# Patient Record
Sex: Female | Born: 1960 | Race: White | Hispanic: No | Marital: Single | Smoking: Current every day smoker
Health system: Southern US, Community
[De-identification: ages and names within clinical notes are randomized; demographics above are authoritative.]

## PROBLEM LIST (undated history)

## (undated) DIAGNOSIS — F1911 Other psychoactive substance abuse, in remission: Secondary | ICD-10-CM

## (undated) DIAGNOSIS — C801 Malignant (primary) neoplasm, unspecified: Secondary | ICD-10-CM

## (undated) DIAGNOSIS — F419 Anxiety disorder, unspecified: Secondary | ICD-10-CM

## (undated) DIAGNOSIS — K219 Gastro-esophageal reflux disease without esophagitis: Secondary | ICD-10-CM

## (undated) DIAGNOSIS — K5730 Diverticulosis of large intestine without perforation or abscess without bleeding: Secondary | ICD-10-CM

## (undated) DIAGNOSIS — Z86010 Personal history of colonic polyps: Secondary | ICD-10-CM

## (undated) DIAGNOSIS — F319 Bipolar disorder, unspecified: Secondary | ICD-10-CM

## (undated) DIAGNOSIS — Z8619 Personal history of other infectious and parasitic diseases: Secondary | ICD-10-CM

## (undated) DIAGNOSIS — J42 Unspecified chronic bronchitis: Secondary | ICD-10-CM

## (undated) DIAGNOSIS — F259 Schizoaffective disorder, unspecified: Secondary | ICD-10-CM

## (undated) DIAGNOSIS — F99 Mental disorder, not otherwise specified: Secondary | ICD-10-CM

## (undated) DIAGNOSIS — I639 Cerebral infarction, unspecified: Secondary | ICD-10-CM

## (undated) DIAGNOSIS — I69398 Other sequelae of cerebral infarction: Secondary | ICD-10-CM

## (undated) DIAGNOSIS — Z860101 Personal history of adenomatous and serrated colon polyps: Secondary | ICD-10-CM

## (undated) DIAGNOSIS — G894 Chronic pain syndrome: Secondary | ICD-10-CM

## (undated) DIAGNOSIS — Z8709 Personal history of other diseases of the respiratory system: Secondary | ICD-10-CM

## (undated) DIAGNOSIS — F411 Generalized anxiety disorder: Secondary | ICD-10-CM

## (undated) DIAGNOSIS — B182 Chronic viral hepatitis C: Secondary | ICD-10-CM

## (undated) DIAGNOSIS — R0600 Dyspnea, unspecified: Secondary | ICD-10-CM

## (undated) DIAGNOSIS — I69354 Hemiplegia and hemiparesis following cerebral infarction affecting left non-dominant side: Secondary | ICD-10-CM

## (undated) DIAGNOSIS — B20 Human immunodeficiency virus [HIV] disease: Secondary | ICD-10-CM

## (undated) DIAGNOSIS — N3946 Mixed incontinence: Secondary | ICD-10-CM

## (undated) DIAGNOSIS — E782 Mixed hyperlipidemia: Secondary | ICD-10-CM

## (undated) HISTORY — DX: Chronic viral hepatitis C: B18.2

## (undated) HISTORY — PX: SP ARTHRO WRIST*R*: HXRAD206

## (undated) HISTORY — DX: Unspecified chronic bronchitis: J42

## (undated) HISTORY — DX: Schizoaffective disorder, unspecified: F25.9

## (undated) HISTORY — PX: WRIST SURGERY: SHX841

## (undated) HISTORY — DX: Bipolar disorder, unspecified: F31.9

## (undated) HISTORY — DX: Malignant (primary) neoplasm, unspecified: C80.1

## (undated) HISTORY — DX: Anxiety disorder, unspecified: F41.9

## (undated) MED FILL — Alprazolam Tab 1 MG: ORAL | Fill #0 | Status: CN

## (undated) NOTE — *Deleted (*Deleted)
   CC: Hot flashes  HPI:  [name] is a 77 y.o. female with history as below significant for schizoaffective disorder, HIV, hepatitis C presenting as a new patient with complaint of hot flashes. Please refer to problem based charting for further details of assessment and plan of current problem and chronic medical conditions.  History: Surgical history: Family history: Social history: Medications:  Hot flashes Not a candidate for immunotherapy due to active hepatitis C infection.  Is already on some medications that are used for nonhormonal treatment including gabapentin and citalopram. No history of breast cancer, uterine cancer, normal uterine bleeding, coronary artery disease, VTE, stroke, liver disease  -Can I use cooling methods such as fans, air conditioning, light clothing -Avoid triggers such as spicy food, stressful situations -Vitamin E? 100 units daily  Preventive health -COVID vaccine: -Flu vaccine: -Tdap: -Pap smear: -Colonoscopy:  Past Medical History:  Diagnosis Date  . Anxiety   . Bipolar 1 disorder (HCC)   . Chronic bronchitis (HCC)   . Hepatitis C carrier (HCC)   . HIV (human immunodeficiency virus infection) (HCC) 2015  . Schizoaffective disorder (HCC)    Review of Systems:   ROS ***  Physical Exam: There were no vitals filed for this visit. Constitutional: no acute distress Head: atraumatic ENT: external ears normal Cardiovascular: regular rate and rhythm, normal heart sounds Pulmonary: effort normal, normal breath sounds bilaterally Abdominal: flat, nontender, no rebound tenderness, bowel sounds normal Musculoskeletal: *** Skin: warm and dry Neurological: alert, no focal deficit Psychiatric: normal mood and affect  Assessment & Plan:   See Encounters Tab for problem based charting.  Patient {GC/GE:[id number]::"discussed with","seen with"} Dr. {NAMES:[id number]::"Butcher","Guilloud","Hoffman","Mullen","Narendra","Raines","Vincent"}

---

## 1985-01-28 HISTORY — PX: BREAST EXCISIONAL BIOPSY: SUR124

## 1997-08-31 ENCOUNTER — Emergency Department (HOSPITAL_COMMUNITY): Admission: EM | Admit: 1997-08-31 | Discharge: 1997-08-31 | Payer: Self-pay | Admitting: Emergency Medicine

## 1997-11-08 ENCOUNTER — Encounter: Payer: Self-pay | Admitting: Emergency Medicine

## 1997-11-08 ENCOUNTER — Emergency Department (HOSPITAL_COMMUNITY): Admission: EM | Admit: 1997-11-08 | Discharge: 1997-11-08 | Payer: Self-pay | Admitting: Emergency Medicine

## 2004-12-01 ENCOUNTER — Emergency Department (HOSPITAL_COMMUNITY): Admission: EM | Admit: 2004-12-01 | Discharge: 2004-12-01 | Payer: Self-pay | Admitting: Emergency Medicine

## 2013-01-28 DIAGNOSIS — B20 Human immunodeficiency virus [HIV] disease: Secondary | ICD-10-CM

## 2013-01-28 DIAGNOSIS — K219 Gastro-esophageal reflux disease without esophagitis: Secondary | ICD-10-CM | POA: Diagnosis present

## 2013-01-28 DIAGNOSIS — K208 Other esophagitis without bleeding: Secondary | ICD-10-CM | POA: Diagnosis present

## 2013-01-28 DIAGNOSIS — R4781 Slurred speech: Secondary | ICD-10-CM | POA: Diagnosis present

## 2013-01-28 HISTORY — DX: Human immunodeficiency virus (HIV) disease: B20

## 2014-01-28 DIAGNOSIS — B20 Human immunodeficiency virus [HIV] disease: Secondary | ICD-10-CM

## 2014-01-28 HISTORY — DX: Human immunodeficiency virus (HIV) disease: B20

## 2014-10-11 ENCOUNTER — Telehealth: Payer: Self-pay

## 2014-10-11 NOTE — Telephone Encounter (Signed)
Eliquis approved through 10/10/2015. [id number]. Pharmacy notified.

## 2019-04-20 ENCOUNTER — Telehealth: Payer: Self-pay | Admitting: Pharmacy Technician

## 2019-04-20 ENCOUNTER — Encounter: Payer: Self-pay | Admitting: Infectious Diseases

## 2019-04-20 ENCOUNTER — Ambulatory Visit (INDEPENDENT_AMBULATORY_CARE_PROVIDER_SITE_OTHER): Payer: Self-pay | Admitting: Infectious Diseases

## 2019-04-20 ENCOUNTER — Other Ambulatory Visit: Payer: Self-pay

## 2019-04-20 VITALS — BP 131/85 | HR 55 | Temp 98.5°F

## 2019-04-20 DIAGNOSIS — B20 Human immunodeficiency virus [HIV] disease: Secondary | ICD-10-CM | POA: Diagnosis present

## 2019-04-20 DIAGNOSIS — Z720 Tobacco use: Secondary | ICD-10-CM | POA: Insufficient documentation

## 2019-04-20 DIAGNOSIS — B182 Chronic viral hepatitis C: Secondary | ICD-10-CM

## 2019-04-20 DIAGNOSIS — F250 Schizoaffective disorder, bipolar type: Secondary | ICD-10-CM | POA: Insufficient documentation

## 2019-04-20 DIAGNOSIS — Z789 Other specified health status: Secondary | ICD-10-CM

## 2019-04-20 DIAGNOSIS — Z113 Encounter for screening for infections with a predominantly sexual mode of transmission: Secondary | ICD-10-CM

## 2019-04-20 DIAGNOSIS — Z8619 Personal history of other infectious and parasitic diseases: Secondary | ICD-10-CM | POA: Insufficient documentation

## 2019-04-20 DIAGNOSIS — Z21 Asymptomatic human immunodeficiency virus [HIV] infection status: Secondary | ICD-10-CM | POA: Diagnosis present

## 2019-04-20 DIAGNOSIS — Z79899 Other long term (current) drug therapy: Secondary | ICD-10-CM

## 2019-04-20 DIAGNOSIS — F259 Schizoaffective disorder, unspecified: Secondary | ICD-10-CM | POA: Insufficient documentation

## 2019-04-20 HISTORY — DX: Other specified health status: Z78.9

## 2019-04-20 NOTE — Assessment & Plan Note (Signed)
Encouraged to quit Will continue to encourage to quit Will be difficult with her underlying mental health issues.

## 2019-04-20 NOTE — Progress Notes (Signed)
   Subjective:    Patient ID: Mackenzie Gonzalez, female    DOB: 11/30/1960, 59 y.o.   MRN: [id number]  HPI 59 yo F with hx of HIV+ for 6 years. Was previously cared for in [place]. Had random screen, asx. No hospitalizations.   Hep C+ for 20+ years.  She is currently genvoya.  She was ordered mavyret by but has not received it.  Also off her psychotropics. (Xanax, seroquel, trileptic, neurontin, celexa). Hx of bipolar schizoaffective d/o, anxiety.   She is here from [employer], currently in [place]. Arrested March 10.   The past medical history, family history and social history were reviewed/updated in EPIC   Review of Systems  Constitutional: Positive for chills. Negative for appetite change, fever and unexpected weight change.  HENT: Negative for mouth sores.   Respiratory: Negative for cough and shortness of breath.   Gastrointestinal: Positive for diarrhea. Negative for constipation.  Genitourinary: Negative for difficulty urinating.  Neurological: Positive for headaches.  Psychiatric/Behavioral: Positive for behavioral problems and sleep disturbance. The patient is nervous/anxious.        Objective:   Physical Exam Vitals reviewed.  Constitutional:      General: She is not in acute distress.    Appearance: She is not toxic-appearing.  HENT:     Mouth/Throat:     Mouth: Mucous membranes are moist.     Pharynx: No oropharyngeal exudate.  Eyes:     Extraocular Movements: Extraocular movements intact.     Pupils: Pupils are equal, round, and reactive to light.  Cardiovascular:     Rate and Rhythm: Normal rate and regular rhythm.  Pulmonary:     Effort: Pulmonary effort is normal.     Breath sounds: Normal breath sounds.  Abdominal:     General: Bowel sounds are normal. There is no distension.     Palpations: Abdomen is soft.     Tenderness: There is no abdominal tenderness.  Musculoskeletal:     Cervical back: Normal range of motion and neck supple.     Right  lower leg: No edema.     Left lower leg: No edema.  Lymphadenopathy:     Cervical: No cervical adenopathy.  Neurological:     General: No focal deficit present.     Mental Status: She is alert.  Psychiatric:        Attention and Perception: Attention and perception normal.        Mood and Affect: Mood is anxious. Affect is not blunt or angry.           Assessment & Plan:

## 2019-04-20 NOTE — Assessment & Plan Note (Signed)
She was treated with mavyret in 2019.  Will recheck her RNA.  Check [place]

## 2019-04-20 NOTE — Assessment & Plan Note (Signed)
Will refer to psychiatry

## 2019-04-20 NOTE — Telephone Encounter (Signed)
[facility] Patient Advocate Encounter ° °Insurance verification completed.   ° °The patient is uninsured and will need patient assistance for medication. ° °We can complete the application and will need to meet with the patient for signatures and income documentation. ° °Shailyn Weyandt E. Issai Werling, CPhT °Specialty Pharmacy Patient Advocate °[facility] °Phone: [phone] °Fax:  [phone] ° ° °

## 2019-04-20 NOTE — Assessment & Plan Note (Signed)
>>  ASSESSMENT AND PLAN FOR HIV DISEASE WRITTEN ON 04/20/2019  2:48 PM BY HATCHER, JEFFREY C, MD  She's on genvoya Her CD4 was 758 (08-2018) and her HIV RNA was 210 (2021). She had naive genotype at that time.  Will repeat her labs.  Defer condoms She states she has had her vax Encouraged her to get [place].  Will continue genvoya.  Will see her back in 1 month Needs pap-mammo

## 2019-04-20 NOTE — Assessment & Plan Note (Signed)
She's on genvoya Her CD4 was 758 (08-2018) and her HIV RNA was 210 (2021). She had naive genotype at that time.  Will repeat her labs.  Defer condoms She states she has had her vax Encouraged her to get [place].  Will continue genvoya.  Will see her back in 1 month Needs pap-mammo

## 2019-04-21 LAB — URINE CYTOLOGY ANCILLARY ONLY
Chlamydia: NEGATIVE
Comment: NEGATIVE
Comment: NORMAL
Neisseria Gonorrhea: NEGATIVE

## 2019-04-21 LAB — T-HELPER CELL (CD4) - (RCID CLINIC ONLY)
CD4 % Helper T Cell: 40 % (ref 33–65)
CD4 T Cell Abs: 715 /uL (ref 400–1790)

## 2019-04-30 LAB — QUANTIFERON-TB GOLD PLUS
Mitogen-NIL: >10 IU/mL
NIL: 0.04 IU/mL
QuantiFERON-TB Gold Plus: NEGATIVE
TB1-NIL: 0.05 IU/mL
TB2-NIL: 0.03 IU/mL

## 2019-04-30 LAB — COMPREHENSIVE METABOLIC PANEL
AG Ratio: 1.6 (calc) (ref 1.0–2.5)
ALT: 72 U/L — ABNORMAL HIGH (ref 6–29)
AST: 53 U/L — ABNORMAL HIGH (ref 10–35)
Albumin: 4.2 g/dL (ref 3.6–5.1)
Alkaline phosphatase (APISO): 70 U/L (ref 37–153)
BUN: 11 mg/dL (ref 7–25)
CO2: 28 mmol/L (ref 20–32)
Calcium: 10 mg/dL (ref 8.6–10.4)
Chloride: 106 mmol/L (ref 98–110)
Creat: 0.69 mg/dL (ref 0.50–1.05)
Globulin: 2.7 g/dL (calc) (ref 1.9–3.7)
Glucose, Bld: 88 mg/dL (ref 65–99)
Potassium: 4.3 mmol/L (ref 3.5–5.3)
Sodium: 141 mmol/L (ref 135–146)
Total Bilirubin: 0.8 mg/dL (ref 0.2–1.2)
Total Protein: 6.9 g/dL (ref 6.1–8.1)

## 2019-04-30 LAB — CBC
HCT: 40 % (ref 35.0–45.0)
Hemoglobin: 13.2 g/dL (ref 11.7–15.5)
MCH: 32.7 pg (ref 27.0–33.0)
MCHC: 33 g/dL (ref 32.0–36.0)
MCV: 99 fL (ref 80.0–100.0)
MPV: 12 fL (ref 7.5–12.5)
Platelets: 183 10*3/uL (ref 140–400)
RBC: 4.04 10*6/uL (ref 3.80–5.10)
RDW: 12.2 % (ref 11.0–15.0)
WBC: 5 10*3/uL (ref 3.8–10.8)

## 2019-04-30 LAB — LIPID PANEL
Cholesterol: 158 mg/dL (ref <200)
HDL: 40 mg/dL — ABNORMAL LOW (ref >=50)
LDL Cholesterol (Calc): 100 mg/dL (calc) — ABNORMAL HIGH
Non-HDL Cholesterol (Calc): 118 mg/dL (calc) (ref <130)
Total CHOL/HDL Ratio: 4 (calc) (ref <5.0)
Triglycerides: 89 mg/dL (ref <150)

## 2019-04-30 LAB — HEPATITIS C RNA QUANTITATIVE
HCV Quantitative Log: 5.95 Log IU/mL — ABNORMAL HIGH
HCV RNA, PCR, QN: 890000 IU/mL — ABNORMAL HIGH

## 2019-04-30 LAB — HIV-1 RNA ULTRAQUANT REFLEX TO GENTYP+
HIV 1 RNA Quant: 4030 copies/mL — ABNORMAL HIGH
HIV-1 RNA Quant, Log: 3.61 Log copies/mL — ABNORMAL HIGH

## 2019-04-30 LAB — HEPATITIS C GENOTYPE

## 2019-04-30 LAB — HIV-1 INTEGRASE GENOTYPE

## 2019-04-30 LAB — HIV-1 GENOTYPE: HIV-1 Genotype: DETECTED — AB

## 2019-04-30 LAB — HLA B*5701: HLA-B*5701 w/rflx HLA-B High: NEGATIVE

## 2019-04-30 LAB — RPR: RPR Ser Ql: NONREACTIVE

## 2019-05-03 ENCOUNTER — Encounter: Payer: Self-pay | Admitting: Infectious Diseases

## 2019-05-04 ENCOUNTER — Telehealth: Payer: Self-pay

## 2019-05-04 NOTE — Telephone Encounter (Signed)
Attempted to call patient to provide lab results and set up follow up appointment, however number listed in her chart is not correct. Patient's MyChart is not activated. Provider notified.   Shervon Kerwin Lorita Officer, RN

## 2019-05-18 ENCOUNTER — Emergency Department (HOSPITAL_COMMUNITY)
Admission: EM | Admit: 2019-05-18 | Discharge: 2019-05-18 | Discharge status: Against Medical Advice | Payer: Medicaid Other | Attending: Emergency Medicine | Admitting: Emergency Medicine

## 2019-05-18 ENCOUNTER — Telehealth: Payer: Self-pay | Admitting: Surgery

## 2019-05-18 ENCOUNTER — Encounter (HOSPITAL_COMMUNITY): Payer: Self-pay

## 2019-05-18 ENCOUNTER — Emergency Department (HOSPITAL_COMMUNITY): Payer: Medicaid Other

## 2019-05-18 ENCOUNTER — Other Ambulatory Visit: Payer: Self-pay

## 2019-05-18 DIAGNOSIS — Z79899 Other long term (current) drug therapy: Secondary | ICD-10-CM | POA: Diagnosis not present

## 2019-05-18 DIAGNOSIS — M79645 Pain in left finger(s): Secondary | ICD-10-CM | POA: Insufficient documentation

## 2019-05-18 DIAGNOSIS — B20 Human immunodeficiency virus [HIV] disease: Secondary | ICD-10-CM | POA: Diagnosis not present

## 2019-05-18 DIAGNOSIS — F17210 Nicotine dependence, cigarettes, uncomplicated: Secondary | ICD-10-CM | POA: Insufficient documentation

## 2019-05-18 DIAGNOSIS — R6889 Other general symptoms and signs: Secondary | ICD-10-CM

## 2019-05-18 DIAGNOSIS — R1084 Generalized abdominal pain: Secondary | ICD-10-CM | POA: Diagnosis not present

## 2019-05-18 DIAGNOSIS — K625 Hemorrhage of anus and rectum: Secondary | ICD-10-CM | POA: Diagnosis not present

## 2019-05-18 LAB — COMPREHENSIVE METABOLIC PANEL
ALT: 84 U/L — ABNORMAL HIGH (ref 0–44)
AST: 67 U/L — ABNORMAL HIGH (ref 15–41)
Albumin: 3.8 g/dL (ref 3.5–5.0)
Alkaline Phosphatase: 83 U/L (ref 38–126)
Anion gap: 9 (ref 5–15)
BUN: 14 mg/dL (ref 6–20)
CO2: 23 mmol/L (ref 22–32)
Calcium: 9.3 mg/dL (ref 8.9–10.3)
Chloride: 108 mmol/L (ref 98–111)
Creatinine, Ser: 0.89 mg/dL (ref 0.44–1.00)
GFR calc Af Amer: >60 mL/min (ref >60)
GFR calc non Af Amer: >60 mL/min (ref >60)
Glucose, Bld: 98 mg/dL (ref 70–99)
Potassium: 4.2 mmol/L (ref 3.5–5.1)
Sodium: 140 mmol/L (ref 135–145)
Total Bilirubin: 0.7 mg/dL (ref 0.3–1.2)
Total Protein: 7 g/dL (ref 6.5–8.1)

## 2019-05-18 LAB — ETHANOL: Alcohol, Ethyl (B): <10 mg/dL (ref <10)

## 2019-05-18 LAB — RAPID URINE DRUG SCREEN, HOSP PERFORMED
Amphetamines: NOT DETECTED
Barbiturates: NOT DETECTED
Benzodiazepines: POSITIVE — AB
Cocaine: NOT DETECTED
Opiates: NOT DETECTED
Tetrahydrocannabinol: POSITIVE — AB

## 2019-05-18 LAB — CBC
HCT: 41.5 % (ref 36.0–46.0)
Hemoglobin: 13.3 g/dL (ref 12.0–15.0)
MCH: 32.7 pg (ref 26.0–34.0)
MCHC: 32 g/dL (ref 30.0–36.0)
MCV: 102 fL — ABNORMAL HIGH (ref 80.0–100.0)
Platelets: 188 10*3/uL (ref 150–400)
RBC: 4.07 MIL/uL (ref 3.87–5.11)
RDW: 13.5 % (ref 11.5–15.5)
WBC: 5.5 10*3/uL (ref 4.0–10.5)
nRBC: 0 % (ref 0.0–0.2)

## 2019-05-18 LAB — ACETAMINOPHEN LEVEL: Acetaminophen (Tylenol), Serum: <10 ug/mL — ABNORMAL LOW (ref 10–30)

## 2019-05-18 LAB — POC OCCULT BLOOD, ED: Fecal Occult Bld: NEGATIVE

## 2019-05-18 LAB — SALICYLATE LEVEL: Salicylate Lvl: <7 mg/dL — ABNORMAL LOW (ref 7.0–30.0)

## 2019-05-18 MED ORDER — NICOTINE 21 MG/24HR TD PT24
21.0000 mg | MEDICATED_PATCH | Freq: Once | TRANSDERMAL | Status: DC
  Administered 2019-05-18: 17:00:00 21 mg via TRANSDERMAL
  Filled 2019-05-18: qty 1
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
## 2019-05-18 NOTE — ED Notes (Signed)
Pt walked out AMA.

## 2019-05-18 NOTE — Progress Notes (Signed)
CSW provided resources for [facility], and [employer]. CSW also sent email to NP Edwards at [facility] providing update.

## 2019-05-18 NOTE — ED Triage Notes (Signed)
Pt reports generalized body pain for a while now, HIV positive. Pt also reports bright red rectal bleeding that started this morning, states she does have a hemorrhoid. Pt also requesting an xray of her left hand and face, no deformities noted
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
## 2019-05-18 NOTE — ED Notes (Signed)
Pt left AMA. Several attempts made to keep patient until pt was able to be discharged. Writer spoke w/ [place] who states that pt does not meet inpatient criteria. Social work in room and pt provided w/ resources for psych and PCP. Pt states she is leaving. Pt is AOx4, no IVC order in place.
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
## 2019-05-18 NOTE — Telephone Encounter (Signed)
ED CM called [facility] to assist with scheduling an OP BH F/U. CM attempted to contact patient concerning setting up a follow up appointment no answer and no VM set up.  Will make second attempt tomorrow

## 2019-05-18 NOTE — Social Work (Signed)
CSW was able to make appointment for Pt at [facility] for Wednesday 12 at 2:30 pm.

## 2019-05-18 NOTE — ED Notes (Signed)
Pt returned to room. This tech, RN, and CSW spoke with the pt about discharging her soon. Pt was informed that provider will see them soon.

## 2019-05-20 NOTE — Telephone Encounter (Signed)
Patient called office back regarding missed call from RN. Will send message to MD to confirm lab results and message for patient.  [place]

## 2019-05-20 NOTE — Telephone Encounter (Signed)
Per Md patient will need to schedule one month follow up. [place]

## 2019-05-25 ENCOUNTER — Ambulatory Visit (INDEPENDENT_AMBULATORY_CARE_PROVIDER_SITE_OTHER): Payer: Self-pay | Admitting: Infectious Diseases

## 2019-05-25 ENCOUNTER — Other Ambulatory Visit: Payer: Self-pay

## 2019-05-25 ENCOUNTER — Encounter: Payer: Self-pay | Admitting: Infectious Diseases

## 2019-05-25 DIAGNOSIS — Z720 Tobacco use: Secondary | ICD-10-CM

## 2019-05-25 DIAGNOSIS — F250 Schizoaffective disorder, bipolar type: Secondary | ICD-10-CM

## 2019-05-25 DIAGNOSIS — G8929 Other chronic pain: Secondary | ICD-10-CM | POA: Insufficient documentation

## 2019-05-25 DIAGNOSIS — G894 Chronic pain syndrome: Secondary | ICD-10-CM

## 2019-05-25 DIAGNOSIS — B20 Human immunodeficiency virus [HIV] disease: Secondary | ICD-10-CM

## 2019-05-25 DIAGNOSIS — B182 Chronic viral hepatitis C: Secondary | ICD-10-CM

## 2019-05-25 MED ORDER — ELVITEG-COBIC-EMTRICIT-TENOFAF 150-150-200-10 MG PO TABS: 1.0000 | ORAL_TABLET | Freq: Every day | ORAL | 3 refills | Status: DC

## 2019-05-25 NOTE — Assessment & Plan Note (Signed)
Encouraged to quit. 

## 2019-05-25 NOTE — Assessment & Plan Note (Signed)
Will await her getting her ADAP Restart her on genvoya when ADAP comes through.  Will get her connected to [facility]- for HOPWA.  Offered/refused condoms.  rtc in 1 month.

## 2019-05-25 NOTE — Progress Notes (Signed)
   Subjective:    Patient ID: Mackenzie Gonzalez, female    DOB: 1960/11/03, 59 y.o.   MRN: [id number]  HPI 59 yo F with hx of HIV+ for 6 years. Was previously cared for in [place]. Had random screen, asx. No hospitalizations.   Hep C+ for 20+ years.  She is currently genvoya.  She was ordered mavyret by but has not received it.  Also off her psychotropics. (Xanax, seroquel, trileptic, neurontin, celexa). Hx of bipolar schizoaffective d/o, anxiety.   She is now out of jail, living with cousin (making a "bedroom out of a shack").   Is out of all her meds for last 3 months. Has met with Juliann Pulse to get RW started.   HIV 1 RNA Quant (copies/mL)  Date Value  04/20/2019 4,030 (H)   CD4 T Cell Abs (/uL)  Date Value  04/20/2019 715    Review of Systems  Constitutional: Negative for appetite change and unexpected weight change.  Gastrointestinal: Positive for diarrhea, nausea and vomiting. Negative for constipation.  Musculoskeletal: Positive for arthralgias, back pain and neck pain.  Neurological: Positive for headaches.  Psychiatric/Behavioral: Positive for dysphoric mood and sleep disturbance.  Please see HPI. All other systems reviewed and negative.      Objective:   Physical Exam Constitutional:      Appearance: Normal appearance.  HENT:     Mouth/Throat:     Mouth: Mucous membranes are moist.     Pharynx: No oropharyngeal exudate.  Eyes:     Extraocular Movements: Extraocular movements intact.     Pupils: Pupils are equal, round, and reactive to light.  Cardiovascular:     Rate and Rhythm: Normal rate and regular rhythm.  Pulmonary:     Effort: Pulmonary effort is normal.     Breath sounds: Normal breath sounds.  Abdominal:     General: Bowel sounds are normal. There is no distension.     Palpations: Abdomen is soft.     Tenderness: There is no abdominal tenderness.  Musculoskeletal:     Cervical back: Normal range of motion and neck supple.  Neurological:     General: No  focal deficit present.     Mental Status: She is alert.  Psychiatric:        Speech: Speech is rapid and pressured.           Assessment & Plan:

## 2019-05-25 NOTE — Assessment & Plan Note (Signed)
Will get her connected to pharmacy for rx [facility]?)

## 2019-05-25 NOTE — Assessment & Plan Note (Signed)
Let her know that we don't write pain rx.  Will get her into pain clinic at f/u.

## 2019-05-25 NOTE — Assessment & Plan Note (Signed)
>>  ASSESSMENT AND PLAN FOR HIV DISEASE WRITTEN ON 05/25/2019  4:51 PM BY HATCHER, JEFFREY C, MD  Will await her getting her ADAP Restart her on genvoya when ADAP comes through.  Will get her connected to [facility]- for HOPWA.  Offered/refused condoms.  rtc in 1 month.

## 2019-05-25 NOTE — Assessment & Plan Note (Signed)
Will get her in with psychiatry

## 2019-05-28 ENCOUNTER — Other Ambulatory Visit: Payer: Self-pay | Admitting: Family

## 2019-05-28 ENCOUNTER — Telehealth: Payer: Self-pay | Admitting: Pharmacy Technician

## 2019-05-28 DIAGNOSIS — B20 Human immunodeficiency virus [HIV] disease: Secondary | ICD-10-CM

## 2019-05-28 MED ORDER — ELVITEG-COBIC-EMTRICIT-TENOFAF 150-150-200-10 MG PO TABS: 1.0000 | ORAL_TABLET | Freq: Every day | ORAL | 3 refills | Status: DC

## 2019-05-28 NOTE — Telephone Encounter (Addendum)
[facility] Patient Advocate Encounter   Patient has been approved for [employer] Advancing Access Patient Assistance Program for [facility] for up to 80-months coverage beginning 05/28/2019. This assistance will make the patient's copay $0.  The billing information is as follows and has been shared with [facility] for a one time fill pending adap. She previously had [place] Medicaid and applying for [place] Medicaid.    Patient knows to call the office with questions or concerns.  Bartholomew Crews, CPhT Specialty Pharmacy Patient [facility] for Infectious Disease Phone: [phone] Fax: [phone] 05/28/2019 10:22 AM

## 2019-06-07 ENCOUNTER — Other Ambulatory Visit: Payer: Self-pay | Admitting: Infectious Diseases

## 2019-06-07 DIAGNOSIS — Z1231 Encounter for screening mammogram for malignant neoplasm of breast: Secondary | ICD-10-CM

## 2019-06-09 ENCOUNTER — Other Ambulatory Visit: Payer: Self-pay

## 2019-06-09 ENCOUNTER — Telehealth (INDEPENDENT_AMBULATORY_CARE_PROVIDER_SITE_OTHER): Payer: Medicaid - Out of State | Admitting: Primary Care

## 2019-06-09 DIAGNOSIS — B20 Human immunodeficiency virus [HIV] disease: Secondary | ICD-10-CM

## 2019-06-09 DIAGNOSIS — F250 Schizoaffective disorder, bipolar type: Secondary | ICD-10-CM

## 2019-06-09 DIAGNOSIS — Z7689 Persons encountering health services in other specified circumstances: Secondary | ICD-10-CM

## 2019-06-09 DIAGNOSIS — G894 Chronic pain syndrome: Secondary | ICD-10-CM | POA: Diagnosis not present

## 2019-06-09 DIAGNOSIS — Z720 Tobacco use: Secondary | ICD-10-CM | POA: Diagnosis not present
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
## 2019-06-15 ENCOUNTER — Ambulatory Visit: Payer: Medicaid - Out of State

## 2019-06-16 ENCOUNTER — Telehealth (INDEPENDENT_AMBULATORY_CARE_PROVIDER_SITE_OTHER): Payer: Self-pay

## 2019-06-16 ENCOUNTER — Telehealth: Payer: Self-pay | Admitting: Pharmacist

## 2019-06-16 DIAGNOSIS — B182 Chronic viral hepatitis C: Secondary | ICD-10-CM

## 2019-06-16 MED ORDER — MAVYRET 100-40 MG PO TABS: 3.0000 | ORAL_TABLET | Freq: Every day | ORAL | 1 refills | Status: DC

## 2019-06-16 NOTE — Telephone Encounter (Signed)
Patient called to request a referral to a kidney specialty. Patient states she was seen in [place] for Renal insufficiency. Patient states she was seen by a kindey dr before to moved to [facility] but her blood pressure was high and had to reschedule her appointment. Patient stated she will try to find her records to bring to her PCP. Patient was advice if she could not find her records she would need to come in to the clinic to sign a medical release of information form. Please send referral is appropriate.   Please advice [phone]

## 2019-06-16 NOTE — Telephone Encounter (Signed)
Called patient to counsel on new Hepatitis C medication, Mavyret. This was sent in to [facility] in [place]. It is on the [place], so she should be able to get it through them. Patient did not answer and no VM set up. She has a follow up appointment scheduled with Dr. Johnnye Sima on Tuesday 5/25.

## 2019-06-16 NOTE — Telephone Encounter (Signed)
Sent to PCP ?

## 2019-06-17 NOTE — Telephone Encounter (Signed)
Thanks

## 2019-06-18 ENCOUNTER — Inpatient Hospital Stay: Admission: RE | Admit: 2019-06-18 | Payer: Medicaid - Out of State | Source: Ambulatory Visit

## 2019-06-22 ENCOUNTER — Ambulatory Visit: Payer: Medicaid - Out of State | Admitting: Infectious Diseases

## 2019-06-22 NOTE — Telephone Encounter (Signed)
Seen in emergency room 04/2019 kidney function normal . No referral needed

## 2019-06-23 ENCOUNTER — Telehealth (INDEPENDENT_AMBULATORY_CARE_PROVIDER_SITE_OTHER): Payer: Self-pay

## 2019-06-23 ENCOUNTER — Telehealth: Payer: Self-pay | Admitting: Pharmacy Technician

## 2019-06-23 NOTE — Telephone Encounter (Signed)
Left message asking patient to return call to [facility] at [phone].  

## 2019-06-23 NOTE — Telephone Encounter (Addendum)
[facility] Patient Advocate Encounter   Anda Kraft with [place] [phone]) called to let us know Ms. Korman now has [place] Medicaid coverage and needs a prior authorization approval for [place].  [place] Medicaid requires two things that we do not currently have for Ms. [place], a completed Readiness to Treat form and labs for a fibrosis score.  Once we have those, we can proceed to file for approval.  I left a HIPPA compliant voicemail asking her to call 06/23/2019 and tried again 06/29/2019.    [facility] Clinic will continue to follow.   Venida Jarvis. Nadara Mustard [place] Patient [facility] for Infectious Disease Phone: [phone] Fax:  [phone]

## 2019-06-23 NOTE — Telephone Encounter (Signed)
Sent to PCP ?

## 2019-06-23 NOTE — Telephone Encounter (Signed)
Patient is requesting to have bone density exam states she feels like her bone are deterring. States she does not know if this has to do with any of her medication.   Please advice [phone]

## 2019-06-24 NOTE — Telephone Encounter (Signed)
Patient is aware that referral to nephrologist no needed as kidney function normal.

## 2019-06-25 ENCOUNTER — Encounter (INDEPENDENT_AMBULATORY_CARE_PROVIDER_SITE_OTHER): Payer: Self-pay | Admitting: Primary Care

## 2019-06-25 ENCOUNTER — Other Ambulatory Visit (INDEPENDENT_AMBULATORY_CARE_PROVIDER_SITE_OTHER): Payer: Self-pay | Admitting: Primary Care

## 2019-06-25 DIAGNOSIS — Z780 Asymptomatic menopausal state: Secondary | ICD-10-CM

## 2019-06-29 ENCOUNTER — Ambulatory Visit: Payer: Medicaid Other

## 2019-07-08 ENCOUNTER — Ambulatory Visit (HOSPITAL_COMMUNITY): Payer: Medicaid Other | Admitting: Licensed Clinical Social Worker

## 2019-07-08 ENCOUNTER — Other Ambulatory Visit: Payer: Self-pay

## 2019-07-11 NOTE — Telephone Encounter (Signed)
Anda Kraft from [place] called back, I informed her that we have not heard back from the patient yet and still waiting to get the readiness form completed. She was under the impression that the new nctracks just does an attestation that the patient is ready in the question under what type of lab and that the form did not need to be completed.

## 2019-07-12 ENCOUNTER — Encounter (HOSPITAL_COMMUNITY): Payer: Self-pay | Admitting: Psychiatry

## 2019-07-12 ENCOUNTER — Other Ambulatory Visit: Payer: Self-pay

## 2019-07-12 ENCOUNTER — Telehealth (INDEPENDENT_AMBULATORY_CARE_PROVIDER_SITE_OTHER): Payer: Medicaid Other | Admitting: Psychiatry

## 2019-07-12 ENCOUNTER — Ambulatory Visit (HOSPITAL_COMMUNITY): Payer: Medicaid Other | Admitting: Psychiatry

## 2019-07-12 DIAGNOSIS — F250 Schizoaffective disorder, bipolar type: Secondary | ICD-10-CM

## 2019-07-12 DIAGNOSIS — F419 Anxiety disorder, unspecified: Secondary | ICD-10-CM

## 2019-07-12 DIAGNOSIS — F4310 Post-traumatic stress disorder, unspecified: Secondary | ICD-10-CM | POA: Insufficient documentation

## 2019-07-12 MED ORDER — ALPRAZOLAM 1 MG PO TABS: 1.0000 mg | ORAL_TABLET | Freq: Two times a day (BID) | ORAL | 1 refills | Status: DC

## 2019-07-12 MED ORDER — QUETIAPINE FUMARATE 300 MG PO TABS: 300.0000 mg | ORAL_TABLET | Freq: Every day | ORAL | 1 refills | Status: DC

## 2019-07-12 MED ORDER — CITALOPRAM HYDROBROMIDE 20 MG PO TABS: 20.0000 mg | ORAL_TABLET | Freq: Every day | ORAL | 1 refills | Status: DC

## 2019-07-12 MED ORDER — GABAPENTIN 400 MG PO CAPS: 400.0000 mg | ORAL_CAPSULE | Freq: Three times a day (TID) | ORAL | 1 refills | Status: DC
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
## 2019-07-21 ENCOUNTER — Telehealth: Payer: Self-pay | Admitting: Pharmacy Technician

## 2019-07-21 NOTE — Telephone Encounter (Signed)
[facility] Patient Advocate Encounter   Tried to reach the patient 06/23/2019, 6/1/2021and 07/21/2019.  No answer, no voicemail.  We must have her come in to sign a readiness to treat form and for lab work to get a fibrosis score for M[place] medication approval to treat Hepatitis C  Jurgen Groeneveld E. Nadara Mustard [facility] Phone: [phone] Fax:  [phone]

## 2019-07-26 ENCOUNTER — Ambulatory Visit (HOSPITAL_COMMUNITY)
Admission: EM | Admit: 2019-07-26 | Discharge: 2019-07-27 | Disposition: A | Discharge status: Home / Self Care | Payer: Medicaid Other | Attending: Nurse Practitioner | Admitting: Nurse Practitioner

## 2019-07-26 ENCOUNTER — Other Ambulatory Visit: Payer: Self-pay

## 2019-07-26 DIAGNOSIS — Z811 Family history of alcohol abuse and dependence: Secondary | ICD-10-CM | POA: Insufficient documentation

## 2019-07-26 DIAGNOSIS — Z79899 Other long term (current) drug therapy: Secondary | ICD-10-CM | POA: Insufficient documentation

## 2019-07-26 DIAGNOSIS — F419 Anxiety disorder, unspecified: Secondary | ICD-10-CM | POA: Diagnosis not present

## 2019-07-26 DIAGNOSIS — F259 Schizoaffective disorder, unspecified: Secondary | ICD-10-CM | POA: Diagnosis not present

## 2019-07-26 DIAGNOSIS — Z21 Asymptomatic human immunodeficiency virus [HIV] infection status: Secondary | ICD-10-CM | POA: Insufficient documentation

## 2019-07-26 DIAGNOSIS — F17210 Nicotine dependence, cigarettes, uncomplicated: Secondary | ICD-10-CM | POA: Insufficient documentation

## 2019-07-26 DIAGNOSIS — Z818 Family history of other mental and behavioral disorders: Secondary | ICD-10-CM | POA: Insufficient documentation

## 2019-07-26 DIAGNOSIS — Z20822 Contact with and (suspected) exposure to covid-19: Secondary | ICD-10-CM | POA: Insufficient documentation

## 2019-07-26 LAB — CBC WITH DIFFERENTIAL/PLATELET
Abs Immature Granulocytes: 0.01 10*3/uL (ref 0.00–0.07)
Basophils Absolute: 0 10*3/uL (ref 0.0–0.1)
Basophils Relative: 1 %
Eosinophils Absolute: 0.2 10*3/uL (ref 0.0–0.5)
Eosinophils Relative: 4 %
HCT: 39.3 % (ref 36.0–46.0)
Hemoglobin: 13.2 g/dL (ref 12.0–15.0)
Immature Granulocytes: 0 %
Lymphocytes Relative: 31 %
Lymphs Abs: 1.5 10*3/uL (ref 0.7–4.0)
MCH: 32.7 pg (ref 26.0–34.0)
MCHC: 33.6 g/dL (ref 30.0–36.0)
MCV: 97.3 fL (ref 80.0–100.0)
Monocytes Absolute: 0.4 10*3/uL (ref 0.1–1.0)
Monocytes Relative: 9 %
Neutro Abs: 2.7 10*3/uL (ref 1.7–7.7)
Neutrophils Relative %: 55 %
Platelets: 174 10*3/uL (ref 150–400)
RBC: 4.04 MIL/uL (ref 3.87–5.11)
RDW: 14.2 % (ref 11.5–15.5)
WBC: 5 10*3/uL (ref 4.0–10.5)
nRBC: 0 % (ref 0.0–0.2)

## 2019-07-26 LAB — COMPREHENSIVE METABOLIC PANEL
ALT: 69 U/L — ABNORMAL HIGH (ref 0–44)
AST: 43 U/L — ABNORMAL HIGH (ref 15–41)
Albumin: 3.8 g/dL (ref 3.5–5.0)
Alkaline Phosphatase: 72 U/L (ref 38–126)
Anion gap: 10 (ref 5–15)
BUN: 12 mg/dL (ref 6–20)
CO2: 22 mmol/L (ref 22–32)
Calcium: 9.4 mg/dL (ref 8.9–10.3)
Chloride: 108 mmol/L (ref 98–111)
Creatinine, Ser: 0.65 mg/dL (ref 0.44–1.00)
GFR calc Af Amer: >60 mL/min (ref >60)
GFR calc non Af Amer: >60 mL/min (ref >60)
Glucose, Bld: 90 mg/dL (ref 70–99)
Potassium: 4.1 mmol/L (ref 3.5–5.1)
Sodium: 140 mmol/L (ref 135–145)
Total Bilirubin: 0.8 mg/dL (ref 0.3–1.2)
Total Protein: 6.9 g/dL (ref 6.5–8.1)

## 2019-07-26 LAB — HEMOGLOBIN A1C
Hgb A1c MFr Bld: 5.3 % (ref 4.8–5.6)
Mean Plasma Glucose: 105.41 mg/dL

## 2019-07-26 LAB — LIPID PANEL
Cholesterol: 193 mg/dL (ref 0–200)
HDL: 55 mg/dL (ref >40)
LDL Cholesterol: 121 mg/dL — ABNORMAL HIGH (ref 0–99)
Total CHOL/HDL Ratio: 3.5 RATIO
Triglycerides: 87 mg/dL (ref <150)
VLDL: 17 mg/dL (ref 0–40)

## 2019-07-26 LAB — POCT URINE DRUG SCREEN - MANUAL ENTRY (I-SCREEN)
POC Amphetamine UR: NOT DETECTED
POC Buprenorphine (BUP): NOT DETECTED
POC Cocaine UR: NOT DETECTED
POC Marijuana UR: POSITIVE — AB
POC Methadone UR: NOT DETECTED
POC Methamphetamine UR: NOT DETECTED
POC Morphine: NOT DETECTED
POC Oxazepam (BZO): NOT DETECTED
POC Oxycodone UR: NOT DETECTED
POC Secobarbital (BAR): NOT DETECTED

## 2019-07-26 LAB — POC SARS CORONAVIRUS 2 AG: SARS Coronavirus 2 Ag: NEGATIVE

## 2019-07-26 LAB — POCT PREGNANCY, URINE: Preg Test, Ur: NEGATIVE

## 2019-07-26 LAB — TSH: TSH: 1.726 u[IU]/mL (ref 0.350–4.500)

## 2019-07-26 MED ORDER — QUETIAPINE FUMARATE 200 MG PO TABS
200.0000 mg | ORAL_TABLET | Freq: Every day | ORAL | Status: DC
  Administered 2019-07-26: 200 mg via ORAL
  Filled 2019-07-26: qty 1

## 2019-07-26 MED ORDER — ALUM & MAG HYDROXIDE-SIMETH 200-200-20 MG/5ML PO SUSP: 30.0000 mL | ORAL | Status: DC | PRN

## 2019-07-26 MED ORDER — CITALOPRAM HYDROBROMIDE 10 MG PO TABS
10.0000 mg | ORAL_TABLET | Freq: Every day | ORAL | Status: DC
  Administered 2019-07-26 – 2019-07-27 (×2): 10 mg via ORAL
  Filled 2019-07-26 (×2): qty 1

## 2019-07-26 MED ORDER — ACETAMINOPHEN 325 MG PO TABS: 650.0000 mg | ORAL_TABLET | Freq: Four times a day (QID) | ORAL | Status: DC | PRN

## 2019-07-26 MED ORDER — TRAZODONE HCL 50 MG PO TABS: 50.0000 mg | ORAL_TABLET | Freq: Every evening | ORAL | Status: DC | PRN

## 2019-07-26 MED ORDER — OXCARBAZEPINE 150 MG PO TABS
150.0000 mg | ORAL_TABLET | Freq: Two times a day (BID) | ORAL | Status: DC
  Administered 2019-07-26 – 2019-07-27 (×3): 150 mg via ORAL
  Filled 2019-07-26 (×3): qty 1

## 2019-07-26 MED ORDER — ALPRAZOLAM 0.5 MG PO TABS
1.0000 mg | ORAL_TABLET | Freq: Two times a day (BID) | ORAL | Status: DC | PRN
  Administered 2019-07-26: 1 mg via ORAL
  Filled 2019-07-26: qty 2

## 2019-07-26 MED ORDER — MAGNESIUM HYDROXIDE 400 MG/5ML PO SUSP: 30.0000 mL | Freq: Every day | ORAL | Status: DC | PRN

## 2019-07-26 NOTE — ED Notes (Addendum)
Patient arrived to FLEX 139[facility] (bed 2)

## 2019-07-26 NOTE — ED Notes (Signed)
Received Mackenzie Gonzalez and placed her in Flex 2. She received a snack of her choice and was oriented to her new environment. She eventually drifted off to sleep.

## 2019-07-26 NOTE — ED Provider Notes (Signed)
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 medications, see orders Labs  ordered Admit to observation unit for overnight observation    Recommendations  Based on my evaluation the patient does not appear to have an emergency medical condition.  Lewis Shock, FNP 07/26/19  12:05 PM

## 2019-07-26 NOTE — ED Provider Notes (Signed)
Behavioral Health Medical Screening Exam  Mackenzie Gonzalez is a 59 y.o. female.  Total Time spent with patient: 30 minutes  Psychiatric Specialty Exam  Presentation  General Appearance:Casual  Eye Contact:Good  Speech:Clear and Coherent;Normal Rate  Speech Volume:Normal  Handedness:No data recorded  Mood and Affect  Mood:No data recorded Affect:No data recorded  Thought Process  Thought Processes:No data recorded Descriptions of Associations:No data recorded Orientation:No data recorded Thought Content:No data recorded Hallucinations:No data recorded Ideas of Reference:No data recorded Suicidal Thoughts:No data recorded Homicidal Thoughts:No data recorded  Sensorium  Memory:No data recorded Judgment:No data recorded Insight:No data recorded  Executive Functions  Concentration:No data recorded Attention Span:No data recorded Recall:No data recorded Fund of Knowledge:No data recorded Language:No data recorded  Psychomotor Activity  Psychomotor Activity:No data recorded  Assets  Assets:No data recorded  Sleep  Sleep:No data recorded Number of hours: No data recorded  Physical Exam: Physical Exam Vitals and nursing note reviewed.  Constitutional:      Appearance: She is well-developed.  Cardiovascular:     Rate and Rhythm: Normal rate.  Pulmonary:     Effort: Pulmonary effort is normal.  Musculoskeletal:        General: Normal range of motion.  Skin:    General: Skin is warm.  Neurological:     Mental Status: She is alert and oriented to person, place, and time.  Psychiatric:        Mood and Affect: Mood is anxious.        Speech: Speech is tangential.    Review of Systems  Constitutional: Negative.   HENT: Negative.   Eyes: Negative.   Respiratory: Negative.   Cardiovascular: Negative.   Gastrointestinal: Negative.   Genitourinary: Negative.   Musculoskeletal: Negative.   Skin: Negative.   Neurological: Negative.   Endo/Heme/Allergies:  Negative.   Psychiatric/Behavioral: Positive for depression, hallucinations and suicidal ideas. The patient is nervous/anxious.    Blood pressure 135/84, pulse 83, temperature 97.7 F (36.5 C), temperature source Temporal, resp. rate 18, height 5\' 5"  (1.651 m), weight 123 lb (55.8 kg), SpO2 98 %. Body mass index is 20.47 kg/m.  Musculoskeletal: Strength & Muscle Tone: within normal limits Gait & Station: normal Patient leans: N/A   Recommendations:  Based on my evaluation the patient does not appear to have an emergency medical condition.  [place], FNP 07/26/2019, 11:30 AM
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
## 2019-07-26 NOTE — ED Notes (Signed)
Pt resting on pull out, denies SI & AH, requesting additional blanket which is provided. Continuous monitoring in place.

## 2019-07-27 ENCOUNTER — Encounter: Payer: Self-pay | Admitting: Infectious Diseases

## 2019-07-27 ENCOUNTER — Ambulatory Visit (INDEPENDENT_AMBULATORY_CARE_PROVIDER_SITE_OTHER): Payer: Medicaid Other | Admitting: Infectious Diseases

## 2019-07-27 VITALS — Wt 181.4 lb

## 2019-07-27 DIAGNOSIS — B182 Chronic viral hepatitis C: Secondary | ICD-10-CM | POA: Diagnosis not present

## 2019-07-27 DIAGNOSIS — F250 Schizoaffective disorder, bipolar type: Secondary | ICD-10-CM

## 2019-07-27 DIAGNOSIS — B20 Human immunodeficiency virus [HIV] disease: Secondary | ICD-10-CM

## 2019-07-27 NOTE — Assessment & Plan Note (Signed)
Will get her fibrosure so we can hopefully get her Mavyret.

## 2019-07-27 NOTE — ED Notes (Signed)
Patient belongings in locker #15

## 2019-07-27 NOTE — ED Notes (Signed)
Received Mackenzie Gonzalez this AM asleep in her chair bed, she was awaken for VS, the NP and medications.

## 2019-07-27 NOTE — Progress Notes (Signed)
[name] received her discharge order, the AVS was reviewed and her questions answered. She was given requested resources and a bus token. Her personal belongings were returned and she was escorted to the front door without incident.

## 2019-07-27 NOTE — BH Assessment (Signed)
This counselor provided patient with a bus pass and directions to [facility] via [place].

## 2019-07-27 NOTE — Progress Notes (Signed)
   Subjective:    Patient ID: Mackenzie Gonzalez, female    DOB: December 26, 1960, 59 y.o.   MRN: [id number]  HPI 59 yo F with hx of HIV+ for 6 years. Was previously cared for in [place]. Had random screen, asx. No hospitalizations.   Hep C+ for 20+ years.  She is currently genvoya.  She was ordered mavyret by but has not received it.  Also off her psychotropics. (Xanax, seroquel, trileptic, neurontin, celexa). Hx of bipolar schizoaffective d/o, anxiety.   She is here from [facility], currently in [place]. Arrested March 10. She had first visit to [facility] 04-20-2019. Released April 13. She was adm to mental health center (6-28 to 07-27-19) She would like to get in to day treatment center.  She is unlcear of her psych f/u. States she only has 1 refill.  I updated her cell phone so that she can be started on her [place].   Marland Kitchen HIV 1 RNA Quant (copies/mL)  Date Value  04/20/2019 4,030 (H)   CD4 T Cell Abs (/uL)  Date Value  04/20/2019 715     Review of Systems  Constitutional: Negative for appetite change and unexpected weight change.  Gastrointestinal: Positive for constipation. Negative for diarrhea.  Genitourinary: Negative for difficulty urinating.  Psychiatric/Behavioral: Positive for behavioral problems and dysphoric mood.       Objective:   Physical Exam Constitutional:      Appearance: Normal appearance. She is obese.  HENT:     Mouth/Throat:     Mouth: Mucous membranes are moist.     Pharynx: No oropharyngeal exudate.  Eyes:     Extraocular Movements: Extraocular movements intact.     Pupils: Pupils are equal, round, and reactive to light.  Cardiovascular:     Rate and Rhythm: Normal rate and regular rhythm.  Pulmonary:     Effort: Pulmonary effort is normal.     Breath sounds: Normal breath sounds.  Abdominal:     General: Bowel sounds are normal. There is no distension.     Palpations: Abdomen is soft.     Tenderness: There is no abdominal tenderness.    Musculoskeletal:        General: Normal range of motion.     Cervical back: Normal range of motion and neck supple.  Neurological:     General: No focal deficit present.     Mental Status: She is alert.  Psychiatric:        Mood and Affect: Affect is labile.           Assessment & Plan:

## 2019-07-27 NOTE — Assessment & Plan Note (Signed)
>>  ASSESSMENT AND PLAN FOR HIV DISEASE WRITTEN ON 07/27/2019  5:28 PM BY HATCHER, JEFFREY C, MD  Will see her back in 1 month.  Will f/u her labs at that time.  She will f/u with [facility]. She meets with Lanelle Bal today. She is given food.  Will refer her to dental.  Will have Mitch f/u with her.

## 2019-07-27 NOTE — ED Provider Notes (Addendum)
  The patient was seen face-to-face by this provider; chart reviewed and consulted with Dr. Dwyane Dee on 07/27/2019  due to the patient's care. It was discussed with the Dwyane Dee the patient  The patient is alert and oriented x3, calm, challenging to arouse but cooperative, and mood-congruent, affecting evaluation.  The patient does not appear to be responding to internal stimuli, external stimuli, nor does she appear to have delusional thinking.  The patient denies auditory hallucinations, visual hallucinations, and does not appear to be preoccupied.  The patient denies suicidal ideations, homicidal ideations, and or self-harm ideations.  The patient is presenting with psychotic behaviors, but she is not exhibiting any paranoid behaviors. During an encounter with the patient, she was able to answer questions appropriately.  Will psychiatrically cleared patient for discharge at this time.  Patient will benefit from resources for shelters and [facility] for services in the community to help with stabilization and housing.

## 2019-07-27 NOTE — Assessment & Plan Note (Signed)
Will see her back in 1 month.  Will f/u her labs at that time.  She will f/u with [facility]. She meets with Lanelle Bal today. She is given food.  Will refer her to dental.  Will have Mitch f/u with her.
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
## 2019-07-27 NOTE — Discharge Instructions (Signed)
Follow up with [facility] [address]  [place], [place] [address]

## 2019-07-28 LAB — T-HELPER CELL (CD4) - (RCID CLINIC ONLY)
CD4 % Helper T Cell: 42 % (ref 33–65)
CD4 T Cell Abs: 564 /uL (ref 400–1790)

## 2019-07-30 ENCOUNTER — Emergency Department (HOSPITAL_COMMUNITY): Payer: Medicaid Other

## 2019-07-30 ENCOUNTER — Encounter (HOSPITAL_COMMUNITY): Payer: Self-pay | Admitting: Emergency Medicine

## 2019-07-30 ENCOUNTER — Other Ambulatory Visit: Payer: Self-pay

## 2019-07-30 ENCOUNTER — Emergency Department (HOSPITAL_COMMUNITY)
Admission: EM | Admit: 2019-07-30 | Discharge: 2019-07-31 | Disposition: A | Discharge status: Home / Self Care | Payer: Medicaid Other | Attending: Emergency Medicine | Admitting: Emergency Medicine

## 2019-07-30 DIAGNOSIS — F17210 Nicotine dependence, cigarettes, uncomplicated: Secondary | ICD-10-CM | POA: Diagnosis not present

## 2019-07-30 DIAGNOSIS — Z21 Asymptomatic human immunodeficiency virus [HIV] infection status: Secondary | ICD-10-CM | POA: Diagnosis not present

## 2019-07-30 DIAGNOSIS — F250 Schizoaffective disorder, bipolar type: Secondary | ICD-10-CM | POA: Insufficient documentation

## 2019-07-30 DIAGNOSIS — Z79899 Other long term (current) drug therapy: Secondary | ICD-10-CM | POA: Diagnosis not present

## 2019-07-30 DIAGNOSIS — R462 Strange and inexplicable behavior: Secondary | ICD-10-CM | POA: Diagnosis present

## 2019-07-30 DIAGNOSIS — F29 Unspecified psychosis not due to a substance or known physiological condition: Secondary | ICD-10-CM

## 2019-07-30 DIAGNOSIS — Z20822 Contact with and (suspected) exposure to covid-19: Secondary | ICD-10-CM | POA: Insufficient documentation

## 2019-07-30 LAB — COMPREHENSIVE METABOLIC PANEL
ALT: 81 U/L — ABNORMAL HIGH (ref 0–44)
AST: 62 U/L — ABNORMAL HIGH (ref 15–41)
Albumin: 3.4 g/dL — ABNORMAL LOW (ref 3.5–5.0)
Alkaline Phosphatase: 65 U/L (ref 38–126)
Anion gap: 7 (ref 5–15)
BUN: 14 mg/dL (ref 6–20)
CO2: 25 mmol/L (ref 22–32)
Calcium: 8.7 mg/dL — ABNORMAL LOW (ref 8.9–10.3)
Chloride: 108 mmol/L (ref 98–111)
Creatinine, Ser: 0.85 mg/dL (ref 0.44–1.00)
GFR calc Af Amer: >60 mL/min (ref >60)
GFR calc non Af Amer: >60 mL/min (ref >60)
Glucose, Bld: 108 mg/dL — ABNORMAL HIGH (ref 70–99)
Potassium: 4.1 mmol/L (ref 3.5–5.1)
Sodium: 140 mmol/L (ref 135–145)
Total Bilirubin: 0.8 mg/dL (ref 0.3–1.2)
Total Protein: 6.1 g/dL — ABNORMAL LOW (ref 6.5–8.1)

## 2019-07-30 LAB — URINALYSIS, ROUTINE W REFLEX MICROSCOPIC
Bilirubin Urine: NEGATIVE
Glucose, UA: NEGATIVE mg/dL
Hgb urine dipstick: NEGATIVE
Ketones, ur: NEGATIVE mg/dL
Leukocytes,Ua: NEGATIVE
Nitrite: NEGATIVE
Protein, ur: NEGATIVE mg/dL
Specific Gravity, Urine: 1.016 (ref 1.005–1.030)
pH: 7 (ref 5.0–8.0)

## 2019-07-30 LAB — CBC WITH DIFFERENTIAL/PLATELET
Abs Immature Granulocytes: 0.01 10*3/uL (ref 0.00–0.07)
Basophils Absolute: 0 10*3/uL (ref 0.0–0.1)
Basophils Relative: 1 %
Eosinophils Absolute: 0.2 10*3/uL (ref 0.0–0.5)
Eosinophils Relative: 5 %
HCT: 39 % (ref 36.0–46.0)
Hemoglobin: 12.6 g/dL (ref 12.0–15.0)
Immature Granulocytes: 0 %
Lymphocytes Relative: 38 %
Lymphs Abs: 1.7 10*3/uL (ref 0.7–4.0)
MCH: 32.9 pg (ref 26.0–34.0)
MCHC: 32.3 g/dL (ref 30.0–36.0)
MCV: 101.8 fL — ABNORMAL HIGH (ref 80.0–100.0)
Monocytes Absolute: 0.5 10*3/uL (ref 0.1–1.0)
Monocytes Relative: 12 %
Neutro Abs: 1.9 10*3/uL (ref 1.7–7.7)
Neutrophils Relative %: 44 %
Platelets: 154 10*3/uL (ref 150–400)
RBC: 3.83 MIL/uL — ABNORMAL LOW (ref 3.87–5.11)
RDW: 14.4 % (ref 11.5–15.5)
WBC: 4.4 10*3/uL (ref 4.0–10.5)
nRBC: 0 % (ref 0.0–0.2)

## 2019-07-30 LAB — RAPID URINE DRUG SCREEN, HOSP PERFORMED
Amphetamines: NOT DETECTED
Barbiturates: NOT DETECTED
Benzodiazepines: POSITIVE — AB
Cocaine: NOT DETECTED
Opiates: NOT DETECTED
Tetrahydrocannabinol: POSITIVE — AB

## 2019-07-30 LAB — I-STAT BETA HCG BLOOD, ED (MC, WL, AP ONLY): I-stat hCG, quantitative: <5 m[IU]/mL (ref <5)

## 2019-07-30 LAB — SARS CORONAVIRUS 2 BY RT PCR (HOSPITAL ORDER, PERFORMED IN ~~LOC~~ HOSPITAL LAB): SARS Coronavirus 2: NEGATIVE

## 2019-07-30 LAB — ETHANOL: Alcohol, Ethyl (B): <10 mg/dL (ref <10)

## 2019-07-30 MED ORDER — GABAPENTIN 400 MG PO CAPS
400.0000 mg | ORAL_CAPSULE | Freq: Three times a day (TID) | ORAL | Status: DC
  Administered 2019-07-30 – 2019-07-31 (×2): 400 mg via ORAL
  Filled 2019-07-30 (×2): qty 1

## 2019-07-30 MED ORDER — CITALOPRAM HYDROBROMIDE 10 MG PO TABS
20.0000 mg | ORAL_TABLET | Freq: Every day | ORAL | Status: DC
  Administered 2019-07-30 – 2019-07-31 (×2): 20 mg via ORAL
  Filled 2019-07-30 (×2): qty 2

## 2019-07-30 MED ORDER — QUETIAPINE FUMARATE 50 MG PO TABS: 300.0000 mg | ORAL_TABLET | Freq: Every day | ORAL | Status: DC

## 2019-07-30 MED ORDER — STERILE WATER FOR INJECTION IJ SOLN
INTRAMUSCULAR | Status: AC
  Filled 2019-07-30: qty 10

## 2019-07-30 MED ORDER — GLECAPREVIR-PIBRENTASVIR 100-40 MG PO TABS: 3.0000 | ORAL_TABLET | Freq: Every day | ORAL | Status: DC

## 2019-07-30 MED ORDER — LORAZEPAM 1 MG PO TABS
1.0000 mg | ORAL_TABLET | Freq: Once | ORAL | Status: AC
  Administered 2019-07-30: 1 mg via ORAL
  Filled 2019-07-30: qty 1

## 2019-07-30 MED ORDER — ELVITEG-COBIC-EMTRICIT-TENOFAF 150-150-200-10 MG PO TABS
1.0000 | ORAL_TABLET | Freq: Every day | ORAL | Status: DC
  Filled 2019-07-30 (×2): qty 1

## 2019-07-30 MED ORDER — ALPRAZOLAM 0.5 MG PO TABS
1.0000 mg | ORAL_TABLET | Freq: Two times a day (BID) | ORAL | Status: DC
  Administered 2019-07-31: 1 mg via ORAL
  Filled 2019-07-30: qty 2

## 2019-07-30 MED ORDER — NICOTINE 21 MG/24HR TD PT24
21.0000 mg | MEDICATED_PATCH | Freq: Once | TRANSDERMAL | Status: DC
  Administered 2019-07-30: 21 mg via TRANSDERMAL
  Filled 2019-07-30: qty 1

## 2019-07-30 MED ORDER — ZIPRASIDONE MESYLATE 20 MG IM SOLR
10.0000 mg | Freq: Once | INTRAMUSCULAR | Status: AC
  Administered 2019-07-30: 10 mg via INTRAMUSCULAR
  Filled 2019-07-30: qty 20

## 2019-07-30 NOTE — ED Notes (Signed)
Patient denies pain and is resting comfortably.  

## 2019-07-30 NOTE — ED Notes (Addendum)
Pt called out and requested a sprite

## 2019-07-30 NOTE — ED Notes (Signed)
Informed pt the I have tried to reach out to [facility] to see when they may TTS her. Offered pt celexa and gabapentin but she refused.

## 2019-07-30 NOTE — ED Notes (Signed)
Dinner ordered 
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
## 2019-07-30 NOTE — ED Notes (Signed)
Pt dressed in purple scrubs. Pt belongings placed in bags and jewelry placed in cup. Rings were unable to come off the patients fingers at this time. Security has been called to wand the patient.
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
## 2019-07-30 NOTE — BH Assessment (Signed)
Spoke to [employer], RN who said Pt has received medication and is currently too somnolent to participate in assessment.   Evelena Peat, [facility], [facility] Triage Specialist [phone]
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
## 2019-07-30 NOTE — ED Notes (Signed)
EDP at bedside  

## 2019-07-30 NOTE — ED Notes (Signed)
Ford called from [facility] to do pts TTS. Pt to sedated to complete TTS. Will call [facility] when pt is able to be assessed.

## 2019-07-31 ENCOUNTER — Other Ambulatory Visit: Payer: Self-pay

## 2019-07-31 DIAGNOSIS — F250 Schizoaffective disorder, bipolar type: Secondary | ICD-10-CM

## 2019-07-31 NOTE — ED Notes (Signed)
Pt aware her son, Evette Doffing, advised he will come pick her up.

## 2019-07-31 NOTE — ED Notes (Addendum)
ALL belongings - 1 labeled belongings bag - returned to pt - Pt signed verifying all items present except missing black shoe - Advised pt will call if locate shoe - Son has arrived to transport pt from ED. Pt voiced understanding to take meds as directed - States "I just picked them up yesterday and I will take them".

## 2019-07-31 NOTE — ED Notes (Signed)
Pt ate breakfast - TTS performed - Ambulatory to nurses' desk asking when she will be able to leave d/t was advised by TTS plan is for her to be d/c'd. Advised pt of process - Voiced understanding then returned to room.

## 2019-07-31 NOTE — ED Provider Notes (Signed)
Emergency Medicine Observation Re-evaluation Note  ELIANA LUETH is a 59 y.o. female, seen on rounds today.  Pt initially presented to the ED for complaints of Psychiatric Evaluation Currently, the patient is resting comfortably, no distress.   Physical Exam  BP 124/85 (BP Location: Left Arm)   Pulse 71   Temp 98 F (36.7 C) (Oral)   Resp 18   Ht 5\' 5"  (1.651 m)   Wt 82.1 kg   SpO2 99%   BMI 30.12 kg/m  Physical Exam Awake, alert and appropriate.  No distresss ED Course / MDM  EKG:EKG Interpretation  Date/Time:  Friday July 30 2019 15:02:47 EDT Ventricular Rate:  67 PR Interval:    QRS Duration: 76 QT Interval:  439 QTC Calculation: 464 R Axis:   52 Text Interpretation: Sinus rhythm Low voltage, precordial leads Confirmed by Fredia Sorrow [phone]) on 07/30/2019 4:20:18 PM    I have reviewed the labs performed to date as well as medications administered while in observation.  Recent changes in the last 24 hours include re-eval by TTS. Patient has been back on her medications for the last two days and is currently feeling well. No SI/HI, hallucinations. Cleared for discharge by Psych.  Plan  Current plan is for Discharge. Patient is not under full IVC at this time.   Truddie Hidden, MD 07/31/19 [phone]

## 2019-07-31 NOTE — ED Notes (Signed)
BREAKFAST ORDERED--Mackenzie Gonzalez  
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
## 2019-08-03 LAB — CBC WITH DIFFERENTIAL/PLATELET
Absolute Monocytes: 346 cells/uL (ref 200–950)
Basophils Absolute: 19 cells/uL (ref 0–200)
Basophils Relative: 0.4 %
Eosinophils Absolute: 178 cells/uL (ref 15–500)
Eosinophils Relative: 3.7 %
HCT: 42.8 % (ref 35.0–45.0)
Hemoglobin: 14.2 g/dL (ref 11.7–15.5)
Lymphs Abs: 1190 cells/uL (ref 850–3900)
MCH: 31.9 pg (ref 27.0–33.0)
MCHC: 33.2 g/dL (ref 32.0–36.0)
MCV: 96.2 fL (ref 80.0–100.0)
MPV: 12 fL (ref 7.5–12.5)
Monocytes Relative: 7.2 %
Neutro Abs: 3067 cells/uL (ref 1500–7800)
Neutrophils Relative %: 63.9 %
Platelets: 181 10*3/uL (ref 140–400)
RBC: 4.45 10*6/uL (ref 3.80–5.10)
RDW: 13.8 % (ref 11.0–15.0)
Total Lymphocyte: 24.8 %
WBC: 4.8 10*3/uL (ref 3.8–10.8)

## 2019-08-03 LAB — COMPREHENSIVE METABOLIC PANEL
AG Ratio: 1.5 (calc) (ref 1.0–2.5)
ALT: 67 U/L — ABNORMAL HIGH (ref 6–29)
AST: 45 U/L — ABNORMAL HIGH (ref 10–35)
Albumin: 4.3 g/dL (ref 3.6–5.1)
Alkaline phosphatase (APISO): 75 U/L (ref 37–153)
BUN: 22 mg/dL (ref 7–25)
CO2: 30 mmol/L (ref 20–32)
Calcium: 9.8 mg/dL (ref 8.6–10.4)
Chloride: 103 mmol/L (ref 98–110)
Creat: 0.97 mg/dL (ref 0.50–1.05)
Globulin: 2.9 g/dL (calc) (ref 1.9–3.7)
Glucose, Bld: 116 mg/dL — ABNORMAL HIGH (ref 65–99)
Potassium: 4.6 mmol/L (ref 3.5–5.3)
Sodium: 141 mmol/L (ref 135–146)
Total Bilirubin: 0.8 mg/dL (ref 0.2–1.2)
Total Protein: 7.2 g/dL (ref 6.1–8.1)

## 2019-08-03 LAB — HIV-1 RNA QUANT-NO REFLEX-BLD
HIV 1 RNA Quant: 42 copies/mL — ABNORMAL HIGH
HIV-1 RNA Quant, Log: 1.62 Log copies/mL — ABNORMAL HIGH

## 2019-08-03 LAB — LIVER FIBROSIS, FIBROTEST-ACTITEST
ALT: 66 U/L — ABNORMAL HIGH (ref 6–29)
Alpha-2-Macroglobulin: 413 mg/dL — ABNORMAL HIGH (ref 106–279)
Apolipoprotein A1: 156 mg/dL (ref 101–198)
Bilirubin: 0.7 mg/dL (ref 0.2–1.2)
Fibrosis Score: 0.65
GGT: 39 U/L (ref 3–70)
Haptoglobin: 82 mg/dL (ref 43–212)
Necroinflammat ACT Score: 0.52
Reference ID: 3458901

## 2019-08-03 LAB — HEPATITIS C RNA QUANTITATIVE
HCV Quantitative Log: 6.5 Log IU/mL — ABNORMAL HIGH
HCV RNA, PCR, QN: 3130000 IU/mL — ABNORMAL HIGH

## 2019-08-11 ENCOUNTER — Encounter (HOSPITAL_COMMUNITY): Payer: Self-pay | Admitting: Psychiatry

## 2019-08-11 ENCOUNTER — Other Ambulatory Visit: Payer: Self-pay

## 2019-08-11 ENCOUNTER — Ambulatory Visit (INDEPENDENT_AMBULATORY_CARE_PROVIDER_SITE_OTHER): Payer: Medicaid Other | Admitting: Psychiatry

## 2019-08-11 DIAGNOSIS — F419 Anxiety disorder, unspecified: Secondary | ICD-10-CM

## 2019-08-11 DIAGNOSIS — F250 Schizoaffective disorder, bipolar type: Secondary | ICD-10-CM | POA: Diagnosis not present

## 2019-08-11 MED ORDER — GABAPENTIN 400 MG PO CAPS: 400.0000 mg | ORAL_CAPSULE | Freq: Three times a day (TID) | ORAL | 1 refills | Status: DC

## 2019-08-11 MED ORDER — QUETIAPINE FUMARATE 300 MG PO TABS: 300.0000 mg | ORAL_TABLET | Freq: Every day | ORAL | 1 refills | Status: DC

## 2019-08-11 MED ORDER — CITALOPRAM HYDROBROMIDE 20 MG PO TABS: 20.0000 mg | ORAL_TABLET | Freq: Every day | ORAL | 1 refills | Status: DC

## 2019-08-11 MED ORDER — ALPRAZOLAM 1 MG PO TABS: 1.0000 mg | ORAL_TABLET | Freq: Three times a day (TID) | ORAL | 1 refills | Status: DC | PRN
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
## 2019-08-31 ENCOUNTER — Ambulatory Visit: Payer: Medicaid Other | Admitting: Infectious Diseases

## 2019-08-31 ENCOUNTER — Other Ambulatory Visit (HOSPITAL_COMMUNITY): Payer: Self-pay | Admitting: *Deleted

## 2019-09-02 ENCOUNTER — Ambulatory Visit (INDEPENDENT_AMBULATORY_CARE_PROVIDER_SITE_OTHER): Payer: Medicaid Other | Admitting: Infectious Diseases

## 2019-09-02 ENCOUNTER — Other Ambulatory Visit: Payer: Self-pay | Admitting: Infectious Diseases

## 2019-09-02 ENCOUNTER — Other Ambulatory Visit: Payer: Self-pay

## 2019-09-02 ENCOUNTER — Encounter: Payer: Self-pay | Admitting: Infectious Diseases

## 2019-09-02 DIAGNOSIS — B182 Chronic viral hepatitis C: Secondary | ICD-10-CM | POA: Diagnosis present

## 2019-09-02 DIAGNOSIS — B20 Human immunodeficiency virus [HIV] disease: Secondary | ICD-10-CM | POA: Diagnosis not present

## 2019-09-02 DIAGNOSIS — J410 Simple chronic bronchitis: Secondary | ICD-10-CM | POA: Insufficient documentation

## 2019-09-02 DIAGNOSIS — F250 Schizoaffective disorder, bipolar type: Secondary | ICD-10-CM | POA: Diagnosis not present

## 2019-09-02 MED ORDER — MAVYRET 100-40 MG PO TABS: 3.0000 | ORAL_TABLET | Freq: Every day | ORAL | 1 refills | Status: DC

## 2019-09-02 MED ORDER — ALBUTEROL SULFATE HFA 108 (90 BASE) MCG/ACT IN AERS: 2.0000 | INHALATION_SPRAY | Freq: Four times a day (QID) | RESPIRATORY_TRACT | 3 refills | Status: DC | PRN

## 2019-09-02 MED FILL — PROAIR HFA 90 MCG INHALER: 108 (90 BAS | 17 days supply | Qty: 9 | Fill #0

## 2019-09-02 NOTE — Assessment & Plan Note (Signed)
She appears to be doing well.  Last HIV RNA was < 50.  States adherence Has condoms encouraged her to get COVID vax rtc in 3-4

## 2019-09-02 NOTE — Progress Notes (Signed)
° °  Subjective:    Patient ID: Mackenzie Gonzalez, female    DOB: 11-Oct-1960, 59 y.o.   MRN: [id number]  HPI 59 yo F with hx of HIV+ for 6 years. Was previously cared for in [place]. Had random screen, asx. No hospitalizations.  Hep C+ for 20+ years.  She is currently genvoya.  She was ordered [facility] but has still not received.  Hx of bipolar schizoaffective d/o, anxiety. Still off seroquel.   Initially here from [place], Arrested March 10. She had first visit to [facility] 04-20-2019. Released April 13. She was adm to mental health center (6-28 to 07-27-19)  In the clinic today she is "huffing".  Saw dental yesterday, has assitance from [place]. She is now qualified for Section 8 voucher. Shelter+ Today c/o being lonely, "mean people".   Has appt for dexa and mammo 09-10-19 4pm   HIV 1 RNA Quant (copies/mL)  Date Value  07/27/2019 42 (H)  04/20/2019 4,030 (H)   CD4 T Cell Abs (/uL)  Date Value  07/27/2019 564  04/20/2019 715    Review of Systems  Constitutional: Negative for appetite change, chills, fever and unexpected weight change.  Respiratory: Positive for cough and shortness of breath (i get bronchitis alot. out of inhaler- albuterol).   Gastrointestinal: Positive for constipation. Negative for diarrhea.  Genitourinary: Negative for difficulty urinating.  Musculoskeletal: Positive for arthralgias (stiffness).  Please see HPI. All other systems reviewed and negative.     Objective:   Physical Exam Vitals reviewed.  Constitutional:      Appearance: She is normal weight.  HENT:     Mouth/Throat:     Mouth: Mucous membranes are moist.     Pharynx: No oropharyngeal exudate.  Eyes:     Extraocular Movements: Extraocular movements intact.     Pupils: Pupils are equal, round, and reactive to light.  Cardiovascular:     Rate and Rhythm: Normal rate and regular rhythm.  Pulmonary:     Effort: Pulmonary effort is normal.     Breath sounds: Normal breath sounds.    Abdominal:     General: Bowel sounds are normal. There is no distension.     Palpations: Abdomen is soft.     Tenderness: There is no abdominal tenderness.  Musculoskeletal:        General: Normal range of motion.     Cervical back: Normal range of motion and neck supple.     Right lower leg: No edema.     Left lower leg: No edema.  Skin:    Findings: Rash present.     Comments: Vitiligo diffusely.   Neurological:     General: No focal deficit present.     Mental Status: She is alert.           Assessment & Plan:

## 2019-09-02 NOTE — Assessment & Plan Note (Signed)
>>  ASSESSMENT AND PLAN FOR HIV DISEASE WRITTEN ON 09/02/2019  4:03 PM BY HATCHER, JEFFREY C, MD  She appears to be doing well.  Last HIV RNA was < 50.  States adherence Has condoms encouraged her to get COVID vax rtc in 3-4

## 2019-09-02 NOTE — Assessment & Plan Note (Signed)
Appreciate Mitch helping with housing.  She likes her counselor Awaiting seroquel refill.

## 2019-09-02 NOTE — Assessment & Plan Note (Signed)
Will refill her albuterol MDI.  She states she does not like the "powder" one. Steroid inh.  Possible pulmonary eval for PFTs

## 2019-09-02 NOTE — Assessment & Plan Note (Signed)
Will talk to pharm about getting this started.

## 2019-09-03 ENCOUNTER — Telehealth (HOSPITAL_COMMUNITY): Payer: Self-pay | Admitting: *Deleted

## 2019-09-03 NOTE — Telephone Encounter (Signed)
Prior authorization required for Seroquel  #[id number]. [facility] notified.

## 2019-09-09 ENCOUNTER — Telehealth: Payer: Self-pay

## 2019-09-09 NOTE — Telephone Encounter (Signed)
Received call from pharmacy due to them being unable to reach patient for medication delivery. Provided pharmacy with patient's home phone number. Mackenzie Gonzalez

## 2019-09-10 ENCOUNTER — Other Ambulatory Visit: Payer: Self-pay

## 2019-09-10 ENCOUNTER — Ambulatory Visit
Admission: RE | Admit: 2019-09-10 | Discharge: 2019-09-10 | Disposition: A | Discharge status: Home / Self Care | Payer: Medicaid Other | Source: Ambulatory Visit | Attending: Infectious Diseases | Admitting: Infectious Diseases

## 2019-09-10 ENCOUNTER — Ambulatory Visit
Admission: RE | Admit: 2019-09-10 | Discharge: 2019-09-10 | Disposition: A | Discharge status: Home / Self Care | Payer: Medicaid Other | Source: Ambulatory Visit | Attending: Primary Care | Admitting: Primary Care

## 2019-09-10 DIAGNOSIS — Z1231 Encounter for screening mammogram for malignant neoplasm of breast: Secondary | ICD-10-CM

## 2019-09-10 DIAGNOSIS — Z780 Asymptomatic menopausal state: Secondary | ICD-10-CM

## 2019-09-12 ENCOUNTER — Other Ambulatory Visit (INDEPENDENT_AMBULATORY_CARE_PROVIDER_SITE_OTHER): Payer: Self-pay | Admitting: Primary Care

## 2019-09-12 DIAGNOSIS — J410 Simple chronic bronchitis: Secondary | ICD-10-CM

## 2019-09-12 MED ORDER — CALCIUM CARBONATE-VITAMIN D 500-200 MG-UNIT PO TABS: 1.0000 | ORAL_TABLET | Freq: Every day | ORAL | 3 refills | Status: DC

## 2019-09-13 ENCOUNTER — Telehealth (INDEPENDENT_AMBULATORY_CARE_PROVIDER_SITE_OTHER): Payer: Self-pay

## 2019-09-13 MED FILL — PROAIR HFA 90 MCG INHALER: 108 (90 BAS | 25 days supply | Qty: 9 | Fill #0

## 2019-09-13 NOTE — Telephone Encounter (Signed)
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

## 2019-09-13 NOTE — Telephone Encounter (Signed)
Patient verified date of birth. She is aware of low bone density and Rx being sent to pharmacy. She verbalized understanding. Nat Christen, CMA

## 2019-10-01 ENCOUNTER — Other Ambulatory Visit: Payer: Self-pay | Admitting: Infectious Diseases

## 2019-10-01 DIAGNOSIS — R928 Other abnormal and inconclusive findings on diagnostic imaging of breast: Secondary | ICD-10-CM

## 2019-10-08 ENCOUNTER — Other Ambulatory Visit: Payer: Self-pay

## 2019-10-08 ENCOUNTER — Ambulatory Visit
Admission: EM | Admit: 2019-10-08 | Discharge: 2019-10-08 | Disposition: A | Discharge status: Home / Self Care | Payer: Medicaid Other | Attending: Emergency Medicine | Admitting: Emergency Medicine

## 2019-10-08 ENCOUNTER — Ambulatory Visit (INDEPENDENT_AMBULATORY_CARE_PROVIDER_SITE_OTHER): Payer: Medicaid Other

## 2019-10-08 DIAGNOSIS — M25571 Pain in right ankle and joints of right foot: Secondary | ICD-10-CM

## 2019-10-08 DIAGNOSIS — W010XXA Fall on same level from slipping, tripping and stumbling without subsequent striking against object, initial encounter: Secondary | ICD-10-CM

## 2019-10-08 DIAGNOSIS — S82891A Other fracture of right lower leg, initial encounter for closed fracture: Secondary | ICD-10-CM

## 2019-10-08 DIAGNOSIS — W19XXXA Unspecified fall, initial encounter: Secondary | ICD-10-CM

## 2019-10-08 MED ORDER — ACETAMINOPHEN 500 MG PO TABS: 1000.0000 mg | ORAL_TABLET | Freq: Three times a day (TID) | ORAL | 0 refills | Status: DC | PRN

## 2019-10-08 NOTE — ED Triage Notes (Signed)
Pt states tripped over a step yesterday and fell on the ground. C/o bilateral ankle, bilateral knee, and lower back pain.

## 2019-10-08 NOTE — ED Provider Notes (Signed)
[name]    CSN: [id number] Arrival date & time: 10/08/19  1012      History   Chief Complaint Chief Complaint  Patient presents with  . Fall    HPI Mackenzie Gonzalez is a 59 y.o. female  Presenting for right ankle pain and swelling. Patient states that she fell down a few steps the other day. Hydroma, LOC. Denies anticoagulant use. Took gabapentin for pain without relief. Patient does have bilateral ankle and knee pain, low back pain. No abdominal pain, neck pain, numbness or weakness. Is able to ambulate, though right ankle is hurting the most and is most swollen.  Past Medical History:  Diagnosis Date  . Anxiety   . Bipolar 1 disorder ([place])   . Chronic bronchitis ([place])   . Hepatitis C carrier ([place])   . HIV (human immunodeficiency virus infection) ([place]) 2015  . Schizoaffective disorder [facility])     Patient Active Problem List   Diagnosis Date Noted  . Simple chronic bronchitis ([place]) 09/02/2019  . Anxiety disorder 07/12/2019  . Chronic pain 05/25/2019  . Hepatitis B immune 04/20/2019  . Hepatitis A immune 04/20/2019  . Chronic viral hepatitis C ([place]) 04/20/2019  . HIV disease ([place]) 04/20/2019  . Tobacco abuse 04/20/2019  . Schizoaffective disorder, bipolar type ([place]) 04/20/2019    Past Surgical History:  Procedure Laterality Date  . SP ARTHRO WRIST*R*      OB History   No obstetric history on file.      Home Medications    Prior to Admission medications   Medication Sig Start Date End Date Taking? Authorizing Provider  acetaminophen (TYLENOL) 500 MG tablet Take 2 tablets (1,000 mg total) by mouth every 8 (eight) hours as needed. 10/08/19   Hall-Potvin, [place], PA-C  albuterol (VENTOLIN HFA) 108 (90 Base) MCG/ACT inhaler Inhale 2 puffs into the lungs every 6 (six) hours as needed for wheezing or shortness of breath. 09/02/19   Campbell Riches, MD  ALPRAZolam Duanne Moron) 1 MG tablet Take 1 tablet (1 mg total) by mouth 3 (three) times daily as needed  for anxiety. 08/11/19   [place] Crane, MD  calcium-vitamin D (OSCAL WITH D) 500-200 MG-UNIT tablet Take 1 tablet by mouth daily with breakfast. 09/12/19   Kerin Perna, NP  citalopram (CELEXA) 20 MG tablet Take 1 tablet (20 mg total) by mouth daily. 08/11/19   [place] Crane, MD  elvitegravir-cobicistat-emtricitabine-tenofovir (GENVOYA) 150-150-200-10 MG TABS tablet Take 1 tablet by mouth daily with breakfast. 05/28/19   [place], FNP  gabapentin (NEURONTIN) 400 MG capsule Take 1 capsule (400 mg total) by mouth 3 (three) times daily. 08/11/19   [place] Crane, MD  Glecaprevir-Pibrentasvir (MAVYRET) 100-40 MG TABS Take 3 tablets by mouth daily with breakfast. 09/02/19   Campbell Riches, MD  ibuprofen (ADVIL) 800 MG tablet Take 800 mg by mouth at bedtime. 06/23/19   [provider]  QUEtiapine (SEROQUEL) 300 MG tablet Take 1 tablet (300 mg total) by mouth at bedtime. 08/11/19   Nevada Crane, MD    Family History Family History  Problem Relation Age of Onset  . Psychiatric Illness Mother   . Hepatitis Father   . Alcohol abuse Father     Social History Social History   Tobacco Use  . Smoking status: Current Every Day Smoker    Packs/day: 1.00    Types: Cigarettes  . Smokeless tobacco: Never Used  Substance Use Topics  . Alcohol use: Yes    Alcohol/week: 1.0  standard drink    Types: 1 Cans of beer per week    Comment: occ  . Drug use: Not Currently    Types: "Crack" cocaine     Allergies   Haloperidol and Haloperidol lactate   Review of Systems As per HPI   Physical Exam Triage Vital Signs ED Triage Vitals [10/08/19 1044]  Enc Vitals Group     BP 134/87     Pulse Rate 100     Resp 18     Temp 98.4 F (36.9 C)     Temp Source Oral     SpO2 95 %     Weight      Height      Head Circumference      Peak Flow      Pain Score 0     Pain Loc      Pain Edu?      Excl. in [place]?    No data found.  Updated Vital Signs BP 134/87 (BP Location: Left  Arm)   Pulse 100   Temp 98.4 F (36.9 C) (Oral)   Resp 18   SpO2 95%   Visual Acuity Right Eye Distance:   Left Eye Distance:   Bilateral Distance:    Right Eye Near:   Left Eye Near:    Bilateral Near:     Physical Exam Constitutional:      General: She is not in acute distress. HENT:     Head: Normocephalic and atraumatic.  Eyes:     General: No scleral icterus.    Pupils: Pupils are equal, round, and reactive to light.  Cardiovascular:     Rate and Rhythm: Normal rate.  Pulmonary:     Effort: Pulmonary effort is normal.  Skin:    Coloration: Skin is not jaundiced or pale.  Neurological:     Mental Status: She is alert and oriented to person, place, and time.      UC Treatments / Results  Labs (all labs ordered are listed, but only abnormal results are displayed) Labs Reviewed - No data to display  EKG   Radiology DG Ankle Complete Right  Result Date: 10/08/2019 CLINICAL DATA:  Right ankle pain after tripping over step. Likely internal rotation injury. Pain on lateral malleolus. EXAM: RIGHT ANKLE - COMPLETE 3+ VIEW COMPARISON:  None. FINDINGS: Lucency through the distal aspect of the lateral malleolus with adjacent soft tissue swelling, compatible with acute avulsion fracture. Ankle mortise is congruent. Plantar calcaneal enthesophyte. IMPRESSION: 1. Acute avulsion fracture from the lateral malleolus with associated soft tissue swelling. 2. Ankle mortise is congruent. Electronically Signed   By: Margaretha Sheffield MD   On: 10/08/2019 12:00    Procedures Procedures (including critical care time)  Medications Ordered in UC Medications - No data to display  Initial Impression / Assessment and Plan / UC Course  I have reviewed the triage vital signs and the nursing notes.  Pertinent labs & imaging results that were available during my care of the patient were reviewed by me and considered in my medical decision making (see chart for details).     Right  ankle x-ray performed given swelling, lateral malleolus tenderness with fall - Acute avulsion fracture from lateral malleolus with associated soft tissue swelling noted.  Ankle mortise is congruent.  Patient placed in a boot, will follow up with ortho in 1 week.  return precautions discussed, pt verbalized understanding and is agreeable to plan. Final Clinical Impressions(s) / UC Diagnoses  Final diagnoses:  Closed fracture of right ankle, initial encounter  Fall, initial encounter     Discharge Instructions     RICE: rest, ice, compression, elevation as needed for pain.   Cold therapy (ice packs) can be used to help swelling both after injury and after prolonged use of areas of chronic pain/aches.  Pain medication:  Tylenol as directed.  Important to follow up with specialist(s) below for further evaluation/management if your symptoms persist or worsen.    ED Prescriptions    Medication Sig Dispense Auth. Provider   acetaminophen (TYLENOL) 500 MG tablet Take 2 tablets (1,000 mg total) by mouth every 8 (eight) hours as needed. 30 tablet Hall-Potvin, [place], PA-C     I have reviewed the PDMP during this encounter.   Hall-Potvin, [place], [place] 10/08/19 1253

## 2019-10-08 NOTE — Discharge Instructions (Addendum)
RICE: rest, ice, compression, elevation as needed for pain.   Cold therapy (ice packs) can be used to help swelling both after injury and after prolonged use of areas of chronic pain/aches.  Pain medication:  Tylenol as directed.  Important to follow up with specialist(s) below for further evaluation/management if your symptoms persist or worsen.

## 2019-10-11 ENCOUNTER — Ambulatory Visit
Admission: RE | Admit: 2019-10-11 | Discharge: 2019-10-11 | Disposition: A | Discharge status: Home / Self Care | Payer: Medicaid Other | Source: Ambulatory Visit | Attending: Infectious Diseases | Admitting: Infectious Diseases

## 2019-10-11 ENCOUNTER — Other Ambulatory Visit: Payer: Self-pay | Admitting: Infectious Diseases

## 2019-10-11 ENCOUNTER — Other Ambulatory Visit: Payer: Self-pay

## 2019-10-11 DIAGNOSIS — R928 Other abnormal and inconclusive findings on diagnostic imaging of breast: Secondary | ICD-10-CM

## 2019-10-12 ENCOUNTER — Ambulatory Visit (INDEPENDENT_AMBULATORY_CARE_PROVIDER_SITE_OTHER): Payer: Medicaid Other | Admitting: Psychiatry

## 2019-10-12 ENCOUNTER — Encounter (HOSPITAL_COMMUNITY): Payer: Self-pay | Admitting: Psychiatry

## 2019-10-12 DIAGNOSIS — F419 Anxiety disorder, unspecified: Secondary | ICD-10-CM

## 2019-10-12 DIAGNOSIS — F250 Schizoaffective disorder, bipolar type: Secondary | ICD-10-CM

## 2019-10-12 MED ORDER — ALPRAZOLAM 1 MG PO TABS: 1.0000 mg | ORAL_TABLET | Freq: Three times a day (TID) | ORAL | 1 refills | Status: DC | PRN

## 2019-10-12 MED ORDER — CITALOPRAM HYDROBROMIDE 20 MG PO TABS: 20.0000 mg | ORAL_TABLET | Freq: Every day | ORAL | 1 refills | Status: DC

## 2019-10-12 MED ORDER — QUETIAPINE FUMARATE 300 MG PO TABS: 300.0000 mg | ORAL_TABLET | Freq: Every day | ORAL | 1 refills | Status: DC

## 2019-10-12 MED ORDER — GABAPENTIN 400 MG PO CAPS: 400.0000 mg | ORAL_CAPSULE | Freq: Three times a day (TID) | ORAL | 1 refills | Status: DC

## 2019-10-12 NOTE — Progress Notes (Signed)
[place] MD/PA/NP OP Progress Note  10/12/2019 11:42 AM Mackenzie Gonzalez  MRN:  [id number]  Chief Complaint:  " I am doing okay."  HPI: Patient reported she has been doing well.  She stated that she has been taking her medications as prescribed regularly.  She informed that a cousin that she was living with passed away about 3 weeks ago.  She stated she is a little worried that her cousin son may kick her out and that is why she is keeping a low key.  She informed that Mr. Scarlette Calico is continuing to help her by providing her transportation.  She stated that he is also working on getting her suitable housing. She stated that she is trying to stay away from going back to using any illicit substances.  She tries to keep to herself and mind her own business. She rambled about some other things including her other family members.  Patient was informed about the [facility] starting in the clinic.  She stated that she was interested in joining those groups.  She was introduced to the clinician responsible for the groups.   Visit Diagnosis:    ICD-10-CM   1. Schizoaffective disorder, bipolar type ([place])  F25.0   2. Anxiety disorder, unspecified type  F41.9     Past Psychiatric History: Schizoaffective disorder, polysubstance abuse  Past Medical History:  Past Medical History:  Diagnosis Date  . Anxiety   . Bipolar 1 disorder ([place])   . Chronic bronchitis ([place])   . Hepatitis C carrier ([place])   . HIV (human immunodeficiency virus infection) ([place]) 2015  . Schizoaffective disorder [facility])     Past Surgical History:  Procedure Laterality Date  . BREAST EXCISIONAL BIOPSY Left 1987   benign  . SP ARTHRO WRIST*R*      Family Psychiatric History: Mother- bipolar d/o, father- alcohol abuse   Family History:  Family History  Problem Relation Age of Onset  . Psychiatric Illness Mother   . Hepatitis Father   . Alcohol abuse Father   . Breast cancer Paternal Aunt     Social History:  Social History    Socioeconomic History  . Marital status: Single    Spouse name: Not on file  . Number of children: Not on file  . Years of education: Not on file  . Highest education level: Not on file  Occupational History  . Not on file  Tobacco Use  . Smoking status: Current Every Day Smoker    Packs/day: 1.00    Types: Cigarettes  . Smokeless tobacco: Never Used  Substance and Sexual Activity  . Alcohol use: Yes    Alcohol/week: 1.0 standard drink    Types: 1 Cans of beer per week    Comment: occ  . Drug use: Not Currently    Types: "Crack" cocaine  . Sexual activity: Not Currently    Partners: Female, Female  Other Topics Concern  . Not on file  Social History Narrative  . Not on file   Social Determinants of Health   Financial Resource Strain:   . Difficulty of Paying Living Expenses: Not on file  Food Insecurity:   . Worried About Charity fundraiser in the Last Year: Not on file  . Ran Out of Food in the Last Year: Not on file  Transportation Needs:   . Lack of Transportation (Medical): Not on file  . Lack of Transportation (Non-Medical): Not on file  Physical Activity:   . Days of  Exercise per Week: Not on file  . Minutes of Exercise per Session: Not on file  Stress:   . Feeling of Stress : Not on file  Social Connections:   . Frequency of Communication with Friends and Family: Not on file  . Frequency of Social Gatherings with Friends and Family: Not on file  . Attends Religious Services: Not on file  . Active Member of Clubs or Organizations: Not on file  . Attends Archivist Meetings: Not on file  . Marital Status: Not on file    Allergies:  Allergies  Allergen Reactions  . Haloperidol   . Haloperidol Lactate     Metabolic Disorder Labs: Lab Results  Component Value Date   HGBA1C 5.3 07/26/2019   MPG 105.41 07/26/2019   No results found for: PROLACTIN Lab Results  Component Value Date   CHOL 193 07/26/2019   TRIG 87 07/26/2019   HDL 55  07/26/2019   CHOLHDL 3.5 07/26/2019   VLDL 17 07/26/2019   LDLCALC 121 (H) 07/26/2019   LDLCALC 100 (H) 04/20/2019   Lab Results  Component Value Date   TSH 1.726 07/26/2019    Therapeutic Level Labs: No results found for: LITHIUM No results found for: VALPROATE No components found for:  CBMZ  Current Medications: Current Outpatient Medications  Medication Sig Dispense Refill  . acetaminophen (TYLENOL) 500 MG tablet Take 2 tablets (1,000 mg total) by mouth every 8 (eight) hours as needed. 30 tablet 0  . albuterol (VENTOLIN HFA) 108 (90 Base) MCG/ACT inhaler Inhale 2 puffs into the lungs every 6 (six) hours as needed for wheezing or shortness of breath. 18 g 3  . ALPRAZolam (XANAX) 1 MG tablet Take 1 tablet (1 mg total) by mouth 3 (three) times daily as needed for anxiety. 90 tablet 1  . calcium-vitamin D (OSCAL WITH D) 500-200 MG-UNIT tablet Take 1 tablet by mouth daily with breakfast. 90 tablet 3  . citalopram (CELEXA) 20 MG tablet Take 1 tablet (20 mg total) by mouth daily. 30 tablet 1  . elvitegravir-cobicistat-emtricitabine-tenofovir (GENVOYA) 150-150-200-10 MG TABS tablet Take 1 tablet by mouth daily with breakfast. 90 tablet 3  . gabapentin (NEURONTIN) 400 MG capsule Take 1 capsule (400 mg total) by mouth 3 (three) times daily. 90 capsule 1  . Glecaprevir-Pibrentasvir (MAVYRET) 100-40 MG TABS Take 3 tablets by mouth daily with breakfast. 84 tablet 1  . ibuprofen (ADVIL) 800 MG tablet Take 800 mg by mouth at bedtime.    Marland Kitchen QUEtiapine (SEROQUEL) 300 MG tablet Take 1 tablet (300 mg total) by mouth at bedtime. 30 tablet 1   No current facility-administered medications for this visit.     Musculoskeletal: Strength & Muscle Tone: within normal limits Gait & Station: normal Patient leans: N/A  Psychiatric Specialty Exam: Review of Systems    There were no vitals taken for this visit.There is no height or weight on file to calculate BMI.  General Appearance: Fairly Groomed   Eye Contact:  Good  Speech:  Clear and Coherent and Normal Rate, rambling about past events  Volume:  Normal  Mood:  Euthymic  Affect:  Congruent and somewhat animated  Thought Process:  Goal Directed and Descriptions of Associations: Circumstantial  Orientation:  Full (Time, Place, and Person)  Thought Content: Paranoid Ideation and Rumination   Suicidal Thoughts:  No  Homicidal Thoughts:  No  Memory:  Immediate;   Good Recent;   Good  Judgement:  Fair  Insight:  Fair  Psychomotor Activity:  Restlessness  Concentration:  Concentration: Good and Attention Span: Good  Recall:  Good  Fund of Knowledge: Good  Language: Good  Akathisia:  Negative  Handed:  Right  AIMS (if indicated): not done  Assets:  Communication Skills Desire for Improvement Financial Resources/Insurance  ADL's:  Intact  Cognition: WNL  Sleep:  Fair   Screenings: PHQ2-9     ED from 07/30/2019 in [facility]  PHQ-2 Total Score 2  PHQ-9 Total Score 11       Assessment and Plan: Patient appears to be fairly stable for now.  She is interested in attending [facility] IOP, she was connected with the clinician running the groups.  1. Schizoaffective disorder, bipolar type ([place])  Continue - citalopram (CELEXA) 20 MG tablet; Take 1 tablet (20 mg total) by mouth daily.  Dispense: 30 tablet; Refill: 1 Continue - gabapentin (NEURONTIN) 400 MG capsule; Take 1 capsule (400 mg total) by mouth 3 (three) times daily.  Dispense: 90 capsule; Refill: 1 Continue - QUEtiapine (SEROQUEL) 300 MG tablet; Take 1 tablet (300 mg total) by mouth at bedtime.  Dispense: 30 tablet; Refill: 1  2. Anxiety disorder, unspecified type  Increase - ALPRAZolam (XANAX) 1 MG tablet; Take 1 tablet (1 mg total) by mouth 3 (three) times daily as needed for anxiety.  Dispense: 90 tablet; Refill: 1   Follow-up in 2 months. Referred to [facility] IOP.  [place] Crane, MD 10/12/2019, 11:42 AM

## 2019-10-18 ENCOUNTER — Ambulatory Visit (HOSPITAL_COMMUNITY): Payer: Medicaid Other | Admitting: Behavioral Health

## 2019-10-21 ENCOUNTER — Telehealth: Payer: Self-pay

## 2019-10-21 NOTE — Telephone Encounter (Signed)
[facility] Patient Advocate Encounter   Received notification from Medicaid that prior authorization for [place] is required.   PA submitted on 10/21/19    Status is pending    [facility] will continue to follow.   Ileene Patrick, [facility] Patient [facility] Phone: [phone] Fax:  [phone]

## 2019-10-27 ENCOUNTER — Other Ambulatory Visit: Payer: Self-pay

## 2019-10-27 ENCOUNTER — Other Ambulatory Visit: Payer: Self-pay | Admitting: Family

## 2019-10-27 DIAGNOSIS — J410 Simple chronic bronchitis: Secondary | ICD-10-CM

## 2019-10-27 DIAGNOSIS — B182 Chronic viral hepatitis C: Secondary | ICD-10-CM

## 2019-10-27 DIAGNOSIS — B20 Human immunodeficiency virus [HIV] disease: Secondary | ICD-10-CM

## 2019-10-27 MED ORDER — ELVITEG-COBIC-EMTRICIT-TENOFAF 150-150-200-10 MG PO TABS: 1.0000 | ORAL_TABLET | Freq: Every day | ORAL | 3 refills | Status: DC

## 2019-10-27 NOTE — Telephone Encounter (Signed)
[place] [facility] to [place] per patient request.   Beryle Flock, RN

## 2019-11-04 ENCOUNTER — Telehealth (HOSPITAL_COMMUNITY): Payer: Self-pay | Admitting: *Deleted

## 2019-11-04 NOTE — Telephone Encounter (Signed)
Followed up with prior auth request. After much time spent it was determined we have the wrong insurance plan information, she is no longer with [employer] but with [employer]. Called her to verify the correct info. She would like to change her Nov appt to a virtual as she is embroiled in finding section 8 housing and there are classes and a lot involved. Prior auth obtained for her Quetiapine #[id number] valid from today thru 10/29/2020. Pharmacy notified.

## 2019-11-19 MED FILL — MAVYRET 100-40 MG TABS: 100-40 | 28 days supply | Qty: 84 | Fill #1

## 2019-11-30 ENCOUNTER — Other Ambulatory Visit: Payer: Self-pay

## 2019-11-30 ENCOUNTER — Other Ambulatory Visit: Payer: Medicaid Other

## 2019-11-30 DIAGNOSIS — B182 Chronic viral hepatitis C: Secondary | ICD-10-CM

## 2019-11-30 DIAGNOSIS — B20 Human immunodeficiency virus [HIV] disease: Secondary | ICD-10-CM

## 2019-12-01 LAB — T-HELPER CELL (CD4) - (RCID CLINIC ONLY)
CD4 % Helper T Cell: 38 % (ref 33–65)
CD4 T Cell Abs: 733 /uL (ref 400–1790)

## 2019-12-03 LAB — HEPATITIS C RNA QUANTITATIVE
HCV RNA, PCR, QN (Log): <1.18 log IU/mL — ABNORMAL HIGH
HCV RNA, PCR, QN: <15 IU/mL — ABNORMAL HIGH

## 2019-12-03 LAB — HIV-1 RNA QUANT-NO REFLEX-BLD
HIV 1 RNA Quant: 28 Copies/mL — ABNORMAL HIGH
HIV-1 RNA Quant, Log: 1.44 Log cps/mL — ABNORMAL HIGH

## 2019-12-08 ENCOUNTER — Encounter: Payer: Medicaid Other | Admitting: Student

## 2019-12-08 NOTE — Progress Notes (Signed)
   CC: Hot flashes, polyarticular joint pain  HPI:  Mackenzie Gonzalez is a 58 y.o. female with history as below presenting to establish care with the above complaints. Please refer to problem based charting for further details of assessment and plan of current problem and chronic medical conditions.  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 Anxiety   . Bipolar 1 disorder ([place])   . Chronic bronchitis ([place])   . Hepatitis C carrier ([place])   . HIV (human immunodeficiency virus infection) ([place]) 2015  . Schizoaffective disorder ([place])    Review of Systems:   Review of Systems  Constitutional: Negative for chills, fever and weight loss.       Positive hot flashes  Respiratory: Positive for shortness of breath and wheezing. Negative for sputum production.   Cardiovascular: Negative for chest pain and palpitations.  Gastrointestinal: Negative for abdominal pain, nausea and vomiting.  Musculoskeletal: Positive for joint pain.  Skin: Negative for itching and rash.  All other systems reviewed and are negative.    Physical Exam: Vitals:   12/15/19 1340  BP: 105/61  Pulse: 82  Temp: 98.2 F (36.8 C)  TempSrc: Oral  SpO2: 97%  Weight: 191 lb (86.6 kg)   Constitutional: no acute distress Head: atraumatic ENT: external ears normal Cardiovascular: regular rate and rhythm, normal heart sounds Pulmonary: effort normal Abdominal: flat Skin: warm and dry, scattered patches of hypopigmented skin consistent with vitiligo Neurological: alert, no focal deficit Psychiatric: normal mood and  affect  Assessment & Plan:   See Encounters Tab for problem based charting.  Patient seen with Dr. Philipp Ovens

## 2019-12-13 ENCOUNTER — Other Ambulatory Visit (HOSPITAL_COMMUNITY): Payer: Self-pay | Admitting: Psychiatry

## 2019-12-13 DIAGNOSIS — F419 Anxiety disorder, unspecified: Secondary | ICD-10-CM

## 2019-12-14 ENCOUNTER — Ambulatory Visit (INDEPENDENT_AMBULATORY_CARE_PROVIDER_SITE_OTHER): Payer: Medicaid Other | Admitting: Psychiatry

## 2019-12-14 ENCOUNTER — Encounter (HOSPITAL_COMMUNITY): Payer: Self-pay | Admitting: Psychiatry

## 2019-12-14 ENCOUNTER — Other Ambulatory Visit: Payer: Self-pay

## 2019-12-14 VITALS — BP 122/88 | HR 86 | Ht 65.0 in | Wt 186.0 lb

## 2019-12-14 DIAGNOSIS — F419 Anxiety disorder, unspecified: Secondary | ICD-10-CM | POA: Diagnosis not present

## 2019-12-14 DIAGNOSIS — F250 Schizoaffective disorder, bipolar type: Secondary | ICD-10-CM

## 2019-12-14 MED ORDER — QUETIAPINE FUMARATE 300 MG PO TABS: 300.0000 mg | ORAL_TABLET | Freq: Every day | ORAL | 1 refills | Status: DC

## 2019-12-14 MED ORDER — GABAPENTIN 400 MG PO CAPS: 400.0000 mg | ORAL_CAPSULE | Freq: Three times a day (TID) | ORAL | 1 refills | Status: DC

## 2019-12-14 MED ORDER — CITALOPRAM HYDROBROMIDE 20 MG PO TABS: 20.0000 mg | ORAL_TABLET | Freq: Every day | ORAL | 1 refills | Status: DC

## 2019-12-14 MED ORDER — ALPRAZOLAM 1 MG PO TABS: 1.0000 mg | ORAL_TABLET | Freq: Three times a day (TID) | ORAL | 1 refills | Status: DC | PRN

## 2019-12-14 NOTE — Progress Notes (Signed)
[place] MD/PA/NP OP Progress Note  12/14/2019 11:36 AM Mackenzie Gonzalez  MRN:  [id number]  Chief Complaint: " I am doing well."  HPI: Though the patient initially stated that she is doing well, however, she endorses anxiety symptoms including feeling anxious, worrying about different things, and feeling restless.  She denies any recent panic attacks and reports that her Xanax medication is effective in reducing her anxiety.  However, she mentioned that she wishes that her Xanax dose could be increased.  Provider discussed in depth with the patient that an increase of her current Xanax dose is contraindicated.  Patient reports and verbalizes understanding.   Patient reports baseline paranoia and visual hallucinations.  She reports that sometimes she sees shadows at night time in the corner of the room and when she's at the grocery store she feels like people are watching her or she knows what they're thinking about her.  She reports that these are her baseline symptoms and that they are not worse or very bothersome to her.  Patient offered to have Seroquel increased at this time to alleviate these symptoms but patient has declined at this time.  Patient would like to maintain current dose of Seroquel at this time.  Patient reports that she is still grieving the loss of her cousin who passed about 2 or 3 months ago.  She reports that she misses him often and frequently reflects on her memories of him.  She reports that a few weeks ago she had a brief crying spell because she wanted to drink alcohol.  However, she reports that she abstained from using alcohol and has maintained her sobriety from alcohol use.  Patient denies any SI or HI.   Patient offered referral to therapy services to help process the recent death in her family and reported anxiety symptoms.  Patient declines therapy at this time but reports that she may be interested to further discuss this at her next appointment.   Patient denies any  additional concerns at this time.   Patient reports that her cousin's son has allowed her to continue to stay in the shed at his father's home.  She also reports that she offered to pay her cousin's son rent (about $200) to continue to stay there.  She reports that she feels safe living in the shed because she is close to her family in a safe neighborhood.  However, she reports that she plans to sell her moped soon so she can purchase a camper.     Visit Diagnosis:    ICD-10-CM   1. Schizoaffective disorder, bipolar type ([place])  F25.0 QUEtiapine (SEROQUEL) 300 MG tablet    citalopram (CELEXA) 20 MG tablet    gabapentin (NEURONTIN) 400 MG capsule  2. Anxiety disorder, unspecified type  F41.9 ALPRAZolam (XANAX) 1 MG tablet    Past Psychiatric History: Schizoaffective disorder, polysubstance abuse  Past Medical History:  Past Medical History:  Diagnosis Date  . Anxiety   . Bipolar 1 disorder ([place])   . Chronic bronchitis ([place])   . Hepatitis C carrier ([place])   . HIV (human immunodeficiency virus infection) ([place]) 2015  . Schizoaffective disorder [facility])     Past Surgical History:  Procedure Laterality Date  . BREAST EXCISIONAL BIOPSY Left 1987   benign  . SP ARTHRO WRIST*R*      Family Psychiatric History: Mother- bipolar d/o, father- alcohol abuse   Family History:  Family History  Problem Relation Age of Onset  . Psychiatric Illness Mother   .  Hepatitis Father   . Alcohol abuse Father   . Breast cancer Paternal Aunt     Social History:  Social History   Socioeconomic History  . Marital status: Single    Spouse name: Not on file  . Number of children: Not on file  . Years of education: Not on file  . Highest education level: Not on file  Occupational History  . Not on file  Tobacco Use  . Smoking status: Current Every Day Smoker    Packs/day: 1.00    Types: Cigarettes  . Smokeless tobacco: Never Used  Substance and Sexual Activity  . Alcohol use: Yes     Alcohol/week: 1.0 standard drink    Types: 1 Cans of beer per week    Comment: occ  . Drug use: Not Currently    Types: "Crack" cocaine  . Sexual activity: Not Currently    Partners: Female, Female  Other Topics Concern  . Not on file  Social History Narrative  . Not on file   Social Determinants of Health   Financial Resource Strain:   . Difficulty of Paying Living Expenses: Not on file  Food Insecurity:   . Worried About Charity fundraiser in the Last Year: Not on file  . Ran Out of Food in the Last Year: Not on file  Transportation Needs:   . Lack of Transportation (Medical): Not on file  . Lack of Transportation (Non-Medical): Not on file  Physical Activity:   . Days of Exercise per Week: Not on file  . Minutes of Exercise per Session: Not on file  Stress:   . Feeling of Stress : Not on file  Social Connections:   . Frequency of Communication with Friends and Family: Not on file  . Frequency of Social Gatherings with Friends and Family: Not on file  . Attends Religious Services: Not on file  . Active Member of Clubs or Organizations: Not on file  . Attends Archivist Meetings: Not on file  . Marital Status: Not on file    Allergies:  Allergies  Allergen Reactions  . Haloperidol   . Haloperidol Lactate     Metabolic Disorder Labs: Lab Results  Component Value Date   HGBA1C 5.3 07/26/2019   MPG 105.41 07/26/2019   No results found for: PROLACTIN Lab Results  Component Value Date   CHOL 193 07/26/2019   TRIG 87 07/26/2019   HDL 55 07/26/2019   CHOLHDL 3.5 07/26/2019   VLDL 17 07/26/2019   LDLCALC 121 (H) 07/26/2019   LDLCALC 100 (H) 04/20/2019   Lab Results  Component Value Date   TSH 1.726 07/26/2019    Therapeutic Level Labs: No results found for: LITHIUM No results found for: VALPROATE No components found for:  CBMZ  Current Medications: Current Outpatient Medications  Medication Sig Dispense Refill  . acetaminophen (TYLENOL) 500  MG tablet Take 2 tablets (1,000 mg total) by mouth every 8 (eight) hours as needed. 30 tablet 0  . albuterol (VENTOLIN HFA) 108 (90 Base) MCG/ACT inhaler Inhale 2 puffs into the lungs every 6 (six) hours as needed for wheezing or shortness of breath. 18 g 3  . ALPRAZolam (XANAX) 1 MG tablet Take 1 tablet (1 mg total) by mouth 3 (three) times daily as needed for anxiety. 90 tablet 1  . calcium-vitamin D (OSCAL WITH D) 500-200 MG-UNIT tablet Take 1 tablet by mouth daily with breakfast. 90 tablet 3  . citalopram (CELEXA) 20 MG tablet Take 1  tablet (20 mg total) by mouth daily. 30 tablet 1  . elvitegravir-cobicistat-emtricitabine-tenofovir (GENVOYA) 150-150-200-10 MG TABS tablet Take 1 tablet by mouth daily with breakfast. 90 tablet 3  . gabapentin (NEURONTIN) 400 MG capsule Take 1 capsule (400 mg total) by mouth 3 (three) times daily. 90 capsule 1  . Glecaprevir-Pibrentasvir (MAVYRET) 100-40 MG TABS Take 3 tablets by mouth daily with breakfast. 84 tablet 1  . ibuprofen (ADVIL) 800 MG tablet Take 800 mg by mouth at bedtime.    Marland Kitchen QUEtiapine (SEROQUEL) 300 MG tablet Take 1 tablet (300 mg total) by mouth at bedtime. 30 tablet 1   No current facility-administered medications for this visit.     Musculoskeletal: Strength & Muscle Tone: within normal limits Gait & Station: normal Patient leans: N/A  Psychiatric Specialty Exam: Review of Systems    Blood pressure 122/88, pulse 86, height 5\' 5"  (1.651 m), weight 186 lb (84.4 kg), SpO2 100 %.Body mass index is 30.95 kg/m.  General Appearance: Fairly Groomed  Eye Contact:  Good  Speech:  Clear and Coherent and Normal Rate, rambling about past events  Volume:  Normal  Mood:  Euthymic  Affect:  Congruent and somewhat animated  Thought Process:  Goal Directed and Descriptions of Associations: Circumstantial  Orientation:  Full (Time, Place, and Person)  Thought Content: Paranoid Ideation and Rumination   Suicidal Thoughts:  No  Homicidal Thoughts:   No  Memory:  Immediate;   Good Recent;   Good  Judgement:  Fair  Insight:  Fair  Psychomotor Activity:  Restlessness  Concentration:  Concentration: Good and Attention Span: Good  Recall:  Good  Fund of Knowledge: Good  Language: Good  Akathisia:  Negative  Handed:  Right  AIMS (if indicated): not done  Assets:  Communication Skills Desire for Improvement Financial Resources/Insurance  ADL's:  Intact  Cognition: WNL  Sleep:  Fair   Screenings: PHQ2-9     ED from 07/30/2019 in [place]  PHQ-2 Total Score 2  PHQ-9 Total Score 11       Assessment and Plan: Patient appears to be at her baseline. She is still living in her deceased cousin's shed and is working on finding a stable place to live.  1. Schizoaffective disorder, bipolar type ([place])  Continue - citalopram (CELEXA) 20 MG tablet; Take 1 tablet (20 mg total) by mouth daily.  Dispense: 30 tablet; Refill: 1 Continue - gabapentin (NEURONTIN) 400 MG capsule; Take 1 capsule (400 mg total) by mouth 3 (three) times daily.  Dispense: 90 capsule; Refill: 1 Continue - QUEtiapine (SEROQUEL) 300 MG tablet; Take 1 tablet (300 mg total) by mouth at bedtime.  Dispense: 30 tablet; Refill: 1  2. Anxiety disorder, unspecified type   ALPRAZolam (XANAX) 1 MG tablet; Take 1 tablet (1 mg total) by mouth 3 (three) times daily as needed for anxiety.  Dispense: 90 tablet; Refill: 1  Continue same regimen. Follow-up in 2 months. Pt is working with coordinator from [employer] regarding housing.   [place] Crane, MD 12/14/2019, 11:36 AM

## 2019-12-15 ENCOUNTER — Ambulatory Visit: Payer: Medicaid Other | Admitting: Student

## 2019-12-15 ENCOUNTER — Encounter: Payer: Self-pay | Admitting: Student

## 2019-12-15 VITALS — BP 105/61 | HR 82 | Temp 98.2°F | Wt 191.0 lb

## 2019-12-15 DIAGNOSIS — J410 Simple chronic bronchitis: Secondary | ICD-10-CM

## 2019-12-15 DIAGNOSIS — M2550 Pain in unspecified joint: Secondary | ICD-10-CM | POA: Diagnosis not present

## 2019-12-15 DIAGNOSIS — N951 Menopausal and female climacteric states: Secondary | ICD-10-CM

## 2019-12-15 DIAGNOSIS — Z720 Tobacco use: Secondary | ICD-10-CM | POA: Diagnosis not present

## 2019-12-15 DIAGNOSIS — R29898 Other symptoms and signs involving the musculoskeletal system: Secondary | ICD-10-CM

## 2019-12-15 DIAGNOSIS — Z23 Encounter for immunization: Secondary | ICD-10-CM

## 2019-12-15 DIAGNOSIS — Z0000 Encounter for general adult medical examination without abnormal findings: Secondary | ICD-10-CM

## 2019-12-15 HISTORY — DX: Menopausal and female climacteric states: N95.1

## 2019-12-15 MED ORDER — ALBUTEROL SULFATE HFA 108 (90 BASE) MCG/ACT IN AERS: 2.0000 | INHALATION_SPRAY | Freq: Four times a day (QID) | RESPIRATORY_TRACT | 6 refills | Status: DC | PRN

## 2019-12-15 MED ORDER — VITAMIN E 45 MG (100 UNIT) PO CAPS: 100.0000 [IU] | ORAL_CAPSULE | Freq: Every day | ORAL | 5 refills | Status: DC

## 2019-12-15 NOTE — Assessment & Plan Note (Signed)
Went to menopause in her late 16s and since then has had hot flashes every day which has improved mildly over time. Gets hot flashes every day on and off throughout the day, and also has significant nighttime symptoms. Also notes irritability associated with it. Not a candidate for immunotherapy due to active hepatitis C infection.  Is already on some medications that are used for nonhormonal treatment including gabapentin and citalopram. No history of breast cancer, uterine cancer, normal uterine bleeding, coronary artery disease, VTE, stroke. 4.0% 10-year ASCVD risk.  -Advised to try gabapentin at nighttime -Continue citalopram for his affective bipolar disorder -Start vitamin D 100 units daily -Counseled on smoking cessation

## 2019-12-15 NOTE — Patient Instructions (Signed)
Thank you for allowing [place] to be a part of your care today, it was a pleasure seeing you. We discussed your joint pain, bronchitis, hot flashes  I am checking these labs: ANA, ESR, CRP, CCP, rheumatoid factor, fecal occult blood  I have made these changes to your medications: Start vitamin D 1 mg daily Refilled albuterol  For your hot flashes, try taking the gabapentin at nighttime before bed. We'll start you on vitamin D which may help with symptoms. We cannot start hormonal therapy due to your hepatitis infection, we may start it later if this resolves.  For your joint pain, try using ibuprofen as needed. Do not exceed the recommended amounts on the bottle. I'm checking several labs, may refer you to rheumatology if they are abnormal.  For your bronchitis, continue using butyryl as needed. If this worsening, we can address this more formally.  Please follow up in 3-6 months   Thank you, and please call the Internal Medicine Clinic at [phone] if you have any questions.  Best, Dr. Bridgett Larsson

## 2019-12-15 NOTE — Assessment & Plan Note (Addendum)
At times feels SOB with tightness and wheezing. Requires albuterol roughly 3-4 times a week. Has a 24-pack-year smoking history and has decreased recently. Offered more formal work-up, but she declined at this time. Feels that symptoms are adequately controlled with albuterol alone. Discussed that if things worsen, we can address more formally and there are other medications which can help.  -Refilled albuterol -Counseled smoking cessation

## 2019-12-15 NOTE — Assessment & Plan Note (Signed)
Patient complains of pain in multiple joints including bilateral fingers, wrist, shoulders, hips ongoing for many years. The pain is worse in the morning along with stiffness. Also has vitiligo for many years. No fevers, weight loss, other rashes. Symptoms most consistent with rheumatoid arthritis or other rheumatic disease.  -Check ESR, CRP, ANA, CCP, RF -Consider referral to rheumatology pending above results

## 2019-12-15 NOTE — Assessment & Plan Note (Signed)
She continues to smoke about half a pack per day. Endorsed a very long history going back to around age 59. Is not ready to quit at this time, but counseled on decreasing use.  -Counseled smoking cessation

## 2019-12-15 NOTE — Assessment & Plan Note (Signed)
-  Flu vaccine administered -Pap smear deferred, reports no prior abnormal smears -Colonoscopy - FIT test ordered

## 2019-12-16 ENCOUNTER — Encounter: Payer: Medicaid Other | Admitting: Infectious Diseases

## 2019-12-17 LAB — RHEUMATOID FACTOR: Rheumatoid fact SerPl-aCnc: <10 IU/mL (ref 0.0–13.9)

## 2019-12-17 LAB — SEDIMENTATION RATE: Sed Rate: 10 mm/hr (ref 0–40)

## 2019-12-17 LAB — C-REACTIVE PROTEIN: CRP: 2 mg/L (ref 0–10)

## 2019-12-17 LAB — ANTINUCLEAR ANTIBODIES, IFA: ANA Titer 1: NEGATIVE

## 2019-12-17 LAB — CYCLIC CITRUL PEPTIDE ANTIBODY, IGG/IGA: Cyclic Citrullin Peptide Ab: <1 units (ref 0–19)

## 2019-12-17 NOTE — Progress Notes (Signed)
Attempted call to patient x2 regarding these results. Unfortunately her voicemail box is not set up. Will try again later.

## 2019-12-17 NOTE — Progress Notes (Signed)
Negative testing for inflammation or rheumatic markers so far. Will order hand x-rays. If erosions or other suspicious finding will refer to rheum. Otherwise will likely treat as osteoarthritis.

## 2019-12-17 NOTE — Progress Notes (Signed)
Attempted to call x4 today but no answer, I will try again on monday

## 2019-12-17 NOTE — Progress Notes (Signed)
Internal Medicine Clinic Attending  I saw and evaluated the patient.  I personally confirmed the key portions of the history and exam documented by Dr. Chen and I reviewed pertinent patient test results.  The assessment, diagnosis, and plan were formulated together and I agree with the documentation in the resident's note.  

## 2019-12-28 ENCOUNTER — Encounter: Payer: Medicaid Other | Admitting: Infectious Diseases

## 2020-01-05 ENCOUNTER — Encounter (INDEPENDENT_AMBULATORY_CARE_PROVIDER_SITE_OTHER): Payer: Self-pay

## 2020-01-07 ENCOUNTER — Ambulatory Visit (INDEPENDENT_AMBULATORY_CARE_PROVIDER_SITE_OTHER): Payer: Medicaid Other | Admitting: Infectious Diseases

## 2020-01-07 ENCOUNTER — Encounter: Payer: Self-pay | Admitting: Infectious Diseases

## 2020-01-07 ENCOUNTER — Other Ambulatory Visit: Payer: Self-pay

## 2020-01-07 VITALS — BP 111/79 | HR 89 | Temp 98.2°F | Ht 65.0 in | Wt 188.0 lb

## 2020-01-07 DIAGNOSIS — B182 Chronic viral hepatitis C: Secondary | ICD-10-CM

## 2020-01-07 DIAGNOSIS — Z113 Encounter for screening for infections with a predominantly sexual mode of transmission: Secondary | ICD-10-CM | POA: Diagnosis present

## 2020-01-07 DIAGNOSIS — K5903 Drug induced constipation: Secondary | ICD-10-CM | POA: Diagnosis not present

## 2020-01-07 DIAGNOSIS — B20 Human immunodeficiency virus [HIV] disease: Secondary | ICD-10-CM | POA: Diagnosis not present

## 2020-01-07 DIAGNOSIS — K5900 Constipation, unspecified: Secondary | ICD-10-CM | POA: Insufficient documentation

## 2020-01-07 DIAGNOSIS — Z720 Tobacco use: Secondary | ICD-10-CM | POA: Diagnosis not present

## 2020-01-07 DIAGNOSIS — Z79899 Other long term (current) drug therapy: Secondary | ICD-10-CM | POA: Diagnosis not present

## 2020-01-07 DIAGNOSIS — F250 Schizoaffective disorder, bipolar type: Secondary | ICD-10-CM | POA: Diagnosis not present

## 2020-01-07 DIAGNOSIS — J410 Simple chronic bronchitis: Secondary | ICD-10-CM

## 2020-01-07 MED ORDER — DOCUSATE SODIUM 100 MG PO CAPS: 100.0000 mg | ORAL_CAPSULE | Freq: Two times a day (BID) | ORAL | 0 refills | Status: DC

## 2020-01-07 NOTE — Assessment & Plan Note (Signed)
She will need a f/u u/s at her next visit due to [facility].  Appears to be cured I have cautioned her against further drug use and potential for re-infection.

## 2020-01-07 NOTE — Assessment & Plan Note (Signed)
Encouraged to quit, she is aware.  Very difficult with underlying psych d/o.

## 2020-01-07 NOTE — Assessment & Plan Note (Signed)
>>  ASSESSMENT AND PLAN FOR HIV DISEASE WRITTEN ON 01/07/2020 11:35 AM BY HATCHER, JEFFREY C, MD  Unable to get food stamps due to "old drug charge" Will give her food from pantry.  She is doing well on genvoya.  Not sexually active.  Believes she has gotten COVID vax, flu vax.  rtc in 9 months with lab prior.

## 2020-01-07 NOTE — Assessment & Plan Note (Signed)
Unable to get food stamps due to "old drug charge" Will give her food from pantry.  She is doing well on genvoya.  Not sexually active.  Believes she has gotten COVID vax, flu vax.  rtc in 9 months with lab prior.

## 2020-01-07 NOTE — Patient Instructions (Signed)
Mobile Crisis: [phone] [place]: [place] Suicide Prevention: [phone]

## 2020-01-07 NOTE — Progress Notes (Signed)
   Subjective:    Patient ID: Mackenzie Gonzalez, female    DOB: [birth year], 59 y.o.   MRN: [id number]  HPI 59yo F with hx of HIV+ for 6 years. Was previously cared for in [place]. Had random screen, asx. No hospitalizations.  Hep C+ for 20+ years.  She is currently on genvoya.  She was ordered [facility] 09-02-19. She has completed this.  Her Hep C was und 11-2019.  Hx of bipolar schizoaffective d/o, anxiety. Still off seroquel.   Initially here from [place], Arrested March 10. She had first visit to [facility] 04-20-2019. Released April 13.  She was adm to mental health center (6-28 to 07-27-19). She describes some SI today and Karn Pickler has offered to take her to mental health center today. Last seen 12-14-19. She has f/u 02-15-20. She declines visit to mental health today. She is given mobile crisis/hotline numbers and they are explained to her. She has had multiple interactions with Karn Pickler this week, this is her first mention of this.  Continues to take xanax but decreased from what her rx states. She also misses her other psych meds as she attributes constipation to these.  She has assitance from [place]. She is now qualified for Section 8 voucher. Shelter+ Seen by PCP last month for OA.   Mammo 09-2019: Revised impression: Benign, stable breast masses consistent with fibroadenomas. No evidence of breast malignancy. Revised recommendation: Screening mammogram in one  HIV 1 RNA Quant  Date Value  11/30/2019 28 Copies/mL (H)  07/27/2019 42 copies/mL (H)  04/20/2019 4,030 copies/mL (H)   CD4 T Cell Abs (/uL)  Date Value  11/30/2019 733  07/27/2019 564  04/20/2019 715    Review of Systems  Constitutional: Negative for appetite change and unexpected weight change.  Gastrointestinal: Positive for constipation. Negative for diarrhea.  Genitourinary: Negative for difficulty urinating.  Musculoskeletal: Positive for arthralgias (broke R ankle after falling off porch. was seen in ED. ).   Psychiatric/Behavioral: Positive for dysphoric mood and suicidal ideas. Negative for sleep disturbance.       Objective:   Physical Exam Vitals reviewed.  Constitutional:      Appearance: Normal appearance. She is obese.  HENT:     Mouth/Throat:     Mouth: Mucous membranes are moist.     Pharynx: No oropharyngeal exudate.  Eyes:     Extraocular Movements: Extraocular movements intact.     Pupils: Pupils are equal, round, and reactive to light.  Cardiovascular:     Rate and Rhythm: Normal rate and regular rhythm.  Pulmonary:     Effort: Pulmonary effort is normal.     Breath sounds: Normal breath sounds.  Abdominal:     General: Bowel sounds are normal. There is no distension.     Palpations: Abdomen is soft.     Tenderness: There is no abdominal tenderness.  Musculoskeletal:     Cervical back: Normal range of motion and neck supple.     Right lower leg: No edema.     Left lower leg: No edema.  Neurological:     General: No focal deficit present.     Mental Status: She is alert.  Psychiatric:     Comments: She expresses suicidal ideas. She contracts to call emergency/mobile psych if issues persist.             Assessment & Plan:

## 2020-01-07 NOTE — Assessment & Plan Note (Signed)
Encouraged her to drink more water rx for colace

## 2020-01-07 NOTE — Assessment & Plan Note (Signed)
She has psych f/u.  Appreciate Mitch's f/u.  Contracts for safety.

## 2020-01-11 LAB — HEPATITIS C RNA QUANTITATIVE
HCV Quantitative Log: <1.18 log IU/mL
HCV RNA, PCR, QN: <15 IU/mL

## 2020-01-13 ENCOUNTER — Telehealth (HOSPITAL_COMMUNITY): Payer: Self-pay | Admitting: *Deleted

## 2020-01-13 ENCOUNTER — Other Ambulatory Visit (HOSPITAL_COMMUNITY): Payer: Self-pay | Admitting: Psychiatry

## 2020-01-13 DIAGNOSIS — F250 Schizoaffective disorder, bipolar type: Secondary | ICD-10-CM

## 2020-01-13 DIAGNOSIS — F419 Anxiety disorder, unspecified: Secondary | ICD-10-CM

## 2020-01-13 MED ORDER — GABAPENTIN 400 MG PO CAPS: 400.0000 mg | ORAL_CAPSULE | Freq: Three times a day (TID) | ORAL | 1 refills | Status: DC

## 2020-01-13 MED ORDER — ALPRAZOLAM 1 MG PO TABS: 1.0000 mg | ORAL_TABLET | Freq: Three times a day (TID) | ORAL | 1 refills | Status: DC | PRN

## 2020-01-13 MED ORDER — QUETIAPINE FUMARATE 300 MG PO TABS: 300.0000 mg | ORAL_TABLET | Freq: Every day | ORAL | 1 refills | Status: DC

## 2020-01-13 MED ORDER — CITALOPRAM HYDROBROMIDE 20 MG PO TABS: 20.0000 mg | ORAL_TABLET | Freq: Every day | ORAL | 1 refills | Status: DC

## 2020-01-13 MED FILL — CITALOPRAM HBR 20 MG TABLET: 20 | 30 days supply | Qty: 30 | Fill #0

## 2020-01-13 MED FILL — ALPRAZOLAM 1 MG TABS: 1 | 30 days supply | Qty: 90 | Fill #0

## 2020-01-13 MED FILL — GABAPENTIN 400 MG CAPSULE: 400 | 30 days supply | Qty: 90 | Fill #0

## 2020-01-13 MED FILL — QUETIAPINE FUMARATE 300 MG: 300 | 30 days supply | Qty: 30 | Fill #0

## 2020-01-13 NOTE — Addendum Note (Signed)
Addended by: [place] Crane on: 01/13/2020 04:51 PM   Modules accepted: Orders

## 2020-01-13 NOTE — Telephone Encounter (Signed)
Call from patient asking that all of her medicine be transferred to [facility]. She states her case worker asked her to move it because they would deliver her medicines to her. Will  Ask Dr Toy Care to move her medicines.

## 2020-01-13 NOTE — Telephone Encounter (Signed)
Done

## 2020-01-14 ENCOUNTER — Ambulatory Visit (HOSPITAL_COMMUNITY)
Admission: EM | Admit: 2020-01-14 | Discharge: 2020-01-14 | Disposition: A | Discharge status: Home / Self Care | Payer: Medicaid Other | Attending: Emergency Medicine | Admitting: Emergency Medicine

## 2020-01-14 ENCOUNTER — Encounter (HOSPITAL_COMMUNITY): Payer: Self-pay

## 2020-01-14 ENCOUNTER — Ambulatory Visit (INDEPENDENT_AMBULATORY_CARE_PROVIDER_SITE_OTHER): Payer: Medicaid Other | Admitting: Internal Medicine

## 2020-01-14 ENCOUNTER — Telehealth (HOSPITAL_COMMUNITY): Payer: Self-pay | Admitting: Psychiatry

## 2020-01-14 ENCOUNTER — Other Ambulatory Visit: Payer: Self-pay

## 2020-01-14 DIAGNOSIS — J3489 Other specified disorders of nose and nasal sinuses: Secondary | ICD-10-CM

## 2020-01-14 DIAGNOSIS — J0100 Acute maxillary sinusitis, unspecified: Secondary | ICD-10-CM

## 2020-01-14 DIAGNOSIS — J340 Abscess, furuncle and carbuncle of nose: Secondary | ICD-10-CM | POA: Diagnosis not present

## 2020-01-14 HISTORY — DX: Other specified disorders of nose and nasal sinuses: J34.89

## 2020-01-14 MED ORDER — CEPHALEXIN 500 MG PO CAPS: 500.0000 mg | ORAL_CAPSULE | Freq: Four times a day (QID) | ORAL | 0 refills | Status: DC

## 2020-01-14 NOTE — Progress Notes (Signed)
  [facility] Internal Medicine Residency Telephone Encounter   HPI:  This telephone encounter was created for [name] on 01/14/2020 for the following purpose/cc nose infection  .71 yof with HIV who requested a telehealth visit for evaluation of 4d history of a left nasal wound with swelling. She notes that she has developed left eye pain and a headache over the past couple of days. The eye pain is exacerbated by eye movement. She has been having subjective fever and chills. She has been feeling fatigued and had a poor appetite over this course of time.   Past Medical History:  Past Medical History:  Diagnosis Date  . Anxiety   . Bipolar 1 disorder ([place])   . Chronic bronchitis ([place])   . Hepatitis C carrier ([place])   . HIV (human immunodeficiency virus infection) ([place]) 2015  . Schizoaffective disorder ([place])       ROS:  Review of Systems  Constitutional: Positive for chills, fever and malaise/fatigue.  HENT: Positive for sinus pain.   Eyes: Positive for blurred vision, pain and discharge.  Neurological: Positive for headaches.      Assessment / Plan / Recommendations:  Infection of the left nare with symptoms highly concerning for orbital cellulitis This was a telehealth encounter for the purpose of evaluation of 4d history of a left nare infection that has progressed to eye pain associated with movement. +fever and chills. Highly concerning for an orbital cellulitis although may be limited to preseptal cellulitis.  Discussed the limited evaluation that can be performed via telehealth.  Given the high suspicion for orbital cellultiis, she was instructed to seek immediate evaluation in the ED which she agreed to. I personally called the ED to make them aware of her and of my concern.    Consent and Medical Decision Making:   Patient discussed with Dr. Rebeca Alert  This is a telephone encounter between Mackenzie Gonzalez and Jorrell Kuster on 01/14/2020 for left nare infection  and eye pain. The visit was conducted with the patient located at home and [employer] at [facility]. The patient's identity was confirmed using their DOB and current address. The patient has consented to being evaluated through a telephone encounter and understands the associated risks (an examination cannot be done and the patient may need to come in for an appointment) / benefits (allows the patient to remain at home, decreasing exposure to coronavirus). I personally spent 20 minutes on medical discussion.    Mitzi Hansen, MD Internal Medicine Resident PGY-2 [facility] Res[facility] Pager: [phone] 01/14/2020 11:03 AM

## 2020-01-14 NOTE — Discharge Instructions (Addendum)
Take the antibiotic as directed.    Go to the emergency department if you have worsening symptoms.    Follow-up with your primary care provider on Monday.

## 2020-01-14 NOTE — ED Provider Notes (Signed)
[place]    CSN: [id number] Arrival date & time: 01/14/20  1700      History   Chief Complaint Chief Complaint  Patient presents with  . nose pain  . Headache    HPI Mackenzie Gonzalez is a 59 y.o. female.   Patient presents with 4-day history of pain and swelling in her nose.  She also reports left maxillary sinus pressure, subjective low grade fever, chills, fatigue, decreased appetite, headache, and left eye pain x2 days but these symptoms have improved today.  She denies current eye pain.  She had an E-visit with her PCP today and was instructed to go to the ED.  Her medical history includes HIV, hepatitis C, schizoaffective disorder, bipolar 1 disorder, tobacco abuse, chronic pain, anxiety.  The history is provided by the patient and medical records.    Past Medical History:  Diagnosis Date  . Anxiety   . Bipolar 1 disorder ([place])   . Chronic bronchitis ([place])   . Hepatitis C carrier ([place])   . HIV (human immunodeficiency virus infection) ([place]) 2015  . Schizoaffective disorder [facility])     Patient Active Problem List   Diagnosis Date Noted  . Suspected orbital vs preseptal cellultiis 01/14/2020  . Constipation 01/07/2020  . Vasomotor symptoms due to menopause 12/15/2019  . Pain in joint involving multiple sites 12/15/2019  . Encounter for preventive care 12/15/2019  . Simple chronic bronchitis ([place]) 09/02/2019  . Anxiety disorder 07/12/2019  . Chronic pain 05/25/2019  . Hepatitis B immune 04/20/2019  . Hepatitis A immune 04/20/2019  . Chronic viral hepatitis C ([place]) 04/20/2019  . HIV disease ([place]) 04/20/2019  . Tobacco abuse 04/20/2019  . Schizoaffective disorder, bipolar type ([place]) 04/20/2019    Past Surgical History:  Procedure Laterality Date  . BREAST EXCISIONAL BIOPSY Left 1987   benign  . SP ARTHRO WRIST*R*      OB History   No obstetric history on file.      Home Medications    Prior to Admission medications   Medication Sig Start  Date End Date Taking? Authorizing Provider  albuterol (VENTOLIN HFA) 108 (90 Base) MCG/ACT inhaler Inhale 2 puffs into the lungs every 6 (six) hours as needed for wheezing or shortness of breath. 12/15/19   Andrew Au, MD  ALPRAZolam Duanne Moron) 1 MG tablet Take 1 tablet (1 mg total) by mouth 3 (three) times daily as needed for anxiety. 01/13/20   Nevada Crane, MD  calcium-vitamin D (OSCAL WITH D) 500-200 MG-UNIT tablet Take 1 tablet by mouth daily with breakfast. 09/12/19   Kerin Perna, NP  cephALEXin (KEFLEX) 500 MG capsule Take 1 capsule (500 mg total) by mouth 4 (four) times daily. 01/14/20   Sharion Balloon, NP  citalopram (CELEXA) 20 MG tablet Take 1 tablet (20 mg total) by mouth daily. 01/13/20   Nev[place] Crane, MD  docusate sodium (COLACE) 100 MG capsule Take 1 capsule (100 mg total) by mouth 2 (two) times daily. 01/07/20   Campbell Riches, MD  elvitegravir-cobicistat-emtricitabine-tenofovir (GENVOYA) 150-150-200-10 MG TABS tablet Take 1 tablet by mouth daily with breakfast. 10/27/19   Golden Circle, FNP  gabapentin (NEURONTIN) 400 MG capsule Take 1 capsule (400 mg total) by mouth 3 (three) times daily. 01/13/20   Nevada Crane, MD  Glecaprevir-Pibrentasvir (MAVYRET) 100-40 MG TABS Take 3 tablets by mouth daily with breakfast. Patient not taking: Reported on 01/07/2020 09/02/19   Campbell Riches, MD  ibuprofen (ADVIL) 800 MG tablet Take  800 mg by mouth at bedtime. 06/23/19   [provider]  PREVIDENT 5000 BOOSTER PLUS 1.1 % PSTE Take by mouth. 11/03/19   [provider]  QUEtiapine (SEROQUEL) 300 MG tablet Take 1 tablet (300 mg total) by mouth at bedtime. 01/13/20   Nevada Crane, MD  vitamin E 45 MG (100 UNITS) capsule Take 1 capsule (100 Units total) by mouth daily. 12/15/19   Andrew Au, MD    Family History Family History  Problem Relation Age of Onset  . Psychiatric Illness Mother   . Hepatitis Father   . Alcohol abuse Father   . Breast cancer  Paternal Aunt     Social History Social History   Tobacco Use  . Smoking status: Current Every Day Smoker    Packs/day: 1.00    Years: 48.00    Pack years: 48.00    Types: Cigarettes  . Smokeless tobacco: Never Used  Substance Use Topics  . Alcohol use: Not Currently  . Drug use: Not Currently    Types: "Crack" cocaine, IV     Allergies   Haloperidol and Haloperidol lactate   Review of Systems Review of Systems  Constitutional: Positive for appetite change, chills, fatigue and fever.  HENT: Positive for sinus pressure. Negative for ear pain and sore throat.   Eyes: Positive for pain. Negative for visual disturbance.  Respiratory: Negative for cough and shortness of breath.   Cardiovascular: Negative for chest pain and palpitations.  Gastrointestinal: Negative for abdominal pain and vomiting.  Genitourinary: Negative for dysuria and hematuria.  Musculoskeletal: Negative for arthralgias and back pain.  Skin: Positive for color change and wound.  Neurological: Positive for headaches. Negative for seizures and syncope.  All other systems reviewed and are negative.    Physical Exam Triage Vital Signs ED Triage Vitals  Enc Vitals Group     BP 01/14/20 1819 (!) 145/93     Pulse Rate 01/14/20 1819 94     Resp 01/14/20 1819 20     Temp 01/14/20 1819 99.2 F (37.3 C)     Temp Source 01/14/20 1818 Oral     SpO2 01/14/20 1819 100 %     Weight --      Height --      Head Circumference --      Peak Flow --      Pain Score 01/14/20 1814 9     Pain Loc --      Pain Edu? --      Excl. in [place]? --    No data found.  Updated Vital Signs BP (!) 145/93 (BP Location: Left Arm)   Pulse 94   Temp 99.2 F (37.3 C) (Oral)   Resp 20   SpO2 100%   Visual Acuity Right Eye Distance:   Left Eye Distance:   Bilateral Distance:    Right Eye Near:   Left Eye Near:    Bilateral Near:     Physical Exam Vitals and nursing note reviewed.  Constitutional:      General: She is  not in acute distress.    Appearance: She is well-developed and well-nourished. She is not ill-appearing.  HENT:     Head: Normocephalic and atraumatic.     Right Ear: Tympanic membrane normal.     Left Ear: Tympanic membrane normal.     Nose: Congestion present. No rhinorrhea.     Comments: No lesions in nares.     Mouth/Throat:     Mouth: Mucous membranes  are moist.     Pharynx: Oropharynx is clear.  Eyes:     Extraocular Movements: Extraocular movements intact.     Conjunctiva/sclera: Conjunctivae normal.     Pupils: Pupils are equal, round, and reactive to light.     Comments: No eye swelling or tenderness.   Cardiovascular:     Rate and Rhythm: Normal rate and regular rhythm.     Heart sounds: Normal heart sounds.  Pulmonary:     Effort: Pulmonary effort is normal. No respiratory distress.     Breath sounds: Normal breath sounds.  Abdominal:     Palpations: Abdomen is soft.     Tenderness: There is no abdominal tenderness.  Musculoskeletal:        General: No edema.     Cervical back: Neck supple.  Skin:    General: Skin is warm and dry.     Findings: Erythema and lesion present.     Comments: External nose erythematous with black scabbed lesion. No drainage. See picture.    Neurological:     General: No focal deficit present.     Mental Status: She is alert and oriented to person, place, and time.     Gait: Gait normal.  Psychiatric:        Mood and Affect: Mood and affect and mood normal.        Behavior: Behavior normal.        UC Treatments / Results  Labs (all labs ordered are listed, but only abnormal results are displayed) Labs Reviewed - No data to display  EKG   Radiology No results found.  Procedures Procedures (including critical care time)  Medications Ordered in UC Medications - No data to display  Initial Impression / Assessment and Plan / UC Course  I have reviewed the triage vital signs and the nursing notes.  Pertinent labs &  imaging results that were available during my care of the patient were reviewed by me and considered in my medical decision making (see chart for details).   Cellulitis of nose.  Acute sinusitis.  Patient refuses transfer to the ED.  Treating with Keflex.  Instructed her to go to the ED if she has acute worsening symptoms.  Instructed her to follow-up with her PCP on Monday.  She agrees to plan of care.   Final Clinical Impressions(s) / UC Diagnoses   Final diagnoses:  Cellulitis of nose  Acute non-recurrent maxillary sinusitis     Discharge Instructions     Take the antibiotic as directed.    Go to the emergency department if you have worsening symptoms.    Follow-up with your primary care provider on Monday.        ED Prescriptions    Medication Sig Dispense Auth. Provider   cephALEXin (KEFLEX) 500 MG capsule Take 1 capsule (500 mg total) by mouth 4 (four) times daily. 28 capsule Sharion Balloon, NP     PDMP not reviewed this encounter.   Sharion Balloon, NP 01/14/20 1912

## 2020-01-14 NOTE — Telephone Encounter (Signed)
All her prescriptions were sent to [facility] by me yesterday. Can you inform her that. Thanks.

## 2020-01-14 NOTE — Telephone Encounter (Signed)
Left a voice mail for patient this am re her medicines being called into her new pharmacy at [facility].

## 2020-01-14 NOTE — ED Triage Notes (Signed)
Pt in with c/o nose pain that is radiating to her head and left eye. States she is having headaches and has an infection in her nose.  Pt has applied warm compresses to her nose with minimal relief

## 2020-01-14 NOTE — Assessment & Plan Note (Signed)
This was a telehealth encounter for the purpose of evaluation of 4d history of a left nare infection that has progressed to eye pain associated with movement. +fever and chills. Highly concerning for an orbital cellulitis although may be limited to preseptal cellulitis.  Discussed the limited evaluation that can be performed via telehealth.  Given the high suspicion for orbital cellultiis, she was instructed to seek immediate evaluation in the ED which she agreed to. I personally called the ED to make them aware of her and of my concern.

## 2020-01-17 NOTE — Telephone Encounter (Signed)
THIS IS THE THIRD ENCOUNTER I AM RECEIVING FOR THE SAME THING. I SENT HER RX TO HER PREFERRED PHARMACY LAST WEEK. CAN YOU CONTACT THAT PHARMACY AND SEE IF THEY HAVE THE RX. THANKS.

## 2020-01-30 ENCOUNTER — Other Ambulatory Visit: Payer: Self-pay

## 2020-01-30 ENCOUNTER — Ambulatory Visit
Admission: EM | Admit: 2020-01-30 | Discharge: 2020-01-30 | Disposition: A | Discharge status: Home / Self Care | Payer: Medicaid Other | Attending: Family Medicine | Admitting: Family Medicine

## 2020-01-30 ENCOUNTER — Encounter: Payer: Self-pay | Admitting: Emergency Medicine

## 2020-01-30 DIAGNOSIS — J320 Chronic maxillary sinusitis: Secondary | ICD-10-CM

## 2020-01-30 DIAGNOSIS — J3489 Other specified disorders of nose and nasal sinuses: Secondary | ICD-10-CM

## 2020-01-30 DIAGNOSIS — R059 Cough, unspecified: Secondary | ICD-10-CM | POA: Diagnosis not present

## 2020-01-30 MED ORDER — FLUTICASONE PROPIONATE 50 MCG/ACT NA SUSP: 2.0000 | Freq: Every day | NASAL | 2 refills | Status: DC

## 2020-01-30 MED ORDER — DOXYCYCLINE HYCLATE 100 MG PO CAPS: 100.0000 mg | ORAL_CAPSULE | Freq: Two times a day (BID) | ORAL | 0 refills | Status: DC

## 2020-01-30 MED ORDER — ALBUTEROL SULFATE HFA 108 (90 BASE) MCG/ACT IN AERS: 1.0000 | INHALATION_SPRAY | Freq: Four times a day (QID) | RESPIRATORY_TRACT | 0 refills | Status: DC | PRN

## 2020-01-30 NOTE — ED Provider Notes (Signed)
[facility]    CSN: [id number] Arrival date & time: 01/30/20  1345      History   Chief Complaint Chief Complaint  Patient presents with  . Cough    HPI Mackenzie Gonzalez is a 60 y.o. female.   HPI   Patient was recently seen for an infection in her soft tissues of her nose.  She states it was red and very painful.  She took Keflex for several days.  It got better.  Now is coming back.  In addition she has sinus congestion, dry cough, sinus pressure and is feeling "out of sorts".  No fever chills.  Headache body ache.  No known Covid exposure mother and her sister are both sick with "sinus" and cough.  She is  Covid vaccinated.  Past Medical History:  Diagnosis Date  . Anxiety   . Bipolar 1 disorder ([place])   . Chronic bronchitis ([place])   . Hepatitis C carrier ([place])   . HIV (human immunodeficiency virus infection) ([place]) 2015  . Schizoaffective disorder [facility])     Patient Active Problem List   Diagnosis Date Noted  . Suspected orbital vs preseptal cellultiis 01/14/2020  . Constipation 01/07/2020  . Vasomotor symptoms due to menopause 12/15/2019  . Pain in joint involving multiple sites 12/15/2019  . Encounter for preventive care 12/15/2019  . Simple chronic bronchitis ([place]) 09/02/2019  . Anxiety disorder 07/12/2019  . Chronic pain 05/25/2019  . Hepatitis B immune 04/20/2019  . Hepatitis A immune 04/20/2019  . Chronic viral hepatitis C ([place]) 04/20/2019  . HIV disease ([place]) 04/20/2019  . Tobacco abuse 04/20/2019  . Schizoaffective disorder, bipolar type ([place]) 04/20/2019    Past Surgical History:  Procedure Laterality Date  . BREAST EXCISIONAL BIOPSY Left 1987   benign  . SP ARTHRO WRIST*R*      OB History   No obstetric history on file.      Home Medications    Prior to Admission medications   Medication Sig Start Date End Date Taking? Authorizing Provider  albuterol (VENTOLIN HFA) 108 (90 Base) MCG/ACT inhaler Inhale 1-2 puffs into the lungs every  6 (six) hours as needed for wheezing or shortness of breath. 01/30/20  Yes Raylene Everts, MD  doxycycline (VIBRAMYCIN) 100 MG capsule Take 1 capsule (100 mg total) by mouth 2 (two) times daily. 01/30/20  Yes Raylene Everts, MD  fluticasone [facility]) 50 MCG/ACT nasal spray Place 2 sprays into both nostrils daily. 01/30/20  Yes Raylene Everts, MD  albuterol (VENTOLIN HFA) 108 (90 Base) MCG/ACT inhaler Inhale 2 puffs into the lungs every 6 (six) hours as needed for wheezing or shortness of breath. 12/15/19   Andrew Au, MD  ALPRAZolam Duanne Moron) 1 MG tablet Take 1 tablet (1 mg total) by mouth 3 (three) times daily as needed for anxiety. 01/13/20   [place] Crane, MD  calcium-vitamin D (OSCAL WITH D) 500-200 MG-UNIT tablet Take 1 tablet by mouth daily with breakfast. 09/12/19   Kerin Perna, NP  citalopram (CELEXA) 20 MG tablet Take 1 tablet (20 mg total) by mouth daily. 01/13/20   [place] Crane, MD  docusate sodium (COLACE) 100 MG capsule Take 1 capsule (100 mg total) by mouth 2 (two) times daily. 01/07/20   Campbell Riches, MD  elvitegravir-cobicistat-emtricitabine-tenofovir (GENVOYA) 150-150-200-10 MG TABS tablet Take 1 tablet by mouth daily with breakfast. 10/27/19   Golden Circle, FNP  gabapentin (NEURONTIN) 400 MG capsule Take 1 capsule (400 mg total) by  mouth 3 (three) times daily. 01/13/20   Zena Amos, MD  Glecaprevir-Pibrentasvir (MAVYRET) 100-40 MG TABS Take 3 tablets by mouth daily with breakfast. Patient not taking: Reported on 01/07/2020 09/02/19   Ginnie Smart, MD  ibuprofen (ADVIL) 800 MG tablet Take 800 mg by mouth at bedtime. 06/23/19   [provider]  PREVIDENT 5000 BOOSTER PLUS 1.1 % PSTE Take by mouth. 11/03/19   [provider]  QUEtiapine (SEROQUEL) 300 MG tablet Take 1 tablet (300 mg total) by mouth at bedtime. 01/13/20   Zena Amos, MD  vitamin E 45 MG (100 UNITS) capsule Take 1 capsule (100 Units total) by mouth daily. 12/15/19    Remo Lipps, MD    Family History Family History  Problem Relation Age of Onset  . Psychiatric Illness Mother   . Hepatitis Father   . Alcohol abuse Father   . Breast cancer Paternal Aunt     Social History Social History   Tobacco Use  . Smoking status: Current Every Day Smoker    Packs/day: 1.00    Years: 48.00    Pack years: 48.00    Types: Cigarettes  . Smokeless tobacco: Never Used  Substance Use Topics  . Alcohol use: Not Currently  . Drug use: Not Currently    Types: "Crack" cocaine, IV     Allergies   Haloperidol and Haloperidol lactate   Review of Systems Review of Systems See HPI  Physical Exam Triage Vital Signs ED Triage Vitals  Enc Vitals Group     BP 01/30/20 1455 (!) 144/93     Pulse Rate 01/30/20 1455 80     Resp 01/30/20 1455 15     Temp 01/30/20 1455 98.6 F (37 C)     Temp src --      SpO2 01/30/20 1455 98 %     Weight 01/30/20 1518 187 lb (84.8 kg)     Height 01/30/20 1518 5\' 5"  (1.651 m)     Head Circumference --      Peak Flow --      Pain Score 01/30/20 1518 0     Pain Loc --      Pain Edu? --      Excl. in [place]? --    No data found.  Updated Vital Signs BP (!) 144/93   Pulse 80   Temp 98.6 F (37 C)   Resp 15   Ht 5\' 5"  (1.651 m)   Wt 84.8 kg   SpO2 98%   BMI 31.12 kg/m       Physical Exam Constitutional:      General: She is not in acute distress.    Appearance: She is well-developed and well-nourished.     Comments: No acute distress  HENT:     Head: Normocephalic and atraumatic.     Nose:     Comments: Patient does have a lesion on her nose, with umbilicated center and smooth edges, surrounding erythema.  It does look like the cellulitis is not quite gone away so we will cover her for MRSA.  I am concerned that the original lesion which has been present for months may be a basal cell carcinoma and she is advised to see her primary care doctor    Mouth/Throat:     Pharynx: Posterior oropharyngeal erythema  present.  Eyes:     Conjunctiva/sclera: Conjunctivae normal.     Pupils: Pupils are equal, round, and reactive to light.  Cardiovascular:  Rate and Rhythm: Normal rate and regular rhythm.     Heart sounds: Normal heart sounds.  Pulmonary:     Effort: Pulmonary effort is normal. No respiratory distress.     Breath sounds: Normal breath sounds.  Abdominal:     Palpations: Abdomen is soft.  Musculoskeletal:        General: No edema. Normal range of motion.     Cervical back: Normal range of motion.  Skin:    General: Skin is warm and dry.  Neurological:     Mental Status: She is alert.  Psychiatric:        Behavior: Behavior normal.      UC Treatments / Results  Labs (all labs ordered are listed, but only abnormal results are displayed) Labs Reviewed  COVID-19, FLU A+B NAA    EKG   Radiology No results found.  Procedures Procedures (including critical care time)  Medications Ordered in UC Medications - No data to display  Initial Impression / Assessment and Plan / UC Course  I have reviewed the triage vital signs and the nursing notes.  Pertinent labs & imaging results that were available during my care of the patient were reviewed by me and considered in my medical decision making (see chart for details).     Going to cover her with Vibramycin for the infection in her nose.  She did not have MRSA coverage with her last treatment with Keflex.  I am adding Flonase for her sinus congestion and albuterol refill is offered at her request.  Follow-up as needed Final Clinical Impressions(s) / UC Diagnoses   Final diagnoses:  Cough  Infected lesion in nose  Chronic maxillary sinusitis     Discharge Instructions     Drink plenty of fluids Take the medicine as directed Albuterol inhaler is refilled See your doctor about nose bump   ED Prescriptions    Medication Sig Dispense Auth. Provider   fluticasone (FLONASE) 50 MCG/ACT nasal spray Place 2 sprays into  both nostrils daily. 16 g Raylene Everts, MD   doxycycline (VIBRAMYCIN) 100 MG capsule Take 1 capsule (100 mg total) by mouth 2 (two) times daily. 14 capsule Raylene Everts, MD   albuterol (VENTOLIN HFA) 108 (90 Base) MCG/ACT inhaler Inhale 1-2 puffs into the lungs every 6 (six) hours as needed for wheezing or shortness of breath. 18 g Raylene Everts, MD     PDMP not reviewed this encounter.   Raylene Everts, MD 01/31/20 [id number]

## 2020-01-30 NOTE — Discharge Instructions (Addendum)
Drink plenty of fluids Take the medicine as directed Albuterol inhaler is refilled See your doctor about nose bump

## 2020-01-30 NOTE — ED Triage Notes (Signed)
Patient states that she has dry sinuses and dry cough, had recent infection on nose, feels confused sinus pressure

## 2020-02-01 ENCOUNTER — Telehealth: Payer: Self-pay

## 2020-02-01 NOTE — Telephone Encounter (Signed)
Received voicemail from patient inquiring about albuterol refills. RN called patient back and advised her that this is prescribed by Dr. Imogene Burn and Dr. Delton See. Patient states she has been in contact with Dr. Lindaann Slough office and they are working on either intimating a prior authorization request or possibly changing the medication. RN advised patient to follow up with pharmacy and Dr. Lindaann Slough office. Patient verbalized understanding and has no further questions.   Sandie Ano, RN

## 2020-02-02 LAB — COVID-19, FLU A+B NAA
Influenza A, NAA: NOT DETECTED
Influenza B, NAA: NOT DETECTED
SARS-CoV-2, NAA: DETECTED — AB

## 2020-02-07 NOTE — Progress Notes (Signed)
Internal Medicine Clinic Attending  Case discussed with Dr. Christian at the time of the visit.  We reviewed the resident's history and exam and pertinent patient test results.  I agree with the assessment, diagnosis, and plan of care documented in the resident's note.  Jamye Balicki, M.D., Ph.D.  

## 2020-02-15 ENCOUNTER — Other Ambulatory Visit: Payer: Self-pay

## 2020-02-15 ENCOUNTER — Encounter (HOSPITAL_COMMUNITY): Payer: Self-pay | Admitting: Psychiatry

## 2020-02-15 ENCOUNTER — Other Ambulatory Visit (HOSPITAL_COMMUNITY): Payer: Self-pay | Admitting: Psychiatry

## 2020-02-15 ENCOUNTER — Telehealth (INDEPENDENT_AMBULATORY_CARE_PROVIDER_SITE_OTHER): Payer: Medicaid Other | Admitting: Psychiatry

## 2020-02-15 DIAGNOSIS — F419 Anxiety disorder, unspecified: Secondary | ICD-10-CM

## 2020-02-15 DIAGNOSIS — F250 Schizoaffective disorder, bipolar type: Secondary | ICD-10-CM | POA: Diagnosis not present

## 2020-02-15 MED ORDER — CITALOPRAM HYDROBROMIDE 20 MG PO TABS: 20.0000 mg | ORAL_TABLET | Freq: Every day | ORAL | 2 refills | Status: DC

## 2020-02-15 MED ORDER — ALPRAZOLAM 1 MG PO TABS: 1.0000 mg | ORAL_TABLET | Freq: Three times a day (TID) | ORAL | 2 refills | Status: DC | PRN

## 2020-02-15 MED ORDER — QUETIAPINE FUMARATE 300 MG PO TABS: 300.0000 mg | ORAL_TABLET | Freq: Every day | ORAL | 2 refills | Status: DC

## 2020-02-15 MED ORDER — GABAPENTIN 400 MG PO CAPS: 400.0000 mg | ORAL_CAPSULE | Freq: Three times a day (TID) | ORAL | 2 refills | Status: DC

## 2020-02-15 MED FILL — QUETIAPINE FUMARATE 300 MG: 300 | 30 days supply | Qty: 30 | Fill #0

## 2020-02-15 MED FILL — GABAPENTIN 400 MG CAPSULE: 400 | 30 days supply | Qty: 90 | Fill #0

## 2020-02-15 MED FILL — ALPRAZOLAM 1 MG TABS: 1 | 30 days supply | Qty: 90 | Fill #0

## 2020-02-15 MED FILL — CITALOPRAM HBR 20 MG TABLET: 20 | 30 days supply | Qty: 30 | Fill #0

## 2020-02-15 NOTE — Progress Notes (Signed)
[place] MD/PA/NP OP Progress Note  Virtual Visit via Telephone Note  I connected with Mackenzie Gonzalez on 02/15/20 at 10:20 AM EST by telephone and verified that I am speaking with the correct person using two identifiers.  Location: Patient: home Provider: home office   I discussed the limitations, risks, security and privacy concerns of performing an evaluation and management service by telephone and the availability of in person appointments. I also discussed with the patient that there may be a patient responsible charge related to this service. The patient expressed understanding and agreed to proceed.   I provided 17 minutes of non-face-to-face time during this encounter.    02/15/2020 10:11 AM [name]  MRN:  [id number]  Chief Complaint: " I am getting over COVID."  HPI: Pt informed that she moved to live with her sister in [place] last month. She contracted COVID earlier this month. Her sister contracted it first and is still dealing with symptoms. Pt reported that since pt received vaccination last year her symptoms have not been too severe and she is started to feel better now. She informed that due to her illness she has not had the chance to look for places to live. She reported that her plan is to work on that over the next few days. She denied any exacerbation of her depressive symptoms. She reported baseline paranoid ideations but denied any exacerbation of psychotic symptoms. She denied any other concerns at this time. She informed that she changed her pharmacy last month and has started to received meds by mail which is very convenient for her. She requested for refills.   Visit Diagnosis:    ICD-10-CM   1. Schizoaffective disorder, bipolar type ([place])  F25.0   2. Anxiety disorder, unspecified type  F41.9     Past Psychiatric History: Schizoaffective disorder, polysubstance abuse  Past Medical History:  Past Medical History:  Diagnosis Date  . Anxiety   .  Bipolar 1 disorder ([place])   . Chronic bronchitis ([place])   . Hepatitis C carrier ([place])   . HIV (human immunodeficiency virus infection) ([place]) 2015  . Schizoaffective disorder [facility])     Past Surgical History:  Procedure Laterality Date  . BREAST EXCISIONAL BIOPSY Left 1987   benign  . SP ARTHRO WRIST*R*      Family Psychiatric History: Mother- bipolar d/o, father- alcohol abuse   Family History:  Family History  Problem Relation Age of Onset  . Psychiatric Illness Mother   . Hepatitis Father   . Alcohol abuse Father   . Breast cancer Paternal Aunt     Social History:  Social History   Socioeconomic History  . Marital status: Single    Spouse name: Not on file  . Number of children: Not on file  . Years of education: Not on file  . Highest education level: Not on file  Occupational History  . Not on file  Tobacco Use  . Smoking status: Current Every Day Smoker    Packs/day: 1.00    Years: 48.00    Pack years: 48.00    Types: Cigarettes  . Smokeless tobacco: Never Used  Substance and Sexual Activity  . Alcohol use: Not Currently  . Drug use: Not Currently    Types: "Crack" cocaine, IV  . Sexual activity: Not Currently    Partners: Female, Female  Other Topics Concern  . Not on file  Social History Narrative  . Not on file   Social Determinants of Health  Financial Resource Strain: Not on file  Food Insecurity: Not on file  Transportation Needs: Not on file  Physical Activity: Not on file  Stress: Not on file  Social Connections: Not on file    Allergies:  Allergies  Allergen Reactions  . Haloperidol   . Haloperidol Lactate     Metabolic Disorder Labs: Lab Results  Component Value Date   HGBA1C 5.3 07/26/2019   MPG 105.41 07/26/2019   No results found for: PROLACTIN Lab Results  Component Value Date   CHOL 193 07/26/2019   TRIG 87 07/26/2019   HDL 55 07/26/2019   CHOLHDL 3.5 07/26/2019   VLDL 17 07/26/2019   LDLCALC 121 (H) 07/26/2019    LDLCALC 100 (H) 04/20/2019   Lab Results  Component Value Date   TSH 1.726 07/26/2019    Therapeutic Level Labs: No results found for: LITHIUM No results found for: VALPROATE No components found for:  CBMZ  Current Medications: Current Outpatient Medications  Medication Sig Dispense Refill  . albuterol (VENTOLIN HFA) 108 (90 Base) MCG/ACT inhaler Inhale 2 puffs into the lungs every 6 (six) hours as needed for wheezing or shortness of breath. 18 g 6  . albuterol (VENTOLIN HFA) 108 (90 Base) MCG/ACT inhaler Inhale 1-2 puffs into the lungs every 6 (six) hours as needed for wheezing or shortness of breath. 18 g 0  . ALPRAZolam (XANAX) 1 MG tablet Take 1 tablet (1 mg total) by mouth 3 (three) times daily as needed for anxiety. 90 tablet 1  . calcium-vitamin D (OSCAL WITH D) 500-200 MG-UNIT tablet Take 1 tablet by mouth daily with breakfast. 90 tablet 3  . citalopram (CELEXA) 20 MG tablet Take 1 tablet (20 mg total) by mouth daily. 30 tablet 1  . docusate sodium (COLACE) 100 MG capsule Take 1 capsule (100 mg total) by mouth 2 (two) times daily. 10 capsule 0  . doxycycline (VIBRAMYCIN) 100 MG capsule Take 1 capsule (100 mg total) by mouth 2 (two) times daily. 14 capsule 0  . elvitegravir-cobicistat-emtricitabine-tenofovir (GENVOYA) 150-150-200-10 MG TABS tablet Take 1 tablet by mouth daily with breakfast. 90 tablet 3  . fluticasone (FLONASE) 50 MCG/ACT nasal spray Place 2 sprays into both nostrils daily. 16 g 2  . gabapentin (NEURONTIN) 400 MG capsule Take 1 capsule (400 mg total) by mouth 3 (three) times daily. 90 capsule 1  . Glecaprevir-Pibrentasvir (MAVYRET) 100-40 MG TABS Take 3 tablets by mouth daily with breakfast. (Patient not taking: Reported on 01/07/2020) 84 tablet 1  . ibuprofen (ADVIL) 800 MG tablet Take 800 mg by mouth at bedtime.    Marland Kitchen PREVIDENT 5000 BOOSTER PLUS 1.1 % PSTE Take by mouth.    . QUEtiapine (SEROQUEL) 300 MG tablet Take 1 tablet (300 mg total) by mouth at bedtime. 30  tablet 1  . vitamin E 45 MG (100 UNITS) capsule Take 1 capsule (100 Units total) by mouth daily. 30 capsule 5   No current facility-administered medications for this visit.     Musculoskeletal: Strength & Muscle Tone: within normal limits Gait & Station: normal Patient leans: N/A  Psychiatric Specialty Exam: Review of Systems    There were no vitals taken for this visit.There is no height or weight on file to calculate BMI.  General Appearance: unable to assess due to phone visit  Eye Contact:  unable to assess due to phone visit  Speech:  Clear and Coherent and Normal Rate  Volume:  Normal  Mood:  Euthymic  Affect:  Congruent  Thought  Process:  Goal Directed and Descriptions of Associations: Circumstantial  Orientation:  Full (Time, Place, and Person)  Thought Content: Paranoid Ideation, at baseline, denied hallucinations  Suicidal Thoughts:  No  Homicidal Thoughts:  No  Memory:  Immediate;   Good Recent;   Good  Judgement:  Fair  Insight:  Fair  Psychomotor Activity:  Normal  Concentration:  Concentration: Good and Attention Span: Good  Recall:  Good  Fund of Knowledge: Good  Language: Good  Akathisia:  Negative  Handed:  Right  AIMS (if indicated): not done  Assets:  Communication Skills Desire for Improvement Financial Resources/Insurance  ADL's:  Intact  Cognition: WNL  Sleep:  Fair   Screenings: PHQ2-9   [place] Office Visit from 01/14/2020 in [place] Office Visit from 01/07/2020 in [facility] for [facility]ious Disease Office Visit from 12/15/2019 in [facility] from 07/30/2019 in [place]  PHQ-2 Total Score 4 5 1 2   PHQ-9 Total Score 12 18 5 11        Assessment and Plan: Pt is recovering from COVID infection. Is now living with her sister, working on finding a place for herself to live.  1. Schizoaffective disorder, bipolar type ([place])  -  Continue citalopram (CELEXA) 20 MG tablet; Take 1 tablet (20 mg total) by mouth daily.  Dispense: 30 tablet; Refill: 1 - Continue gabapentin (NEURONTIN) 400 MG capsule; Take 1 capsule (400 mg total) by mouth 3 (three) times daily.  Dispense: 90 capsule; Refill: 1 - Continue QUEtiapine (SEROQUEL) 300 MG tablet; Take 1 tablet (300 mg total) by mouth at bedtime.  Dispense: 30 tablet; Refill: 1  2. Anxiety disorder, unspecified type  - Continue ALPRAZolam (XANAX) 1 MG tablet; Take 1 tablet (1 mg total) by mouth 3 (three) times daily as needed for anxiety.  Dispense: 90 tablet; Refill: 1  Continue same regimen. Follow-up in 10 weeks. Pt is working with coordinator from [employer] regarding housing.   [place] Crane, MD 02/15/2020, 10:11 AM

## 2020-03-13 MED FILL — CITALOPRAM HBR 20 MG TABLET: 20 | 30 days supply | Qty: 30 | Fill #1

## 2020-03-13 MED FILL — GABAPENTIN 400 MG CAPSULE: 400 | 30 days supply | Qty: 90 | Fill #1

## 2020-03-13 MED FILL — QUETIAPINE FUMARATE 300 MG: 300 | 30 days supply | Qty: 30 | Fill #1

## 2020-03-13 MED FILL — ALPRAZOLAM 1 MG TABS: 1 | 30 days supply | Qty: 90 | Fill #1

## 2020-03-15 ENCOUNTER — Other Ambulatory Visit: Payer: Self-pay | Admitting: Student

## 2020-03-15 ENCOUNTER — Encounter: Payer: Self-pay | Admitting: Student

## 2020-03-15 ENCOUNTER — Ambulatory Visit (INDEPENDENT_AMBULATORY_CARE_PROVIDER_SITE_OTHER): Payer: Medicaid Other | Admitting: Student

## 2020-03-15 ENCOUNTER — Other Ambulatory Visit: Payer: Self-pay

## 2020-03-15 VITALS — BP 133/85 | HR 97 | Temp 98.2°F | Ht 65.0 in | Wt 194.6 lb

## 2020-03-15 DIAGNOSIS — M25542 Pain in joints of left hand: Secondary | ICD-10-CM

## 2020-03-15 DIAGNOSIS — M25511 Pain in right shoulder: Secondary | ICD-10-CM

## 2020-03-15 DIAGNOSIS — R29898 Other symptoms and signs involving the musculoskeletal system: Secondary | ICD-10-CM

## 2020-03-15 DIAGNOSIS — M2550 Pain in unspecified joint: Secondary | ICD-10-CM

## 2020-03-15 DIAGNOSIS — M25552 Pain in left hip: Secondary | ICD-10-CM

## 2020-03-15 DIAGNOSIS — F250 Schizoaffective disorder, bipolar type: Secondary | ICD-10-CM | POA: Diagnosis not present

## 2020-03-15 DIAGNOSIS — M25531 Pain in right wrist: Secondary | ICD-10-CM

## 2020-03-15 DIAGNOSIS — M25512 Pain in left shoulder: Secondary | ICD-10-CM

## 2020-03-15 DIAGNOSIS — M25551 Pain in right hip: Secondary | ICD-10-CM

## 2020-03-15 DIAGNOSIS — N3946 Mixed incontinence: Secondary | ICD-10-CM | POA: Diagnosis present

## 2020-03-15 DIAGNOSIS — M25541 Pain in joints of right hand: Secondary | ICD-10-CM

## 2020-03-15 DIAGNOSIS — M25532 Pain in left wrist: Secondary | ICD-10-CM

## 2020-03-15 DIAGNOSIS — Z0000 Encounter for general adult medical examination without abnormal findings: Secondary | ICD-10-CM

## 2020-03-15 DIAGNOSIS — B029 Zoster without complications: Secondary | ICD-10-CM

## 2020-03-15 MED ORDER — DICLOFENAC SODIUM 1 % EX GEL: 4.0000 g | Freq: Four times a day (QID) | CUTANEOUS | 5 refills | Status: DC

## 2020-03-15 MED ORDER — VALACYCLOVIR HCL 1 G PO TABS: 1000.0000 mg | ORAL_TABLET | Freq: Three times a day (TID) | ORAL | 0 refills | Status: DC

## 2020-03-15 MED FILL — DICLOFENAC SODIUM 1 % GEL: 1 | 6 days supply | Qty: 100 | Fill #0

## 2020-03-15 MED FILL — valACYclovir HCL 1 GM TABS: 1 | 7 days supply | Qty: 21 | Fill #0

## 2020-03-15 NOTE — Progress Notes (Signed)
   CC: Arthritis, schizoaffective disorder, herpes zoster, incontinence  HPI:  [name] is a 60 y.o. female with history as below presenting for all up on the above. Please refer to problem based charting for further details of assessment and plan of current problem and chronic medical conditions.   Past Medical History:  Diagnosis Date  . Anxiety   . Bipolar 1 disorder ([place])   . Chronic bronchitis ([place])   . Hepatitis C carrier ([place])   . HIV (human immunodeficiency virus infection) ([place]) 2015  . Schizoaffective disorder ([place])    Review of Systems:   Review of Systems  Constitutional: Negative for chills and fever.  Genitourinary:       Positive incontinence  Musculoskeletal: Positive for joint pain.  Skin: Positive for rash.  Psychiatric/Behavioral: Negative for depression, hallucinations and suicidal ideas. The patient is nervous/anxious.   All other systems reviewed and are negative.    Physical Exam: Vitals:   03/15/20 1534  BP: 133/85  Pulse: 97  Temp: 98.2 F (36.8 C)  TempSrc: Oral  SpO2: 93%  Weight: 194 lb 9.6 oz (88.3 kg)  Height: 5\' 5"  (1.651 m)   Constitutional: no acute distress Head: atraumatic ENT: external ears normal Cardiovascular: regular rate Pulmonary: effort normal Abdominal: flat Musculoskeletal: Bilateral hands without clear effusion or inflammation, no swan-neck deformity or ulnar deviation, no nephric and tenderness, no Heberden or Bouchard nodes Skin: warm and dry Neurological: alert, no focal deficit Psychiatric: normal mood and affect  Assessment & Plan:   See Encounters Tab for problem based charting.  Patient discussed with Dr. Evette Doffing

## 2020-03-15 NOTE — Patient Instructions (Addendum)
Thank you for allowing [place] to be a part of your care today, it was a pleasure seeing you. We discussed your hand pain, herpes, incontinence  I am checking xrays of your hands for your pain. In the meantime, use tylenol and ibuprofen for pain. I am also prescribing Voltaren gel to be used as needed for pain. Apply to the affected areas.  I am prescribing Valacyclovir for future herpes episodes. Only take this when you have herpes flares, it will not be useful otherwise. Please call us if you need further refills.  For your incontinence, you do not seem to be retaining urine. I am sending you home with a voiding diary, please fill this out and bring it back. We will go from there with recommendations on how to treat this.  You may get a COVID booster at the end of March.  Please follow up in 6 months   Thank you, and please call the Internal Medicine Clinic at [phone] if you have any questions.  Best, Dr. Bridgett Larsson

## 2020-03-16 ENCOUNTER — Encounter: Payer: Self-pay | Admitting: Student

## 2020-03-16 DIAGNOSIS — N393 Stress incontinence (female) (male): Secondary | ICD-10-CM | POA: Insufficient documentation

## 2020-03-16 DIAGNOSIS — N3946 Mixed incontinence: Secondary | ICD-10-CM | POA: Insufficient documentation

## 2020-03-16 DIAGNOSIS — B029 Zoster without complications: Secondary | ICD-10-CM | POA: Insufficient documentation

## 2020-03-16 LAB — FECAL OCCULT BLOOD, IMMUNOCHEMICAL: Fecal Occult Bld: NEGATIVE

## 2020-03-16 NOTE — Assessment & Plan Note (Signed)
>>  ASSESSMENT AND PLAN FOR STRESS INCONTINENCE WRITTEN ON 03/16/2020  9:20 AM BY Remo Lipps, MD  Reports incontinence going on for many years.  There are episodes where she coughs her list something and has urine leakage.  There are also times that she feels a sudden urge to use the bathroom, but sometimes does not have much volume when she goes.  Other times she leaks without knowing it.  She has had 1 pregnancy which is delivered by C-section, no vaginal deliveries.  Denies any vaginal prolapse or noticing other structural abnormalities.  She voided roughly 100 cc during this visit and had a post void residual of >39 cc.  I initially suspected overflow incontinence with the description of her symptoms, but she had a normal postvoid residual.  At this time suspect mixed stress and urge incontinence.  Given voiding diary.  -Follow-up voiding diary -Discussed behavioral modification such as scheduled voids

## 2020-03-16 NOTE — Assessment & Plan Note (Signed)
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  Has not had medication for this previously.  Just had an episode last week which has resolved.  On exam, there is a small healing crusted lesion on the upper lip.  States they were vesicular and painful last week.  -Prescribing valacyclovir for use in future episodes

## 2020-03-16 NOTE — Assessment & Plan Note (Signed)
Reports incontinence going on for many years.  There are episodes where she coughs her list something and has urine leakage.  There are also times that she feels a sudden urge to use the bathroom, but sometimes does not have much volume when she goes.  Other times she leaks without knowing it.  She has had 1 pregnancy which is delivered by C-section, no vaginal deliveries.  Denies any vaginal prolapse or noticing other structural abnormalities.  She voided roughly 100 cc during this visit and had a post void residual of >39 cc.  I initially suspected overflow incontinence with the description of her symptoms, but she had a normal postvoid residual.  At this time suspect mixed stress and urge incontinence.  Given voiding diary.  -Follow-up voiding diary -Discussed behavioral modification such as scheduled voids

## 2020-03-16 NOTE — Assessment & Plan Note (Signed)
Patient is noted to be more hyperactive during this encounter than prior.  She has good follow-up with behavioral health, was last seen 1 month ago.  They are managing her medications.  She is controlled on citalopram 20mg  daily, seroquel 30mg  daily, and gabapentin 400mg  TID. Also takes Xanax 1mg  TID for anxiety.  Denies SI, HI, hallucinations.  -Continue follow-up with psych

## 2020-03-16 NOTE — Assessment & Plan Note (Signed)
Patient complains of pain in multiple joints including bilateral fingers, wrist, shoulders, hips ongoing for many years. The pain is worse in the morning along with stiffness. Had negative ESR, CRP, ANA, CCP, and RF at last visit.  Reports pain is continued.  Is not taking any medication for it besides her prescribed gabapentin.  -Check x-rays of bilateral hands, treat as osteoarthritis ending results -Counseled using OTC Tylenol and ibuprofen, and warm packs as needed -Start Voltaren gel

## 2020-03-17 NOTE — Progress Notes (Signed)
Internal Medicine Clinic Attending  Case discussed with Dr. Chen  At the time of the visit.  We reviewed the resident's history and exam and pertinent patient test results.  I agree with the assessment, diagnosis, and plan of care documented in the resident's note. 

## 2020-03-27 MED FILL — GENVOYA TABLET: 150-150-200 | 30 days supply | Qty: 30 | Fill #0

## 2020-04-10 ENCOUNTER — Telehealth: Payer: Self-pay | Admitting: Infectious Diseases

## 2020-04-10 NOTE — Telephone Encounter (Signed)
error 

## 2020-04-17 ENCOUNTER — Other Ambulatory Visit: Payer: Medicaid Other

## 2020-04-20 MED FILL — GENVOYA TABLET: 150-150-200 | 30 days supply | Qty: 30 | Fill #1

## 2020-04-25 ENCOUNTER — Other Ambulatory Visit (HOSPITAL_COMMUNITY): Payer: Self-pay

## 2020-04-27 ENCOUNTER — Encounter (HOSPITAL_COMMUNITY): Payer: Self-pay | Admitting: Psychiatry

## 2020-04-27 ENCOUNTER — Telehealth (INDEPENDENT_AMBULATORY_CARE_PROVIDER_SITE_OTHER): Payer: Medicaid Other | Admitting: Psychiatry

## 2020-04-27 ENCOUNTER — Other Ambulatory Visit (HOSPITAL_COMMUNITY): Payer: Self-pay | Admitting: Psychiatry

## 2020-04-27 ENCOUNTER — Other Ambulatory Visit: Payer: Self-pay

## 2020-04-27 DIAGNOSIS — F250 Schizoaffective disorder, bipolar type: Secondary | ICD-10-CM | POA: Diagnosis not present

## 2020-04-27 DIAGNOSIS — F419 Anxiety disorder, unspecified: Secondary | ICD-10-CM | POA: Diagnosis not present

## 2020-04-27 MED ORDER — ALPRAZOLAM 1 MG PO TABS: 1.0000 mg | ORAL_TABLET | Freq: Three times a day (TID) | ORAL | 2 refills | Status: DC | PRN

## 2020-04-27 MED ORDER — GABAPENTIN 400 MG PO CAPS: 400.0000 mg | ORAL_CAPSULE | Freq: Three times a day (TID) | ORAL | 2 refills | Status: DC

## 2020-04-27 MED ORDER — CITALOPRAM HYDROBROMIDE 20 MG PO TABS: 20.0000 mg | ORAL_TABLET | Freq: Every day | ORAL | 2 refills | Status: DC

## 2020-04-27 MED ORDER — QUETIAPINE FUMARATE 300 MG PO TABS: 300.0000 mg | ORAL_TABLET | Freq: Every day | ORAL | 2 refills | Status: DC

## 2020-04-27 NOTE — Progress Notes (Signed)
[place] MD/PA/NP OP Progress Note  Virtual Visit via Telephone Note  I connected with Mackenzie Gonzalez on 04/27/20 at 11:20 AM EDT by telephone and verified that I am speaking with the correct person using two identifiers.  Location: Patient: home Provider: Clinic   I discussed the limitations, risks, security and privacy concerns of performing an evaluation and management service by telephone and the availability of in person appointments. I also discussed with the patient that there may be a patient responsible charge related to this service. The patient expressed understanding and agreed to proceed.   I provided 15 minutes of non-face-to-face time during this encounter.     04/27/2020 11:18 AM Mackenzie Gonzalez  MRN:  [id number]  Chief Complaint: " I am doing a lot better."   HPI: Patient reported she is doing much better compared to the last few months.  She is still living with her sister in [place].  [place] kind of problem for her and makes her feel more paranoid.  She stated that she just spoke with her caseworker this morning and he is very proud of the progress she is making. She stated that she still has some paranoia but that is her baseline.  She denied having any suicidal or homicidal ideations. She denied any command type auditory hallucinations or visual hallucinations. She stated that her medications are helping her and she would like to continue the same regimen.  She has been having them delivered by the [facility] as riding [place]and[place]forth is too difficult for her. Overall she feels she is in a much better place and wants to keep things the way they are.   Visit Diagnosis:    ICD-10-CM   1. Anxiety disorder, unspecified type  F41.9 ALPRAZolam (XANAX) 1 MG tablet  2. Schizoaffective disorder, bipolar type ([facility])  F25.0 citalopram (CELEXA) 20 MG tablet    QUEtiapine (SEROQUEL) 300 MG tablet     gabapentin (NEURONTIN) 400 MG capsule    Past Psychiatric History: Schizoaffective disorder, polysubstance abuse  Past Medical History:  Past Medical History:  Diagnosis Date  . Anxiety   . Bipolar 1 disorder ([place])   . Chronic bronchitis ([place])   . Hepatitis C carrier ([place])   . HIV (human immunodeficiency virus infection) ([place]) 2015  . Schizoaffective disorder [facility])     Past Surgical History:  Procedure Laterality Date  . BREAST EXCISIONAL BIOPSY Left 1987   benign  . SP ARTHRO WRIST*R*      Family Psychiatric History: Mother- bipolar d/o, father- alcohol abuse   Family History:  Family History  Problem Relation Age of Onset  . Psychiatric Illness Mother   . Hepatitis Father   . Alcohol abuse Father   . Breast cancer Paternal Aunt     Social History:  Social History   Socioeconomic History  . Marital status: Single    Spouse name: Not on file  . Number of children: Not on file  . Years of education: Not on file  . Highest education level: Not on file  Occupational History  . Not on file  Tobacco Use  . Smoking status: Current Every Day Smoker    Packs/day: 1.00    Years: 48.00    Pack years: 48.00    Types: Cigarettes  . Smokeless tobacco: Never Used  Substance and Sexual Activity  . Alcohol use: Not Currently  . Drug use: Not Currently  Types: "Crack" cocaine, IV  . Sexual activity: Not Currently    Partners: Female, Female  Other Topics Concern  . Not on file  Social History Narrative  . Not on file   Social Determinants of Health   Financial Resource Strain: Not on file  Food Insecurity: Not on file  Transportation Needs: Not on file  Physical Activity: Not on file  Stress: Not on file  Social Connections: Not on file    Allergies:  Allergies  Allergen Reactions  . Haloperidol   . Haloperidol Lactate     Metabolic Disorder Labs: Lab Results  Component Value Date   HGBA1C 5.3 07/26/2019   MPG 105.41 07/26/2019   No results found  for: PROLACTIN Lab Results  Component Value Date   CHOL 193 07/26/2019   TRIG 87 07/26/2019   HDL 55 07/26/2019   CHOLHDL 3.5 07/26/2019   VLDL 17 07/26/2019   LDLCALC 121 (H) 07/26/2019   LDLCALC 100 (H) 04/20/2019   Lab Results  Component Value Date   TSH 1.726 07/26/2019    Therapeutic Level Labs: No results found for: LITHIUM No results found for: VALPROATE No components found for:  CBMZ  Current Medications: Current Outpatient Medications  Medication Sig Dispense Refill  . albuterol (VENTOLIN HFA) 108 (90 Base) MCG/ACT inhaler Inhale 2 puffs into the lungs every 6 (six) hours as needed for wheezing or shortness of breath. 18 g 6  . albuterol (VENTOLIN HFA) 108 (90 Base) MCG/ACT inhaler Inhale 1-2 puffs into the lungs every 6 (six) hours as needed for wheezing or shortness of breath. 18 g 0  . ALPRAZolam (XANAX) 1 MG tablet Take 1 tablet (1 mg total) by mouth 3 (three) times daily as needed for anxiety. 90 tablet 2  . calcium-vitamin D (OSCAL WITH D) 500-200 MG-UNIT tablet Take 1 tablet by mouth daily with breakfast. 90 tablet 3  . citalopram (CELEXA) 20 MG tablet Take 1 tablet (20 mg total) by mouth daily. 30 tablet 2  . diclofenac Sodium (VOLTAREN) 1 % GEL Apply 4 g topically 4 (four) times daily. 150 g 5  . docusate sodium (COLACE) 100 MG capsule Take 1 capsule (100 mg total) by mouth 2 (two) times daily. 10 capsule 0  . elvitegravir-cobicistat-emtricitabine-tenofovir (GENVOYA) 150-150-200-10 MG TABS tablet Take 1 tablet by mouth daily with breakfast. 90 tablet 3  . fluticasone (FLONASE) 50 MCG/ACT nasal spray Place 2 sprays into both nostrils daily. 16 g 2  . gabapentin (NEURONTIN) 400 MG capsule Take 1 capsule (400 mg total) by mouth 3 (three) times daily. 90 capsule 2  . Glecaprevir-Pibrentasvir (MAVYRET) 100-40 MG TABS Take 3 tablets by mouth daily with breakfast. (Patient not taking: Reported on 01/07/2020) 84 tablet 1  . ibuprofen (ADVIL) 800 MG tablet Take 800 mg by  mouth at bedtime.    Marland Kitchen PREVIDENT 5000 BOOSTER PLUS 1.1 % PSTE Take by mouth.    . QUEtiapine (SEROQUEL) 300 MG tablet Take 1 tablet (300 mg total) by mouth at bedtime. 30 tablet 2  . valACYclovir (VALTREX) 1000 MG tablet Take 1 tablet (1,000 mg total) by mouth 3 (three) times daily. Use with herpes episodes only. Call us if you need refills, [phone] 21 tablet 0  . vitamin E 45 MG (100 UNITS) capsule Take 1 capsule (100 Units total) by mouth daily. 30 capsule 5   No current facility-administered medications for this visit.     Musculoskeletal: Strength & Muscle Tone: within normal limits Gait & Station: normal Patient leans:  N/A  Psychiatric Specialty Exam: Review of Systems    There were no vitals taken for this visit.There is no height or weight on file to calculate BMI.  General Appearance: unable to assess due to phone visit  Eye Contact:  unable to assess due to phone visit  Speech:  Clear and Coherent and Normal Rate  Volume:  Normal  Mood:  Euthymic  Affect:  Congruent  Thought Process:  Goal Directed and Descriptions of Associations: Circumstantial  Orientation:  Full (Time, Place, and Person)  Thought Content: Paranoid Ideation, at baseline, denied hallucinations  Suicidal Thoughts:  No  Homicidal Thoughts:  No  Memory:  Immediate;   Good Recent;   Good  Judgement:  Fair  Insight:  Fair  Psychomotor Activity:  Normal  Concentration:  Concentration: Good and Attention Span: Good  Recall:  Good  Fund of Knowledge: Good  Language: Good  Akathisia:  Negative  Handed:  Right  AIMS (if indicated): not done  Assets:  Communication Skills Desire for Improvement Financial Resources/Insurance  ADL's:  Intact  Cognition: WNL  Sleep:  Fair   Screenings: PHQ2-9   [place] Office Visit from 01/14/2020 in [facility] Office Visit from 01/07/2020 in [facility] for Infectious Disease Office Visit from 12/15/2019 in [facility] ED from 07/30/2019 in [place]  PHQ-2 Total Score 4 5 1 2   PHQ-9 Total Score 12 18 5 11     [place] ED from 01/14/2020 in [facility] from 01/07/2020 in [facility] for Infectious Disease  C-SSRS RISK CATEGORY Low Risk Moderate Risk       Assessment and Plan: Patient appears to be fairly stable on her current regimen.  1. Schizoaffective disorder, bipolar type ([place])  - Continue citalopram (CELEXA) 20 MG tablet; Take 1 tablet (20 mg total) by mouth daily.  Dispense: 30 tablet; Refill: 1 - Continue gabapentin (NEURONTIN) 400 MG capsule; Take 1 capsule (400 mg total) by mouth 3 (three) times daily.  Dispense: 90 capsule; Refill: 1 - Continue QUEtiapine (SEROQUEL) 300 MG tablet; Take 1 tablet (300 mg total) by mouth at bedtime.  Dispense: 30 tablet; Refill: 1  2. Anxiety disorder, unspecified type  - Continue ALPRAZolam (XANAX) 1 MG tablet; Take 1 tablet (1 mg total) by mouth 3 (three) times daily as needed for anxiety.  Dispense: 90 tablet; Refill: 1  Continue same regimen. Follow-up in 10 weeks. Pt is working with coordinator from [employer] regarding housing.   [place] Crane, MD 04/27/2020, 11:18 AM

## 2020-05-04 ENCOUNTER — Other Ambulatory Visit (HOSPITAL_COMMUNITY): Payer: Self-pay

## 2020-05-16 ENCOUNTER — Other Ambulatory Visit: Payer: Self-pay | Admitting: Infectious Diseases

## 2020-05-16 ENCOUNTER — Other Ambulatory Visit: Payer: Self-pay | Admitting: Student

## 2020-05-16 ENCOUNTER — Other Ambulatory Visit (HOSPITAL_COMMUNITY): Payer: Self-pay

## 2020-05-16 DIAGNOSIS — J410 Simple chronic bronchitis: Secondary | ICD-10-CM

## 2020-05-16 MED FILL — Citalopram Hydrobromide Tab 20 MG (Base Equiv): ORAL | 30 days supply | Qty: 30 | Fill #0 | Status: AC

## 2020-05-16 MED FILL — Alprazolam Tab 1 MG: ORAL | 30 days supply | Qty: 90 | Fill #0 | Status: AC

## 2020-05-16 MED FILL — Elvitegrav-Cobic-Emtricitab-Tenofov AF Tab 150-150-200-10 MG: ORAL | 90 days supply | Qty: 90 | Fill #0 | Status: AC

## 2020-05-16 MED FILL — Elvitegrav-Cobic-Emtricitab-Tenofov AF Tab 150-150-200-10 MG: ORAL | 90 days supply | Qty: 90 | Fill #0 | Status: CN

## 2020-05-16 MED FILL — Diclofenac Sodium Gel 1% (1.16% Diethylamine Equiv): CUTANEOUS | 25 days supply | Qty: 200 | Fill #0 | Status: CN

## 2020-05-16 MED FILL — Citalopram Hydrobromide Tab 20 MG (Base Equiv): ORAL | 30 days supply | Qty: 30 | Fill #0 | Status: CN

## 2020-05-16 MED FILL — Quetiapine Fumarate Tab 300 MG: ORAL | 30 days supply | Qty: 30 | Fill #0 | Status: AC

## 2020-05-16 MED FILL — Quetiapine Fumarate Tab 300 MG: ORAL | 30 days supply | Qty: 30 | Fill #0 | Status: CN

## 2020-05-16 MED FILL — Gabapentin Cap 400 MG: ORAL | 30 days supply | Qty: 90 | Fill #0 | Status: CN

## 2020-05-16 MED FILL — Gabapentin Cap 400 MG: ORAL | 30 days supply | Qty: 90 | Fill #0 | Status: AC

## 2020-05-16 MED FILL — Diclofenac Sodium Gel 1% (1.16% Diethylamine Equiv): CUTANEOUS | 13 days supply | Qty: 200 | Fill #0 | Status: AC

## 2020-05-17 ENCOUNTER — Other Ambulatory Visit (HOSPITAL_COMMUNITY): Payer: Self-pay

## 2020-05-17 MED ORDER — VALACYCLOVIR HCL 1 G PO TABS
ORAL_TABLET | ORAL | 0 refills | Status: DC
  Filled 2020-05-17: qty 21, 7d supply, fill #0

## 2020-05-18 ENCOUNTER — Other Ambulatory Visit (HOSPITAL_COMMUNITY): Payer: Self-pay

## 2020-05-19 ENCOUNTER — Other Ambulatory Visit (HOSPITAL_COMMUNITY): Payer: Self-pay

## 2020-05-28 DIAGNOSIS — Z8719 Personal history of other diseases of the digestive system: Secondary | ICD-10-CM

## 2020-05-28 HISTORY — DX: Personal history of other diseases of the digestive system: Z87.19

## 2020-06-12 ENCOUNTER — Other Ambulatory Visit (HOSPITAL_COMMUNITY): Payer: Self-pay

## 2020-06-12 ENCOUNTER — Other Ambulatory Visit: Payer: Self-pay | Admitting: Family Medicine

## 2020-06-12 ENCOUNTER — Other Ambulatory Visit: Payer: Self-pay | Admitting: Internal Medicine

## 2020-06-12 MED ORDER — VALACYCLOVIR HCL 1 G PO TABS
ORAL_TABLET | ORAL | 0 refills | Status: DC
  Filled 2020-06-12: qty 21, 7d supply, fill #0

## 2020-06-12 MED FILL — Citalopram Hydrobromide Tab 20 MG (Base Equiv): ORAL | 30 days supply | Qty: 30 | Fill #1 | Status: AC

## 2020-06-12 MED FILL — Diclofenac Sodium Gel 1% (1.16% Diethylamine Equiv): CUTANEOUS | 13 days supply | Qty: 200 | Fill #1 | Status: AC

## 2020-06-12 MED FILL — Quetiapine Fumarate Tab 300 MG: ORAL | 30 days supply | Qty: 30 | Fill #1 | Status: AC

## 2020-06-12 MED FILL — Gabapentin Cap 400 MG: ORAL | 30 days supply | Qty: 90 | Fill #1 | Status: AC

## 2020-06-12 MED FILL — Alprazolam Tab 1 MG: ORAL | 30 days supply | Qty: 90 | Fill #1 | Status: AC

## 2020-06-14 ENCOUNTER — Other Ambulatory Visit: Payer: Self-pay | Admitting: Family Medicine

## 2020-06-14 ENCOUNTER — Other Ambulatory Visit (HOSPITAL_COMMUNITY): Payer: Self-pay

## 2020-06-15 ENCOUNTER — Other Ambulatory Visit (HOSPITAL_COMMUNITY): Payer: Self-pay

## 2020-06-16 ENCOUNTER — Emergency Department (HOSPITAL_COMMUNITY): Payer: Medicaid Other | Admitting: Certified Registered"

## 2020-06-16 ENCOUNTER — Other Ambulatory Visit: Payer: Self-pay

## 2020-06-16 ENCOUNTER — Emergency Department (HOSPITAL_COMMUNITY): Payer: Medicaid Other

## 2020-06-16 ENCOUNTER — Encounter (HOSPITAL_COMMUNITY)
Admission: EM | Disposition: A | Discharge status: Home Health | Payer: Self-pay | Source: Home / Self Care | Attending: Orthopedic Surgery

## 2020-06-16 ENCOUNTER — Inpatient Hospital Stay (HOSPITAL_COMMUNITY)
Admission: EM | Admit: 2020-06-16 | Discharge: 2020-06-21 | DRG: 500 | Disposition: A | Discharge status: Home Health | Payer: Medicaid Other | Attending: Family Medicine | Admitting: Family Medicine

## 2020-06-16 ENCOUNTER — Encounter (HOSPITAL_COMMUNITY): Payer: Self-pay

## 2020-06-16 ENCOUNTER — Other Ambulatory Visit: Payer: Self-pay | Admitting: Family Medicine

## 2020-06-16 ENCOUNTER — Other Ambulatory Visit (HOSPITAL_COMMUNITY): Payer: Self-pay

## 2020-06-16 DIAGNOSIS — S62326B Displaced fracture of shaft of fifth metacarpal bone, right hand, initial encounter for open fracture: Secondary | ICD-10-CM

## 2020-06-16 DIAGNOSIS — K2211 Ulcer of esophagus with bleeding: Secondary | ICD-10-CM | POA: Diagnosis present

## 2020-06-16 DIAGNOSIS — S62606B Fracture of unspecified phalanx of right little finger, initial encounter for open fracture: Secondary | ICD-10-CM | POA: Diagnosis present

## 2020-06-16 DIAGNOSIS — S6990XS Unspecified injury of unspecified wrist, hand and finger(s), sequela: Secondary | ICD-10-CM

## 2020-06-16 DIAGNOSIS — F17200 Nicotine dependence, unspecified, uncomplicated: Secondary | ICD-10-CM | POA: Diagnosis present

## 2020-06-16 DIAGNOSIS — S62304A Unspecified fracture of fourth metacarpal bone, right hand, initial encounter for closed fracture: Secondary | ICD-10-CM | POA: Diagnosis present

## 2020-06-16 DIAGNOSIS — S62606A Fracture of unspecified phalanx of right little finger, initial encounter for closed fracture: Secondary | ICD-10-CM | POA: Diagnosis present

## 2020-06-16 DIAGNOSIS — D500 Iron deficiency anemia secondary to blood loss (chronic): Secondary | ICD-10-CM | POA: Diagnosis present

## 2020-06-16 DIAGNOSIS — F319 Bipolar disorder, unspecified: Secondary | ICD-10-CM | POA: Diagnosis present

## 2020-06-16 DIAGNOSIS — R093 Abnormal sputum: Secondary | ICD-10-CM

## 2020-06-16 DIAGNOSIS — Y92410 Unspecified street and highway as the place of occurrence of the external cause: Secondary | ICD-10-CM

## 2020-06-16 DIAGNOSIS — S62304B Unspecified fracture of fourth metacarpal bone, right hand, initial encounter for open fracture: Secondary | ICD-10-CM

## 2020-06-16 DIAGNOSIS — Z20822 Contact with and (suspected) exposure to covid-19: Secondary | ICD-10-CM | POA: Diagnosis present

## 2020-06-16 DIAGNOSIS — S52572A Other intraarticular fracture of lower end of left radius, initial encounter for closed fracture: Secondary | ICD-10-CM | POA: Diagnosis not present

## 2020-06-16 DIAGNOSIS — R1310 Dysphagia, unspecified: Secondary | ICD-10-CM | POA: Diagnosis not present

## 2020-06-16 DIAGNOSIS — R4182 Altered mental status, unspecified: Secondary | ICD-10-CM | POA: Diagnosis not present

## 2020-06-16 DIAGNOSIS — B20 Human immunodeficiency virus [HIV] disease: Secondary | ICD-10-CM | POA: Diagnosis present

## 2020-06-16 DIAGNOSIS — K209 Esophagitis, unspecified without bleeding: Secondary | ICD-10-CM | POA: Diagnosis present

## 2020-06-16 DIAGNOSIS — D62 Acute posthemorrhagic anemia: Secondary | ICD-10-CM | POA: Diagnosis not present

## 2020-06-16 DIAGNOSIS — Z23 Encounter for immunization: Secondary | ICD-10-CM

## 2020-06-16 DIAGNOSIS — S62326A Displaced fracture of shaft of fifth metacarpal bone, right hand, initial encounter for closed fracture: Secondary | ICD-10-CM | POA: Diagnosis present

## 2020-06-16 DIAGNOSIS — S52502A Unspecified fracture of the lower end of left radius, initial encounter for closed fracture: Secondary | ICD-10-CM

## 2020-06-16 DIAGNOSIS — K208 Other esophagitis without bleeding: Secondary | ICD-10-CM | POA: Diagnosis present

## 2020-06-16 DIAGNOSIS — F17210 Nicotine dependence, cigarettes, uncomplicated: Secondary | ICD-10-CM | POA: Diagnosis present

## 2020-06-16 DIAGNOSIS — F99 Mental disorder, not otherwise specified: Secondary | ICD-10-CM

## 2020-06-16 DIAGNOSIS — K920 Hematemesis: Secondary | ICD-10-CM | POA: Diagnosis present

## 2020-06-16 DIAGNOSIS — F259 Schizoaffective disorder, unspecified: Secondary | ICD-10-CM | POA: Diagnosis present

## 2020-06-16 DIAGNOSIS — Z21 Asymptomatic human immunodeficiency virus [HIV] infection status: Secondary | ICD-10-CM | POA: Diagnosis present

## 2020-06-16 DIAGNOSIS — F410 Panic disorder [episodic paroxysmal anxiety] without agoraphobia: Secondary | ICD-10-CM

## 2020-06-16 DIAGNOSIS — F419 Anxiety disorder, unspecified: Secondary | ICD-10-CM | POA: Diagnosis present

## 2020-06-16 DIAGNOSIS — Z885 Allergy status to narcotic agent status: Secondary | ICD-10-CM

## 2020-06-16 HISTORY — DX: Mental disorder, not otherwise specified: F99

## 2020-06-16 HISTORY — PX: I & D EXTREMITY: SHX5045

## 2020-06-16 HISTORY — DX: Human immunodeficiency virus (HIV) disease: B20

## 2020-06-16 HISTORY — PX: ORIF WRIST FRACTURE: SHX2133

## 2020-06-16 LAB — ETHANOL: Alcohol, Ethyl (B): 60 mg/dL — ABNORMAL HIGH (ref <10)

## 2020-06-16 LAB — CBC
HCT: 39.6 % (ref 36.0–46.0)
Hemoglobin: 13.2 g/dL (ref 12.0–15.0)
MCH: 33.5 pg (ref 26.0–34.0)
MCHC: 33.3 g/dL (ref 30.0–36.0)
MCV: 100.5 fL — ABNORMAL HIGH (ref 80.0–100.0)
Platelets: 199 10*3/uL (ref 150–400)
RBC: 3.94 MIL/uL (ref 3.87–5.11)
RDW: 13 % (ref 11.5–15.5)
WBC: 8.4 10*3/uL (ref 4.0–10.5)
nRBC: 0 % (ref 0.0–0.2)

## 2020-06-16 LAB — COMPREHENSIVE METABOLIC PANEL
ALT: 14 U/L (ref 0–44)
AST: 17 U/L (ref 15–41)
Albumin: 3.8 g/dL (ref 3.5–5.0)
Alkaline Phosphatase: 62 U/L (ref 38–126)
Anion gap: 12 (ref 5–15)
BUN: 19 mg/dL (ref 6–20)
CO2: 20 mmol/L — ABNORMAL LOW (ref 22–32)
Calcium: 9.2 mg/dL (ref 8.9–10.3)
Chloride: 107 mmol/L (ref 98–111)
Creatinine, Ser: 1.35 mg/dL — ABNORMAL HIGH (ref 0.44–1.00)
GFR, Estimated: 46 mL/min — ABNORMAL LOW (ref >60)
Glucose, Bld: 104 mg/dL — ABNORMAL HIGH (ref 70–99)
Potassium: 3.8 mmol/L (ref 3.5–5.1)
Sodium: 139 mmol/L (ref 135–145)
Total Bilirubin: 0.7 mg/dL (ref 0.3–1.2)
Total Protein: 6.9 g/dL (ref 6.5–8.1)

## 2020-06-16 LAB — RESP PANEL BY RT-PCR (FLU A&B, COVID) ARPGX2
Influenza A by PCR: NEGATIVE
Influenza B by PCR: NEGATIVE
SARS Coronavirus 2 by RT PCR: NEGATIVE

## 2020-06-16 LAB — LACTIC ACID, PLASMA: Lactic Acid, Venous: 1.8 mmol/L (ref 0.5–1.9)

## 2020-06-16 LAB — I-STAT CHEM 8, ED
BUN: 19 mg/dL (ref 6–20)
Calcium, Ion: 1.1 mmol/L — ABNORMAL LOW (ref 1.15–1.40)
Chloride: 108 mmol/L (ref 98–111)
Creatinine, Ser: 1.3 mg/dL — ABNORMAL HIGH (ref 0.44–1.00)
Glucose, Bld: 103 mg/dL — ABNORMAL HIGH (ref 70–99)
HCT: 38 % (ref 36.0–46.0)
Hemoglobin: 12.9 g/dL (ref 12.0–15.0)
Potassium: 3.7 mmol/L (ref 3.5–5.1)
Sodium: 141 mmol/L (ref 135–145)
TCO2: 20 mmol/L — ABNORMAL LOW (ref 22–32)

## 2020-06-16 LAB — SAMPLE TO BLOOD BANK

## 2020-06-16 LAB — PROTIME-INR
INR: 1.1 (ref 0.8–1.2)
Prothrombin Time: 14.1 seconds (ref 11.4–15.2)

## 2020-06-16 SURGERY — IRRIGATION AND DEBRIDEMENT EXTREMITY
Anesthesia: General | Site: Wrist | Laterality: Right

## 2020-06-16 MED ORDER — KCL IN DEXTROSE-NACL 20-5-0.45 MEQ/L-%-% IV SOLN
INTRAVENOUS | Status: DC
  Filled 2020-06-16 (×5): qty 1000

## 2020-06-16 MED ORDER — METHOCARBAMOL 500 MG PO TABS: 500.0000 mg | ORAL_TABLET | Freq: Four times a day (QID) | ORAL | Status: DC | PRN

## 2020-06-16 MED ORDER — DEXMEDETOMIDINE (PRECEDEX) IN NS 20 MCG/5ML (4 MCG/ML) IV SYRINGE
PREFILLED_SYRINGE | INTRAVENOUS | Status: DC | PRN
  Administered 2020-06-16: 12 ug via INTRAVENOUS
  Administered 2020-06-16: 8 ug via INTRAVENOUS

## 2020-06-16 MED ORDER — OXYCODONE HCL 5 MG PO TABS
10.0000 mg | ORAL_TABLET | ORAL | Status: DC | PRN
  Administered 2020-06-16 – 2020-06-19 (×3): 15 mg via ORAL
  Filled 2020-06-16 (×3): qty 3

## 2020-06-16 MED ORDER — CHLORHEXIDINE GLUCONATE 4 % EX LIQD
60.0000 mL | Freq: Once | CUTANEOUS | Status: DC
  Filled 2020-06-16: qty 60

## 2020-06-16 MED ORDER — ACETAMINOPHEN 325 MG PO TABS: 325.0000 mg | ORAL_TABLET | Freq: Four times a day (QID) | ORAL | Status: DC | PRN

## 2020-06-16 MED ORDER — CHLORHEXIDINE GLUCONATE 0.12 % MT SOLN
15.0000 mL | OROMUCOSAL | Status: AC
  Filled 2020-06-16 (×2): qty 15

## 2020-06-16 MED ORDER — SUCCINYLCHOLINE CHLORIDE 200 MG/10ML IV SOSY
PREFILLED_SYRINGE | INTRAVENOUS | Status: AC
  Filled 2020-06-16: qty 10

## 2020-06-16 MED ORDER — SUCCINYLCHOLINE CHLORIDE 200 MG/10ML IV SOSY
PREFILLED_SYRINGE | INTRAVENOUS | Status: DC | PRN
  Administered 2020-06-16: 100 mg via INTRAVENOUS

## 2020-06-16 MED ORDER — FENTANYL CITRATE (PF) 250 MCG/5ML IJ SOLN
INTRAMUSCULAR | Status: AC
  Filled 2020-06-16: qty 5

## 2020-06-16 MED ORDER — HYDROMORPHONE HCL 1 MG/ML IJ SOLN
0.5000 mg | INTRAMUSCULAR | Status: DC | PRN
  Administered 2020-06-17: 0.5 mg via INTRAVENOUS
  Administered 2020-06-17 – 2020-06-18 (×5): 1 mg via INTRAVENOUS
  Filled 2020-06-16 (×7): qty 1

## 2020-06-16 MED ORDER — ONDANSETRON HCL 4 MG PO TABS: 4.0000 mg | ORAL_TABLET | Freq: Four times a day (QID) | ORAL | Status: DC | PRN

## 2020-06-16 MED ORDER — STERILE WATER FOR IRRIGATION IR SOLN
Status: DC | PRN
  Administered 2020-06-16: 1000 mL

## 2020-06-16 MED ORDER — LIDOCAINE 2% (20 MG/ML) 5 ML SYRINGE
INTRAMUSCULAR | Status: DC | PRN
  Administered 2020-06-16: 60 mg via INTRAVENOUS

## 2020-06-16 MED ORDER — LIDOCAINE 2% (20 MG/ML) 5 ML SYRINGE
INTRAMUSCULAR | Status: AC
  Filled 2020-06-16: qty 5

## 2020-06-16 MED ORDER — HYDROMORPHONE HCL 1 MG/ML IJ SOLN
INTRAMUSCULAR | Status: AC
  Administered 2020-06-17: 1 mg via INTRAVENOUS
  Filled 2020-06-16: qty 1

## 2020-06-16 MED ORDER — PROPOFOL 10 MG/ML IV BOLUS
INTRAVENOUS | Status: AC
  Filled 2020-06-16: qty 20

## 2020-06-16 MED ORDER — DEXMEDETOMIDINE (PRECEDEX) IN NS 20 MCG/5ML (4 MCG/ML) IV SYRINGE
PREFILLED_SYRINGE | INTRAVENOUS | Status: AC
  Filled 2020-06-16: qty 5

## 2020-06-16 MED ORDER — PHENYLEPHRINE 40 MCG/ML (10ML) SYRINGE FOR IV PUSH (FOR BLOOD PRESSURE SUPPORT)
PREFILLED_SYRINGE | INTRAVENOUS | Status: DC | PRN
  Administered 2020-06-16 (×2): 120 ug via INTRAVENOUS
  Administered 2020-06-16: 40 ug via INTRAVENOUS
  Administered 2020-06-16: 120 ug via INTRAVENOUS

## 2020-06-16 MED ORDER — DEXAMETHASONE SODIUM PHOSPHATE 10 MG/ML IJ SOLN
INTRAMUSCULAR | Status: AC
  Filled 2020-06-16: qty 1

## 2020-06-16 MED ORDER — ADULT MULTIVITAMIN W/MINERALS CH
1.0000 | ORAL_TABLET | Freq: Every day | ORAL | Status: DC
  Administered 2020-06-17 – 2020-06-21 (×5): 1 via ORAL
  Filled 2020-06-16 (×5): qty 1

## 2020-06-16 MED ORDER — ONDANSETRON HCL 4 MG/2ML IJ SOLN
4.0000 mg | Freq: Four times a day (QID) | INTRAMUSCULAR | Status: DC | PRN
  Administered 2020-06-17 – 2020-06-18 (×2): 4 mg via INTRAVENOUS
  Filled 2020-06-16 (×2): qty 2

## 2020-06-16 MED ORDER — METHOCARBAMOL 1000 MG/10ML IJ SOLN
500.0000 mg | Freq: Four times a day (QID) | INTRAVENOUS | Status: DC | PRN
  Filled 2020-06-16 (×2): qty 5

## 2020-06-16 MED ORDER — LACTATED RINGERS IV SOLN: INTRAVENOUS | Status: DC

## 2020-06-16 MED ORDER — TETANUS-DIPHTH-ACELL PERTUSSIS 5-2.5-18.5 LF-MCG/0.5 IM SUSY
0.5000 mL | PREFILLED_SYRINGE | Freq: Once | INTRAMUSCULAR | Status: AC
  Administered 2020-06-16: 0.5 mL via INTRAMUSCULAR
  Filled 2020-06-16: qty 0.5

## 2020-06-16 MED ORDER — ROCURONIUM BROMIDE 10 MG/ML (PF) SYRINGE
PREFILLED_SYRINGE | INTRAVENOUS | Status: DC | PRN
  Administered 2020-06-16: 40 mg via INTRAVENOUS

## 2020-06-16 MED ORDER — ASCORBIC ACID 500 MG PO TABS
1000.0000 mg | ORAL_TABLET | Freq: Every day | ORAL | Status: DC
  Administered 2020-06-17 – 2020-06-21 (×5): 1000 mg via ORAL
  Filled 2020-06-16 (×5): qty 2

## 2020-06-16 MED ORDER — IOHEXOL 350 MG/ML SOLN
75.0000 mL | Freq: Once | INTRAVENOUS | Status: AC | PRN
  Administered 2020-06-16: 75 mL via INTRAVENOUS

## 2020-06-16 MED ORDER — FENTANYL CITRATE (PF) 100 MCG/2ML IJ SOLN
50.0000 ug | Freq: Once | INTRAMUSCULAR | Status: AC
  Administered 2020-06-16: 50 ug via INTRAVENOUS
  Filled 2020-06-16: qty 2

## 2020-06-16 MED ORDER — ALBUMIN HUMAN 5 % IV SOLN: INTRAVENOUS | Status: DC | PRN

## 2020-06-16 MED ORDER — 0.9 % SODIUM CHLORIDE (POUR BTL) OPTIME
TOPICAL | Status: DC | PRN
  Administered 2020-06-16: 1000 mL

## 2020-06-16 MED ORDER — MORPHINE SULFATE (PF) 4 MG/ML IV SOLN
4.0000 mg | Freq: Once | INTRAVENOUS | Status: AC
  Administered 2020-06-16: 4 mg via INTRAVENOUS
  Filled 2020-06-16: qty 1

## 2020-06-16 MED ORDER — ROCURONIUM BROMIDE 10 MG/ML (PF) SYRINGE
PREFILLED_SYRINGE | INTRAVENOUS | Status: AC
  Filled 2020-06-16: qty 10

## 2020-06-16 MED ORDER — ONDANSETRON HCL 4 MG/2ML IJ SOLN
INTRAMUSCULAR | Status: AC
  Filled 2020-06-16: qty 2

## 2020-06-16 MED ORDER — HYDROMORPHONE HCL 1 MG/ML IJ SOLN
0.2500 mg | INTRAMUSCULAR | Status: DC | PRN
  Administered 2020-06-16 (×2): 0.5 mg via INTRAVENOUS

## 2020-06-16 MED ORDER — SUGAMMADEX SODIUM 200 MG/2ML IV SOLN
INTRAVENOUS | Status: DC | PRN
  Administered 2020-06-16: 200 mg via INTRAVENOUS

## 2020-06-16 MED ORDER — MIDAZOLAM HCL 2 MG/2ML IJ SOLN
INTRAMUSCULAR | Status: AC
  Filled 2020-06-16: qty 2

## 2020-06-16 MED ORDER — SODIUM CHLORIDE 0.9 % IR SOLN
Status: DC | PRN
  Administered 2020-06-16: 1000 mL

## 2020-06-16 MED ORDER — POVIDONE-IODINE 10 % EX SWAB: 2.0000 "application " | Freq: Once | CUTANEOUS | Status: DC

## 2020-06-16 MED ORDER — OXYCODONE HCL ER 10 MG PO T12A
10.0000 mg | EXTENDED_RELEASE_TABLET | Freq: Two times a day (BID) | ORAL | Status: DC
  Administered 2020-06-17 – 2020-06-20 (×7): 10 mg via ORAL
  Filled 2020-06-16 (×7): qty 1

## 2020-06-16 MED ORDER — CEFAZOLIN SODIUM-DEXTROSE 2-4 GM/100ML-% IV SOLN
2.0000 g | Freq: Three times a day (TID) | INTRAVENOUS | Status: AC
  Administered 2020-06-17 (×3): 2 g via INTRAVENOUS
  Filled 2020-06-16 (×3): qty 100

## 2020-06-16 MED ORDER — PROPOFOL 10 MG/ML IV BOLUS
INTRAVENOUS | Status: DC | PRN
  Administered 2020-06-16: 120 mg via INTRAVENOUS

## 2020-06-16 MED ORDER — ONDANSETRON HCL 4 MG/2ML IJ SOLN
INTRAMUSCULAR | Status: DC | PRN
  Administered 2020-06-16: 4 mg via INTRAVENOUS

## 2020-06-16 MED ORDER — FENTANYL CITRATE (PF) 250 MCG/5ML IJ SOLN
INTRAMUSCULAR | Status: DC | PRN
  Administered 2020-06-16: 50 ug via INTRAVENOUS
  Administered 2020-06-16 (×2): 100 ug via INTRAVENOUS

## 2020-06-16 MED ORDER — OXYCODONE HCL 5 MG PO TABS
5.0000 mg | ORAL_TABLET | ORAL | Status: DC | PRN
  Administered 2020-06-18: 10 mg via ORAL
  Filled 2020-06-16: qty 2

## 2020-06-16 MED ORDER — DEXAMETHASONE SODIUM PHOSPHATE 10 MG/ML IJ SOLN
INTRAMUSCULAR | Status: DC | PRN
  Administered 2020-06-16: 10 mg via INTRAVENOUS

## 2020-06-16 MED ORDER — MIDAZOLAM HCL 2 MG/2ML IJ SOLN
INTRAMUSCULAR | Status: DC | PRN
  Administered 2020-06-16: 2 mg via INTRAVENOUS

## 2020-06-16 MED ORDER — CEFAZOLIN SODIUM-DEXTROSE 2-4 GM/100ML-% IV SOLN
2.0000 g | INTRAVENOUS | Status: AC
  Administered 2020-06-16: 2 g via INTRAVENOUS
  Filled 2020-06-16 (×2): qty 100

## 2020-06-16 MED ORDER — SODIUM CHLORIDE 0.9 % IV SOLN: Freq: Once | INTRAVENOUS | Status: AC

## 2020-06-16 MED ORDER — DIPHENHYDRAMINE HCL 25 MG PO CAPS: 25.0000 mg | ORAL_CAPSULE | Freq: Four times a day (QID) | ORAL | Status: DC | PRN

## 2020-06-16 SURGICAL SUPPLY — 105 items
BIT DRILL 1.1X60MM (BIT) ×2 IMPLANT
BIT DRILL 2.2 SS TIBIAL (BIT) ×3 IMPLANT
BLADE CLIPPER SURG (BLADE) IMPLANT
BLADE SURG 15 STRL LF DISP TIS (BLADE) ×2 IMPLANT
BLADE SURG 15 STRL SS (BLADE) ×3
BNDG COHESIVE 1X5 TAN STRL LF (GAUZE/BANDAGES/DRESSINGS) IMPLANT
BNDG CONFORM 2 STRL LF (GAUZE/BANDAGES/DRESSINGS) IMPLANT
BNDG ELASTIC 3X5.8 VLCR STR LF (GAUZE/BANDAGES/DRESSINGS) ×3 IMPLANT
BNDG ELASTIC 4X5.8 VLCR STR LF (GAUZE/BANDAGES/DRESSINGS) ×6 IMPLANT
BNDG ELASTIC 6X5.8 VLCR STR LF (GAUZE/BANDAGES/DRESSINGS) ×6 IMPLANT
BNDG ESMARK 4X9 LF (GAUZE/BANDAGES/DRESSINGS) ×3 IMPLANT
BNDG GAUZE ELAST 4 BULKY (GAUZE/BANDAGES/DRESSINGS) ×6 IMPLANT
CORD BIPOLAR FORCEPS 12FT (ELECTRODE) ×6 IMPLANT
COVER SURGICAL LIGHT HANDLE (MISCELLANEOUS) ×3 IMPLANT
COVER WAND RF STERILE (DRAPES) IMPLANT
CUFF TOURN SGL QUICK 18X4 (TOURNIQUET CUFF) ×3 IMPLANT
CUFF TOURN SGL QUICK 24 (TOURNIQUET CUFF)
CUFF TRNQT CYL 24X4X16.5-23 (TOURNIQUET CUFF) IMPLANT
DRAIN PENROSE 1/4X12 LTX STRL (WOUND CARE) IMPLANT
DRAIN TLS ROUND 10FR (DRAIN) IMPLANT
DRAPE OEC MINIVIEW 54X84 (DRAPES) ×6 IMPLANT
DRAPE SURG 17X11 SM STRL (DRAPES) ×6 IMPLANT
DRAPE SURG 17X23 STRL (DRAPES) ×3 IMPLANT
DRILL 1.1X60MM (BIT) ×3
DRIVER BIT 1.5 (TRAUMA) ×6 IMPLANT
DRSG ADAPTIC 3X8 NADH LF (GAUZE/BANDAGES/DRESSINGS) ×3 IMPLANT
ELECT REM PT RETURN 9FT ADLT (ELECTROSURGICAL)
ELECTRODE REM PT RTRN 9FT ADLT (ELECTROSURGICAL) IMPLANT
GAUZE SPONGE 4X4 12PLY STRL (GAUZE/BANDAGES/DRESSINGS) IMPLANT
GAUZE SPONGE 4X4 12PLY STRL LF (GAUZE/BANDAGES/DRESSINGS) ×6 IMPLANT
GAUZE XEROFORM 1X8 LF (GAUZE/BANDAGES/DRESSINGS) IMPLANT
GAUZE XEROFORM 5X9 LF (GAUZE/BANDAGES/DRESSINGS) ×3 IMPLANT
GLOVE BIOGEL PI IND STRL 8.5 (GLOVE) ×4 IMPLANT
GLOVE BIOGEL PI INDICATOR 8.5 (GLOVE) ×2
GLOVE SURG ORTHO 8.0 STRL STRW (GLOVE) ×6 IMPLANT
GLOVE SURG UNDER POLY LF SZ6.5 (GLOVE) ×6 IMPLANT
GOWN STRL REIN XL XLG (GOWN DISPOSABLE) ×3 IMPLANT
GOWN STRL REUS W/ TWL LRG LVL3 (GOWN DISPOSABLE) ×6 IMPLANT
GOWN STRL REUS W/ TWL XL LVL3 (GOWN DISPOSABLE) ×2 IMPLANT
GOWN STRL REUS W/TWL LRG LVL3 (GOWN DISPOSABLE) ×9
GOWN STRL REUS W/TWL XL LVL3 (GOWN DISPOSABLE) ×3
HANDPIECE INTERPULSE COAX TIP (DISPOSABLE)
K-WIRE 1.6 (WIRE) ×3
K-WIRE DBL TRONS .035X6 (WIRE) ×3
K-WIRE FX5X1.6XNS BN SS (WIRE) ×2
KIT BASIN OR (CUSTOM PROCEDURE TRAY) ×3 IMPLANT
KIT TURNOVER KIT B (KITS) ×3 IMPLANT
KWIRE DBL TRONS .035X6 (WIRE) ×2 IMPLANT
KWIRE FX5X1.6XNS BN SS (WIRE) ×2 IMPLANT
MANIFOLD NEPTUNE II (INSTRUMENTS) ×3 IMPLANT
NEEDLE HYPO 25GX1X1/2 BEV (NEEDLE) IMPLANT
NEEDLE HYPO 25X1 1.5 SAFETY (NEEDLE) IMPLANT
NS IRRIG 1000ML POUR BTL (IV SOLUTION) ×3 IMPLANT
PACK ORTHO EXTREMITY (CUSTOM PROCEDURE TRAY) ×3 IMPLANT
PAD ARMBOARD 7.5X6 YLW CONV (MISCELLANEOUS) ×6 IMPLANT
PAD CAST 4YDX4 CTTN HI CHSV (CAST SUPPLIES) ×2 IMPLANT
PADDING CAST ABS 4INX4YD NS (CAST SUPPLIES) ×1
PADDING CAST ABS 6INX4YD NS (CAST SUPPLIES) ×2
PADDING CAST ABS COTTON 4X4 ST (CAST SUPPLIES) ×2 IMPLANT
PADDING CAST ABS COTTON 6X4 NS (CAST SUPPLIES) ×4 IMPLANT
PADDING CAST COTTON 4X4 STRL (CAST SUPPLIES) ×3
PEG LOCKING SMOOTH 2.2X18 (Peg) ×9 IMPLANT
PEG LOCKING SMOOTH 2.2X20 (Screw) ×3 IMPLANT
PEG LOCKING SMOOTH 2.2X22 (Screw) ×3 IMPLANT
PEG LOCKING SMOOTH 2.2X24 (Peg) ×6 IMPLANT
PLATE STD DVR LEFT (Plate) ×3 IMPLANT
PLATE STD DVR LT 24X55 (Plate) ×2 IMPLANT
PLATE T CONT 1.5MM LOCKING (Plate) ×3 IMPLANT
SCREW L 1.5X14 (Screw) ×9 IMPLANT
SCREW LOCK 14X2.7X 3 LD TPR (Screw) ×4 IMPLANT
SCREW LOCK 16X2.7X 3 LD TPR (Screw) ×4 IMPLANT
SCREW LOCKING 1.5X13MM (Screw) ×3 IMPLANT
SCREW LOCKING 1.5X16 (Screw) ×6 IMPLANT
SCREW LOCKING 2.7X14 (Screw) ×6 IMPLANT
SCREW LOCKING 2.7X15MM (Screw) ×3 IMPLANT
SCREW LOCKING 2.7X16 (Screw) ×6 IMPLANT
SET CYSTO W/LG BORE CLAMP LF (SET/KITS/TRAYS/PACK) ×3 IMPLANT
SET HNDPC FAN SPRY TIP SCT (DISPOSABLE) IMPLANT
SOAP 2 % CHG 4 OZ (WOUND CARE) IMPLANT
SOL PREP POV-IOD 4OZ 10% (MISCELLANEOUS) ×6 IMPLANT
SOL PREP PROV IODINE SCRUB 4OZ (MISCELLANEOUS) ×6 IMPLANT
SPONGE LAP 18X18 RF (DISPOSABLE) ×3 IMPLANT
SPONGE LAP 4X18 RFD (DISPOSABLE) IMPLANT
SUT ETHILON 4 0 PS 2 18 (SUTURE) IMPLANT
SUT ETHILON 5 0 P 3 18 (SUTURE)
SUT FIBERWIRE 2-0 18 17.9 3/8 (SUTURE) ×6
SUT MNCRL AB 3-0 PS2 27 (SUTURE) ×3 IMPLANT
SUT NYLON ETHILON 5-0 P-3 1X18 (SUTURE) IMPLANT
SUT PROLENE 3 0 PS 1 (SUTURE) ×15 IMPLANT
SUT PROLENE 4 0 PS 2 18 (SUTURE) IMPLANT
SUT VIC AB 2-0 FS1 27 (SUTURE) IMPLANT
SUT VIC AB 2-0 SH 27 (SUTURE) ×3
SUT VIC AB 2-0 SH 27X BRD (SUTURE) ×2 IMPLANT
SUT VICRYL 4-0 PS2 18IN ABS (SUTURE) IMPLANT
SUTURE FIBERWR 2-0 18 17.9 3/8 (SUTURE) ×4 IMPLANT
SWAB COLLECTION DEVICE MRSA (MISCELLANEOUS) IMPLANT
SWAB CULTURE ESWAB REG 1ML (MISCELLANEOUS) IMPLANT
SYR CONTROL 10ML LL (SYRINGE) IMPLANT
SYSTEM CHEST DRAIN TLS 7FR (DRAIN) IMPLANT
TOWEL GREEN STERILE (TOWEL DISPOSABLE) ×3 IMPLANT
TOWEL GREEN STERILE FF (TOWEL DISPOSABLE) ×3 IMPLANT
TUBE CONNECTING 12X1/4 (SUCTIONS) ×3 IMPLANT
UNDERPAD 30X36 HEAVY ABSORB (UNDERPADS AND DIAPERS) ×6 IMPLANT
WATER STERILE IRR 1000ML POUR (IV SOLUTION) ×3 IMPLANT
YANKAUER SUCT BULB TIP NO VENT (SUCTIONS) ×3 IMPLANT
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
## 2020-06-16 NOTE — Anesthesia Procedure Notes (Signed)
Procedure Name: Intubation Date/Time: 06/16/2020 6:39 PM Performed by: Alain Marion, CRNA Pre-anesthesia Checklist: Patient identified, Emergency Drugs available, Suction available and Patient being monitored Patient Re-evaluated:Patient Re-evaluated prior to induction Oxygen Delivery Method: Circle System Utilized Preoxygenation: Pre-oxygenation with 100% oxygen Induction Type: IV induction and Rapid sequence Laryngoscope Size: Miller and 2 Grade View: Grade I Tube type: Oral Tube size: 7.0 mm Number of attempts: 1 Airway Equipment and Method: Stylet Placement Confirmation: ETT inserted through vocal cords under direct vision,  positive ETCO2 and breath sounds checked- equal and bilateral Secured at: 21 cm Tube secured with: Tape Dental Injury: Teeth and Oropharynx as per pre-operative assessment
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
## 2020-06-16 NOTE — ED Notes (Signed)
pts son updated and is coming to the hospital.

## 2020-06-16 NOTE — ED Notes (Signed)
Micro lab called for covid swab results, swab in process.

## 2020-06-16 NOTE — ED Notes (Signed)
X RAY at bedside 

## 2020-06-16 NOTE — ED Notes (Signed)
Pt to ct on ccm
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
## 2020-06-16 NOTE — Transfer of Care (Signed)
Immediate Anesthesia Transfer of Care Note  Patient: Mackenzie Gonzalez  Procedure(s) Performed: IRRIGATION AND DEBRIDEMENT RIGHT HAND ORIF OF HAND (Right ) OPEN REDUCTION INTERNAL FIXATION (ORIF) WRIST FRACTURE (Left Wrist)  Patient Location: PACU  Anesthesia Type:General  Level of Consciousness: awake, alert  and oriented  Airway & Oxygen Therapy: Patient Spontanous Breathing and Patient connected to face mask oxygen  Post-op Assessment: Report given to RN and Post -op Vital signs reviewed and stable  Post vital signs: Reviewed and stable  Last Vitals:  Vitals Value Taken Time  BP 165/86 06/16/20 2113  Temp    Pulse 80 06/16/20 2115  Resp 17 06/16/20 2115  SpO2 100 % 06/16/20 2115  Vitals shown include unvalidated device data.  Last Pain:  Vitals:   06/16/20 1818  TempSrc: Temporal  PainSc:          Complications: No complications documented.

## 2020-06-16 NOTE — ED Notes (Signed)
Dr [name] at bedside updating pt.

## 2020-06-16 NOTE — Anesthesia Preprocedure Evaluation (Addendum)
Anesthesia Evaluation  Patient identified by MRN, date of birth, ID band Patient awake    Reviewed: Allergy & Precautions, H&P , NPO status , Patient's Chart, lab work & pertinent test results  Airway Mallampati: II  TM Distance: >3 FB Neck ROM: Full    Dental no notable dental hx. (+) Teeth Intact, Dental Advisory Given   Pulmonary Current Smoker,    Pulmonary exam normal breath sounds clear to auscultation       Cardiovascular negative cardio ROS   Rhythm:Regular Rate:Normal     Neuro/Psych negative neurological ROS  negative psych ROS   GI/Hepatic negative GI ROS, Neg liver ROS,   Endo/Other  negative endocrine ROS  Renal/GU negative Renal ROS  negative genitourinary   Musculoskeletal   Abdominal   Peds  Hematology  (+) HIV,   Anesthesia Other Findings   Reproductive/Obstetrics negative OB ROS                            Anesthesia Physical Anesthesia Plan  ASA: II and emergent  Anesthesia Plan: General   Post-op Pain Management:    Induction: Intravenous, Rapid sequence and Cricoid pressure planned  PONV Risk Score and Plan: 3 and Ondansetron, Dexamethasone and Midazolam  Airway Management Planned: Oral ETT  Additional Equipment:   Intra-op Plan:   Post-operative Plan: Extubation in OR  Informed Consent: I have reviewed the patients History and Physical, chart, labs and discussed the procedure including the risks, benefits and alternatives for the proposed anesthesia with the patient or authorized representative who has indicated his/her understanding and acceptance.     Dental advisory given  Plan Discussed with: CRNA  Anesthesia Plan Comments:         Anesthesia Quick Evaluation

## 2020-06-16 NOTE — ED Notes (Signed)
Attempt to update family x1

## 2020-06-17 DIAGNOSIS — Y92410 Unspecified street and highway as the place of occurrence of the external cause: Secondary | ICD-10-CM | POA: Diagnosis not present

## 2020-06-17 DIAGNOSIS — Z885 Allergy status to narcotic agent status: Secondary | ICD-10-CM | POA: Diagnosis not present

## 2020-06-17 DIAGNOSIS — K2211 Ulcer of esophagus with bleeding: Secondary | ICD-10-CM | POA: Diagnosis present

## 2020-06-17 DIAGNOSIS — S52572A Other intraarticular fracture of lower end of left radius, initial encounter for closed fracture: Secondary | ICD-10-CM | POA: Diagnosis present

## 2020-06-17 DIAGNOSIS — Z20822 Contact with and (suspected) exposure to covid-19: Secondary | ICD-10-CM | POA: Diagnosis present

## 2020-06-17 DIAGNOSIS — Z23 Encounter for immunization: Secondary | ICD-10-CM | POA: Diagnosis not present

## 2020-06-17 DIAGNOSIS — D62 Acute posthemorrhagic anemia: Secondary | ICD-10-CM | POA: Diagnosis not present

## 2020-06-17 DIAGNOSIS — S62326A Displaced fracture of shaft of fifth metacarpal bone, right hand, initial encounter for closed fracture: Secondary | ICD-10-CM | POA: Diagnosis present

## 2020-06-17 DIAGNOSIS — F419 Anxiety disorder, unspecified: Secondary | ICD-10-CM | POA: Diagnosis present

## 2020-06-17 DIAGNOSIS — F319 Bipolar disorder, unspecified: Secondary | ICD-10-CM | POA: Diagnosis present

## 2020-06-17 DIAGNOSIS — R1310 Dysphagia, unspecified: Secondary | ICD-10-CM | POA: Diagnosis not present

## 2020-06-17 DIAGNOSIS — F259 Schizoaffective disorder, unspecified: Secondary | ICD-10-CM | POA: Diagnosis present

## 2020-06-17 DIAGNOSIS — Z21 Asymptomatic human immunodeficiency virus [HIV] infection status: Secondary | ICD-10-CM | POA: Diagnosis present

## 2020-06-17 DIAGNOSIS — R4182 Altered mental status, unspecified: Secondary | ICD-10-CM | POA: Diagnosis not present

## 2020-06-17 DIAGNOSIS — S62606B Fracture of unspecified phalanx of right little finger, initial encounter for open fracture: Secondary | ICD-10-CM | POA: Diagnosis present

## 2020-06-17 DIAGNOSIS — K920 Hematemesis: Secondary | ICD-10-CM | POA: Diagnosis not present

## 2020-06-17 DIAGNOSIS — D500 Iron deficiency anemia secondary to blood loss (chronic): Secondary | ICD-10-CM | POA: Diagnosis not present

## 2020-06-17 DIAGNOSIS — S6990XS Unspecified injury of unspecified wrist, hand and finger(s), sequela: Secondary | ICD-10-CM | POA: Diagnosis not present

## 2020-06-17 DIAGNOSIS — S62606A Fracture of unspecified phalanx of right little finger, initial encounter for closed fracture: Secondary | ICD-10-CM | POA: Diagnosis present

## 2020-06-17 DIAGNOSIS — K2091 Esophagitis, unspecified with bleeding: Secondary | ICD-10-CM | POA: Diagnosis not present

## 2020-06-17 DIAGNOSIS — S62304A Unspecified fracture of fourth metacarpal bone, right hand, initial encounter for closed fracture: Secondary | ICD-10-CM | POA: Diagnosis present

## 2020-06-17 DIAGNOSIS — F17210 Nicotine dependence, cigarettes, uncomplicated: Secondary | ICD-10-CM | POA: Diagnosis present

## 2020-06-17 LAB — HEMOGLOBIN AND HEMATOCRIT, BLOOD
HCT: 34.3 % — ABNORMAL LOW (ref 36.0–46.0)
Hemoglobin: 11.8 g/dL — ABNORMAL LOW (ref 12.0–15.0)

## 2020-06-17 MED ORDER — ALUM & MAG HYDROXIDE-SIMETH 200-200-20 MG/5ML PO SUSP
30.0000 mL | Freq: Four times a day (QID) | ORAL | Status: DC | PRN
  Administered 2020-06-17 – 2020-06-18 (×2): 30 mL via ORAL
  Filled 2020-06-17 (×2): qty 30

## 2020-06-17 NOTE — Plan of Care (Signed)

## 2020-06-17 NOTE — Plan of Care (Signed)
Received patient from PACU awake but sleepy. Settled into room, assessment completed. Will continue to monitor as per orders/plan of care.

## 2020-06-17 NOTE — Anesthesia Postprocedure Evaluation (Signed)
Anesthesia Post Note  Patient: Mackenzie Gonzalez  Procedure(s) Performed: Open debridement of skin subcutaneous tissue and bone associated with open grade 2 fracture right hand;Radiographs 3 views right hand Complex wound closure degloving injury dorsal aspect of the hand greater than 10 cm. Open reduction and internal fixation right small finger metacarpal shaft  (Right Hand) Open reduction internal fixation displaced intra-articular distal radius fracture left wrist 3 more fragments; Radiographs 3 views left wrist Left wrist brachioadialis tendon release, tendon tenotomy (Left Wrist)     Patient location during evaluation: PACU Anesthesia Type: General Level of consciousness: awake and alert Pain management: pain level controlled Vital Signs Assessment: post-procedure vital signs reviewed and stable Respiratory status: spontaneous breathing, nonlabored ventilation and respiratory function stable Cardiovascular status: blood pressure returned to baseline and stable Postop Assessment: no apparent nausea or vomiting Anesthetic complications: no   No complications documented.  Last Vitals:  Vitals:   06/16/20 2155 06/16/20 2225  BP: (!) 165/78 (!) 161/93  Pulse: 79 77  Resp: 13 18  Temp: 36.6 C 36.8 C  SpO2: 93% 92%    Last Pain:  Vitals:   06/16/20 2225  TempSrc: Oral  PainSc:                  Lady Wisham,W. EDMOND
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
## 2020-06-17 NOTE — Progress Notes (Signed)
Pt had 2 episodes of emesis on of about 350cc the other 400cc; both dark red.  Dr Caralyn Guile Notified via telephone.

## 2020-06-18 ENCOUNTER — Inpatient Hospital Stay (HOSPITAL_COMMUNITY): Payer: Medicaid Other | Admitting: Anesthesiology

## 2020-06-18 ENCOUNTER — Encounter (HOSPITAL_COMMUNITY): Payer: Self-pay | Admitting: Orthopedic Surgery

## 2020-06-18 ENCOUNTER — Encounter (HOSPITAL_COMMUNITY)
Admission: EM | Disposition: A | Discharge status: Home Health | Payer: Self-pay | Source: Home / Self Care | Attending: Orthopedic Surgery

## 2020-06-18 ENCOUNTER — Inpatient Hospital Stay (HOSPITAL_COMMUNITY): Payer: Medicaid Other

## 2020-06-18 DIAGNOSIS — D500 Iron deficiency anemia secondary to blood loss (chronic): Secondary | ICD-10-CM | POA: Diagnosis not present

## 2020-06-18 DIAGNOSIS — K208 Other esophagitis without bleeding: Secondary | ICD-10-CM | POA: Diagnosis present

## 2020-06-18 DIAGNOSIS — S6990XS Unspecified injury of unspecified wrist, hand and finger(s), sequela: Secondary | ICD-10-CM | POA: Diagnosis not present

## 2020-06-18 DIAGNOSIS — F17200 Nicotine dependence, unspecified, uncomplicated: Secondary | ICD-10-CM | POA: Diagnosis present

## 2020-06-18 DIAGNOSIS — K209 Esophagitis, unspecified without bleeding: Secondary | ICD-10-CM | POA: Diagnosis present

## 2020-06-18 DIAGNOSIS — K920 Hematemesis: Secondary | ICD-10-CM | POA: Diagnosis not present

## 2020-06-18 DIAGNOSIS — F99 Mental disorder, not otherwise specified: Secondary | ICD-10-CM

## 2020-06-18 DIAGNOSIS — K2091 Esophagitis, unspecified with bleeding: Secondary | ICD-10-CM | POA: Diagnosis not present

## 2020-06-18 DIAGNOSIS — B20 Human immunodeficiency virus [HIV] disease: Secondary | ICD-10-CM | POA: Diagnosis present

## 2020-06-18 DIAGNOSIS — F410 Panic disorder [episodic paroxysmal anxiety] without agoraphobia: Secondary | ICD-10-CM

## 2020-06-18 HISTORY — PX: ESOPHAGOGASTRODUODENOSCOPY (EGD) WITH PROPOFOL: SHX5813

## 2020-06-18 HISTORY — PX: BIOPSY: SHX5522

## 2020-06-18 LAB — BASIC METABOLIC PANEL
Anion gap: 7 (ref 5–15)
BUN: 20 mg/dL (ref 6–20)
CO2: 25 mmol/L (ref 22–32)
Calcium: 8.3 mg/dL — ABNORMAL LOW (ref 8.9–10.3)
Chloride: 103 mmol/L (ref 98–111)
Creatinine, Ser: 0.63 mg/dL (ref 0.44–1.00)
GFR, Estimated: >60 mL/min (ref >60)
Glucose, Bld: 114 mg/dL — ABNORMAL HIGH (ref 70–99)
Potassium: 4.7 mmol/L (ref 3.5–5.1)
Sodium: 135 mmol/L (ref 135–145)

## 2020-06-18 LAB — CBC
HCT: 31 % — ABNORMAL LOW (ref 36.0–46.0)
Hemoglobin: 10.6 g/dL — ABNORMAL LOW (ref 12.0–15.0)
MCH: 34.6 pg — ABNORMAL HIGH (ref 26.0–34.0)
MCHC: 34.2 g/dL (ref 30.0–36.0)
MCV: 101.3 fL — ABNORMAL HIGH (ref 80.0–100.0)
Platelets: 185 10*3/uL (ref 150–400)
RBC: 3.06 MIL/uL — ABNORMAL LOW (ref 3.87–5.11)
RDW: 13 % (ref 11.5–15.5)
WBC: 16.5 10*3/uL — ABNORMAL HIGH (ref 4.0–10.5)
nRBC: 0 % (ref 0.0–0.2)

## 2020-06-18 LAB — HEMOGLOBIN AND HEMATOCRIT, BLOOD
HCT: 30.1 % — ABNORMAL LOW (ref 36.0–46.0)
HCT: 31.1 % — ABNORMAL LOW (ref 36.0–46.0)
HCT: 31.7 % — ABNORMAL LOW (ref 36.0–46.0)
Hemoglobin: 10 g/dL — ABNORMAL LOW (ref 12.0–15.0)
Hemoglobin: 10.2 g/dL — ABNORMAL LOW (ref 12.0–15.0)
Hemoglobin: 10.5 g/dL — ABNORMAL LOW (ref 12.0–15.0)

## 2020-06-18 LAB — SARS CORONAVIRUS 2 (TAT 6-24 HRS): SARS Coronavirus 2: NEGATIVE

## 2020-06-18 SURGERY — ESOPHAGOGASTRODUODENOSCOPY (EGD) WITH PROPOFOL
Anesthesia: General

## 2020-06-18 MED ORDER — SUCCINYLCHOLINE CHLORIDE 20 MG/ML IJ SOLN
INTRAMUSCULAR | Status: DC | PRN
  Administered 2020-06-18: 100 mg via INTRAVENOUS

## 2020-06-18 MED ORDER — SODIUM CHLORIDE 0.9 % IV SOLN
80.0000 mg | Freq: Once | INTRAVENOUS | Status: DC
  Filled 2020-06-18: qty 80

## 2020-06-18 MED ORDER — EPHEDRINE SULFATE 50 MG/ML IJ SOLN
INTRAMUSCULAR | Status: DC | PRN
  Administered 2020-06-18: 5 mg via INTRAVENOUS

## 2020-06-18 MED ORDER — FENTANYL CITRATE (PF) 100 MCG/2ML IJ SOLN
INTRAMUSCULAR | Status: DC | PRN
  Administered 2020-06-18: 50 ug via INTRAVENOUS

## 2020-06-18 MED ORDER — PANTOPRAZOLE SODIUM 40 MG IV SOLR: 40.0000 mg | Freq: Two times a day (BID) | INTRAVENOUS | Status: DC

## 2020-06-18 MED ORDER — FENTANYL CITRATE (PF) 100 MCG/2ML IJ SOLN
INTRAMUSCULAR | Status: AC
  Filled 2020-06-18: qty 2

## 2020-06-18 MED ORDER — ALPRAZOLAM 0.5 MG PO TABS: 1.0000 mg | ORAL_TABLET | Freq: Three times a day (TID) | ORAL | Status: DC | PRN

## 2020-06-18 MED ORDER — LIDOCAINE 2% (20 MG/ML) 5 ML SYRINGE
INTRAMUSCULAR | Status: DC | PRN
  Administered 2020-06-18: 60 mg via INTRAVENOUS

## 2020-06-18 MED ORDER — ONDANSETRON HCL 4 MG/2ML IJ SOLN
INTRAMUSCULAR | Status: DC | PRN
  Administered 2020-06-18: 4 mg via INTRAVENOUS

## 2020-06-18 MED ORDER — SODIUM CHLORIDE 0.9 % IV SOLN
8.0000 mg/h | INTRAVENOUS | Status: DC
  Filled 2020-06-18: qty 80

## 2020-06-18 MED ORDER — DEXAMETHASONE SODIUM PHOSPHATE 10 MG/ML IJ SOLN
INTRAMUSCULAR | Status: DC | PRN
  Administered 2020-06-18: 5 mg via INTRAVENOUS

## 2020-06-18 MED ORDER — PROPOFOL 10 MG/ML IV BOLUS
INTRAVENOUS | Status: DC | PRN
  Administered 2020-06-18: 120 mg via INTRAVENOUS

## 2020-06-18 MED ORDER — SUCRALFATE 1 GM/10ML PO SUSP
1.0000 g | Freq: Three times a day (TID) | ORAL | Status: DC
  Administered 2020-06-18 – 2020-06-21 (×12): 1 g via ORAL
  Filled 2020-06-18 (×12): qty 10

## 2020-06-18 MED ORDER — LACTATED RINGERS IV SOLN: INTRAVENOUS | Status: DC | PRN

## 2020-06-18 MED ORDER — SUCRALFATE 1 GM/10ML PO SUSP
1.0000 g | Freq: Three times a day (TID) | ORAL | Status: DC
  Filled 2020-06-18 (×3): qty 10

## 2020-06-18 MED ORDER — GABAPENTIN 400 MG PO CAPS
400.0000 mg | ORAL_CAPSULE | Freq: Three times a day (TID) | ORAL | Status: DC
  Administered 2020-06-19 – 2020-06-20 (×5): 400 mg via ORAL
  Filled 2020-06-18 (×6): qty 1

## 2020-06-18 MED ORDER — LACTATED RINGERS IV SOLN
INTRAVENOUS | Status: AC | PRN
  Administered 2020-06-18: 20 mL/h via INTRAVENOUS

## 2020-06-18 MED ORDER — QUETIAPINE FUMARATE 300 MG PO TABS
300.0000 mg | ORAL_TABLET | Freq: Every day | ORAL | Status: DC
  Administered 2020-06-18 – 2020-06-20 (×3): 300 mg via ORAL
  Filled 2020-06-18: qty 1
  Filled 2020-06-18 (×2): qty 6
  Filled 2020-06-18: qty 1

## 2020-06-18 MED ORDER — CITALOPRAM HYDROBROMIDE 20 MG PO TABS
20.0000 mg | ORAL_TABLET | Freq: Every day | ORAL | Status: DC
  Administered 2020-06-18 – 2020-06-21 (×4): 20 mg via ORAL
  Filled 2020-06-18 (×4): qty 1

## 2020-06-18 MED ORDER — ELVITEG-COBIC-EMTRICIT-TENOFAF 150-150-200-10 MG PO TABS
1.0000 | ORAL_TABLET | Freq: Every morning | ORAL | Status: DC
  Administered 2020-06-19 – 2020-06-21 (×3): 1 via ORAL
  Filled 2020-06-18 (×3): qty 1

## 2020-06-18 SURGICAL SUPPLY — 14 items

## 2020-06-18 NOTE — Anesthesia Preprocedure Evaluation (Addendum)
Anesthesia Evaluation  Patient identified by MRN, date of birth, ID band Patient awake    Reviewed: Allergy & Precautions, H&P , NPO status , Patient's Chart, lab work & pertinent test results  Airway Mallampati: II  TM Distance: >3 FB Neck ROM: Full    Dental no notable dental hx. (+) Teeth Intact, Dental Advisory Given   Pulmonary Current Smoker and Patient abstained from smoking.,    Pulmonary exam normal breath sounds clear to auscultation       Cardiovascular negative cardio ROS   Rhythm:Regular Rate:Normal     Neuro/Psych negative neurological ROS  negative psych ROS   GI/Hepatic negative GI ROS, Neg liver ROS,   Endo/Other  negative endocrine ROS  Renal/GU negative Renal ROS  negative genitourinary   Musculoskeletal   Abdominal   Peds  Hematology  (+) Blood dyscrasia, anemia , HIV,   Anesthesia Other Findings   Reproductive/Obstetrics negative OB ROS                          Anesthesia Physical Anesthesia Plan  ASA: II  Anesthesia Plan: General   Post-op Pain Management:    Induction: Intravenous, Rapid sequence and Cricoid pressure planned  PONV Risk Score and Plan: 1 and Ondansetron  Airway Management Planned: Oral ETT  Additional Equipment:   Intra-op Plan:   Post-operative Plan: Extubation in OR  Informed Consent: I have reviewed the patients History and Physical, chart, labs and discussed the procedure including the risks, benefits and alternatives for the proposed anesthesia with the patient or authorized representative who has indicated his/her understanding and acceptance.     Dental advisory given  Plan Discussed with: CRNA  Anesthesia Plan Comments:        Anesthesia Quick Evaluation

## 2020-06-18 NOTE — Progress Notes (Signed)
Patient complaining of nausea, bloody sputum, chest pain that "might be something stuck in her throat", EKG ordered, patient NPO. Zofran given. Additionally patient states her mom has Covid. PA paged.

## 2020-06-18 NOTE — Anesthesia Procedure Notes (Signed)
Procedure Name: Intubation Date/Time: 06/18/2020 3:39 PM Performed by: Suzy Bouchard, CRNA Pre-anesthesia Checklist: Patient identified, Emergency Drugs available, Suction available, Patient being monitored and Timeout performed Patient Re-evaluated:Patient Re-evaluated prior to induction Oxygen Delivery Method: Circle system utilized Preoxygenation: Pre-oxygenation with 100% oxygen Induction Type: IV induction and Rapid sequence Laryngoscope Size: Glidescope and 3 Grade View: Grade I Tube type: Oral Tube size: 7.0 mm Number of attempts: 1 Airway Equipment and Method: Video-laryngoscopy and Stylet Placement Confirmation: ETT inserted through vocal cords under direct vision,  positive ETCO2 and breath sounds checked- equal and bilateral Secured at: 23 cm Tube secured with: Tape Dental Injury: Teeth and Oropharynx as per pre-operative assessment  Comments: Elective glidescope (patient on ICU bed), COVID pending.

## 2020-06-18 NOTE — Transfer of Care (Signed)
Immediate Anesthesia Transfer of Care Note  Patient: Mackenzie Gonzalez  Procedure(s) Performed: ESOPHAGOGASTRODUODENOSCOPY (EGD) WITH PROPOFOL (N/A )  Patient Location: Endoscopy Unit  Anesthesia Type:General  Level of Consciousness: awake, alert  and responds to stimulation  Airway & Oxygen Therapy: Patient Spontanous Breathing and Patient connected to nasal cannula oxygen  Post-op Assessment: Report given to RN and Post -op Vital signs reviewed and stable  Post vital signs: Reviewed and stable  Last Vitals:  Vitals Value Taken Time  BP    Temp    Pulse    Resp    SpO2      Last Pain:  Vitals:   06/18/20 1517  TempSrc: Temporal  PainSc: 7       Patients Stated Pain Goal: 0 (02/54/27 0623)  Complications: No complications documented.

## 2020-06-18 NOTE — Progress Notes (Signed)
Patient concerned about being discharged home by self with nobody to help her with ADL's. Says that "she may need rehab". May benefit from PT/OT consult.

## 2020-06-18 NOTE — Progress Notes (Signed)
Pt in hospital with non-orthopaedic medical issues. Have spoken to [facility] who will kindly assume care of patient on hospitalist service until discharge  [place] 1.KEEP BANDAGES/SPLINT ON AT ALL TIMES 2. OK TO MOVE FINGERS ON LEFT HAND 3. NON WEIGHTBEARING LEFT WRIST 4. WILL NEED TO F/U IN OFFICE IN 2 WEEKS POST OP 5. TRY TO KEEP HANDS ELEVATED 6. MAY NEED ASSISTANCE AT HOME WITH ADLS, HOME OT? 7. CONSULT PLACED TO CASE MANAGEMENT FOR DISCHARGE AND HOME NEEDS ASSESSMENT 8. TEXT OR CALL ME IF QUESTIONS REGARDING HANDS [phone]

## 2020-06-18 NOTE — Anesthesia Postprocedure Evaluation (Signed)
Anesthesia Post Note  Patient: Mackenzie Gonzalez  Procedure(s) Performed: ESOPHAGOGASTRODUODENOSCOPY (EGD) WITH PROPOFOL (N/A )     Patient location during evaluation: Endoscopy Anesthesia Type: General Level of consciousness: awake and alert Pain management: pain level controlled Vital Signs Assessment: post-procedure vital signs reviewed and stable Respiratory status: spontaneous breathing, nonlabored ventilation and respiratory function stable Cardiovascular status: blood pressure returned to baseline and stable Postop Assessment: no apparent nausea or vomiting Anesthetic complications: no   No complications documented.  Last Vitals:  Vitals:   06/18/20 1620 06/18/20 1627  BP: (!) 161/61 (!) 169/76  Pulse: 80 81  Resp: 17 15  Temp:    SpO2: 100% 97%    Last Pain:  Vitals:   06/18/20 1612  TempSrc: Temporal  PainSc: 0-No pain                 Clevon Khader,W. EDMOND
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
## 2020-06-18 NOTE — Op Note (Addendum)
[facility] Patient Name: Mackenzie Gonzalez Procedure Date : 06/18/2020 MRN: [id number] Attending MD: [name] MD, MD Date of Birth: [birth year] CSN: [id number] Age: 60 Admit Type: Inpatient Procedure:                Upper GI endoscopy Indications:              Hematemesis, progresive anemia                           Recent outpatient complaints of nausea, dysphagia,                            globus                           Well controlled HIV on GENVOYA Providers:                [name] MD, MD, Grace Isaac, RN, Lesia Sago, Technician, Clearnce Sorrel, CRNA Referring MD:              Medicines:                General Anesthesia Complications:            No immediate complications. Estimated blood loss:                            Minimal. Estimated Blood Loss:     Estimated blood loss was minimal. Procedure:                Pre-Anesthesia Assessment:                           - Prior to the procedure, a History and Physical                            was performed, and patient medications and                            allergies were reviewed. The patient's tolerance of                            previous anesthesia was also reviewed. The risks                            and benefits of the procedure and the sedation                            options and risks were discussed with the patient.                            All questions were answered, and informed consent                            was obtained. Prior Anticoagulants: The patient has  taken no previous anticoagulant or antiplatelet                            agents. ASA Grade Assessment: II - A patient with                            mild systemic disease. After reviewing the risks                            and benefits, the patient was deemed in                            satisfactory condition to undergo the procedure.                            After obtaining informed consent, the endoscope was                            passed under direct vision. Throughout the                            procedure, the patient's blood pressure, pulse, and                            oxygen saturations were monitored continuously. The                            GIF-H190 ([id number] Olympus gastroscope was                            introduced through the mouth, and advanced to the                            third part of duodenum. The upper GI endoscopy was                            accomplished without difficulty. The patient                            tolerated the procedure well. Scope In: Scope Out: Findings:      LA Grade D (one or more mucosal breaks involving at least 75% of       esophageal circumference) esophagitis with bleeding was found 25 cm from       the incisors to the z-line located 45 cm from the incisors. Biopsies       were taken with a cold forceps for histology. Estimated blood loss was       minimal.      The stomach was normal. No mucosal abnormalities. Old blood seen.      The examined duodenum was normal. No mucosal abnormalities. Old blood       seen. Impression:               - LA Grade D esophagitis with bleeding. Biopsied.                           -  Normal stomach.                           - Normal examined duodenum. Recommendation:           - Return patient to hospital ward for ongoing care.                           - Advance diet as tolerated.                           - Serial hgb/hct with transfusion as indicated.                           - Continue present medications.                           - Pantoprazole 40 mg BID for at least 10 weeks.                            Would give IV until taking adequate PO.                           - Carafate 1 gram slurry QID for 2 weeks.                           - Await pathology results.                           - Keep the head of the bed elevated during  meals                            and for at least 3 hours after eating.                           - Follow-up with GI as an outpatient if symptoms                            persist despite treatment.                           Dr. Caralyn Guile was updated. The results and                            recommendations were discussed with the patient                            before she returned to her hospital room. She was                            given a copy of the procedure note.                           The inpatient GI team will move to stand-by. Please  call the on-call gastroenterologist with any                            additional questions or concerns during this                            hospitalization. Procedure Code(s):        --- Professional ---                           [phone], Esophagogastroduodenoscopy, flexible,                            transoral; with biopsy, single or multiple Diagnosis Code(s):        --- Professional ---                           K20.91, Esophagitis, unspecified with bleeding                           K92.0, Hematemesis CPT copyright 2019 [employer]. All rights reserved. The codes documented in this report are preliminary and upon coder review may  be revised to meet current compliance requirements. [name] MD, MD 06/18/2020 4:12:14 PM This report has been signed electronically. Number of Addenda: 0
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
## 2020-06-18 NOTE — Progress Notes (Signed)
Spoke with GI With declining H/H  Consult to GI for declining h/H and emesis yesterday Will make npo

## 2020-06-19 ENCOUNTER — Encounter (HOSPITAL_COMMUNITY): Payer: Self-pay | Admitting: Gastroenterology

## 2020-06-19 ENCOUNTER — Other Ambulatory Visit (HOSPITAL_COMMUNITY): Payer: Self-pay

## 2020-06-19 ENCOUNTER — Other Ambulatory Visit: Payer: Self-pay

## 2020-06-19 DIAGNOSIS — S6990XS Unspecified injury of unspecified wrist, hand and finger(s), sequela: Secondary | ICD-10-CM | POA: Diagnosis not present

## 2020-06-19 LAB — HEMOGLOBIN AND HEMATOCRIT, BLOOD
HCT: 28.4 % — ABNORMAL LOW (ref 36.0–46.0)
Hemoglobin: 9.4 g/dL — ABNORMAL LOW (ref 12.0–15.0)

## 2020-06-19 MED ORDER — ALBUTEROL SULFATE HFA 108 (90 BASE) MCG/ACT IN AERS
1.0000 | INHALATION_SPRAY | Freq: Four times a day (QID) | RESPIRATORY_TRACT | Status: DC | PRN
  Administered 2020-06-19: 2 via RESPIRATORY_TRACT
  Filled 2020-06-19: qty 6.7

## 2020-06-19 MED ORDER — PANTOPRAZOLE SODIUM 40 MG PO TBEC: 40.0000 mg | DELAYED_RELEASE_TABLET | Freq: Two times a day (BID) | ORAL | Status: DC

## 2020-06-19 MED ORDER — ACETAMINOPHEN 325 MG PO TABS: 650.0000 mg | ORAL_TABLET | Freq: Four times a day (QID) | ORAL | Status: DC | PRN

## 2020-06-19 MED ORDER — PANTOPRAZOLE SODIUM 40 MG PO TBEC
40.0000 mg | DELAYED_RELEASE_TABLET | Freq: Two times a day (BID) | ORAL | Status: DC
  Administered 2020-06-19 – 2020-06-21 (×4): 40 mg via ORAL
  Filled 2020-06-19 (×4): qty 1
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
## 2020-06-20 ENCOUNTER — Other Ambulatory Visit (HOSPITAL_COMMUNITY): Payer: Self-pay

## 2020-06-20 DIAGNOSIS — S6990XS Unspecified injury of unspecified wrist, hand and finger(s), sequela: Secondary | ICD-10-CM | POA: Diagnosis not present

## 2020-06-20 LAB — BASIC METABOLIC PANEL
Anion gap: 8 (ref 5–15)
BUN: 21 mg/dL — ABNORMAL HIGH (ref 6–20)
CO2: 25 mmol/L (ref 22–32)
Calcium: 8.5 mg/dL — ABNORMAL LOW (ref 8.9–10.3)
Chloride: 101 mmol/L (ref 98–111)
Creatinine, Ser: 0.74 mg/dL (ref 0.44–1.00)
GFR, Estimated: >60 mL/min (ref >60)
Glucose, Bld: 103 mg/dL — ABNORMAL HIGH (ref 70–99)
Potassium: 5.1 mmol/L (ref 3.5–5.1)
Sodium: 134 mmol/L — ABNORMAL LOW (ref 135–145)

## 2020-06-20 LAB — CBC
HCT: 31.8 % — ABNORMAL LOW (ref 36.0–46.0)
Hemoglobin: 10.9 g/dL — ABNORMAL LOW (ref 12.0–15.0)
MCH: 34.8 pg — ABNORMAL HIGH (ref 26.0–34.0)
MCHC: 34.3 g/dL (ref 30.0–36.0)
MCV: 101.6 fL — ABNORMAL HIGH (ref 80.0–100.0)
Platelets: 129 10*3/uL — ABNORMAL LOW (ref 150–400)
RBC: 3.13 MIL/uL — ABNORMAL LOW (ref 3.87–5.11)
RDW: 13.2 % (ref 11.5–15.5)
WBC: 9.5 10*3/uL (ref 4.0–10.5)
nRBC: 0 % (ref 0.0–0.2)

## 2020-06-20 LAB — FOLATE: Folate: 9.6 ng/mL (ref >5.9)

## 2020-06-20 LAB — AMMONIA: Ammonia: 20 umol/L (ref 9–35)

## 2020-06-20 LAB — VITAMIN B12: Vitamin B-12: 146 pg/mL — ABNORMAL LOW (ref 180–914)

## 2020-06-20 LAB — SURGICAL PATHOLOGY

## 2020-06-20 MED ORDER — GABAPENTIN 100 MG PO CAPS
200.0000 mg | ORAL_CAPSULE | Freq: Three times a day (TID) | ORAL | Status: DC
  Administered 2020-06-20 – 2020-06-21 (×2): 200 mg via ORAL
  Filled 2020-06-20 (×2): qty 2

## 2020-06-20 MED ORDER — ACETAMINOPHEN 325 MG PO TABS: 650.0000 mg | ORAL_TABLET | Freq: Four times a day (QID) | ORAL | Status: DC | PRN

## 2020-06-20 MED ORDER — OXYCODONE HCL 5 MG PO TABS
5.0000 mg | ORAL_TABLET | ORAL | Status: DC | PRN
  Administered 2020-06-21 (×2): 5 mg via ORAL
  Filled 2020-06-20: qty 2
  Filled 2020-06-20: qty 1

## 2020-06-20 MED ORDER — MENTHOL 3 MG MT LOZG
1.0000 | LOZENGE | OROMUCOSAL | Status: DC | PRN
  Administered 2020-06-20: 3 mg via ORAL
  Filled 2020-06-20: qty 9
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
## 2020-06-21 ENCOUNTER — Other Ambulatory Visit (HOSPITAL_COMMUNITY): Payer: Self-pay

## 2020-06-21 ENCOUNTER — Encounter (HOSPITAL_COMMUNITY): Payer: Self-pay | Admitting: Psychiatry

## 2020-06-21 LAB — CBC
HCT: 28.3 % — ABNORMAL LOW (ref 36.0–46.0)
Hemoglobin: 9.9 g/dL — ABNORMAL LOW (ref 12.0–15.0)
MCH: 35 pg — ABNORMAL HIGH (ref 26.0–34.0)
MCHC: 35 g/dL (ref 30.0–36.0)
MCV: 100 fL (ref 80.0–100.0)
Platelets: 164 10*3/uL (ref 150–400)
RBC: 2.83 MIL/uL — ABNORMAL LOW (ref 3.87–5.11)
RDW: 12.9 % (ref 11.5–15.5)
WBC: 7.6 10*3/uL (ref 4.0–10.5)
nRBC: 0 % (ref 0.0–0.2)

## 2020-06-21 LAB — COMPREHENSIVE METABOLIC PANEL
ALT: 13 U/L (ref 0–44)
AST: 14 U/L — ABNORMAL LOW (ref 15–41)
Albumin: 2.6 g/dL — ABNORMAL LOW (ref 3.5–5.0)
Alkaline Phosphatase: 51 U/L (ref 38–126)
Anion gap: 6 (ref 5–15)
BUN: 18 mg/dL (ref 6–20)
CO2: 27 mmol/L (ref 22–32)
Calcium: 8.6 mg/dL — ABNORMAL LOW (ref 8.9–10.3)
Chloride: 104 mmol/L (ref 98–111)
Creatinine, Ser: 0.81 mg/dL (ref 0.44–1.00)
GFR, Estimated: >60 mL/min (ref >60)
Glucose, Bld: 106 mg/dL — ABNORMAL HIGH (ref 70–99)
Potassium: 4.2 mmol/L (ref 3.5–5.1)
Sodium: 137 mmol/L (ref 135–145)
Total Bilirubin: 1 mg/dL (ref 0.3–1.2)
Total Protein: 5.5 g/dL — ABNORMAL LOW (ref 6.5–8.1)

## 2020-06-21 LAB — RPR: RPR Ser Ql: NONREACTIVE

## 2020-06-21 MED ORDER — HYDROCODONE-ACETAMINOPHEN 5-325 MG PO TABS
1.0000 | ORAL_TABLET | Freq: Four times a day (QID) | ORAL | 0 refills | Status: DC | PRN
  Filled 2020-06-21: qty 20, 5d supply, fill #0

## 2020-06-21 MED ORDER — SUCRALFATE 1 GM/10ML PO SUSP
1.0000 g | Freq: Three times a day (TID) | ORAL | 0 refills | Status: DC
  Filled 2020-06-21: qty 520, 13d supply, fill #0

## 2020-06-21 MED ORDER — PANTOPRAZOLE SODIUM 40 MG PO TBEC
40.0000 mg | DELAYED_RELEASE_TABLET | Freq: Two times a day (BID) | ORAL | 0 refills | Status: DC
  Filled 2020-06-21: qty 60, 30d supply, fill #0

## 2020-06-21 NOTE — Discharge Summary (Signed)
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  in effect (meaning this test can be used) for the duration of the COVID-19 declaration under Section 564(b)(1) of the Act, 21 U.S.C.section 360bbb-3(b)(1), unless the authorization is terminated  or revoked sooner.       Influenza A by PCR NEGATIVE NEGATIVE Final   Influenza B by PCR NEGATIVE NEGATIVE Final    Comment: (NOTE) The Xpert Xpress SARS-CoV-2/FLU/RSV plus assay is intended as an aid in the diagnosis of influenza from Nasopharyngeal swab specimens and should not be used as a sole basis for treatment. Nasal washings and aspirates are unacceptable for Xpert Xpress  SARS-CoV-2/FLU/RSV testing.  Fact Sheet for Patients: EntrepreneurPulse.com.au  Fact Sheet for Healthcare Providers: IncredibleEmployment.be  This test is not yet approved or cleared by the [place] FDA and has been authorized for detection and/or diagnosis of SARS-CoV-2 by FDA under an Emergency Use Authorization (EUA). This EUA will remain in effect (meaning this test can be used) for the duration of the COVID-19 declaration under Section 564(b)(1) of the Act, 21 U.S.C. section 360bbb-3(b)(1), unless the authorization is terminated or revoked.  Performed at [facility], [place] [address] [place], [place] [address]   SARS CORONAVIRUS 2 (TAT 6-24 HRS) Nasopharyngeal Nasopharyngeal Swab     Status: None   Collection Time: 06/18/20 12:40 PM   Specimen: Nasopharyngeal Swab  Result Value Ref Range Status   SARS Coronavirus 2 NEGATIVE NEGATIVE Final    Comment: (NOTE) SARS-CoV-2 target  nucleic acids are NOT DETECTED.  The SARS-CoV-2 RNA is generally detectable in upper and lower respiratory specimens during the acute phase of infection. Negative results do not preclude SARS-CoV-2 infection, do not rule out co-infections with other pathogens, and should not be used as the sole basis for treatment or other patient management decisions. Negative results must be combined with clinical observations, patient history, and epidemiological information. The expected result is Negative.  Fact Sheet for Patients: SugarRoll.be  Fact Sheet for Healthcare Providers: https://www.woods-mathews.com/  This test is not yet approved or cleared by the [place] FDA and  has been authorized for detection and/or diagnosis of SARS-CoV-2 by FDA under an Emergency Use Authorization (EUA). This EUA will remain  in effect (meaning this test can be used) for the duration of the COVID-19 declaration under Se ction  564(b)(1) of the Act, 21 U.S.C. section 360bbb-3(b)(1), unless the authorization is terminated or revoked sooner.  Performed at [facility], [place] [address], [place], [place] [address]      Time coordinating discharge: 35 minutes  SIGNED:   Cordelia Poche, MD [facility] 06/21/2020, 1:45 PM
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
## 2020-06-21 NOTE — Plan of Care (Signed)

## 2020-06-22 ENCOUNTER — Encounter: Payer: Self-pay | Admitting: Gastroenterology

## 2020-06-23 ENCOUNTER — Telehealth: Payer: Self-pay | Admitting: Student

## 2020-06-23 NOTE — Telephone Encounter (Signed)
Pt hosp f/u per pt 06/29/2020 845am 1 week f/u/[place]
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
## 2020-06-25 DIAGNOSIS — S62102S Fracture of unspecified carpal bone, left wrist, sequela: Secondary | ICD-10-CM | POA: Insufficient documentation

## 2020-06-25 NOTE — Assessment & Plan Note (Addendum)
Pt presents for hospital follow up. She developed hematemesis and rapid drop in hgb. She underwent EGD 5/22 which was significant for Grade D esophagitis at which time biopsy was obtained. She was discharged with carafate and protonix. Discharge hemoglobin 9.9.  Pathology results reviewed and show inflammatory changes without evidence of malignancy or dysplasia.   Plan -check CBC today -continue BID protonix dosing for a total of 8w (through 08/18/20), then decrease to once daily dosing -continue carafate for at least another week -f/u with GI as scheduled  Addendum #1 06/30/20: hemoglobin up 9.9>10.4 from discharge. Plan as stated above
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
## 2020-06-27 ENCOUNTER — Other Ambulatory Visit (HOSPITAL_COMMUNITY): Payer: Self-pay
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
## 2020-06-28 ENCOUNTER — Other Ambulatory Visit: Payer: Self-pay

## 2020-06-28 ENCOUNTER — Other Ambulatory Visit (HOSPITAL_COMMUNITY): Payer: Self-pay

## 2020-06-29 ENCOUNTER — Other Ambulatory Visit: Payer: Self-pay

## 2020-06-29 ENCOUNTER — Other Ambulatory Visit (HOSPITAL_COMMUNITY): Payer: Self-pay

## 2020-06-29 ENCOUNTER — Ambulatory Visit: Payer: Medicaid Other | Admitting: Internal Medicine

## 2020-06-29 VITALS — BP 108/77 | HR 70 | Temp 98.1°F | Ht 65.0 in | Wt 187.8 lb

## 2020-06-29 DIAGNOSIS — Z09 Encounter for follow-up examination after completed treatment for conditions other than malignant neoplasm: Secondary | ICD-10-CM

## 2020-06-29 DIAGNOSIS — K208 Other esophagitis without bleeding: Secondary | ICD-10-CM | POA: Diagnosis not present

## 2020-06-29 DIAGNOSIS — S62102S Fracture of unspecified carpal bone, left wrist, sequela: Secondary | ICD-10-CM | POA: Diagnosis not present

## 2020-06-29 DIAGNOSIS — J410 Simple chronic bronchitis: Secondary | ICD-10-CM | POA: Diagnosis not present

## 2020-06-29 DIAGNOSIS — D500 Iron deficiency anemia secondary to blood loss (chronic): Secondary | ICD-10-CM

## 2020-06-29 DIAGNOSIS — R1310 Dysphagia, unspecified: Secondary | ICD-10-CM

## 2020-06-29 MED ORDER — ALBUTEROL SULFATE HFA 108 (90 BASE) MCG/ACT IN AERS
2.0000 | INHALATION_SPRAY | Freq: Four times a day (QID) | RESPIRATORY_TRACT | 6 refills | Status: DC | PRN
  Filled 2020-06-29: qty 8.5, 25d supply, fill #0
  Filled 2020-07-13 – 2020-08-14 (×2): qty 8.5, 25d supply, fill #1
  Filled 2020-09-11: qty 8.5, 25d supply, fill #2

## 2020-06-29 MED ORDER — OXYCODONE-ACETAMINOPHEN 7.5-325 MG PO TABS
1.0000 | ORAL_TABLET | Freq: Four times a day (QID) | ORAL | 0 refills | Status: AC | PRN
  Filled 2020-06-29: qty 20, 5d supply, fill #0

## 2020-06-29 MED ORDER — PANTOPRAZOLE SODIUM 40 MG PO TBEC
40.0000 mg | DELAYED_RELEASE_TABLET | Freq: Two times a day (BID) | ORAL | 0 refills | Status: DC
  Filled 2020-06-29: qty 140, 70d supply, fill #0

## 2020-06-29 NOTE — Patient Instructions (Signed)
I am happy to see you are doing well today.  It is very important that you take the Protonix twice a day for the esophagitis. I will also send oxycodone into your pharmacy for pain control. Please note that this is only intended short term and your pain should improve over the next few days. In regards to the constipation, please pick up some Miralax and senokot. These medications are over the counter however the pharmacy should be able to help you find them.   I will check your hemoglobin today and let you know if it is largely abnormal.   Esophagitis  Esophagitis is inflammation of the esophagus. The esophagus is the tube that carries food from the mouth to the stomach. Esophagitis can cause soreness or pain in the esophagus. This condition can make it difficult and painful to swallow. What are the causes? Most causes of esophagitis are not serious. Common causes of this condition include:  Gastroesophageal reflux disease (GERD). This is when stomach contents move back up into the esophagus (reflux).  Repeated vomiting.  An allergic reaction, especially caused by food allergies (eosinophilic esophagitis).  Injury to the esophagus by swallowing large pills with or without water, or swallowing certain types of medicines.  Swallowing harmful chemicals, such as household cleaning products.  Drinking a lot of alcohol.  An infection of the esophagus. This most often occurs in people who have a weakened immune system.  Radiation or chemotherapy treatment for cancer.  Certain diseases such as sarcoidosis, Crohn's disease, and scleroderma. What are the signs or symptoms? Symptoms of this condition include:  Difficult or painful swallowing.  Pain with swallowing acidic liquids, such as citrus juices. You may also have pain when you burp.  Chest pain and difficulty breathing.  Nausea and vomiting.  Pain in the abdomen.  Weight loss.  Ulcers in the mouth and white patches in the  mouth (candidiasis).  Fever.  Coughing up blood or vomiting blood.  Stool that is black, tarry, or bright red. How is this diagnosed? This condition may be diagnosed based on your medical history and a physical exam. You may also have other tests, including:  A test to examine your esophagus and stomach with a small flexible tube with a camera (endoscopy).  A test that measures the acidity level in your esophagus.  A test that measures how much pressure is on your esophagus.  A barium swallow or modified barium swallow to show the shape, size, and functioning of your esophagus.  Allergy tests. How is this treated? Treatment for this condition depends on the cause of your esophagitis. In some cases, steroids or other medicines may be given to help relieve your symptoms or to treat the underlying cause of your condition. You may have to make some lifestyle changes, such as:  Avoiding alcohol.  Quitting any products that contain nicotine or tobacco. These products include cigarettes, chewing tobacco, and vaping devices, such as e-cigarettes. If you need help quitting, ask your health care provider.  Changing your diet.  Exercising.  Changing your sleep habits and your sleep environment. Follow these instructions at home: Medicines  Take over-the-counter and prescription medicines only as told by your health care provider.  Do not take aspirin, ibuprofen, or other NSAIDs unless your health care provider told you to do so.  If you have trouble taking pills: ? Use a pill splitter to decrease the size of the pill. This will decrease the chance of the pill getting stuck or injuring  your esophagus. ? Drink water after you take a pill. Eating and drinking  Avoid foods and drinks that seem to make your symptoms worse.  Follow a diet as recommended by your health care provider. This may involve avoiding foods and drinks such as: ? Coffee and tea, with or without caffeine. ? Drinks  that contain alcohol. ? Energy drinks and sports drinks. ? Carbonated drinks or sodas. ? Chocolate and cocoa. ? Peppermint and mint flavorings. ? Garlic and onions. ? Horseradish. ? Spicy and acidic foods, including peppers, chili powder, curry powder, vinegar, hot sauces, and barbecue sauce. ? Citrus fruit juices and citrus fruits, such as oranges, lemons, and limes. ? Tomato-based foods, such as red sauce, chili, salsa, and pizza with red sauce. ? Fried and fatty foods, such as donuts, french fries, potato chips, and high-fat dressings. ? High-fat meats, such as hot dogs and fatty cuts of red and white meats, such as rib eye steak, sausage, ham, and bacon. ? High-fat dairy items, such as whole milk, butter, and cream cheese.   Lifestyle  Eat small, frequent meals instead of large meals.  Avoid drinking large amounts of liquid with your meals.  Avoid eating meals during the 2-3 hours before bedtime.  Avoid lying down right after you eat.  Do not exercise right after you eat.  Do not use any products that contain nicotine or tobacco. These products include cigarettes, chewing tobacco, and vaping devices, such as e-cigarettes. If you need help quitting, ask your health care provider. General instructions  Pay attention to any changes in your symptoms. Let your health care provider know about them.  Wear loose-fitting clothing. Do not wear anything tight around your waist that causes pressure on your abdomen.  Raise (elevate) the head of your bed about 6 inches (15 cm). You may need to use a wedge to do this.  Try relaxation strategies such as yoga, deep breathing, or meditation to manage stress. If you need help reducing stress, ask your health care provider.  If you are overweight, reduce your weight to an amount that is healthy for you. Ask your health care provider for guidance about a safe weight loss goal.  Keep all follow-up visits. This is important.   Contact a health  care provider if:  You have new symptoms.  You have unexplained weight loss.  You have difficulty swallowing, or it hurts to swallow.  You have wheezing or a cough that does not go away.  Your symptoms do not improve with treatment.  You have frequent heartburn for more than two weeks. Get help right away if:  You have sudden severe pain in your arms, neck, jaw, teeth, or back.  You suddenly feel sweaty, dizzy, or light-headed.  You have chest pain or shortness of breath.  You vomit and the vomit is green, yellow, or black, or it looks like blood or coffee grounds.  Your stool is red, bloody, or black.  You have a fever.  You cannot swallow, drink, or eat. These symptoms may represent a serious problem that is an emergency. Do not wait to see if the symptoms will go away. Get medical help right away. Call your local emergency services (911 in the [place] Do not drive yourself to the hospital. Summary  Esophagitis is inflammation of the esophagus.  Most causes of esophagitis are not serious.  Follow your health care provider's instructions about eating and drinking.  Contact a health care provider if you have new symptoms, have  weight loss, or coughing that does not stop.  Get help right away if you have severe pain in the arms, neck, jaw, teeth, or back, or if you have chest pain, shortness of breath, or fever. This information is not intended to replace advice given to you by your health care provider. Make sure you discuss any questions you have with your health care provider. Document Revised: 07/26/2019 Document Reviewed: 07/26/2019 Elsevier Patient Education  [employer].
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
## 2020-06-30 ENCOUNTER — Emergency Department (HOSPITAL_COMMUNITY)
Admission: EM | Admit: 2020-06-30 | Discharge: 2020-07-01 | Disposition: A | Discharge status: Home / Self Care | Payer: Medicaid Other | Attending: Emergency Medicine | Admitting: Emergency Medicine

## 2020-06-30 ENCOUNTER — Other Ambulatory Visit: Payer: Self-pay

## 2020-06-30 ENCOUNTER — Encounter (HOSPITAL_COMMUNITY): Payer: Self-pay | Admitting: *Deleted

## 2020-06-30 ENCOUNTER — Emergency Department (HOSPITAL_COMMUNITY): Payer: Medicaid Other

## 2020-06-30 DIAGNOSIS — Z21 Asymptomatic human immunodeficiency virus [HIV] infection status: Secondary | ICD-10-CM | POA: Insufficient documentation

## 2020-06-30 DIAGNOSIS — R059 Cough, unspecified: Secondary | ICD-10-CM | POA: Diagnosis not present

## 2020-06-30 DIAGNOSIS — R6884 Jaw pain: Secondary | ICD-10-CM | POA: Insufficient documentation

## 2020-06-30 DIAGNOSIS — F17210 Nicotine dependence, cigarettes, uncomplicated: Secondary | ICD-10-CM | POA: Diagnosis not present

## 2020-06-30 LAB — CBC
Hematocrit: 31.5 % — ABNORMAL LOW (ref 34.0–46.6)
Hemoglobin: 10.4 g/dL — ABNORMAL LOW (ref 11.1–15.9)
MCH: 32.6 pg (ref 26.6–33.0)
MCHC: 33 g/dL (ref 31.5–35.7)
MCV: 99 fL — ABNORMAL HIGH (ref 79–97)
Platelets: 253 10*3/uL (ref 150–450)
RBC: 3.19 x10E6/uL — ABNORMAL LOW (ref 3.77–5.28)
RDW: 12.7 % (ref 11.7–15.4)
WBC: 6.5 10*3/uL (ref 3.4–10.8)

## 2020-06-30 MED ORDER — FENTANYL CITRATE (PF) 100 MCG/2ML IJ SOLN
50.0000 ug | Freq: Once | INTRAMUSCULAR | Status: AC
  Administered 2020-06-30: 50 ug via INTRAMUSCULAR
  Filled 2020-06-30: qty 2

## 2020-06-30 NOTE — ED Notes (Signed)
Orthostatic vitals taken at this time. Patient on 5 lead.
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
## 2020-07-02 NOTE — Progress Notes (Signed)
Internal Medicine Clinic Attending  Case discussed with Dr. Christian  At the time of the visit.  We reviewed the resident's history and exam and pertinent patient test results.  I agree with the assessment, diagnosis, and plan of care documented in the resident's note.  

## 2020-07-07 ENCOUNTER — Ambulatory Visit: Payer: Medicaid Other | Admitting: Infectious Diseases

## 2020-07-10 ENCOUNTER — Other Ambulatory Visit: Payer: Self-pay | Admitting: Family

## 2020-07-10 DIAGNOSIS — B20 Human immunodeficiency virus [HIV] disease: Secondary | ICD-10-CM

## 2020-07-11 ENCOUNTER — Other Ambulatory Visit (HOSPITAL_COMMUNITY): Payer: Self-pay

## 2020-07-11 ENCOUNTER — Encounter: Payer: Self-pay | Admitting: Infectious Diseases

## 2020-07-11 ENCOUNTER — Ambulatory Visit (INDEPENDENT_AMBULATORY_CARE_PROVIDER_SITE_OTHER): Payer: Medicaid Other | Admitting: Infectious Diseases

## 2020-07-11 ENCOUNTER — Other Ambulatory Visit: Payer: Self-pay

## 2020-07-11 ENCOUNTER — Other Ambulatory Visit: Payer: Self-pay | Admitting: Infectious Diseases

## 2020-07-11 VITALS — BP 106/74 | HR 88 | Resp 16 | Ht 65.0 in | Wt 187.0 lb

## 2020-07-11 DIAGNOSIS — S62101S Fracture of unspecified carpal bone, right wrist, sequela: Secondary | ICD-10-CM | POA: Insufficient documentation

## 2020-07-11 DIAGNOSIS — F17200 Nicotine dependence, unspecified, uncomplicated: Secondary | ICD-10-CM | POA: Diagnosis not present

## 2020-07-11 DIAGNOSIS — Z113 Encounter for screening for infections with a predominantly sexual mode of transmission: Secondary | ICD-10-CM

## 2020-07-11 DIAGNOSIS — B20 Human immunodeficiency virus [HIV] disease: Secondary | ICD-10-CM

## 2020-07-11 DIAGNOSIS — Z79899 Other long term (current) drug therapy: Secondary | ICD-10-CM

## 2020-07-11 DIAGNOSIS — K208 Other esophagitis without bleeding: Secondary | ICD-10-CM | POA: Diagnosis not present

## 2020-07-11 DIAGNOSIS — F250 Schizoaffective disorder, bipolar type: Secondary | ICD-10-CM | POA: Diagnosis not present

## 2020-07-11 MED ORDER — OXYCODONE-ACETAMINOPHEN 7.5-325 MG PO TABS
ORAL_TABLET | ORAL | 0 refills | Status: DC
  Filled 2020-07-11: qty 20, 5d supply, fill #0

## 2020-07-11 NOTE — Assessment & Plan Note (Signed)
Encouraged to quit. 
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
## 2020-07-11 NOTE — Assessment & Plan Note (Signed)
She has f/u with ortho this month

## 2020-07-11 NOTE — Assessment & Plan Note (Signed)
Will refer to dental Dental referral placed today for [facility]. Information to schedule appointment completed today.  Will continue her current rx Offered/refused condoms.  Labs today rtc in 6 months My appreciation to [facility].

## 2020-07-11 NOTE — Assessment & Plan Note (Signed)
She has f/u later this month Will recheck her CBC

## 2020-07-11 NOTE — Assessment & Plan Note (Signed)
Appreciate Mitch's help with getting her mental health f/u.

## 2020-07-12 ENCOUNTER — Encounter: Payer: Self-pay | Admitting: *Deleted

## 2020-07-12 LAB — T-HELPER CELL (CD4) - (RCID CLINIC ONLY)
CD4 % Helper T Cell: 47 % (ref 33–65)
CD4 T Cell Abs: 869 /uL (ref 400–1790)

## 2020-07-13 ENCOUNTER — Other Ambulatory Visit (HOSPITAL_COMMUNITY): Payer: Self-pay

## 2020-07-13 ENCOUNTER — Telehealth (INDEPENDENT_AMBULATORY_CARE_PROVIDER_SITE_OTHER): Payer: Medicaid Other | Admitting: Psychiatry

## 2020-07-13 ENCOUNTER — Other Ambulatory Visit: Payer: Self-pay

## 2020-07-13 ENCOUNTER — Encounter (HOSPITAL_COMMUNITY): Payer: Self-pay | Admitting: Psychiatry

## 2020-07-13 DIAGNOSIS — F250 Schizoaffective disorder, bipolar type: Secondary | ICD-10-CM | POA: Diagnosis not present

## 2020-07-13 DIAGNOSIS — F419 Anxiety disorder, unspecified: Secondary | ICD-10-CM

## 2020-07-13 MED ORDER — GABAPENTIN 400 MG PO CAPS
400.0000 mg | ORAL_CAPSULE | Freq: Three times a day (TID) | ORAL | 2 refills | Status: DC
  Filled 2020-07-13: qty 90, 30d supply, fill #0
  Filled 2020-07-13 – 2020-08-14 (×2): qty 90, 30d supply, fill #1

## 2020-07-13 MED ORDER — CITALOPRAM HYDROBROMIDE 20 MG PO TABS
ORAL_TABLET | Freq: Every day | ORAL | 2 refills | Status: DC
  Filled 2020-07-13: qty 30, 30d supply, fill #1
  Filled 2020-07-13: qty 30, 30d supply, fill #0

## 2020-07-13 MED ORDER — QUETIAPINE FUMARATE 300 MG PO TABS
300.0000 mg | ORAL_TABLET | Freq: Every day | ORAL | 2 refills | Status: DC
  Filled 2020-07-13: qty 30, 30d supply, fill #0
  Filled 2020-07-13 – 2020-08-14 (×2): qty 30, 30d supply, fill #1
  Filled 2020-09-11: qty 30, 30d supply, fill #2

## 2020-07-13 MED ORDER — ALPRAZOLAM 1 MG PO TABS
ORAL_TABLET | ORAL | 2 refills | Status: DC
  Filled 2020-07-13: qty 90, 30d supply, fill #0
  Filled 2020-07-13 – 2020-08-14 (×2): qty 90, 30d supply, fill #1
  Filled 2020-09-11: qty 90, 30d supply, fill #2
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
## 2020-07-15 ENCOUNTER — Other Ambulatory Visit (HOSPITAL_COMMUNITY): Payer: Self-pay

## 2020-07-19 ENCOUNTER — Other Ambulatory Visit (HOSPITAL_COMMUNITY): Payer: Self-pay

## 2020-07-19 MED FILL — Elvitegrav-Cobic-Emtricitab-Tenofov AF Tab 150-150-200-10 MG: ORAL | 90 days supply | Qty: 90 | Fill #1 | Status: CN

## 2020-07-20 MED FILL — Elvitegrav-Cobic-Emtricitab-Tenofov AF Tab 150-150-200-10 MG: ORAL | 90 days supply | Qty: 90 | Fill #1 | Status: AC

## 2020-07-26 ENCOUNTER — Encounter: Payer: Self-pay | Admitting: Gastroenterology

## 2020-07-26 ENCOUNTER — Ambulatory Visit (INDEPENDENT_AMBULATORY_CARE_PROVIDER_SITE_OTHER): Payer: Medicaid Other | Admitting: Gastroenterology

## 2020-07-26 ENCOUNTER — Other Ambulatory Visit (HOSPITAL_COMMUNITY): Payer: Self-pay

## 2020-07-26 VITALS — BP 120/80 | HR 85 | Ht 65.0 in | Wt 184.0 lb

## 2020-07-26 DIAGNOSIS — K208 Other esophagitis without bleeding: Secondary | ICD-10-CM | POA: Diagnosis not present

## 2020-07-26 MED ORDER — PANTOPRAZOLE SODIUM 40 MG PO TBEC
40.0000 mg | DELAYED_RELEASE_TABLET | Freq: Two times a day (BID) | ORAL | 5 refills | Status: DC
  Filled 2020-07-26: qty 60, 30d supply, fill #0
  Filled 2020-09-11: qty 60, 30d supply, fill #1

## 2020-07-26 NOTE — Patient Instructions (Signed)
If you are age 59 or older, your body mass index should be between 23-30. Your Body mass index is 30.62 kg/m. If this is out of the aforementioned range listed, please consider follow up with your Primary Care Provider.  If you are age 10 or younger, your body mass index should be between 19-25. Your Body mass index is 30.62 kg/m. If this is out of the aformentioned range listed, please consider follow up with your Primary Care Provider.   We have sent the following medications to your pharmacy for you to pick up at your convenience: Pantoprazole 40 mg   The [facility] providers would like to encourage you to use [facility] to communicate with providers for non-urgent requests or questions.  Due to long hold times on the telephone, sending your provider a message by [facility] may be a faster and more efficient way to get a response.  Please allow 48 business hours for a response.  Please remember that this is for non-urgent requests.   It was a pleasure to see you today!  Thank you for trusting me with your gastrointestinal care!     Alonza Bogus, PA-C

## 2020-07-26 NOTE — Progress Notes (Signed)
07/26/2020 Mackenzie Gonzalez [id number] 19-Mar-1960   HISTORY OF PRESENT ILLNESS:  This is a 60 year old female who is a patient of Dr. Tarri Glenn, known to her during her recent hospital stay.  During hospitalization in May she had some coffee-ground emesis and her hemoglobin was downtrending.  She was seen by our service.  EGD on 06/18/2020 showed LA grade D esophagitis.    FINAL MICROSCOPIC DIAGNOSIS:  A. ESOPHAGUS, BIOPSY:  - Inflamed squamous mucosa with exudate and granulation tissue  consistent with ulcer.  - No dysplasia or malignancy.   She was started on pantoprazole 40 mg twice daily and Carafate suspension 4 times a day.  She has completed the Carafate.  The plan was to keep her on twice daily PPI for at least 10 weeks.  She is here today for follow-up.  She is only on pantoprazole 40 mg once daily.  She says that overall she is feeling much better although she did have some nausea and vomiting last week.  No abdominal pain.  Her hemoglobin was improved on last recheck couple weeks ago.  She tells me that she had a colonoscopy in [place] about 7 years ago.  She says that there was some issue with it and they wanted to schedule her for another one, but she never proceeded.  She says that she is not in a hurry to have one done.  She has performed fecal occult blood tests that have been negative.  She has been using MiraLAX daily for her constipation and that has been helping with that issue.  Past Medical History:  Diagnosis Date   Anxiety    Bipolar 1 disorder ([place])    Chronic bronchitis ([place])    Hepatitis C carrier ([place])    HIV (human immunodeficiency virus infection) ([place]) 2015   HIV (human immunodeficiency virus infection) ([place])    Psychiatric disorder    reported h/o schizoaffective, bipolar, anxiety   Schizoaffective disorder ([place])    Past Surgical History:  Procedure Laterality Date   BIOPSY  06/18/2020   Procedure: BIOPSY;  Surgeon: [name], MD;  Location: [place];  Service: Gastroenterology;;   BREAST EXCISIONAL BIOPSY Left 1987   benign   ESOPHAGOGASTRODUODENOSCOPY (EGD) WITH PROPOFOL N/A 06/18/2020   Procedure: ESOPHAGOGASTRODUODENOSCOPY (EGD) WITH PROPOFOL;  Surgeon: [name], MD;  Location: [place];  Service: Gastroenterology;  Laterality: N/A;   I & D EXTREMITY Right 06/16/2020   Procedure: Open debridement of skin subcutaneous tissue and bone associated with open grade 2 fracture right hand;Radiographs 3 views right hand Complex wound closure degloving injury dorsal aspect of the hand greater than 10 cm. Open reduction and internal fixation right small finger metacarpal shaft ;  Surgeon: [name], MD;  Location: [place];  Service: Orthopedics;  Laterality: Right;   ORIF WRIST FRACTURE Left 06/16/2020   Procedure: Open reduction internal fixation displaced intra-articular distal radius fracture left wrist 3 more fragments; Radiographs 3 views left wrist Left wrist brachioadialis tendon release, tendon tenotomy;  Surgeon: [name], MD;  Location: [place];  Service: Orthopedics;  Laterality: Left;   SP ARTHRO WRIST*R*     WRIST SURGERY      reports that she has been smoking cigarettes. She has a 24.00 pack-year smoking history. She has never used smokeless tobacco. She reports previous alcohol use. She reports that she does not use drugs. family history includes Alcohol abuse in her father; Breast cancer in her paternal aunt; Hepatitis in her father; Psychiatric Illness  in her mother. Allergies  Allergen Reactions   Haldol [Haloperidol] Other (See Comments)    Makes jittery   Haloperidol    Haloperidol Lactate       Outpatient Encounter Medications as of 07/26/2020  Medication Sig   albuterol (VENTOLIN HFA) 108 (90 Base) MCG/ACT inhaler Inhale 2 puffs into the lungs every 6 (six) hours as needed for wheezing or shortness of breath.   ALPRAZolam (XANAX) 1 MG tablet TAKE 1 TABLET (1 MG TOTAL) BY MOUTH 3 (THREE) TIMES DAILY  AS NEEDED FOR ANXIETY.   bisacodyl (FLEET) 10 MG/30ML ENEM Place 10 mg rectally once.   calcium-vitamin D (OSCAL WITH D) 500-200 MG-UNIT tablet Take 1 tablet by mouth daily with breakfast.   citalopram (CELEXA) 20 MG tablet TAKE 1 TABLET (20 MG TOTAL) BY MOUTH DAILY.   diclofenac Sodium (VOLTAREN) 1 % GEL Apply 4 g topically 4 (four) times daily.   elvitegravir-cobicistat-emtricitabine-tenofovir (GENVOYA) 150-150-200-10 MG TABS tablet TAKE 1 TABLET BY MOUTH DAILY WITH BREAKFAST.   fluticasone (FLONASE) 50 MCG/ACT nasal spray Place 2 sprays into both nostrils daily as needed for allergies.   gabapentin (NEURONTIN) 400 MG capsule Take 1 capsule (400 mg total) by mouth 3 (three) times daily.   GENVOYA 150-150-200-10 MG TABS tablet Take 1 tablet by mouth every morning.   menthol-cetylpyridinium (CEPACOL) 3 MG lozenge Take 1 lozenge by mouth as needed for sore throat.   naproxen sodium (ALEVE) 220 MG tablet Take 220 mg by mouth.   oxyCODONE-acetaminophen (PERCOCET) 7.5-325 MG tablet Take 1 tablet by mouth every 6 hours for 5 days.   pantoprazole (PROTONIX) 40 MG tablet pantoprazole 40 mg tablet,delayed release   40 mg by oral route.   QUEtiapine (SEROQUEL) 300 MG tablet Take 1 tablet (300 mg total) by mouth at bedtime.   valACYclovir (VALTREX) 1000 MG tablet Take 1,000 mg by mouth See admin instructions. Take 1 tablet by mouth three times daily ONLY when having an active break out   sucralfate (CARAFATE) 1 GM/10ML suspension Take 10 mLs (1 g total) by mouth 4 (four) times daily -  with meals and at bedtime for 13 days.   No facility-administered encounter medications on file as of 07/26/2020.     REVIEW OF SYSTEMS  : All other systems reviewed and negative except where noted in the History of Present Illness.   PHYSICAL EXAM: BP 120/80   Pulse 85   Ht 5\' 5"  (1.651 m)   Wt 184 lb (83.5 kg)   BMI 30.62 kg/m  General: Well developed white female in no acute distress Head: Normocephalic and  atraumatic Eyes:  Sclerae anicteric, conjunctiva pink. Ears: Normal auditory acuity Lungs: Clear throughout to auscultation; no W/R/R. Heart: Regular rate and rhythm; no M/R/G. Abdomen: Soft, non-distended.  BS present.  Non-tender. Musculoskeletal: Symmetrical with no gross deformities  Skin: No lesions on visible extremities Extremities: No edema  Neurological: Alert oriented x 4, grossly non-focal Psychological:  Alert and cooperative. Normal mood and affect  ASSESSMENT AND PLAN: *LA grade D esophagitis seen on recent EGD 06/18/2020: Is feeling much better.  Completed her Carafate.  The plan was to continue pantoprazole 40 mg twice daily for 10 weeks.  She is only on once a day PPI.  She did have some nausea and vomiting last week.  We will increase PPI back to twice a day for now.  New prescription sent to pharmacy.  May want to consider repeat EGD to assess for healing and rule out underlying Barrett's when  she considers colonoscopy as below. *Colorectal cancer screening: She reports having a colonoscopy in [place] about 7 years ago.  She says that there was some issues with it and they wanted to schedule her for another one.  She was not sure if it was an incomplete prep or exactly what the issue was (not necessarily related to findings such as polyps from the sounds of it; sounds more like it was a poor prep or something).  She says that she is not in a hurry to have another one done.  She has had fecal occult blood tests performed that have been negative.  We will have her sign for records from her colonoscopy at [facility] in [place], [place].  Hopefully we can receive those results and review them to see exactly what the issue was previously and then can contact her about scheduling another colonoscopy.  CC:  Andrew Au, MD

## 2020-07-26 NOTE — Progress Notes (Signed)
Reviewed and agree with management plans. ? ?Zachrey Deutscher L. Broderic Bara, MD, MPH  ?

## 2020-08-01 ENCOUNTER — Encounter: Payer: Self-pay | Admitting: *Deleted

## 2020-08-01 ENCOUNTER — Other Ambulatory Visit (HOSPITAL_COMMUNITY): Payer: Self-pay

## 2020-08-02 ENCOUNTER — Other Ambulatory Visit (HOSPITAL_COMMUNITY): Payer: Self-pay

## 2020-08-02 MED ORDER — OXYCODONE-ACETAMINOPHEN 7.5-325 MG PO TABS
ORAL_TABLET | ORAL | 0 refills | Status: DC
  Filled 2020-08-02: qty 20, 5d supply, fill #0

## 2020-08-08 LAB — COMPREHENSIVE METABOLIC PANEL
AG Ratio: 1.1 (calc) (ref 1.0–2.5)
ALT: 11 U/L (ref 6–29)
AST: 21 U/L (ref 10–35)
Albumin: 3.7 g/dL (ref 3.6–5.1)
Alkaline phosphatase (APISO): 80 U/L (ref 37–153)
BUN: 11 mg/dL (ref 7–25)
CO2: 26 mmol/L (ref 20–32)
Calcium: 9.2 mg/dL (ref 8.6–10.4)
Chloride: 105 mmol/L (ref 98–110)
Creat: 0.93 mg/dL (ref 0.50–1.05)
Globulin: 3.3 g/dL (calc) (ref 1.9–3.7)
Glucose, Bld: 123 mg/dL — ABNORMAL HIGH (ref 65–99)
Potassium: 4.2 mmol/L (ref 3.5–5.3)
Sodium: 139 mmol/L (ref 135–146)
Total Bilirubin: 0.4 mg/dL (ref 0.2–1.2)
Total Protein: 7 g/dL (ref 6.1–8.1)

## 2020-08-08 LAB — CBC
HCT: 34.5 % — ABNORMAL LOW (ref 35.0–45.0)
Hemoglobin: 11.2 g/dL — ABNORMAL LOW (ref 11.7–15.5)
MCH: 32.3 pg (ref 27.0–33.0)
MCHC: 32.5 g/dL (ref 32.0–36.0)
MCV: 99.4 fL (ref 80.0–100.0)
MPV: 10.9 fL (ref 7.5–12.5)
Platelets: 248 10*3/uL (ref 140–400)
RBC: 3.47 10*6/uL — ABNORMAL LOW (ref 3.80–5.10)
RDW: 12.8 % (ref 11.0–15.0)
WBC: 4.8 10*3/uL (ref 3.8–10.8)

## 2020-08-08 LAB — HIV-1 RNA ULTRAQUANT REFLEX TO GENTYP+
HIV 1 RNA Quant: NOT DETECTED copies/mL
HIV-1 RNA Quant, Log: NOT DETECTED Log copies/mL

## 2020-08-08 LAB — RPR: RPR Ser Ql: NONREACTIVE

## 2020-08-09 ENCOUNTER — Other Ambulatory Visit (HOSPITAL_COMMUNITY): Payer: Self-pay

## 2020-08-14 ENCOUNTER — Other Ambulatory Visit: Payer: Self-pay

## 2020-08-14 ENCOUNTER — Other Ambulatory Visit (HOSPITAL_COMMUNITY): Payer: Self-pay

## 2020-08-14 ENCOUNTER — Emergency Department (HOSPITAL_BASED_OUTPATIENT_CLINIC_OR_DEPARTMENT_OTHER)
Admission: EM | Admit: 2020-08-14 | Discharge: 2020-08-14 | Disposition: A | Discharge status: Home / Self Care | Payer: Medicaid Other | Attending: Emergency Medicine | Admitting: Emergency Medicine

## 2020-08-14 ENCOUNTER — Encounter (HOSPITAL_BASED_OUTPATIENT_CLINIC_OR_DEPARTMENT_OTHER): Payer: Self-pay | Admitting: *Deleted

## 2020-08-14 ENCOUNTER — Emergency Department (HOSPITAL_BASED_OUTPATIENT_CLINIC_OR_DEPARTMENT_OTHER): Payer: Medicaid Other

## 2020-08-14 DIAGNOSIS — R109 Unspecified abdominal pain: Secondary | ICD-10-CM | POA: Diagnosis present

## 2020-08-14 DIAGNOSIS — R112 Nausea with vomiting, unspecified: Secondary | ICD-10-CM | POA: Diagnosis not present

## 2020-08-14 DIAGNOSIS — R1084 Generalized abdominal pain: Secondary | ICD-10-CM | POA: Diagnosis not present

## 2020-08-14 DIAGNOSIS — F17210 Nicotine dependence, cigarettes, uncomplicated: Secondary | ICD-10-CM | POA: Diagnosis not present

## 2020-08-14 DIAGNOSIS — Z21 Asymptomatic human immunodeficiency virus [HIV] infection status: Secondary | ICD-10-CM | POA: Insufficient documentation

## 2020-08-14 LAB — URINALYSIS, ROUTINE W REFLEX MICROSCOPIC
Bilirubin Urine: NEGATIVE
Glucose, UA: NEGATIVE mg/dL
Hgb urine dipstick: NEGATIVE
Ketones, ur: NEGATIVE mg/dL
Leukocytes,Ua: NEGATIVE
Nitrite: NEGATIVE
Protein, ur: NEGATIVE mg/dL
Specific Gravity, Urine: 1.01 (ref 1.005–1.030)
pH: 6.5 (ref 5.0–8.0)

## 2020-08-14 LAB — CBC WITH DIFFERENTIAL/PLATELET
Abs Immature Granulocytes: 0.01 10*3/uL (ref 0.00–0.07)
Basophils Absolute: 0 10*3/uL (ref 0.0–0.1)
Basophils Relative: 0 %
Eosinophils Absolute: 0.2 10*3/uL (ref 0.0–0.5)
Eosinophils Relative: 3 %
HCT: 39.4 % (ref 36.0–46.0)
Hemoglobin: 12.9 g/dL (ref 12.0–15.0)
Immature Granulocytes: 0 %
Lymphocytes Relative: 29 %
Lymphs Abs: 1.8 10*3/uL (ref 0.7–4.0)
MCH: 31.2 pg (ref 26.0–34.0)
MCHC: 32.7 g/dL (ref 30.0–36.0)
MCV: 95.4 fL (ref 80.0–100.0)
Monocytes Absolute: 0.5 10*3/uL (ref 0.1–1.0)
Monocytes Relative: 7 %
Neutro Abs: 3.7 10*3/uL (ref 1.7–7.7)
Neutrophils Relative %: 61 %
Platelets: 239 10*3/uL (ref 150–400)
RBC: 4.13 MIL/uL (ref 3.87–5.11)
RDW: 13.3 % (ref 11.5–15.5)
WBC: 6.2 10*3/uL (ref 4.0–10.5)
nRBC: 0 % (ref 0.0–0.2)

## 2020-08-14 LAB — COMPREHENSIVE METABOLIC PANEL
ALT: 11 U/L (ref 0–44)
AST: 16 U/L (ref 15–41)
Albumin: 4.2 g/dL (ref 3.5–5.0)
Alkaline Phosphatase: 67 U/L (ref 38–126)
Anion gap: 9 (ref 5–15)
BUN: 9 mg/dL (ref 6–20)
CO2: 23 mmol/L (ref 22–32)
Calcium: 9.1 mg/dL (ref 8.9–10.3)
Chloride: 107 mmol/L (ref 98–111)
Creatinine, Ser: 0.74 mg/dL (ref 0.44–1.00)
GFR, Estimated: >60 mL/min (ref >60)
Glucose, Bld: 112 mg/dL — ABNORMAL HIGH (ref 70–99)
Potassium: 4.3 mmol/L (ref 3.5–5.1)
Sodium: 139 mmol/L (ref 135–145)
Total Bilirubin: 0.4 mg/dL (ref 0.3–1.2)
Total Protein: 7.4 g/dL (ref 6.5–8.1)

## 2020-08-14 LAB — LIPASE, BLOOD: Lipase: 36 U/L (ref 11–51)

## 2020-08-14 MED ORDER — MORPHINE SULFATE (PF) 4 MG/ML IV SOLN
4.0000 mg | Freq: Once | INTRAVENOUS | Status: AC
  Administered 2020-08-14: 4 mg via INTRAVENOUS
  Filled 2020-08-14: qty 1

## 2020-08-14 MED ORDER — SODIUM CHLORIDE 0.9 % IV BOLUS
1000.0000 mL | Freq: Once | INTRAVENOUS | Status: AC
Start: 2020-08-14 — End: 2020-08-14
  Administered 2020-08-14: 1000 mL via INTRAVENOUS

## 2020-08-14 MED ORDER — DICYCLOMINE HCL 10 MG PO CAPS
10.0000 mg | ORAL_CAPSULE | Freq: Once | ORAL | Status: AC
  Administered 2020-08-14: 10 mg via ORAL
  Filled 2020-08-14: qty 1

## 2020-08-14 MED ORDER — ONDANSETRON HCL 4 MG/2ML IJ SOLN
4.0000 mg | Freq: Once | INTRAMUSCULAR | Status: AC
  Administered 2020-08-14: 4 mg via INTRAVENOUS
  Filled 2020-08-14: qty 2

## 2020-08-14 MED ORDER — OXYCODONE-ACETAMINOPHEN 7.5-325 MG PO TABS
ORAL_TABLET | ORAL | 0 refills | Status: DC
  Filled 2020-08-14: qty 20, 5d supply, fill #0

## 2020-08-14 MED ORDER — ONDANSETRON 4 MG PO TBDP
4.0000 mg | ORAL_TABLET | Freq: Three times a day (TID) | ORAL | 0 refills | Status: DC | PRN
  Filled 2020-08-14: qty 20, 7d supply, fill #0

## 2020-08-14 MED ORDER — DICYCLOMINE HCL 20 MG PO TABS
20.0000 mg | ORAL_TABLET | Freq: Two times a day (BID) | ORAL | 0 refills | Status: DC
  Filled 2020-08-14: qty 20, 10d supply, fill #0
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
## 2020-08-14 NOTE — ED Triage Notes (Signed)
Pt has been having left back pain coming around to lower abd for over a week. N/V. No urinary symptoms or bowel problems. Pain 8/10 denies nausea today.

## 2020-08-15 ENCOUNTER — Other Ambulatory Visit (HOSPITAL_COMMUNITY): Payer: Self-pay

## 2020-08-17 ENCOUNTER — Other Ambulatory Visit (HOSPITAL_COMMUNITY): Payer: Self-pay

## 2020-08-17 ENCOUNTER — Other Ambulatory Visit: Payer: Self-pay | Admitting: Family Medicine

## 2020-08-18 ENCOUNTER — Other Ambulatory Visit (HOSPITAL_COMMUNITY): Payer: Self-pay

## 2020-08-21 ENCOUNTER — Other Ambulatory Visit (HOSPITAL_COMMUNITY): Payer: Self-pay

## 2020-08-22 ENCOUNTER — Other Ambulatory Visit (HOSPITAL_COMMUNITY): Payer: Self-pay

## 2020-08-22 ENCOUNTER — Other Ambulatory Visit: Payer: Self-pay | Admitting: Family Medicine

## 2020-08-23 ENCOUNTER — Other Ambulatory Visit (HOSPITAL_COMMUNITY): Payer: Self-pay

## 2020-08-24 ENCOUNTER — Other Ambulatory Visit (HOSPITAL_COMMUNITY): Payer: Self-pay

## 2020-08-25 ENCOUNTER — Other Ambulatory Visit (HOSPITAL_COMMUNITY): Payer: Self-pay

## 2020-08-27 ENCOUNTER — Other Ambulatory Visit (HOSPITAL_COMMUNITY): Payer: Self-pay

## 2020-08-28 ENCOUNTER — Other Ambulatory Visit (HOSPITAL_COMMUNITY): Payer: Self-pay

## 2020-08-29 ENCOUNTER — Other Ambulatory Visit (HOSPITAL_COMMUNITY): Payer: Self-pay

## 2020-08-29 MED ORDER — OXYCODONE-ACETAMINOPHEN 7.5-325 MG PO TABS
ORAL_TABLET | ORAL | 0 refills | Status: DC
  Filled 2020-08-29: qty 20, 5d supply, fill #0

## 2020-08-30 ENCOUNTER — Other Ambulatory Visit: Payer: Self-pay | Admitting: Family Medicine

## 2020-08-30 ENCOUNTER — Other Ambulatory Visit (HOSPITAL_COMMUNITY): Payer: Self-pay

## 2020-08-31 ENCOUNTER — Other Ambulatory Visit (HOSPITAL_COMMUNITY): Payer: Self-pay

## 2020-09-01 ENCOUNTER — Other Ambulatory Visit: Payer: Self-pay | Admitting: Family Medicine

## 2020-09-01 ENCOUNTER — Other Ambulatory Visit (HOSPITAL_COMMUNITY): Payer: Self-pay

## 2020-09-04 ENCOUNTER — Other Ambulatory Visit (HOSPITAL_COMMUNITY): Payer: Self-pay

## 2020-09-04 ENCOUNTER — Other Ambulatory Visit: Payer: Self-pay | Admitting: Family Medicine

## 2020-09-05 ENCOUNTER — Other Ambulatory Visit (HOSPITAL_COMMUNITY): Payer: Self-pay

## 2020-09-06 ENCOUNTER — Other Ambulatory Visit (HOSPITAL_COMMUNITY): Payer: Self-pay

## 2020-09-06 ENCOUNTER — Other Ambulatory Visit: Payer: Self-pay | Admitting: Family Medicine

## 2020-09-08 ENCOUNTER — Other Ambulatory Visit (HOSPITAL_COMMUNITY): Payer: Self-pay

## 2020-09-11 ENCOUNTER — Encounter: Payer: Self-pay | Admitting: Gastroenterology

## 2020-09-11 ENCOUNTER — Ambulatory Visit: Payer: Medicaid Other | Admitting: Gastroenterology

## 2020-09-11 ENCOUNTER — Other Ambulatory Visit (HOSPITAL_COMMUNITY): Payer: Self-pay

## 2020-09-11 VITALS — BP 112/84 | HR 92 | Ht 65.0 in | Wt 185.8 lb

## 2020-09-11 DIAGNOSIS — K5900 Constipation, unspecified: Secondary | ICD-10-CM

## 2020-09-11 DIAGNOSIS — K2101 Gastro-esophageal reflux disease with esophagitis, with bleeding: Secondary | ICD-10-CM

## 2020-09-11 DIAGNOSIS — R1030 Lower abdominal pain, unspecified: Secondary | ICD-10-CM

## 2020-09-11 MED ORDER — POLYETHYLENE GLYCOL 3350 17 G PO PACK: 17.0000 g | PACK | Freq: Every day | ORAL | Status: DC

## 2020-09-11 MED ORDER — METAMUCIL 0.36 G PO CAPS: ORAL_CAPSULE | ORAL | Status: DC

## 2020-09-11 MED ORDER — OXYCODONE-ACETAMINOPHEN 7.5-325 MG PO TABS
ORAL_TABLET | ORAL | 0 refills | Status: DC
  Filled 2020-09-11: qty 20, 5d supply, fill #0

## 2020-09-11 MED ORDER — PANTOPRAZOLE SODIUM 40 MG PO TBEC
40.0000 mg | DELAYED_RELEASE_TABLET | Freq: Two times a day (BID) | ORAL | 11 refills | Status: DC
  Filled 2020-09-11: qty 60, 30d supply, fill #0

## 2020-09-11 MED ORDER — DICYCLOMINE HCL 20 MG PO TABS
20.0000 mg | ORAL_TABLET | Freq: Four times a day (QID) | ORAL | 2 refills | Status: DC | PRN
  Filled 2020-09-11: qty 30, 8d supply, fill #0

## 2020-09-11 MED ORDER — LINACLOTIDE 145 MCG PO CAPS
145.0000 ug | ORAL_CAPSULE | Freq: Every day | ORAL | 2 refills | Status: DC
  Filled 2020-09-11: qty 30, 30d supply, fill #0

## 2020-09-11 NOTE — Patient Instructions (Signed)
It was my pleasure to provide care to you today. Based on our discussion, I am providing you with my recommendations below:  RECOMMENDATION(S):   STOP TAKING PERCOCET Please dissolve 1 capful of Miralax in 8 ounces of water or juice daily. In addition, I would like for you to take this 2 times daily beginning 3 days BEFORE you start your prep Please take 1 dose of Metamucil or Benefiber daily Please consider a high fiber diet. I have included information within this After Visit Summary for you to refer back to  PRESCRIPTION MEDICATION(S):   We have sent the following medication(s) to your pharmacy:  Linzess Pantoprazole Dicyclomine  NOTE: If your medication(s) requires a PRIOR AUTHORIZATION, we will receive notification from your pharmacy. Once received, the process to submit for approval may take up to 7-10 business days. You will be contacted about any denials we have received from your insurance company as well as alternatives recommended by your provider.  COLONOSCOPY AND ENDOSCOPY:   You have been scheduled for an endoscopy and a colonoscopy. Please follow the written instructions given to you at your visit today.  PREP:   Please pick up your prep supplies at the pharmacy within the next 1-3 days.  INHALERS:   If you use inhalers (even only as needed), please bring them with you on the day of your procedure.  COLONOSCOPY TIPS:  To reduce nausea and dehydration, stay well hydrated for 3-4 days prior to the exam.  To prevent skin/hemorrhoid irritation - prior to wiping, put A&Dointment or vaseline on the toilet paper. Keep a towel or pad on the bed.  BEFORE STARTING YOUR PREP, drink  64oz of clear liquids in the morning. This will help to flush the colon and will ensure you are well hydrated!!!!  NOTE - This is in addition to the fluids required for to complete your prep. Use of a flavored hard candy, such as grape Anise Salvo, can counteract some of the flavor of the prep  and may prevent some nausea.   FOLLOW UP:  After your procedure, you will receive a call from my office staff regarding my recommendation for follow up.  BMI:  If you are age 4 or younger, your body mass index should be between 19-25. Your Body mass index is 30.92 kg/m. If this is out of the aformentioned range listed, please consider follow up with your Primary Care Provider.   MY CHART:  The [facility] providers would like to encourage you to use [facility] to communicate with providers for non-urgent requests or questions.  Due to long hold times on the telephone, sending your provider a message by [facility] may be a faster and more efficient way to get a response.  Please allow 48 business hours for a response.  Please remember that this is for non-urgent requests.   Thank you for trusting me with your gastrointestinal care!    [name], MD, MPH   High-Fiber Eating Plan Fiber, also called dietary fiber, is a type of carbohydrate. It is found foods such as fruits, vegetables, whole grains, and beans. A high-fiber diet can have many health benefits. Your health care provider may recommend a high-fiber diet to help: Prevent constipation. Fiber can make your bowel movements more regular. Lower your cholesterol. Relieve the following conditions: Inflammation of veins in the anus (hemorrhoids). Inflammation of specific areas of the digestive tract (uncomplicated diverticulosis). A problem of the large intestine, also called the colon, that sometimes causes pain and diarrhea (  irritable bowel syndrome, or IBS). Prevent overeating as part of a weight-loss plan. Prevent heart disease, type 2 diabetes, and certain cancers. What are tips for following this plan? Reading food labels  Check the nutrition facts label on food products for the amount of dietary fiber. Choose foods that have 5 grams of fiber or more per serving. The goals for recommended daily fiber intake include: Men (age  73 or younger): 34-38 g. Men (over age 62): 28-34 g. Women (age 38 or younger): 25-28 g. Women (over age 48): 22-25 g. Your daily fiber goal is _____________ g. Shopping Choose whole fruits and vegetables instead of processed forms, such as apple juice or applesauce. Choose a wide variety of high-fiber foods such as avocados, lentils, oats, and kidney beans. Read the nutrition facts label of the foods you choose. Be aware of foods with added fiber. These foods often have high sugar and sodium amounts per serving. Cooking Use whole-grain flour for baking and cooking. Cook with brown rice instead of white rice. Meal planning Start the day with a breakfast that is high in fiber, such as a cereal that contains 5 g of fiber or more per serving. Eat breads and cereals that are made with whole-grain flour instead of refined flour or white flour. Eat brown rice, bulgur wheat, or millet instead of white rice. Use beans in place of meat in soups, salads, and pasta dishes. Be sure that half of the grains you eat each day are whole grains. General information You can get the recommended daily intake of dietary fiber by: Eating a variety of fruits, vegetables, grains, nuts, and beans. Taking a fiber supplement if you are not able to take in enough fiber in your diet. It is better to get fiber through food than from a supplement. Gradually increase how much fiber you consume. If you increase your intake of dietary fiber too quickly, you may have bloating, cramping, or gas. Drink plenty of water to help you digest fiber. Choose high-fiber snacks, such as berries, raw vegetables, nuts, and popcorn. What foods should I eat? Fruits Berries. Pears. Apples. Oranges. Avocado. Prunes and raisins. Dried figs. Vegetables Sweet potatoes. Spinach. Kale. Artichokes. Cabbage. Broccoli. Cauliflower.Green peas. Carrots. Squash. Grains Whole-grain breads. Multigrain cereal. Oats and oatmeal. Brown rice.  Barley.Bulgur wheat. [place]. Quinoa. Bran muffins. Popcorn. Rye wafer crackers. Meats and other proteins Navy beans, kidney beans, and pinto beans. Soybeans. Split peas. Lentils. Nutsand seeds. Dairy Fiber-fortified yogurt. Beverages Fiber-fortified soy milk. Fiber-fortified orange juice. Other foods Fiber bars. The items listed above may not be a complete list of recommended foods and beverages. Contact a dietitian for more information. What foods should I avoid? Fruits Fruit juice. Cooked, strained fruit. Vegetables Fried potatoes. Canned vegetables. Well-cooked vegetables. Grains White bread. Pasta made with refined flour. White rice. Meats and other proteins Fatty cuts of meat. Fried chicken or fried fish. Dairy Milk. Yogurt. Cream cheese. Sour cream. Fats and oils Butters. Beverages Soft drinks. Other foods Cakes and pastries. The items listed above may not be a complete list of foods and beverages to avoid. Talk with your dietitian about what choices are best for you. Summary Fiber is a type of carbohydrate. It is found in foods such as fruits, vegetables, whole grains, and beans. A high-fiber diet has many benefits. It can help to prevent constipation, lower blood cholesterol, aid weight loss, and reduce your risk of heart disease, diabetes, and certain cancers. Increase your intake of fiber gradually. Increasing fiber too quickly  may cause cramping, bloating, and gas. Drink plenty of water while you increase the amount of fiber you consume. The best sources of fiber include whole fruits and vegetables, whole grains, nuts, seeds, and beans. This information is not intended to replace advice given to you by your health care provider. Make sure you discuss any questions you have with your healthcare provider. Document Revised: 05/20/2019 Document Reviewed: 05/20/2019 Elsevier Patient Education  2022 [employer].
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
## 2020-09-12 ENCOUNTER — Other Ambulatory Visit (HOSPITAL_COMMUNITY): Payer: Self-pay

## 2020-09-17 ENCOUNTER — Emergency Department (HOSPITAL_BASED_OUTPATIENT_CLINIC_OR_DEPARTMENT_OTHER): Payer: Medicaid Other

## 2020-09-17 ENCOUNTER — Inpatient Hospital Stay (HOSPITAL_BASED_OUTPATIENT_CLINIC_OR_DEPARTMENT_OTHER)
Admission: EM | Admit: 2020-09-17 | Discharge: 2020-09-21 | DRG: 065 | Disposition: A | Discharge status: Rehabilitation Facility | Payer: Medicaid Other | Attending: Internal Medicine | Admitting: Internal Medicine

## 2020-09-17 ENCOUNTER — Encounter (HOSPITAL_BASED_OUTPATIENT_CLINIC_OR_DEPARTMENT_OTHER): Payer: Self-pay | Admitting: Emergency Medicine

## 2020-09-17 ENCOUNTER — Other Ambulatory Visit: Payer: Self-pay

## 2020-09-17 DIAGNOSIS — K589 Irritable bowel syndrome without diarrhea: Secondary | ICD-10-CM | POA: Diagnosis present

## 2020-09-17 DIAGNOSIS — I959 Hypotension, unspecified: Secondary | ICD-10-CM | POA: Diagnosis not present

## 2020-09-17 DIAGNOSIS — B20 Human immunodeficiency virus [HIV] disease: Secondary | ICD-10-CM | POA: Diagnosis present

## 2020-09-17 DIAGNOSIS — Z7982 Long term (current) use of aspirin: Secondary | ICD-10-CM

## 2020-09-17 DIAGNOSIS — E669 Obesity, unspecified: Secondary | ICD-10-CM | POA: Diagnosis present

## 2020-09-17 DIAGNOSIS — R29707 NIHSS score 7: Secondary | ICD-10-CM | POA: Diagnosis present

## 2020-09-17 DIAGNOSIS — F17210 Nicotine dependence, cigarettes, uncomplicated: Secondary | ICD-10-CM | POA: Diagnosis present

## 2020-09-17 DIAGNOSIS — G8194 Hemiplegia, unspecified affecting left nondominant side: Secondary | ICD-10-CM | POA: Diagnosis not present

## 2020-09-17 DIAGNOSIS — E785 Hyperlipidemia, unspecified: Secondary | ICD-10-CM | POA: Diagnosis not present

## 2020-09-17 DIAGNOSIS — J410 Simple chronic bronchitis: Secondary | ICD-10-CM | POA: Diagnosis not present

## 2020-09-17 DIAGNOSIS — I10 Essential (primary) hypertension: Secondary | ICD-10-CM | POA: Diagnosis not present

## 2020-09-17 DIAGNOSIS — E119 Type 2 diabetes mellitus without complications: Secondary | ICD-10-CM | POA: Diagnosis not present

## 2020-09-17 DIAGNOSIS — Z803 Family history of malignant neoplasm of breast: Secondary | ICD-10-CM

## 2020-09-17 DIAGNOSIS — Z79899 Other long term (current) drug therapy: Secondary | ICD-10-CM

## 2020-09-17 DIAGNOSIS — F259 Schizoaffective disorder, unspecified: Secondary | ICD-10-CM | POA: Diagnosis present

## 2020-09-17 DIAGNOSIS — I639 Cerebral infarction, unspecified: Secondary | ICD-10-CM | POA: Diagnosis not present

## 2020-09-17 DIAGNOSIS — Z811 Family history of alcohol abuse and dependence: Secondary | ICD-10-CM

## 2020-09-17 DIAGNOSIS — R55 Syncope and collapse: Secondary | ICD-10-CM | POA: Diagnosis present

## 2020-09-17 DIAGNOSIS — L80 Vitiligo: Secondary | ICD-10-CM | POA: Diagnosis not present

## 2020-09-17 DIAGNOSIS — R4781 Slurred speech: Secondary | ICD-10-CM | POA: Diagnosis not present

## 2020-09-17 DIAGNOSIS — I6930 Unspecified sequelae of cerebral infarction: Secondary | ICD-10-CM

## 2020-09-17 DIAGNOSIS — F319 Bipolar disorder, unspecified: Secondary | ICD-10-CM | POA: Diagnosis not present

## 2020-09-17 DIAGNOSIS — R29810 Facial weakness: Secondary | ICD-10-CM | POA: Diagnosis present

## 2020-09-17 DIAGNOSIS — Z7902 Long term (current) use of antithrombotics/antiplatelets: Secondary | ICD-10-CM | POA: Diagnosis not present

## 2020-09-17 DIAGNOSIS — F411 Generalized anxiety disorder: Secondary | ICD-10-CM | POA: Diagnosis present

## 2020-09-17 DIAGNOSIS — Z20822 Contact with and (suspected) exposure to covid-19: Secondary | ICD-10-CM | POA: Diagnosis present

## 2020-09-17 DIAGNOSIS — I6302 Cerebral infarction due to thrombosis of basilar artery: Secondary | ICD-10-CM

## 2020-09-17 DIAGNOSIS — K219 Gastro-esophageal reflux disease without esophagitis: Secondary | ICD-10-CM | POA: Diagnosis present

## 2020-09-17 DIAGNOSIS — Z21 Asymptomatic human immunodeficiency virus [HIV] infection status: Secondary | ICD-10-CM | POA: Diagnosis present

## 2020-09-17 DIAGNOSIS — Z6831 Body mass index (BMI) 31.0-31.9, adult: Secondary | ICD-10-CM

## 2020-09-17 DIAGNOSIS — K208 Other esophagitis without bleeding: Secondary | ICD-10-CM | POA: Diagnosis present

## 2020-09-17 DIAGNOSIS — F419 Anxiety disorder, unspecified: Secondary | ICD-10-CM

## 2020-09-17 DIAGNOSIS — F4310 Post-traumatic stress disorder, unspecified: Secondary | ICD-10-CM | POA: Diagnosis present

## 2020-09-17 DIAGNOSIS — Z888 Allergy status to other drugs, medicaments and biological substances status: Secondary | ICD-10-CM

## 2020-09-17 DIAGNOSIS — Z720 Tobacco use: Secondary | ICD-10-CM | POA: Diagnosis present

## 2020-09-17 HISTORY — DX: Unspecified sequelae of cerebral infarction: I69.30

## 2020-09-17 LAB — BASIC METABOLIC PANEL
Anion gap: 8 (ref 5–15)
BUN: 9 mg/dL (ref 6–20)
CO2: 24 mmol/L (ref 22–32)
Calcium: 9.4 mg/dL (ref 8.9–10.3)
Chloride: 108 mmol/L (ref 98–111)
Creatinine, Ser: 0.71 mg/dL (ref 0.44–1.00)
GFR, Estimated: >60 mL/min (ref >60)
Glucose, Bld: 95 mg/dL (ref 70–99)
Potassium: 4.1 mmol/L (ref 3.5–5.1)
Sodium: 140 mmol/L (ref 135–145)

## 2020-09-17 LAB — CBC WITH DIFFERENTIAL/PLATELET
Abs Immature Granulocytes: 0.01 10*3/uL (ref 0.00–0.07)
Basophils Absolute: 0 10*3/uL (ref 0.0–0.1)
Basophils Relative: 0 %
Eosinophils Absolute: 0.1 10*3/uL (ref 0.0–0.5)
Eosinophils Relative: 2 %
HCT: 40.4 % (ref 36.0–46.0)
Hemoglobin: 13.4 g/dL (ref 12.0–15.0)
Immature Granulocytes: 0 %
Lymphocytes Relative: 32 %
Lymphs Abs: 1.9 10*3/uL (ref 0.7–4.0)
MCH: 30.8 pg (ref 26.0–34.0)
MCHC: 33.2 g/dL (ref 30.0–36.0)
MCV: 92.9 fL (ref 80.0–100.0)
Monocytes Absolute: 0.4 10*3/uL (ref 0.1–1.0)
Monocytes Relative: 7 %
Neutro Abs: 3.5 10*3/uL (ref 1.7–7.7)
Neutrophils Relative %: 59 %
Platelets: 204 10*3/uL (ref 150–400)
RBC: 4.35 MIL/uL (ref 3.87–5.11)
RDW: 14.2 % (ref 11.5–15.5)
WBC: 5.9 10*3/uL (ref 4.0–10.5)
nRBC: 0 % (ref 0.0–0.2)

## 2020-09-17 LAB — URINALYSIS, ROUTINE W REFLEX MICROSCOPIC
Bilirubin Urine: NEGATIVE
Glucose, UA: NEGATIVE mg/dL
Hgb urine dipstick: NEGATIVE
Ketones, ur: NEGATIVE mg/dL
Leukocytes,Ua: NEGATIVE
Nitrite: NEGATIVE
Protein, ur: NEGATIVE mg/dL
Specific Gravity, Urine: 1.032 — ABNORMAL HIGH (ref 1.005–1.030)
pH: 7 (ref 5.0–8.0)

## 2020-09-17 LAB — RESP PANEL BY RT-PCR (FLU A&B, COVID) ARPGX2
Influenza A by PCR: NEGATIVE
Influenza B by PCR: NEGATIVE
SARS Coronavirus 2 by RT PCR: NEGATIVE

## 2020-09-17 LAB — RAPID URINE DRUG SCREEN, HOSP PERFORMED
Amphetamines: NOT DETECTED
Barbiturates: NOT DETECTED
Benzodiazepines: POSITIVE — AB
Cocaine: NOT DETECTED
Opiates: NOT DETECTED
Tetrahydrocannabinol: POSITIVE — AB

## 2020-09-17 LAB — PROTIME-INR
INR: 0.9 (ref 0.8–1.2)
Prothrombin Time: 12.3 seconds (ref 11.4–15.2)

## 2020-09-17 MED ORDER — POLYETHYLENE GLYCOL 3350 17 G PO PACK: 17.0000 g | PACK | Freq: Every day | ORAL | Status: DC | PRN

## 2020-09-17 MED ORDER — ACETAMINOPHEN 325 MG PO TABS
650.0000 mg | ORAL_TABLET | Freq: Four times a day (QID) | ORAL | Status: DC | PRN
  Administered 2020-09-20: 650 mg via ORAL
  Filled 2020-09-17 (×2): qty 2

## 2020-09-17 MED ORDER — PANTOPRAZOLE SODIUM 40 MG PO TBEC: 40.0000 mg | DELAYED_RELEASE_TABLET | Freq: Two times a day (BID) | ORAL | Status: DC

## 2020-09-17 MED ORDER — FENTANYL CITRATE (PF) 100 MCG/2ML IJ SOLN
50.0000 ug | INTRAMUSCULAR | Status: DC | PRN
Start: 2020-09-17 — End: 2020-09-18
  Administered 2020-09-17 – 2020-09-18 (×5): 50 ug via INTRAVENOUS
  Filled 2020-09-17 (×6): qty 2

## 2020-09-17 MED ORDER — BISACODYL 5 MG PO TBEC: 5.0000 mg | DELAYED_RELEASE_TABLET | Freq: Every day | ORAL | Status: DC | PRN

## 2020-09-17 MED ORDER — ACETAMINOPHEN 650 MG RE SUPP: 650.0000 mg | Freq: Four times a day (QID) | RECTAL | Status: DC | PRN

## 2020-09-17 MED ORDER — ELVITEG-COBIC-EMTRICIT-TENOFAF 150-150-200-10 MG PO TABS
1.0000 | ORAL_TABLET | Freq: Every day | ORAL | Status: DC
  Administered 2020-09-18: 1 via ORAL
  Filled 2020-09-17 (×3): qty 1

## 2020-09-17 MED ORDER — FENTANYL CITRATE (PF) 100 MCG/2ML IJ SOLN: 25.0000 ug | INTRAMUSCULAR | Status: DC | PRN

## 2020-09-17 MED ORDER — LINACLOTIDE 145 MCG PO CAPS
145.0000 ug | ORAL_CAPSULE | Freq: Every day | ORAL | Status: DC
  Administered 2020-09-19 – 2020-09-21 (×3): 145 ug via ORAL
  Filled 2020-09-17 (×4): qty 1

## 2020-09-17 MED ORDER — NICOTINE 14 MG/24HR TD PT24: 14.0000 mg | MEDICATED_PATCH | Freq: Every day | TRANSDERMAL | Status: DC | PRN

## 2020-09-17 MED ORDER — PANTOPRAZOLE SODIUM 40 MG IV SOLR
40.0000 mg | Freq: Two times a day (BID) | INTRAVENOUS | Status: DC
  Administered 2020-09-17 – 2020-09-21 (×8): 40 mg via INTRAVENOUS
  Filled 2020-09-17 (×8): qty 40

## 2020-09-17 MED ORDER — LORAZEPAM 2 MG/ML IJ SOLN
1.0000 mg | Freq: Four times a day (QID) | INTRAMUSCULAR | Status: DC | PRN
  Administered 2020-09-18 (×2): 1 mg via INTRAVENOUS
  Filled 2020-09-17 (×2): qty 1

## 2020-09-17 MED ORDER — IOHEXOL 350 MG/ML SOLN
75.0000 mL | Freq: Once | INTRAVENOUS | Status: AC | PRN
  Administered 2020-09-17: 75 mL via INTRAVENOUS

## 2020-09-17 MED ORDER — ALPRAZOLAM 0.5 MG PO TABS
1.0000 mg | ORAL_TABLET | Freq: Once | ORAL | Status: AC
  Administered 2020-09-17: 1 mg via ORAL
  Filled 2020-09-17: qty 2

## 2020-09-17 MED ORDER — ALBUTEROL SULFATE (2.5 MG/3ML) 0.083% IN NEBU: 2.5000 mg | INHALATION_SOLUTION | Freq: Four times a day (QID) | RESPIRATORY_TRACT | Status: DC | PRN

## 2020-09-17 MED ORDER — CITALOPRAM HYDROBROMIDE 10 MG PO TABS
20.0000 mg | ORAL_TABLET | Freq: Every day | ORAL | Status: DC
  Administered 2020-09-18 – 2020-09-21 (×4): 20 mg via ORAL
  Filled 2020-09-17 (×4): qty 2

## 2020-09-17 MED ORDER — QUETIAPINE FUMARATE 200 MG PO TABS
300.0000 mg | ORAL_TABLET | Freq: Every day | ORAL | Status: DC
  Administered 2020-09-18 – 2020-09-20 (×3): 300 mg via ORAL
  Filled 2020-09-17 (×3): qty 1

## 2020-09-17 MED ORDER — STROKE: EARLY STAGES OF RECOVERY BOOK
Freq: Once | Status: AC
  Filled 2020-09-17: qty 1

## 2020-09-17 NOTE — ED Notes (Signed)
Pt void 300 cc clear yellow urine.

## 2020-09-17 NOTE — Consult Note (Signed)
NEUROLOGY CONSULTATION NOTE   Date of service: September 17, 2020 Patient Name: Mackenzie Gonzalez MRN:  [id number] DOB:  [birth year] Reason for consult: "L facial droop and L sided weakness" Requesting Provider: Rhetta Mura, DO _ _ _   _ __   _ __ _ _  __ __   _ __   __ _  History of Present Illness  Mackenzie Gonzalez is a 60 y.o. female with PMH significant for anxiety, bipolar, chronic bronchitis, Hep C, HIV, schizoaffective disorder, smoker who presents with diziness and left facial droop and weakness.  She had an episode of dizziness on thursday, felt like she was going to passout and then it went away. Dizziness returned on 09/16/20 at around 1400 and she felt weak inher left arm and left leg. She took a nap and overnight, symptoms were persistent. She woke up with a left facial droop this morning and slurred speech and her left arm and leg are still weak so she came to the ED.  No prior hx, no family hx of strokes, smoker. No hx of DM2, HTN, HLD.   mRS: 0 tPA/thrombectomy: outside window NIHSS components Score: Comment  1a Level of Conscious 0'[x]'$  1'[]'$  2'[]'$  3'[]'$      1b LOC Questions 0'[x]'$  1'[]'$  2'[]'$       1c LOC Commands 0'[x]'$  1'[]'$  2'[]'$       2 Best Gaze 0'[x]'$  1'[]'$  2'[]'$       3 Visual 0'[x]'$  1'[]'$  2'[]'$  3'[]'$      4 Facial Palsy 0'[]'$  1'[]'$  2'[x]'$  3'[]'$      5a Motor Arm - left 0'[]'$  1'[x]'$  2'[]'$  3'[]'$  4'[]'$  UN'[]'$    5b Motor Arm - Right 0'[x]'$  1'[]'$  2'[]'$  3'[]'$  4'[]'$  UN'[]'$    6a Motor Leg - Left 0'[]'$  1'[x]'$  2'[]'$  3'[]'$  4'[]'$  UN'[]'$    6b Motor Leg - Right 0'[x]'$  1'[]'$  2'[]'$  3'[]'$  4'[]'$  UN'[]'$    7 Limb Ataxia 0'[x]'$  1'[]'$  2'[]'$  3'[]'$  UN'[]'$     8 Sensory 0'[x]'$  1'[]'$  2'[]'$  UN'[]'$      9 Best Language 0'[x]'$  1'[]'$  2'[]'$  3'[]'$      10 Dysarthria 0'[]'$  1'[]'$  2'[x]'$  UN'[]'$      11 Extinct. and Inattention 0'[x]'$  1'[]'$  2'[]'$       TOTAL: 6     ROS   Constitutional Denies weight loss, fever and chills.   HEENT Denies changes in vision and hearing.   Respiratory Denies SOB and cough.   CV Denies palpitations and CP   GI Denies abdominal pain, nausea, vomiting and diarrhea.   GU Denies dysuria and urinary  frequency.   MSK Denies myalgia and joint pain.   Skin Denies rash and pruritus.   Neurological Denies headache and syncope.   Psychiatric Denies recent changes in mood. Denies anxiety and depression.    Past History   Past Medical History:  Diagnosis Date   Anxiety    Bipolar 1 disorder ([place])    Chronic bronchitis ([place])    Hepatitis C carrier ([place])    HIV (human immunodeficiency virus infection) ([place]) 2015   HIV (human immunodeficiency virus infection) ([place])    Psychiatric disorder    reported h/o schizoaffective, bipolar, anxiety   Schizoaffective disorder ([place])    Past Surgical History:  Procedure Laterality Date   BIOPSY  06/18/2020   Procedure: BIOPSY;  Surgeon: [name], MD;  Location: [place];  Service: Gastroenterology;;   BREAST EXCISIONAL BIOPSY Left 1987   benign   ESOPHAGOGASTRODUODENOSCOPY (EGD) WITH PROPOFOL N/A 06/18/2020   Procedure: ESOPHAGOGASTRODUODENOSCOPY (EGD) WITH PROPOFOL;  Surgeon: [name], MD;  Location: [facility] ENDOSCOPY;  Service: Gastroenterology;  Laterality: N/A;   I & D EXTREMITY Right 06/16/2020   Procedure: Open debridement of skin subcutaneous tissue and bone associated with open grade 2 fracture right hand;Radiographs 3 views right hand Complex wound closure degloving injury dorsal aspect of the hand greater than 10 cm. Open reduction and internal fixation right small finger metacarpal shaft ;  Surgeon: [name], MD;  Location: [place];  Service: Orthopedics;  Laterality: Right;   ORIF WRIST FRACTURE Left 06/16/2020   Procedure: Open reduction internal fixation displaced intra-articular distal radius fracture left wrist 3 more fragments; Radiographs 3 views left wrist Left wrist brachioadialis tendon release, tendon tenotomy;  Surgeon: [name], MD;  Location: [place];  Service: Orthopedics;  Laterality: Left;   SP ARTHRO WRIST*R*     WRIST SURGERY     Family History  Problem Relation Age of Onset   Psychiatric Illness  Mother    Hepatitis Father    Alcohol abuse Father    Breast cancer Paternal Aunt    Colon cancer Neg Hx    Stomach cancer Neg Hx    Social History   Socioeconomic History   Marital status: Single    Spouse name: Not on file   Number of children: Not on file   Years of education: Not on file   Highest education level: Not on file  Occupational History   Not on file  Tobacco Use   Smoking status: Every Day    Packs/day: 0.50    Years: 48.00    Pack years: 24.00    Types: Cigarettes   Smokeless tobacco: Never  Vaping Use   Vaping Use: Never used  Substance and Sexual Activity   Alcohol use: Not Currently   Drug use: Not Currently    Types: "Crack" cocaine, IV    Comment: former   Sexual activity: Not Currently    Partners: Female, Female  Other Topics Concern   Not on file  Social History Narrative   ** Merged History Encounter **       Social Determinants of Health   Financial Resource Strain: Not on file  Food Insecurity: Not on file  Transportation Needs: Not on file  Physical Activity: Not on file  Stress: Not on file  Social Connections: Not on file   Allergies  Allergen Reactions   Haldol [Haloperidol] Other (See Comments)    Makes jittery    Medications   Medications Prior to Admission  Medication Sig Dispense Refill Last Dose   linaclotide (LINZESS) 145 MCG CAPS capsule Take 1 capsule (145 mcg total) by mouth daily before breakfast. 30 capsule 2 8/20?   pantoprazole (PROTONIX) 40 MG tablet Take 1 tablet (40 mg total) by mouth 2 (two) times daily. 60 tablet 11 8/20? at am   QUEtiapine (SEROQUEL) 300 MG tablet Take 1 tablet (300 mg total) by mouth at bedtime. 30 tablet 2 8/19?   albuterol (VENTOLIN HFA) 108 (90 Base) MCG/ACT inhaler Inhale 2 puffs into the lungs every 6 (six) hours as needed for wheezing or shortness of breath. 18 g 6    ALPRAZolam (XANAX) 1 MG tablet TAKE 1 TABLET (1 MG TOTAL) BY MOUTH 3 (THREE) TIMES DAILY AS NEEDED FOR ANXIETY.  (Patient taking differently: Take 1 mg by mouth 3 (three) times daily as needed for anxiety.) 90 tablet 2    bisacodyl (DULCOLAX) 5 MG EC tablet Take 5 mg by mouth daily as needed for moderate constipation.  bisacodyl (FLEET) 10 MG/30ML ENEM Place 10 mg rectally as needed.      citalopram (CELEXA) 20 MG tablet TAKE 1 TABLET (20 MG TOTAL) BY MOUTH DAILY. (Patient taking differently: Take 20 mg by mouth daily.) 30 tablet 2    diclofenac Sodium (VOLTAREN) 1 % GEL Apply 4 g topically 4 (four) times daily.      dicyclomine (BENTYL) 20 MG tablet Take 1 tablet (20 mg total) by mouth 4 (four) times daily as needed for spasms. 30 tablet 2    elvitegravir-cobicistat-emtricitabine-tenofovir (GENVOYA) 150-150-200-10 MG TABS tablet TAKE 1 TABLET BY MOUTH DAILY WITH BREAKFAST. 90 tablet 3    fluticasone (FLONASE) 50 MCG/ACT nasal spray Place 2 sprays into both nostrils daily as needed for allergies.      gabapentin (NEURONTIN) 400 MG capsule Take 400 mg by mouth as needed.      naproxen sodium (ALEVE) 220 MG tablet Take 220 mg by mouth daily as needed.      ondansetron (ZOFRAN ODT) 4 MG disintegrating tablet Dissole 1 tablet (4 mg total) by mouth every 8 (eight) hours as needed for nausea or vomiting. 20 tablet 0    polyethylene glycol (MIRALAX / GLYCOLAX) 17 g packet Take 17 g by mouth daily. Dissolve 1 capful in 8 ounces of water or juice daily 14 each     Psyllium (METAMUCIL) 0.36 g CAPS Take 1 capsule by mouth daily      valACYclovir (VALTREX) 1000 MG tablet Take 1,000 mg by mouth See admin instructions. Take 1 tablet by mouth three times daily ONLY when having an active break out   Unknown     Vitals   Vitals:   09/17/20 1945 09/17/20 1946 09/17/20 1950 09/17/20 2134  BP: (!) 180/137  (!) 172/150 135/82  Pulse:      Resp: 14 (!) 24 (!) 34 17  Temp:   97.8 F (36.6 C) 98.7 F (37.1 C)  TempSrc:   Oral Axillary  SpO2:    97%  Weight:      Height:         Body mass index is 30.82  kg/m.  Physical Exam   General: Laying comfortably in bed; in no acute distress.  HENT: Normal oropharynx and mucosa. Normal external appearance of ears and nose.  Neck: Supple, no pain or tenderness CV: No JVD. No peripheral edema.  Pulmonary: Symmetric Chest rise. Normal respiratory effort.  Abdomen: Soft to touch, non-tender.  Ext: No cyanosis, edema, or deformity  Skin: No rash. Normal palpation of skin.   Musculoskeletal: Normal digits and nails by inspection. No clubbing.   Neurologic Examination  Mental status/Cognition: Alert, oriented to self, place, month and year, good attention.  Speech/language: dysarthric speech, non fluent, comprehension intact, object naming intact, repetition intact. Cranial nerves:   CN II Pupils equal and reactive to light, no VF deficits    CN III,IV,VI EOM intact, no gaze preference or deviation, no nystagmus    CN V normal sensation in V1, V2, and V3 segments bilaterally    CN VII L facial droop   CN VIII normal hearing to speech    CN IX & X normal palatal elevation, no uvular deviation    CN XI 5/5 head turn and 5/5 shoulder shrug bilaterally    CN XII midline tongue protrusion    Motor:  Muscle bulk: normal, tone normal, pronator drift LUE drponator drift. Mvmt Root Nerve  Muscle Right Left Comments  SA C5/6 Ax Deltoid  EF C5/6 Mc Biceps 5 4   EE C6/7/8 Rad Triceps 5 4   WF C6/7 Med FCR     WE C7/8 PIN ECU     F Ab C8/T1 U ADM/FDI 5 4   HF L1/2/3 Fem Illopsoas 5 4   KE L2/3/4 Fem Quad 5 4   DF L4/5 D Peron Tib Ant 5 4   PF S1/2 Tibial Grc/Sol 5 4    Reflexes:  Right Left Comments  Pectoralis      Biceps (C5/6) 2 2   Brachioradialis (C5/6) 2 2    Triceps (C6/7) 2 2    Patellar (L3/4) 2 2    Achilles (S1)      Hoffman      Plantar     Jaw jerk    Sensation:  Light touch intact   Pin prick    Temperature    Vibration   Proprioception    Coordination/Complex Motor:  - Finger to Nose no ataxia out of proportion to  weakness in LUE - Heel to shin unable to do - Rapid alternating movement are slowed in LUE - Gait: unsafe to assess given her weakness.  Labs   CBC:  Recent Labs  Lab 09/17/20 1054  WBC 5.9  NEUTROABS 3.5  HGB 13.4  HCT 40.4  MCV 92.9  PLT 0000000    Basic Metabolic Panel:  Lab Results  Component Value Date   NA 140 09/17/2020   K 4.1 09/17/2020   CO2 24 09/17/2020   GLUCOSE 95 09/17/2020   BUN 9 09/17/2020   CREATININE 0.71 09/17/2020   CALCIUM 9.4 09/17/2020   GFRNONAA >60 09/17/2020   GFRAA >60 07/30/2019   Lipid Panel:  Lab Results  Component Value Date   LDLCALC 121 (H) 07/26/2019   HgbA1c:  Lab Results  Component Value Date   HGBA1C 5.3 07/26/2019   Urine Drug Screen:     Component Value Date/Time   LABOPIA NONE DETECTED 09/17/2020 2040   COCAINSCRNUR NONE DETECTED 09/17/2020 2040   LABBENZ POSITIVE (A) 09/17/2020 2040   AMPHETMU NONE DETECTED 09/17/2020 2040   THCU POSITIVE (A) 09/17/2020 2040   LABBARB NONE DETECTED 09/17/2020 2040    Alcohol Level     Component Value Date/Time   ETH 60 (H) 06/16/2020 1542    CT Head without contrast: Personally reviewed and CTH was negative for a large hypodensity concerning for a large territory infarct or hyperdensity concerning for an ICH   CT angio Head and Neck with contrast: Personally reviewed and no LVO.  MRI Brain: Pending  Impression   Mackenzie Gonzalez is a 60 y.o. female with PMH significant for anxiety, bipolar, chronic bronchitis, Hep C, HIV, schizoaffective disorder, smoker who presents with diziness and left facial droop and weakness. Her neurologic examination is notable for L facial droop with dysarthric speech + LUE and LLE weakness.  Symptoms are concerning for a lacunar stroke.  Primary Diagnosis:  Other cerebral infarction due to occlusion of stenosis of small artery.   Recommendations  Plan:  Recommend that primary team order following: - Frequent Neuro checks per stroke unit  protocol - Recommend brain imaging with MRI Brain without contrast - Recommend obtaining TTE with bubble study - Recommend obtaining Lipid panel with LDL - Please start statin if LDL > 70 - Recommend HbA1c - Antithrombotic - aspirin '81mg'$  daily along with plavix '75mg'$  daily x 3 weeks ,followed by aspirin '81mg'$  daily alone. - Recommend DVT ppx - SBP goal -  permissive hypertension first 24 h < 220/110. Held home meds.  - Recommend Telemetry monitoring for arrythmia - Recommend bedside swallow screen prior to PO intake. - Stroke education booklet - Recommend PT/OT/SLP consult - Discussed with her that she should stop smoking cigarettes and marijuana.  ____________________________________________________________________   Thank you for the opportunity to take part in the care of this patient. If you have any further questions, please contact the neurology consultation attending.  Signed,  [place] Pager Number [phone] _ _ _   _ __   _ __ _ _  __ __   _ __   __ _

## 2020-09-17 NOTE — ED Triage Notes (Signed)
Pt reports feeling dizzy on Thursday, felt like she almost passed out. Yesterday pt also felt dizzy and then recovered. Today around 9am pt noted slur speech, left side face numb, presents with left facial droop.

## 2020-09-17 NOTE — ED Provider Notes (Signed)
[facility] EMERGENCY DEPT Provider Note   CSN: [id number] Arrival date & time: 09/17/20  1010     History Chief Complaint  Patient presents with   Near Syncope    Mackenzie Gonzalez is a 60 y.o. female.  HPI  60 year old female with past medical history of bipolar and schizoaffective disorder, HIV presents the emergency department with concern for dizziness and facial droop/speech difficulty.  Patient states 2 days ago on Thursday she had a dizziness episode that made her feel like she was going to pass out.  The dizziness resolved however she felt fatigued and not herself Thursday night and all day and a Friday.  Yesterday around 2 PM the dizziness returned and she was having left-sided upper and lower extremity weakness.  She decided to lay down early around 6 or 7 PM.  Overnight she states she was so dizzy and weak on the left that she was unable to get up or use the restroom.  When she woke up this morning at 9 AM her sister noticed that her face was drooping and that she was having slurred speech.  She denies any vision changes or aphasia.  She states the left upper and lower extremity now feel weak and slightly numb.  Past Medical History:  Diagnosis Date   Anxiety    Bipolar 1 disorder ([place])    Chronic bronchitis ([place])    Hepatitis C carrier ([place])    HIV (human immunodeficiency virus infection) ([place]) 2015   HIV (human immunodeficiency virus infection) ([place])    Psychiatric disorder    reported h/o schizoaffective, bipolar, anxiety   Schizoaffective disorder ([place])     Patient Active Problem List   Diagnosis Date Noted   Right wrist fracture, sequela 07/11/2020   Dysphagia 06/29/2020   Left wrist fracture, sequela 06/25/2020   Tobacco dependence 06/18/2020   Anemia due to GI blood loss    Esophagitis, [place] grade D    HIV (human immunodeficiency virus infection) ([place])    Psychiatric disorder    Hand injuries, unspecified laterality, sequela 06/16/2020    Herpes zoster 03/16/2020   Mixed stress and urge urinary incontinence 03/16/2020   Suspected orbital vs preseptal cellultiis 01/14/2020   Constipation 01/07/2020   Vasomotor symptoms due to menopause 12/15/2019   Pain in joint involving multiple sites 12/15/2019   Encounter for preventive care 12/15/2019   Simple chronic bronchitis ([place]) 09/02/2019   Anxiety disorder 07/12/2019   Chronic pain 05/25/2019   Hepatitis B immune 04/20/2019   Hepatitis A immune 04/20/2019   Chronic viral hepatitis C ([place]) 04/20/2019   HIV disease ([place]) 04/20/2019   Tobacco abuse 04/20/2019   Schizoaffective disorder, bipolar type ([place]) 04/20/2019    Past Surgical History:  Procedure Laterality Date   BIOPSY  06/18/2020   Procedure: BIOPSY;  Surgeon: [name], MD;  Location: [facility];  Service: Gastroenterology;;   BREAST EXCISIONAL BIOPSY Left 1987   benign   ESOPHAGOGASTRODUODENOSCOPY (EGD) WITH PROPOFOL N/A 06/18/2020   Procedure: ESOPHAGOGASTRODUODENOSCOPY (EGD) WITH PROPOFOL;  Surgeon: [name], MD;  Location: [facility];  Service: Gastroenterology;  Laterality: N/A;   I & D EXTREMITY Right 06/16/2020   Procedure: Open debridement of skin subcutaneous tissue and bone associated with open grade 2 fracture right hand;Radiographs 3 views right hand Complex wound closure degloving injury dorsal aspect of the hand greater than 10 cm. Open reduction and internal fixation right small finger metacarpal shaft ;  Surgeon: [name], MD;  Location: [facility];  Service: Orthopedics;  Laterality: Right;   ORIF WRIST FRACTURE Left 06/16/2020   Procedure: Open reduction internal fixation displaced intra-articular distal radius fracture left wrist 3 more fragments; Radiographs 3 views left wrist Left wrist brachioadialis tendon release, tendon tenotomy;  Surgeon: [name], MD;  Location: [facility];  Service: Orthopedics;  Laterality: Left;   SP ARTHRO WRIST*R*     WRIST SURGERY       OB  History   No obstetric history on file.     Family History  Problem Relation Age of Onset   Psychiatric Illness Mother    Hepatitis Father    Alcohol abuse Father    Breast cancer Paternal Aunt    Colon cancer Neg Hx    Stomach cancer Neg Hx     Social History   Tobacco Use   Smoking status: Every Day    Packs/day: 0.50    Years: 48.00    Pack years: 24.00    Types: Cigarettes   Smokeless tobacco: Never  Vaping Use   Vaping Use: Never used  Substance Use Topics   Alcohol use: Not Currently   Drug use: Not Currently    Types: "Crack" cocaine, IV    Comment: former    Home Medications Prior to Admission medications   Medication Sig Start Date End Date Taking? Authorizing Provider  ALPRAZolam (XANAX) 1 MG tablet TAKE 1 TABLET (1 MG TOTAL) BY MOUTH 3 (THREE) TIMES DAILY AS NEEDED FOR ANXIETY. 07/13/20 01/09/21 Yes Eulis Canner E, NP  citalopram (CELEXA) 20 MG tablet TAKE 1 TABLET (20 MG TOTAL) BY MOUTH DAILY. 07/13/20 07/13/21 Yes Eulis Canner E, NP  diclofenac Sodium (VOLTAREN) 1 % GEL Apply 4 g topically 4 (four) times daily. 04/14/20  Yes [provider]  dicyclomine (BENTYL) 20 MG tablet Take 1 tablet (20 mg total) by mouth 4 (four) times daily as needed for spasms. 09/11/20  Yes [name], MD  elvitegravir-cobicistat-emtricitabine-tenofovir (GENVOYA) 150-150-200-10 MG TABS tablet TAKE 1 TABLET BY MOUTH DAILY WITH BREAKFAST. 10/27/19 11/14/20 Yes Golden Circle, FNP  gabapentin (NEURONTIN) 400 MG capsule Take 400 mg by mouth as needed.   Yes [provider]  linaclotide Rolan Lipa) 145 MCG CAPS capsule Take 1 capsule (145 mcg total) by mouth daily before breakfast. 09/11/20  Yes [name], MD  pantoprazole (PROTONIX) 40 MG tablet Take 1 tablet (40 mg total) by mouth 2 (two) times daily. 09/11/20 09/06/21 Yes [name], MD  QUEtiapine (SEROQUEL) 300 MG tablet Take 1 tablet (300 mg total) by mouth at bedtime. 07/13/20  Yes Eulis Canner E, NP  albuterol (VENTOLIN HFA) 108 (90 Base) MCG/ACT inhaler Inhale 2 puffs into the lungs every 6 (six) hours as needed for wheezing or shortness of breath. 06/29/20   Mitzi Hansen, MD  bisacodyl (DULCOLAX) 5 MG EC tablet Take 5 mg by mouth daily as needed for moderate constipation.    [provider]  bisacodyl (FLEET) 10 MG/30ML ENEM Place 10 mg rectally as needed.    [provider]  fluticasone (FLONASE) 50 MCG/ACT nasal spray Place 2 sprays into both nostrils daily as needed for allergies. 01/30/20   [provider]  naproxen sodium (ALEVE) 220 MG tablet Take 220 mg by mouth daily as needed.    [provider]  ondansetron (ZOFRAN ODT) 4 MG disintegrating tablet Dissole 1 tablet (4 mg total) by mouth every 8 (eight) hours as needed for nausea or vomiting. 08/14/20   Isla Pence, MD  polyethylene glycol [facility] /  GLYCOLAX) 17 g packet Take 17 g by mouth daily. Dissolve 1 capful in 8 ounces of water or juice daily 09/11/20   [name], MD  Psyllium (METAMUCIL) 0.36 g CAPS Take 1 capsule by mouth daily 09/11/20   [name], MD  valACYclovir (VALTREX) 1000 MG tablet Take 1,000 mg by mouth See admin instructions. Take 1 tablet by mouth three times daily ONLY when having an active break out 03/15/20   [provider]    Allergies    Haldol [haloperidol]  Review of Systems   Review of Systems  Constitutional:  Negative for chills and fever.  HENT:  Negative for congestion.   Eyes:  Negative for visual disturbance.  Respiratory:  Negative for shortness of breath.   Cardiovascular:  Negative for chest pain.  Gastrointestinal:  Negative for abdominal pain, diarrhea and vomiting.  Genitourinary:  Negative for dysuria.  Skin:  Negative for rash.  Neurological:  Positive for dizziness, facial asymmetry, speech difficulty, weakness, light-headedness and numbness. Negative for headaches.   Physical Exam Updated Vital Signs BP  (!) 162/96   Pulse 65   Temp 97.8 F (36.6 C) (Oral)   Resp 17   SpO2 100%   Physical Exam Vitals and nursing note reviewed.  Constitutional:      Appearance: Normal appearance.  HENT:     Head: Normocephalic.     Mouth/Throat:     Mouth: Mucous membranes are moist.  Cardiovascular:     Rate and Rhythm: Normal rate.  Pulmonary:     Effort: Pulmonary effort is normal. No respiratory distress.  Abdominal:     Palpations: Abdomen is soft.     Tenderness: There is no abdominal tenderness.  Skin:    General: Skin is warm.  Neurological:     Mental Status: She is alert.     Comments: Left-sided facial droop, dysarthric speech, no aphasia, weakness in the left upper and lower extremity, + ataxia LUE  Psychiatric:        Mood and Affect: Mood normal.    ED Results / Procedures / Treatments   Labs (all labs ordered are listed, but only abnormal results are displayed) Labs Reviewed  RESP PANEL BY RT-PCR (FLU A&B, COVID) ARPGX2  CBC WITH DIFFERENTIAL/PLATELET  BASIC METABOLIC PANEL  PROTIME-INR    EKG None  Radiology No results found.  Procedures .Critical Care  Date/Time: 09/17/2020 2:26 PM Performed by: Lorelle Gibbs, DO Authorized by: Lorelle Gibbs, DO   Critical care provider statement:    Critical care time (minutes):  45   Critical care time was exclusive of:  Separately billable procedures and treating other patients   Critical care was necessary to treat or prevent imminent or life-threatening deterioration of the following conditions:  CNS failure or compromise   Critical care was time spent personally by me on the following activities:  Discussions with consultants, evaluation of patient's response to treatment, examination of patient, ordering and performing treatments and interventions, ordering and review of laboratory studies, ordering and review of radiographic studies, pulse oximetry, re-evaluation of patient's condition, obtaining history from  patient or surrogate and review of old charts   I assumed direction of critical care for this patient from another provider in my specialty: no     Care discussed with: admitting provider     Medications Ordered in ED Medications - No data to display  ED Course  I have reviewed the triage vital signs and the nursing notes.  Pertinent labs &  imaging results that were available during my care of the patient were reviewed by me and considered in my medical decision making (see chart for details).    MDM Rules/Calculators/A&P                           60 year old female presents the emergency department with concern for dizziness, facial droop and slurred speech.  Hypertensive on arrival, no headache.  NIH of 4 for facial droop, dysarthria, left-sided weakness and ataxia.  Symptoms started about 48 hours ago, out of the window for tPA or acute treatment.  Code stroke was not placed due to the timeframe.  Head CT is unremarkable, no bleed.  Consulted with Dr. Leonel Ramsay neurology who recommends CT angio of the head and neck to rule out basilar clot.  Pending this she will require admission to [facility]/medicine service.  CT angio of the head and neck shows no basilar abnormality.  Plan for medical admission and stroke evaluation/work-up.  Patient's neuro exam is unchanged.  Patients evaluation and results requires admission for further treatment and care. Patient agrees with admission plan, offers no new complaints and is stable/unchanged at time of admit.  Final Clinical Impression(s) / ED Diagnoses Final diagnoses:  None    Rx / DC Orders ED Discharge Orders     None        Lorelle Gibbs, DO 09/17/20 1426

## 2020-09-17 NOTE — Progress Notes (Signed)
Received a phone call from Facility: [facility]   Requesting MD: Dr. Dina Rich Patient with h/o HIV, hx of hep C, anxiety, schizoaffective disorder presenting with left facial droop. Symptoms started on Thursday and then yesterday had some left sided weakness and woke up today with left facial droop. CTH with no acute findings. CTA head and neck: no large vessel occlusion.  Labs wnl and vitals stable.   Discussed with Dr. Leonel Ramsay, neurology who recommended medicine [facility] for stroke w/u and MRI.   Plan of care: admit to medicine telemetry for stroke work up. Call neurology when patient arrives.   The patient will be accepted for admission to telemetry at [facility] when bed is available.  Nursing staff, Please call the [place] number at the top of Amion at the time of the patient's arrival so that the patient can be paged to the admitting physician.   Casimer Bilis, M.D. [facility]

## 2020-09-17 NOTE — ED Notes (Signed)
Report called to [facility] RN unit [address]

## 2020-09-17 NOTE — ED Notes (Signed)
pts sister Lillee Manheim [phone] called and updated.

## 2020-09-17 NOTE — ED Notes (Signed)
piv attempt x1 left anterior forearm unsuccessful

## 2020-09-17 NOTE — H&P (Signed)
History and Physical    PLEASE NOTE THAT DRAGON DICTATION SOFTWARE WAS USED IN THE CONSTRUCTION OF THIS NOTE.   [name] [id number] DOB: January 15, 1961 DOA: 09/17/2020  PCP: Mackenzie Clan, DO Patient coming from: home   I have personally briefly reviewed patient's old medical records in [place]  Chief Complaint: Left-sided weakness  HPI: Mackenzie Gonzalez is a 60 y.o. female with medical history significant for HIV, chronic bronchitis, GERD, chronic tobacco abuse, generalized anxiety disorder, bipolar disorder, who is admitted to [facility] on 09/17/2020 by way of transfer from [facility] ED for further evaluation and management of suspected acute ischemic CVA after presenting from home to the latter facility complaining of left-sided weakness.  The patient reports that on Thursday, 09/14/2020, that she developed mild dizziness lasting approximately 1 day before subsequent resolution without recurrence.  Denies any associated vertigo.  Following interval resolution of the dizziness, she subsequently developed sudden onset of left upper extremity and left lower extremity weakness at approximately 1400 on Saturday, 09/16/2020.  She notes that this was associated with numbness involving the left upper extremity.  Then, she woke at 9 AM this morning with new onset left facial droop as well as slurred speech, prompting her to present to [facility] ED for further evaluation of such.  She reports that the left-sided weakness is demonstrated no significant interval improvement since onset, and notes no significant change in her left upper extremity numbness.  She denies any acute weakness involving her right side, continues to deny any associated vertigo.  Also denies any associated acute change in vision.  No prior history of symptoms similar to the above.  Not associate with any recent headache.  Denies any recent trauma.  She also denies any associated chest pain, shortness of breath,  palpitations, diaphoresis, syncope.   Denies any previous history of stroke. In terms of modifiable risk factors, she acknowledges that she is a current smoker, having smoked approximately half pack per day over the last 25 years.  Denies any known underlying history of hypertension, hyperlipidemia, diabetes, atrial fibrillation, or obstructive sleep apnea.  She confirms that she is on no blood thinners as an outpatient, including aspirin.  She also confirms that she is not currently on any statin medications at home.  She denies any recent subjective fever, chills, rigors, generalized myalgias.  No recent dysuria, gross hematuria, change in urinary urgency/frequency.  No recent known COVID-19 exposures.  Per chart review, medical history also notable for HIV, with most recent CD4 count in June 22 noted to be 869, while most recent HIV RNA viral load to be 28 in November 2021.  There is also documentation of a prior history of intravenous cocaine use, although the patient denies any recent recreational drug use of any kind.  Also acknowledges a history of vitiligo.     [facility] ED Course:  Vital signs in the ED were notable for the following: Temperature max 98.6, heart rate 60-79, blood pressure 117/86 -162/96; respiratory rate 16-20, oxygen saturation 98 to 100% on room air.  Labs were notable for the following: BMP notable for sodium 140, creatinine 0.71, glucose 95.  CBC notable for hemoglobin 13.4 platelet count 204.  INR 0.9.  Screening COVID-19 PCR was performed in the ED today and found to be negative.  EKG shows sinus arrhythmia with heart rate 66, normal intervals, nonspecific T wave inversion in V1, which appears unchanged from most recent prior EKG in May 2022, and shows no  evidence of ST changes, including no evidence of ST elevation.  Noncontrast CT head showed no evidence of acute intracranial process.  CT head and neck showed no evidence of large vessel occlusion, high-grade  stenosis, or evidence of dissection.  The EDP discussed the patient's case with the on-call neurologist, Dr. Leonel Ramsay, recommended that the patient be admitted to [facility] for additional stroke work-up, including pursuit of MRI brain as well as additional assessment for potential modifiable acute ischemic CVA risk factors.  While in the ED, the following were administered: Xanax 1 mg p.o. x1.     Review of Systems: As per HPI otherwise 10 point review of systems negative.   Past Medical History:  Diagnosis Date   Anxiety    Bipolar 1 disorder ([place])    Chronic bronchitis ([place])    Hepatitis C carrier ([place])    HIV (human immunodeficiency virus infection) ([place]) 2015   HIV (human immunodeficiency virus infection) ([place])    Psychiatric disorder    reported h/o schizoaffective, bipolar, anxiety   Schizoaffective disorder ([place])     Past Surgical History:  Procedure Laterality Date   BIOPSY  06/18/2020   Procedure: BIOPSY;  Surgeon: [name], MD;  Location: [place];  Service: Gastroenterology;;   BREAST EXCISIONAL BIOPSY Left 1987   benign   ESOPHAGOGASTRODUODENOSCOPY (EGD) WITH PROPOFOL N/A 06/18/2020   Procedure: ESOPHAGOGASTRODUODENOSCOPY (EGD) WITH PROPOFOL;  Surgeon: [name], MD;  Location: [place];  Service: Gastroenterology;  Laterality: N/A;   I & D EXTREMITY Right 06/16/2020   Procedure: Open debridement of skin subcutaneous tissue and bone associated with open grade 2 fracture right hand;Radiographs 3 views right hand Complex wound closure degloving injury dorsal aspect of the hand greater than 10 cm. Open reduction and internal fixation right small finger metacarpal shaft ;  Surgeon: [name], MD;  Location: [place];  Service: Orthopedics;  Laterality: Right;   ORIF WRIST FRACTURE Left 06/16/2020   Procedure: Open reduction internal fixation displaced intra-articular distal radius fracture left wrist 3 more fragments; Radiographs 3 views left wrist  Left wrist brachioadialis tendon release, tendon tenotomy;  Surgeon: [name], MD;  Location: [place];  Service: Orthopedics;  Laterality: Left;   SP ARTHRO WRIST*R*     WRIST SURGERY      Social History:  reports that she has been smoking cigarettes. She has a 24.00 pack-year smoking history. She has never used smokeless tobacco. She reports that she does not currently use alcohol. She reports that she does not currently use drugs after having used the following drugs: "Crack" cocaine and IV.   Allergies  Allergen Reactions   Haldol [Haloperidol] Other (See Comments)    Makes jittery    Family History  Problem Relation Age of Onset   Psychiatric Illness Mother    Hepatitis Father    Alcohol abuse Father    Breast cancer Paternal Aunt    Colon cancer Neg Hx    Stomach cancer Neg Hx      Prior to Admission medications   Medication Sig Start Date End Date Taking? Authorizing Provider  linaclotide Rolan Lipa) 145 MCG CAPS capsule Take 1 capsule (145 mcg total) by mouth daily before breakfast. 09/11/20  Yes [name], MD  pantoprazole (PROTONIX) 40 MG tablet Take 1 tablet (40 mg total) by mouth 2 (two) times daily. 09/11/20 09/06/21 Yes [name], MD  QUEtiapine (SEROQUEL) 300 MG tablet Take 1 tablet (300 mg total) by mouth at bedtime. 07/13/20  Yes Salley Slaughter,  NP  albuterol (VENTOLIN HFA) 108 (90 Base) MCG/ACT inhaler Inhale 2 puffs into the lungs every 6 (six) hours as needed for wheezing or shortness of breath. 06/29/20   Mitzi Hansen, MD  ALPRAZolam (XANAX) 1 MG tablet TAKE 1 TABLET (1 MG TOTAL) BY MOUTH 3 (THREE) TIMES DAILY AS NEEDED FOR ANXIETY. Patient taking differently: Take 1 mg by mouth 3 (three) times daily as needed for anxiety. 07/13/20 01/09/21  Salley Slaughter, NP  bisacodyl (DULCOLAX) 5 MG EC tablet Take 5 mg by mouth daily as needed for moderate constipation.    [provider]  bisacodyl (FLEET) 10 MG/30ML ENEM Place 10 mg  rectally as needed.    [provider]  citalopram (CELEXA) 20 MG tablet TAKE 1 TABLET (20 MG TOTAL) BY MOUTH DAILY. Patient taking differently: Take 20 mg by mouth daily. 07/13/20 07/13/21  Salley Slaughter, NP  diclofenac Sodium (VOLTAREN) 1 % GEL Apply 4 g topically 4 (four) times daily. 04/14/20   [provider]  dicyclomine (BENTYL) 20 MG tablet Take 1 tablet (20 mg total) by mouth 4 (four) times daily as needed for spasms. 09/11/20   [name], MD  elvitegravir-cobicistat-emtricitabine-tenofovir (GENVOYA) 150-150-200-10 MG TABS tablet TAKE 1 TABLET BY MOUTH DAILY WITH BREAKFAST. 10/27/19 11/14/20  Golden Circle, FNP  fluticasone (FLONASE) 50 MCG/ACT nasal spray Place 2 sprays into both nostrils daily as needed for allergies. 01/30/20   [provider]  gabapentin (NEURONTIN) 400 MG capsule Take 400 mg by mouth as needed.    [provider]  naproxen sodium (ALEVE) 220 MG tablet Take 220 mg by mouth daily as needed.    [provider]  ondansetron (ZOFRAN ODT) 4 MG disintegrating tablet Dissole 1 tablet (4 mg total) by mouth every 8 (eight) hours as needed for nausea or vomiting. 08/14/20   Isla Pence, MD  polyethylene glycol (MIRALAX / GLYCOLAX) 17 g packet Take 17 g by mouth daily. Dissolve 1 capful in 8 ounces of water or juice daily 09/11/20   [name], MD  Psyllium (METAMUCIL) 0.36 g CAPS Take 1 capsule by mouth daily 09/11/20   [name], MD  valACYclovir (VALTREX) 1000 MG tablet Take 1,000 mg by mouth See admin instructions. Take 1 tablet by mouth three times daily ONLY when having an active break out 03/15/20   [provider]     Objective    Physical Exam: Vitals:   09/17/20 1019 09/17/20 1136 09/17/20 1340 09/17/20 1716  BP: (!) 162/96 (!) 156/133 117/86 (!) 162/99  Pulse: 65 60 79 70  Resp: '17 20 11 16  '$ Temp: 97.8 F (36.6 C)   98.6 F (37 C)  TempSrc: Oral   Oral  SpO2: 100% 100% 98% 99%   Weight:    84 kg  Height:    '5\' 5"'$  (1.651 m)    General: appears to be stated age; alert, oriented Skin: warm, dry, diffuse patchy hypopigmentation noted Head:  AT/[place] Mouth:  Oral mucosa membranes appear moist, normal dentition Neck: supple; trachea midline Heart:  RRR; did not appreciate any M/R/G Lungs: CTAB, did not appreciate any wheezes, rales, or rhonchi Abdomen: + BS; soft, ND, NT Vascular: 2+ pedal pulses b/l; 2+ radial pulses b/l Extremities: no peripheral edema, no muscle wasting Neuro: 4/5 strength of the proximal and distal flexors and extensors of the upper and lower extremities on the left; 5/5 strength in the upper/lower extremities on the right; diminished sensation to light touch in the LUE, otherwise  sensation appears intact.left pronator drift; slurred speech noted, left facial droop present. Finger to nose impaired on left. CN2-C12 grossly intact.     Labs on Admission: I have personally reviewed following labs and imaging studies  CBC: Recent Labs  Lab 09/17/20 1054  WBC 5.9  NEUTROABS 3.5  HGB 13.4  HCT 40.4  MCV 92.9  PLT 0000000   Basic Metabolic Panel: Recent Labs  Lab 09/17/20 1054  NA 140  K 4.1  CL 108  CO2 24  GLUCOSE 95  BUN 9  CREATININE 0.71  CALCIUM 9.4   GFR: Estimated Creatinine Clearance: 81 mL/min (by C-G formula based on SCr of 0.71 mg/dL). Liver Function Tests: No results for input(s): AST, ALT, ALKPHOS, BILITOT, PROT, ALBUMIN in the last 168 hours. No results for input(s): LIPASE, AMYLASE in the last 168 hours. No results for input(s): AMMONIA in the last 168 hours. Coagulation Profile: Recent Labs  Lab 09/17/20 1054  INR 0.9   Cardiac Enzymes: No results for input(s): CKTOTAL, CKMB, CKMBINDEX, TROPONINI in the last 168 hours. BNP (last 3 results) No results for input(s): PROBNP in the last 8760 hours. HbA1C: No results for input(s): HGBA1C in the last 72 hours. CBG: No results for input(s): GLUCAP in the last 168  hours. Lipid Profile: No results for input(s): CHOL, HDL, LDLCALC, TRIG, CHOLHDL, LDLDIRECT in the last 72 hours. Thyroid Function Tests: No results for input(s): TSH, T4TOTAL, FREET4, T3FREE, THYROIDAB in the last 72 hours. Anemia Panel: No results for input(s): VITAMINB12, FOLATE, FERRITIN, TIBC, IRON, RETICCTPCT in the last 72 hours. Urine analysis:    Component Value Date/Time   COLORURINE YELLOW 08/14/2020 0945   APPEARANCEUR CLEAR 08/14/2020 0945   LABSPEC 1.010 08/14/2020 0945   PHURINE 6.5 08/14/2020 0945   GLUCOSEU NEGATIVE 08/14/2020 0945   HGBUR NEGATIVE 08/14/2020 0945   BILIRUBINUR NEGATIVE 08/14/2020 0945   KETONESUR NEGATIVE 08/14/2020 0945   PROTEINUR NEGATIVE 08/14/2020 0945   NITRITE NEGATIVE 08/14/2020 0945   LEUKOCYTESUR NEGATIVE 08/14/2020 0945    Radiological Exams on Admission: CT Angio Head W or Wo Contrast  Result Date: 09/17/2020 CLINICAL DATA:  Neuro deficit, acute, stroke suspected EXAM: CT ANGIOGRAPHY HEAD AND NECK TECHNIQUE: Multidetector CT imaging of the head and neck was performed using the standard protocol during bolus administration of intravenous contrast. Multiplanar CT image reconstructions and MIPs were obtained to evaluate the vascular anatomy. Carotid stenosis measurements (when applicable) are obtained utilizing NASCET criteria, using the distal internal carotid diameter as the denominator. CONTRAST:  47m OMNIPAQUE IOHEXOL 350 MG/ML SOLN COMPARISON:  None. FINDINGS: CTA NECK Aortic arch: Great vessel origins are patent. Right carotid system: Patent. Minimal calcified plaque along the proximal internal carotid. No stenosis. Left carotid system: Patent. Minimal calcified plaque along the proximal internal carotid. No stenosis. Vertebral arteries: Patent. Right vertebral artery is dominant. No stenosis. Skeleton: Mild cervical spine degenerative changes, greatest at C5-C6. Other neck: Unremarkable. Upper chest: No apical lung mass. Review of the MIP  images confirms the above findings CTA HEAD Anterior circulation: Intracranial internal carotid arteries are patent. Anterior and middle cerebral arteries are patent. Posterior circulation: Intracranial vertebral arteries are patent. Basilar artery is patent. Major cerebellar artery origins are patent. Bilateral posterior communicating arteries are present. Venous sinuses: Patent as allowed by contrast bolus timing. Review of the MIP images confirms the above findings IMPRESSION: No large vessel occlusion, hemodynamically significant stenosis, or evidence of dissection. Electronically Signed   By: PMacy MisM.D.   On: 09/17/2020 13:02  CT Head Wo Contrast  Result Date: 09/17/2020 CLINICAL DATA:  Pt reports feeling dizzy on Thursday, felt like she almost passed out. Yesterday pt also felt dizzy and then recovered. Today around 9am pt noted slur speech, left side face numb, presents with left facial droop. EXAM: CT HEAD WITHOUT CONTRAST TECHNIQUE: Contiguous axial images were obtained from the base of the skull through the vertex without intravenous contrast. COMPARISON:  06/16/2020. FINDINGS: Brain: No evidence of acute infarction, hemorrhage, hydrocephalus, extra-axial collection or mass lesion/mass effect. Patchy areas of white matter hypoattenuation are noted consistent with mild chronic microvascular ischemic change. Vascular: No hyperdense vessel or unexpected calcification. Skull: Normal. Negative for fracture or focal lesion. Sinuses/Orbits: Globes and orbits are unremarkable. Sinuses are clear. Other: None. IMPRESSION: 1. No acute intracranial abnormalities. 2. Mild chronic microvascular ischemic change. Electronically Signed   By: Lajean Manes M.D.   On: 09/17/2020 11:37   CT Angio Neck W and/or Wo Contrast  Result Date: 09/17/2020 CLINICAL DATA:  Neuro deficit, acute, stroke suspected EXAM: CT ANGIOGRAPHY HEAD AND NECK TECHNIQUE: Multidetector CT imaging of the head and neck was performed  using the standard protocol during bolus administration of intravenous contrast. Multiplanar CT image reconstructions and MIPs were obtained to evaluate the vascular anatomy. Carotid stenosis measurements (when applicable) are obtained utilizing NASCET criteria, using the distal internal carotid diameter as the denominator. CONTRAST:  3m OMNIPAQUE IOHEXOL 350 MG/ML SOLN COMPARISON:  None. FINDINGS: CTA NECK Aortic arch: Great vessel origins are patent. Right carotid system: Patent. Minimal calcified plaque along the proximal internal carotid. No stenosis. Left carotid system: Patent. Minimal calcified plaque along the proximal internal carotid. No stenosis. Vertebral arteries: Patent. Right vertebral artery is dominant. No stenosis. Skeleton: Mild cervical spine degenerative changes, greatest at C5-C6. Other neck: Unremarkable. Upper chest: No apical lung mass. Review of the MIP images confirms the above findings CTA HEAD Anterior circulation: Intracranial internal carotid arteries are patent. Anterior and middle cerebral arteries are patent. Posterior circulation: Intracranial vertebral arteries are patent. Basilar artery is patent. Major cerebellar artery origins are patent. Bilateral posterior communicating arteries are present. Venous sinuses: Patent as allowed by contrast bolus timing. Review of the MIP images confirms the above findings IMPRESSION: No large vessel occlusion, hemodynamically significant stenosis, or evidence of dissection. Electronically Signed   By: PMacy MisM.D.   On: 09/17/2020 13:02     EKG: Independently reviewed, with result as described above.    Assessment/Plan   BLANAJAH CATULLOis a 60y.o. female with medical history significant for HIV, chronic bronchitis, GERD, chronic tobacco abuse, generalized anxiety disorder, bipolar disorder, [facility] suspected  acute ischemic CVA after presenting from home to the latter facility complaining of left-sided weakness.   Principal Problem:   Facial droop Active Problems:   HIV disease (HCC)   Tobacco abuse   Anxiety disorder   Simple chronic bronchitis ([facility])   Left hemiparesis ([facility])   GERD (gastroesophageal reflux disease)   Slurred speech     #) Left hemiparesis/left facial droop: Concerning for acute ischemic CVA in the setting of sudden onset left hemiparesis associated with left upper extremity numbness at approximately 1400 on 09/16/2020, with ensuing development of left facial droop with slurred speech upon waking at 9 AM today, with persistence of all the symptoms, without significant interval improvement since onset.  CT head showed no evidence of  acute intracranial process, while CTA head and neck show no evidence of large vessel occlusion, high-grade stenosis, or evidence of dissection.  Case was discussed with the on-call neurologist, Dr. Leonel Ramsay, who recommended admission to [facility] for further evaluation and management of potential acute stroke, including MRI brain.  Given the above history, the latest last known normal is greater than 24 hours ago.  Patient not a candidate for tPA or thrombectomy given this timeframe.  Presenting NIHSS score:7.   In terms of potential modifiable acute ischemic CVA risk factors, patient knowledges that he is a current smoker, but otherwise denies any known history of hypertension, hyperlipidemia, type 2 diabetes mellitus, or OSA.  She also denies any known history of paroxysmal atrial fibrillation, and presenting EKG suggestive of sinus arrhythmia.  Not on any blood thinners, including no aspirin as an outpatient, nor she on any statin medications at home.  Neurology has been consulted, with additional recommendations pending at this time, including recommendations for antiplatelet intervention.  For now we will observe permissive hypertension for 24 to  48 hours since onset of initial suspected acute focal neurologic deficits, as further quantified below.    Plan: Nursing bedside swallow evaluation x 1 now, and will not initiate oral medications or diet until the patient has passed this. Head of the bed at 30 degrees. Neuro checks per protocol. VS per protocol. Will allow for permissive hypertension for 24-48 hours following onset of acute focal neurologic deficits, plan for  prn IV labetalol ordered for systolic blood pressure greater than [id number] mmHg or diastolic blood pressure greater than 110 mmHg. Monitor on telemetry, including monitoring for atrial fibrillation as modifiable risk factor for acute ischemic CVA.  Should overnight telemetry not demonstrate evidence of atrial fibrillation, can consider 30-day cardiac event monitor at the time of discharge to further evaluate for this arrhythmia. MRI brain.  TTE with bubble study has been ordered for the morning to evaluate for intracardiac thrombus, septal wall aneurysm, or septal wall defect. Check lipid panel and A1c. PT/OT/ST consults have been ordered to occur in the morning.  Neurology consulted, will follow for recommendations regarding recommended antiplatelet intervention. counseled the patient on the importance of smoking discontinuation, and have placed an inpatient consult to the smoking cessation program.  Check uds given reported history of prior IV cocaine use.  Fall precautions ordered.       #) Bipolar disorder: Documented history of such, on Seroquel nightly as an outpatient in addition to Celexa.   Plan: Pending nursing bedside swallow screen, home Celexa and Seroquel been reordered.      #) Generalized anxiety disorder: On as needed Xanax at home.  Plan: As needed IV Ativan ordered.       #) Chronic bronchitis: Documented history of such: No evidence of acute exacerbation.  On as needed albuterol inhaler as sole outpatient respiratory medication.    Plan: Continue  home as needed albuterol inhaler.      #) HIV: Documented history of such, with most recent CD4 count in June 2022 noted to be 869, representing an increase from most recent prior value of 730 01 December 2019, at which time her HIV RNA viral load was found to be 28.  She reports good compliance with outpatient Genvoya.    Plan: Continue home [place].       #)GERD: On Protonix as an outpatient.  Plan: Continue home ppi.       #) Chronic tobacco abuse: The patient confirms that she is  a current smoker, having smoked approximately half pack per day over the last 25 years.  Plan: Counseled patient on the importance of complete smoking discontinuation, particular in the setting of suspected presenting acute ischemic CVA, as an independent modifiable risk factor for such.  I have also ordered a as needed nicotine patch for use during this hospitalization.    DVT prophylaxis: scd's  Code Status: Full code Family Communication: none Disposition Plan: Per Rounding Team Consults called: Dr. Leonel Ramsay consulted and notified of patient's arrival to [facility], as further detailed above;   Admission status: Observation; med telemetry.     Of note, this patient was added by me to the following Admit List/Treatment Team: [email or web address].      PLEASE NOTE THAT DRAGON DICTATION SOFTWARE WAS USED IN THE CONSTRUCTION OF THIS NOTE.   [facility] Pager [phone] From [place]  Otherwise, please contact night-coverage  [email or web address] Password [facility]   09/17/2020, 6:40 PM

## 2020-09-18 ENCOUNTER — Observation Stay (HOSPITAL_COMMUNITY): Payer: Medicaid Other

## 2020-09-18 ENCOUNTER — Observation Stay (HOSPITAL_BASED_OUTPATIENT_CLINIC_OR_DEPARTMENT_OTHER): Payer: Medicaid Other

## 2020-09-18 ENCOUNTER — Encounter (HOSPITAL_COMMUNITY): Payer: Self-pay | Admitting: *Deleted

## 2020-09-18 DIAGNOSIS — J410 Simple chronic bronchitis: Secondary | ICD-10-CM | POA: Diagnosis present

## 2020-09-18 DIAGNOSIS — F259 Schizoaffective disorder, unspecified: Secondary | ICD-10-CM | POA: Diagnosis present

## 2020-09-18 DIAGNOSIS — K589 Irritable bowel syndrome without diarrhea: Secondary | ICD-10-CM | POA: Diagnosis present

## 2020-09-18 DIAGNOSIS — R29810 Facial weakness: Secondary | ICD-10-CM | POA: Diagnosis present

## 2020-09-18 DIAGNOSIS — I6389 Other cerebral infarction: Secondary | ICD-10-CM | POA: Diagnosis not present

## 2020-09-18 DIAGNOSIS — I639 Cerebral infarction, unspecified: Secondary | ICD-10-CM | POA: Diagnosis present

## 2020-09-18 DIAGNOSIS — Z803 Family history of malignant neoplasm of breast: Secondary | ICD-10-CM | POA: Diagnosis not present

## 2020-09-18 DIAGNOSIS — Z7902 Long term (current) use of antithrombotics/antiplatelets: Secondary | ICD-10-CM | POA: Diagnosis not present

## 2020-09-18 DIAGNOSIS — R55 Syncope and collapse: Secondary | ICD-10-CM | POA: Diagnosis present

## 2020-09-18 DIAGNOSIS — Z6831 Body mass index (BMI) 31.0-31.9, adult: Secondary | ICD-10-CM | POA: Diagnosis not present

## 2020-09-18 DIAGNOSIS — I69354 Hemiplegia and hemiparesis following cerebral infarction affecting left non-dominant side: Secondary | ICD-10-CM | POA: Diagnosis not present

## 2020-09-18 DIAGNOSIS — E669 Obesity, unspecified: Secondary | ICD-10-CM | POA: Diagnosis present

## 2020-09-18 DIAGNOSIS — B20 Human immunodeficiency virus [HIV] disease: Secondary | ICD-10-CM | POA: Diagnosis present

## 2020-09-18 DIAGNOSIS — Z20822 Contact with and (suspected) exposure to covid-19: Secondary | ICD-10-CM | POA: Diagnosis present

## 2020-09-18 DIAGNOSIS — I6312 Cerebral infarction due to embolism of basilar artery: Secondary | ICD-10-CM

## 2020-09-18 DIAGNOSIS — F17210 Nicotine dependence, cigarettes, uncomplicated: Secondary | ICD-10-CM | POA: Diagnosis present

## 2020-09-18 DIAGNOSIS — I959 Hypotension, unspecified: Secondary | ICD-10-CM | POA: Diagnosis not present

## 2020-09-18 DIAGNOSIS — F319 Bipolar disorder, unspecified: Secondary | ICD-10-CM | POA: Diagnosis present

## 2020-09-18 DIAGNOSIS — Z720 Tobacco use: Secondary | ICD-10-CM | POA: Diagnosis not present

## 2020-09-18 DIAGNOSIS — R29707 NIHSS score 7: Secondary | ICD-10-CM | POA: Diagnosis present

## 2020-09-18 DIAGNOSIS — L80 Vitiligo: Secondary | ICD-10-CM | POA: Diagnosis present

## 2020-09-18 DIAGNOSIS — Z79899 Other long term (current) drug therapy: Secondary | ICD-10-CM | POA: Diagnosis not present

## 2020-09-18 DIAGNOSIS — K219 Gastro-esophageal reflux disease without esophagitis: Secondary | ICD-10-CM | POA: Diagnosis present

## 2020-09-18 DIAGNOSIS — Z7982 Long term (current) use of aspirin: Secondary | ICD-10-CM | POA: Diagnosis not present

## 2020-09-18 DIAGNOSIS — I10 Essential (primary) hypertension: Secondary | ICD-10-CM | POA: Diagnosis present

## 2020-09-18 DIAGNOSIS — G8194 Hemiplegia, unspecified affecting left nondominant side: Secondary | ICD-10-CM | POA: Diagnosis present

## 2020-09-18 DIAGNOSIS — E785 Hyperlipidemia, unspecified: Secondary | ICD-10-CM | POA: Diagnosis present

## 2020-09-18 DIAGNOSIS — F411 Generalized anxiety disorder: Secondary | ICD-10-CM | POA: Diagnosis present

## 2020-09-18 DIAGNOSIS — E119 Type 2 diabetes mellitus without complications: Secondary | ICD-10-CM | POA: Diagnosis present

## 2020-09-18 LAB — COMPREHENSIVE METABOLIC PANEL WITH GFR
ALT: 15 U/L (ref 0–44)
AST: 24 U/L (ref 15–41)
Albumin: 3.5 g/dL (ref 3.5–5.0)
Alkaline Phosphatase: 68 U/L (ref 38–126)
Anion gap: 10 (ref 5–15)
BUN: 9 mg/dL (ref 6–20)
CO2: 20 mmol/L — ABNORMAL LOW (ref 22–32)
Calcium: 9.3 mg/dL (ref 8.9–10.3)
Chloride: 107 mmol/L (ref 98–111)
Creatinine, Ser: 0.72 mg/dL (ref 0.44–1.00)
GFR, Estimated: >60 mL/min
Glucose, Bld: 95 mg/dL (ref 70–99)
Potassium: 4.3 mmol/L (ref 3.5–5.1)
Sodium: 137 mmol/L (ref 135–145)
Total Bilirubin: 1.2 mg/dL (ref 0.3–1.2)
Total Protein: 6.8 g/dL (ref 6.5–8.1)

## 2020-09-18 LAB — CBC
HCT: 45.5 % (ref 36.0–46.0)
Hemoglobin: 14.5 g/dL (ref 12.0–15.0)
MCH: 31.4 pg (ref 26.0–34.0)
MCHC: 31.9 g/dL (ref 30.0–36.0)
MCV: 98.5 fL (ref 80.0–100.0)
Platelets: 170 K/uL (ref 150–400)
RBC: 4.62 MIL/uL (ref 3.87–5.11)
RDW: 14.4 % (ref 11.5–15.5)
WBC: 4.6 K/uL (ref 4.0–10.5)
nRBC: 0 % (ref 0.0–0.2)

## 2020-09-18 LAB — LIPID PANEL
Cholesterol: 232 mg/dL — ABNORMAL HIGH (ref 0–200)
HDL: 36 mg/dL — ABNORMAL LOW
LDL Cholesterol: 164 mg/dL — ABNORMAL HIGH (ref 0–99)
Total CHOL/HDL Ratio: 6.4 ratio
Triglycerides: 161 mg/dL — ABNORMAL HIGH
VLDL: 32 mg/dL (ref 0–40)

## 2020-09-18 LAB — ECHOCARDIOGRAM COMPLETE BUBBLE STUDY
Area-P 1/2: 3.51 cm2
S' Lateral: 3.3 cm

## 2020-09-18 LAB — MAGNESIUM: Magnesium: 2.1 mg/dL (ref 1.7–2.4)

## 2020-09-18 LAB — HEMOGLOBIN A1C
Hgb A1c MFr Bld: 5.5 % (ref 4.8–5.6)
Mean Plasma Glucose: 111.15 mg/dL

## 2020-09-18 MED ORDER — OXYCODONE HCL 5 MG PO TABS
5.0000 mg | ORAL_TABLET | Freq: Four times a day (QID) | ORAL | Status: DC | PRN
  Administered 2020-09-18 – 2020-09-19 (×3): 5 mg via ORAL
  Filled 2020-09-18 (×3): qty 1

## 2020-09-18 MED ORDER — ASPIRIN 81 MG PO CHEW
81.0000 mg | CHEWABLE_TABLET | Freq: Every day | ORAL | Status: DC
  Administered 2020-09-18 – 2020-09-21 (×4): 81 mg via ORAL
  Filled 2020-09-18 (×4): qty 1

## 2020-09-18 MED ORDER — ATORVASTATIN CALCIUM 80 MG PO TABS: 80.0000 mg | ORAL_TABLET | Freq: Every day | ORAL | Status: DC

## 2020-09-18 MED ORDER — ATORVASTATIN CALCIUM 10 MG PO TABS
20.0000 mg | ORAL_TABLET | Freq: Every day | ORAL | Status: DC
  Administered 2020-09-18: 20 mg via ORAL
  Filled 2020-09-18: qty 2

## 2020-09-18 MED ORDER — ASPIRIN 300 MG RE SUPP
300.0000 mg | Freq: Every day | RECTAL | Status: DC
  Administered 2020-09-18: 300 mg via RECTAL
  Filled 2020-09-18 (×2): qty 1

## 2020-09-18 MED ORDER — CLOPIDOGREL BISULFATE 75 MG PO TABS
75.0000 mg | ORAL_TABLET | Freq: Every day | ORAL | Status: DC
  Administered 2020-09-18 – 2020-09-21 (×4): 75 mg via ORAL
  Filled 2020-09-18 (×4): qty 1

## 2020-09-18 MED ORDER — VITAMIN B-12 1000 MCG PO TABS
1000.0000 ug | ORAL_TABLET | Freq: Every day | ORAL | Status: DC
  Administered 2020-09-18 – 2020-09-21 (×4): 1000 ug via ORAL
  Filled 2020-09-18 (×4): qty 1

## 2020-09-18 MED ORDER — ATORVASTATIN CALCIUM 40 MG PO TABS: 40.0000 mg | ORAL_TABLET | Freq: Every day | ORAL | Status: DC

## 2020-09-18 MED ORDER — ALPRAZOLAM 0.5 MG PO TABS: 1.0000 mg | ORAL_TABLET | Freq: Three times a day (TID) | ORAL | Status: DC | PRN

## 2020-09-18 NOTE — Progress Notes (Signed)
TCD bubble study has been completed.   Preliminary results in CV Proc.   Abram Sander 09/18/2020 2:22 PM

## 2020-09-18 NOTE — Progress Notes (Signed)
2D Echo attempted, patient in MRI at 8am. Will retry as schedule permits. [place]

## 2020-09-18 NOTE — Evaluation (Signed)
Occupational Therapy Evaluation Patient Details Name: Mackenzie Gonzalez MRN: [id number] DOB: [birth year] Today's Date: 09/18/2020    History of Present Illness Mackenzie Gonzalez is a 60 y.o. female with PMH significant for anxiety, bipolar, chronic bronchitis, Hep C, HIV, schizoaffective disorder, smoker who presents with diziness and left facial droop and weakness. 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   Clinical Impression   PTA, pt was living at home with her sister, pt reports she was independent with ADL/IADL and functional mobility without AD. Pt reports driving on a moped.  Pt currently requires minA+2 for bed mobility, modA+2 for sit<>stand and stand-pivot to simulate toilet transfer. Pt presents with L hemiplegia, instability, decreased coordination and slurred speech impacting independence and safety with ADL/IADL and functional mobility. Due to decline in current level of function, pt would benefit from acute OT to address established goals to facilitate safe D/C to venue listed below. At this time, recommend CIR follow-up. Will continue to follow acutely.     Follow Up Recommendations  CIR    Equipment Recommendations  3 in 1 bedside commode    Recommendations for Other Services       Precautions / Restrictions Precautions Precautions: Fall Restrictions Weight Bearing Restrictions: No      Mobility Bed Mobility Overal bed mobility: Needs Assistance Bed Mobility: Supine to Sit     Supine to sit: Min assist;HOB elevated;+2 for safety/equipment;+2 for physical assistance     General bed mobility comments: minA+2 for BLE and trunk progression    Transfers Overall transfer level: Needs assistance Equipment used: 2 person hand held assist Transfers: Sit to/from [facility] Sit to Stand: Mod assist;+2 physical assistance;+2 safety/equipment Stand pivot transfers: Mod assist;+2 physical assistance;+2 safety/equipment        General transfer comment: modA to powerup into standing and for stability with pivot from EOB toward left to recliner. Pt with decreased foot clearance, and noted to have more narrow base of support    Balance Overall balance assessment: Needs assistance Sitting-balance support: No upper extremity supported;Feet supported Sitting balance-Leahy Scale: Fair Sitting balance - Comments: noted pt to have some rocking/rhythmical movements sitting EOB, when engaging BUE or BLE, movements became more Jerky. no loss of balance noted but pt with decreased ability to maintain static midline position   Standing balance support: Bilateral upper extremity supported Standing balance-Leahy Scale: Poor Standing balance comment: modA+2 from therapist for support in standing with 2 person hand held assistance. While standing pt with increased ataxia and slight buckling noted.                           ADL either performed or assessed with clinical judgement   ADL Overall ADL's : Needs assistance/impaired Eating/Feeding: NPO   Grooming: Minimal assistance;Sitting   Upper Body Bathing: Moderate assistance;Sitting   Lower Body Bathing: Moderate assistance;Sit to/from stand;+2 for physical assistance   Upper Body Dressing : Moderate assistance   Lower Body Dressing: Moderate assistance;+2 for physical assistance;+2 for safety/equipment;Sit to/from stand Lower Body Dressing Details (indicate cue type and reason): requires modA to don socks. Toilet Transfer: Moderate assistance;+2 for physical assistance;+2 for safety/equipment;Stand-pivot Toilet Transfer Details (indicate cue type and reason): simulated from EOB to recliner with 2 person hand held assist Toileting- Clothing Manipulation and Hygiene: Moderate assistance;Sit to/from stand;+2 for physical assistance;+2 for safety/equipment       Functional mobility during ADLs: Moderate assistance;+2 for physical assistance;+2 for  safety/equipment General ADL Comments: pt limited by decreased stability, decreased activity tolerance, L hemiplegia     Vision Baseline Vision/History: 1 Wears glasses Ability to See in Adequate Light: 0 Adequate Patient Visual Report: No change from baseline Vision Assessment?: Yes Ocular Range of Motion: Within Functional Limits Alignment/Gaze Preference: Within Defined Limits Tracking/Visual Pursuits: Decreased smoothness of eye movement to RIGHT superior field (R eye appeared to have decreased speed with pursuits, pt with increased blinking and decreased ability to hold gaze to right side) Saccades: Within functional limits Additional Comments: will continue to assess, pt reports no limitations     Perception     Praxis      Pertinent Vitals/Pain Pain Assessment: 0-10 Pain Score: 8  Pain Location: left abdomen and right wrist, reports previous break in R hand Pain Descriptors / Indicators: Sore Pain Intervention(s): Limited activity within patient's tolerance     Hand Dominance Right   Extremity/Trunk Assessment Upper Extremity Assessment Upper Extremity Assessment: LUE deficits/detail;RUE deficits/detail RUE Deficits / Details: WFL, pt reports hx or breaking her wrist LUE Deficits / Details: sensation intact, L hemiplegia, increased proximal strength, shoulder flexion AROM (with compensatory shoulder hiking and slight abduction) about 70*;minimal elbow flexion, no grip strength this session;decreased functional use, pt attempting to incoorporate LUE in ADL, limited due to hemiplegia LUE Sensation: WNL LUE Coordination: decreased fine motor;decreased gross motor   Lower Extremity Assessment Lower Extremity Assessment: Defer to PT evaluation   Cervical / Trunk Assessment Cervical / Trunk Assessment: Other exceptions Cervical / Trunk Exceptions: noted rocking/writhing movements sitting EOB,   Communication Communication Communication: Expressive difficulties    Cognition Arousal/Alertness: Awake/alert Behavior During Therapy: WFL for tasks assessed/performed Overall Cognitive Status: Within Functional Limits for tasks assessed                                     General Comments  HR 80s, SpO2 >95% RA throughout session    Exercises     Shoulder Instructions      Home Living Family/patient expects to be discharged to:: Private residence Living Arrangements: Other relatives (sister is there all the time except monday and friday) Available Help at Discharge: Family;Available PRN/intermittently Type of Home: House Home Access: Stairs to enter [employer] of Steps: 1   Home Layout: One level     Bathroom Shower/Tub: Teacher, early years/pre: Standard     Home Equipment: None   Additional Comments: sister is there every day except M/F,      Prior Functioning/Environment Level of Independence: Needs assistance  Gait / Transfers Assistance Needed: ambulated independently ADL's / Homemaking Assistance Needed: pt vague with level of assistance/independence, appeared confused with the question.   Comments: pt stated she would drive a moped. when asked if she does her own grocery shopping she began to discuss driving to her son's house        OT Problem List: Impaired balance (sitting and/or standing);Decreased activity tolerance;Decreased range of motion;Decreased strength;Decreased safety awareness;Decreased coordination;Decreased knowledge of use of DME or AE;Pain;Impaired UE functional use      OT Treatment/Interventions: Self-care/ADL training;Therapeutic exercise;Energy conservation;DME and/or AE instruction;Therapeutic activities;Patient/family education;Balance training;Neuromuscular education    OT Goals(Current goals can be found in the care plan section) Acute Rehab OT Goals Patient Stated Goal: to get back to normal OT Goal Formulation: With patient Time For Goal Achievement:  10/02/20 Potential to Achieve Goals: Good ADL Goals Pt  Will Perform Grooming: standing;with min assist Pt Will Perform Lower Body Dressing: with min guard assist;sit to/from stand Pt Will Transfer to Toilet: with min guard assist;ambulating Pt/caregiver will Perform Home Exercise Program: Increased strength;Left upper extremity;Independently;Increased ROM  OT Frequency: Min 2X/week   Barriers to D/C:            Co-evaluation PT/OT/SLP Co-Evaluation/Treatment: Yes Reason for Co-Treatment: For patient/therapist safety;To address functional/ADL transfers;Complexity of the patient's impairments (multi-system involvement)   OT goals addressed during session: ADL's and self-care      AM-PAC OT "6 Clicks" Daily Activity     Outcome Measure Help from another person eating meals?: Total Help from another person taking care of personal grooming?: A Little Help from another person toileting, which includes using toliet, bedpan, or urinal?: A Lot Help from another person bathing (including washing, rinsing, drying)?: A Lot Help from another person to put on and taking off regular upper body clothing?: A Lot Help from another person to put on and taking off regular lower body clothing?: A Lot 6 Click Score: 12   End of Session Equipment Utilized During Treatment: Gait belt Nurse Communication: Mobility status  Activity Tolerance: Patient tolerated treatment well Patient left: in chair;with call bell/phone within reach;with chair alarm set  OT Visit Diagnosis: Other abnormalities of gait and mobility (R26.89);Muscle weakness (generalized) (M62.81);Other symptoms and signs involving cognitive function;Pain;Hemiplegia and hemiparesis;Unsteadiness on feet (R26.81) Hemiplegia - Right/Left: Left Hemiplegia - dominant/non-dominant: Non-Dominant Hemiplegia - caused by: Cerebral infarction Pain - Right/Left: Left Pain - part of body:  (abdomen and right wrist)                Time: [id number] OT  Time Calculation (min): 25 min Charges:  OT General Charges $OT Visit: 1 Visit OT Evaluation $OT Eval Moderate Complexity: [place] OTR/L Acute Rehabilitation Services Office: [place] 09/18/2020, 11:39 AM

## 2020-09-18 NOTE — Evaluation (Signed)
Clinical/Bedside Swallow Evaluation Patient Details  Name: Mackenzie Gonzalez MRN: [id number] Date of Birth: [birth year]  Today's Date: 09/18/2020 Time: SLP Start Time (ACUTE ONLY): 1203 SLP Stop Time (ACUTE ONLY): 1215 SLP Time Calculation (min) (ACUTE ONLY): 12 min  Past Medical History:  Past Medical History:  Diagnosis Date   Anxiety    Bipolar 1 disorder ([place])    Chronic bronchitis ([place])    Hepatitis C carrier ([place])    HIV (human immunodeficiency virus infection) ([place]) 2015   HIV (human immunodeficiency virus infection) ([place])    Psychiatric disorder    reported h/o schizoaffective, bipolar, anxiety   Schizoaffective disorder ([place])    Past Surgical History:  Past Surgical History:  Procedure Laterality Date   BIOPSY  06/18/2020   Procedure: BIOPSY;  Surgeon: [name], MD;  Location: [facility];  Service: Gastroenterology;;   BREAST EXCISIONAL BIOPSY Left 1987   benign   ESOPHAGOGASTRODUODENOSCOPY (EGD) WITH PROPOFOL N/A 06/18/2020   Procedure: ESOPHAGOGASTRODUODENOSCOPY (EGD) WITH PROPOFOL;  Surgeon: [name], MD;  Location: [facility];  Service: Gastroenterology;  Laterality: N/A;   I & D EXTREMITY Right 06/16/2020   Procedure: Open debridement of skin subcutaneous tissue and bone associated with open grade 2 fracture right hand;Radiographs 3 views right hand Complex wound closure degloving injury dorsal aspect of the hand greater than 10 cm. Open reduction and internal fixation right small finger metacarpal shaft ;  Surgeon: [name], MD;  Location: [facility];  Service: Orthopedics;  Laterality: Right;   ORIF WRIST FRACTURE Left 06/16/2020   Procedure: Open reduction internal fixation displaced intra-articular distal radius fracture left wrist 3 more fragments; Radiographs 3 views left wrist Left wrist brachioadialis tendon release, tendon tenotomy;  Surgeon: [name], MD;  Location: [facility];  Service: Orthopedics;  Laterality: Left;   SP ARTHRO WRIST*R*      WRIST SURGERY     HPI:  Pt is a 60 yo female presenting with dizziness, L sided weakness, and facial droop. MRI showed acute infarcts in the right pons and right cerebellum. PMH includes: anxiety, bipolar, chronic bronchitis, Hep C, HIV, schizoaffective disorder, smoker   Assessment / Plan / Recommendation Clinical Impression  Pt has L-sided facial weakness that contributes to buccal pocketing, but with adequate labial seal to prevent any anterior loss. She has intermittent s/s of aspiration with thin liquids in particular, noted more frequently after solids were introduced or when challenging her with larger volumes. Recommend MBS to better evaluate oropharyngeal swallow. Discussed with pt/son recommendation to select softer foods pending completion and avoid thin liquids; could do meds in puree. Will f/u with additional recommendations after MBS, which is tentatively scheduled for this afternoon. SLP Visit Diagnosis: Dysphagia, unspecified (R13.10)    Aspiration Risk  Moderate aspiration risk    Diet Recommendation Dysphagia 3 (Mech soft)   Medication Administration: Whole meds with puree Compensations: Slow rate;Small sips/bites;Lingual sweep for clearance of pocketing Postural Changes: Seated upright at 90 degrees    Other  Recommendations Oral Care Recommendations: Oral care BID   Follow up Recommendations  (tba)      Frequency and Duration            Prognosis Prognosis for Safe Diet Advancement: Good Barriers to Reach Goals: Cognitive deficits      Swallow Study   General HPI: Pt is a 60 yo female presenting with dizziness, L sided weakness, and facial droop. MRI showed acute infarcts in the right pons and right cerebellum. PMH includes: anxiety, bipolar, chronic  bronchitis, Hep C, HIV, schizoaffective disorder, smoker Type of Study: Bedside Swallow Evaluation Previous Swallow Assessment: none in chart Diet Prior to this Study: Regular;Thin liquids Temperature Spikes  Noted: No Respiratory Status: Room air History of Recent Intubation: No Behavior/Cognition: Alert;Cooperative Oral Cavity Assessment: Within Functional Limits Oral Care Completed by SLP: No Oral Cavity - Dentition: Adequate natural dentition;Missing dentition Vision: Functional for self-feeding Self-Feeding Abilities: Able to feed self Patient Positioning: Upright in chair Baseline Vocal Quality: Normal Volitional Cough: Strong Volitional Swallow: Able to elicit    Oral/Motor/Sensory Function Overall Oral Motor/Sensory Function: Moderate impairment Facial ROM: Reduced left;Suspected CN VII (facial) dysfunction Facial Symmetry: Abnormal symmetry left;Suspected CN VII (facial) dysfunction Lingual ROM: Within Functional Limits Lingual Symmetry: Within Functional Limits Lingual Strength: Reduced Velum: Within Functional Limits   Ice Chips Ice chips: Not tested   Thin Liquid Thin Liquid: Impaired Presentation: Cup;Self Fed;Straw Pharyngeal  Phase Impairments: Cough - Immediate    Nectar Thick Nectar Thick Liquid: Not tested   Honey Thick Honey Thick Liquid: Not tested   Puree Puree: Within functional limits Presentation: Self Fed;Spoon   Solid     Solid: Impaired Presentation: Self Fed Oral Phase Functional Implications: Left lateral sulci pocketing      Osie Bond., M.A. [place] Acute Rehabilitation Services Pager [phone] Office [phone]  09/18/2020,12:49 PM

## 2020-09-18 NOTE — Evaluation (Signed)
Speech Language Pathology Evaluation Patient Details Name: Mackenzie Gonzalez MRN: [id number] DOB: 1960/10/17 Today's Date: 09/18/2020 Time: [id number] SLP Time Calculation (min) (ACUTE ONLY): 18 min  Problem List:  Patient Active Problem List   Diagnosis Date Noted   Facial droop 09/17/2020   Left hemiparesis ([place]) 09/17/2020   Right wrist fracture, sequela 07/11/2020   Dysphagia 06/29/2020   Left wrist fracture, sequela 06/25/2020   Tobacco dependence 06/18/2020   Anemia due to GI blood loss    Esophagitis, [place] grade D    HIV (human immunodeficiency virus infection) ([place])    Psychiatric disorder    Hand injuries, unspecified laterality, sequela 06/16/2020   Herpes zoster 03/16/2020   Mixed stress and urge urinary incontinence 03/16/2020   Suspected orbital vs preseptal cellultiis 01/14/2020   Constipation 01/07/2020   Vasomotor symptoms due to menopause 12/15/2019   Pain in joint involving multiple sites 12/15/2019   Encounter for preventive care 12/15/2019   Simple chronic bronchitis ([place]) 09/02/2019   Anxiety disorder 07/12/2019   Chronic pain 05/25/2019   Hepatitis B immune 04/20/2019   Hepatitis A immune 04/20/2019   Chronic viral hepatitis C ([place]) 04/20/2019   HIV disease ([place]) 04/20/2019   Tobacco abuse 04/20/2019   Schizoaffective disorder, bipolar type ([place]) 04/20/2019   GERD (gastroesophageal reflux disease) 2015   Slurred speech 2015   Past Medical History:  Past Medical History:  Diagnosis Date   Anxiety    Bipolar 1 disorder ([place])    Chronic bronchitis ([place])    Hepatitis C carrier ([place])    HIV (human immunodeficiency virus infection) ([place]) 2015   HIV (human immunodeficiency virus infection) ([place])    Psychiatric disorder    reported h/o schizoaffective, bipolar, anxiety   Schizoaffective disorder ([place])    Past Surgical History:  Past Surgical History:  Procedure Laterality Date   BIOPSY  06/18/2020   Procedure: BIOPSY;  Surgeon: [name], MD;  Location: [facility];  Service: Gastroenterology;;   BREAST EXCISIONAL BIOPSY Left 1987   benign   ESOPHAGOGASTRODUODENOSCOPY (EGD) WITH PROPOFOL N/A 06/18/2020   Procedure: ESOPHAGOGASTRODUODENOSCOPY (EGD) WITH PROPOFOL;  Surgeon: [name], MD;  Location: [facility];  Service: Gastroenterology;  Laterality: N/A;   I & D EXTREMITY Right 06/16/2020   Procedure: Open debridement of skin subcutaneous tissue and bone associated with open grade 2 fracture right hand;Radiographs 3 views right hand Complex wound closure degloving injury dorsal aspect of the hand greater than 10 cm. Open reduction and internal fixation right small finger metacarpal shaft ;  Surgeon: [name], MD;  Location: [facility];  Service: Orthopedics;  Laterality: Right;   ORIF WRIST FRACTURE Left 06/16/2020   Procedure: Open reduction internal fixation displaced intra-articular distal radius fracture left wrist 3 more fragments; Radiographs 3 views left wrist Left wrist brachioadialis tendon release, tendon tenotomy;  Surgeon: [name], MD;  Location: [facility];  Service: Orthopedics;  Laterality: Left;   SP ARTHRO WRIST*R*     WRIST SURGERY     HPI:  Pt is a 60 yo female presenting with dizziness, L sided weakness, and facial droop. MRI showed acute infarcts in the right pons and right cerebellum. PMH includes: anxiety, bipolar, chronic bronchitis, Hep C, HIV, schizoaffective disorder, smoker   Assessment / Plan / Recommendation Clinical Impression  Pt has a moderate dysarthria, impacted by L-sided facial weakness that impacts articulation. Intelligibilty is at least mildly reduced at the sentence level but during moments of lability when she is more emotional, intelligibility decreases  further. She shows good intellectual awareness but has more difficulty with emergent and anticipatory awareness. For example she knows that she is having trouble with L weakness, but says that she can walk without  assistance despite PT note that indicates that she needed Mod A +2 for transfers (not able to progress to gait training today). She needs a moderate amount of repetition to try to store information for delayed recall task, but then later is able to recall with minimal cues. Recommend CIR f/u to maximize cognition and communication.    SLP Assessment  SLP Recommendation/Assessment: Patient needs continued Speech Lanaguage Pathology Services SLP Visit Diagnosis: Cognitive communication deficit (R41.841);Dysarthria and anarthria (R47.1)    Follow Up Recommendations  Inpatient Rehab    Frequency and Duration min 2x/week  2 weeks      SLP Evaluation Cognition  Overall Cognitive Status: Impaired/Different from baseline Arousal/Alertness: Awake/alert Orientation Level: Oriented X4 Attention: Selective Selective Attention: Impaired Selective Attention Impairment: Verbal basic Memory: Impaired Memory Impairment: Storage deficit Awareness: Impaired Awareness Impairment: Emergent impairment;Anticipatory impairment Behaviors: Impulsive;Lability Safety/Judgment: Impaired       Comprehension  Auditory Comprehension Overall Auditory Comprehension: Appears within functional limits for tasks assessed    Expression Expression Primary Mode of Expression: Verbal Verbal Expression Overall Verbal Expression: Appears within functional limits for tasks assessed Written Expression Dominant Hand: Right   Oral / Motor  Oral Motor/Sensory Function Overall Oral Motor/Sensory Function: Moderate impairment Facial ROM: Reduced left;Suspected CN VII (facial) dysfunction Facial Symmetry: Abnormal symmetry left;Suspected CN VII (facial) dysfunction Lingual ROM: Within Functional Limits Lingual Symmetry: Within Functional Limits Lingual Strength: Reduced Velum: Within Functional Limits Motor Speech Overall Motor Speech: Impaired Respiration: Within functional limits Phonation: Normal Resonance:  Within functional limits Articulation: Impaired Level of Impairment: Word Intelligibility: Intelligibility reduced Word: 75-100% accurate Phrase: 75-100% accurate Sentence: 50-74% accurate (decreased while crying, more emotional) Motor Planning: Witnin functional limits   GO                    Osie Bond., M.A. [place] Acute Rehabilitation Services Pager [phone] Office [phone]  09/18/2020, 12:58 PM

## 2020-09-18 NOTE — Progress Notes (Signed)
Modified Barium Swallow Progress Note  Patient Details  Name: Mackenzie Gonzalez MRN: [id number] Date of Birth: [birth year]  Today's Date: 09/18/2020  Modified Barium Swallow completed.  Full report located under Chart Review in the Imaging Section.  Brief recommendations include the following:  Clinical Impression  Pt presents with functional oropharyngeal swallow although with occasional delay in swallow trigger resulting in flash penetration of thin liquids above and to the vocal cords x1 (PAS 2 & 4), but fully ejected with strength of laryngeal vestibule closure and spontaneous cough response. Penetration observed to be deeper with consecutive sips, improved with small controlled sips via cup or straw. Oral preparation of regular solids was mildy prolonged, however suspect this is due to missing dentition. Bolus cohesion, BOT retraction and pharyngeal stripping were adequate resulting in full oropharyngeal clearance of POs, minimal to no residuals noted. Recommend regular/thin liquid diet with use of controlled sips of liquids via cup or straw. SLP to f/u for tolerance and use of basic strategies.   Swallow Evaluation Recommendations       SLP Diet Recommendations: Regular solids;Thin liquid   Liquid Administration via: Straw;Cup   Medication Administration: Whole meds with liquid   Supervision: Patient able to self feed;Intermittent supervision to cue for compensatory strategies   Compensations: Slow rate;Small sips/bites   Postural Changes: Seated upright at 90 degrees   Oral Care Recommendations: Oral care BID      Ellwood Dense, [place], [employer] Office Number: [phone]   Acie Fredrickson 09/18/2020,3:43 PM

## 2020-09-18 NOTE — Progress Notes (Addendum)
STROKE TEAM PROGRESS NOTE   ATTENDING NOTE: I reviewed above note and agree with the assessment and plan. Pt was seen and examined.   60 year old female with history of bipolar, hepatitis C, HIV, smoker, substance abuse admitted for dizziness, vertigo, left facial droop and left-sided weakness.  CT no acute finding.  CT head and neck no LVO but small posterior circulation including VAs and BA with bilateral PCOMs.  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  EF 55 to 60%, TCD bubble study negative for PFO.  A1c 5.5, LDL 164, UDS positive for THC.  B12 146 in 05/2020, RPR negative in 06/2020.  History of HIV, on HARRT therapy, last viral load and CD4 in 06/2020 showed no viral load detected and CD4 869.  On exam, patient lying in bed, sleepy with blanket cover over her face, not cooperative with exam, wanted to sleep.  Orientated to year, place and age but not to months.  Paucity of speech, no aphasia, but mild dysarthria.  Follows simple commands but not quite cooperative with exam.  Visual fields full, no gaze palsy, left facial droop.  Left upper extremity 4/5 proximal, 3 -/5 finger grip, left lower extremity 4/5 proximal and distal. Sensation, coordination not corporative and gait not tested.  Etiology for patient stroke likely small/large vessel disease in the setting of hypoplastic posterior circulation with uncontrolled risk factors including smoking, substance abuse, hyperlipidemia and hypertension.  Recent CD4 normal and HIV viral load undetectable, and no evidence of CNS infection or infectious vasculitis at this time.  However, given 2 different location, cardioembolic source cannot be completely ruled out.  TCD bubble study negative, no evidence of PFO.  Recommend 30-day cardiac event monitoring as outpatient to rule out A. fib.  Continue aspirin 81 and Plavix 75 DAPT for 3 weeks and then aspirin alone.  Continue Lipitor 40.  PT/OT recommend CIR.  For detailed assessment and plan,  please refer to above as I have made changes wherever appropriate.   Neurology will sign off. Please call with questions. Pt will follow up with stroke clinic NP at [facility] in about 4 weeks. Thanks for the consult.   Mackenzie Hawking, MD PhD Stroke Neurology 09/18/2020 4:41 PM    INTERVAL HISTORY Patient is lying in the bed with covers over her face. She says she wants to sleep. No family present @ bedside. She voices no concerns. Educated patient on smoking cessation and illicit drug use.  Vitals:   09/18/20 0422 09/18/20 0500 09/18/20 0622 09/18/20 0855  BP: 127/86  131/80 (!) 128/94  Pulse: 81  (!) 59 74  Resp: (!) '23  19 13  '$ Temp: 98.4 F (36.9 C)  97.8 F (36.6 C) 98.5 F (36.9 C)  TempSrc: Axillary  Axillary Oral  SpO2: 96%  96% 97%  Weight:  87.2 kg    Height:       CBC:  Recent Labs  Lab 09/17/20 1054 09/18/20 0222  WBC 5.9 4.6  NEUTROABS 3.5  --   HGB 13.4 14.5  HCT 40.4 45.5  MCV 92.9 98.5  PLT 204 [id number]   Basic Metabolic Panel:  Recent Labs  Lab 09/17/20 1054 09/18/20 0222  NA 140 137  K 4.1 4.3  CL 108 107  CO2 24 20*  GLUCOSE 95 95  BUN 9 9  CREATININE 0.71 0.72  CALCIUM 9.4 9.3  MG  --  2.1   Lipid Panel:  Recent Labs  Lab 09/18/20 0222  CHOL 232*  TRIG 161*  HDL 36*  CHOLHDL 6.4  VLDL 32  LDLCALC 164*   HgbA1c:  Recent Labs  Lab 09/18/20 0222  HGBA1C 5.5   Urine Drug Screen:  Recent Labs  Lab 09/17/20 2040  LABOPIA NONE DETECTED  COCAINSCRNUR NONE DETECTED  LABBENZ POSITIVE*  AMPHETMU NONE DETECTED  THCU POSITIVE*  LABBARB NONE DETECTED    Alcohol Level No results for input(s): ETH in the last 168 hours.  IMAGING past 24 hours CT Angio Head W or Wo Contrast  Result Date: 09/17/2020 CLINICAL DATA:  Neuro deficit, acute, stroke suspected EXAM: CT ANGIOGRAPHY HEAD AND NECK TECHNIQUE: Multidetector CT imaging of the head and neck was performed using the standard protocol during bolus administration of intravenous contrast.  Multiplanar CT image reconstructions and MIPs were obtained to evaluate the vascular anatomy. Carotid stenosis measurements (when applicable) are obtained utilizing NASCET criteria, using the distal internal carotid diameter as the denominator. CONTRAST:  28m OMNIPAQUE IOHEXOL 350 MG/ML SOLN COMPARISON:  None. FINDINGS: CTA NECK Aortic arch: Great vessel origins are patent. Right carotid system: Patent. Minimal calcified plaque along the proximal internal carotid. No stenosis. Left carotid system: Patent. Minimal calcified plaque along the proximal internal carotid. No stenosis. Vertebral arteries: Patent. Right vertebral artery is dominant. No stenosis. Skeleton: Mild cervical spine degenerative changes, greatest at C5-C6. Other neck: Unremarkable. Upper chest: No apical lung mass. Review of the MIP images confirms the above findings CTA HEAD Anterior circulation: Intracranial internal carotid arteries are patent. Anterior and middle cerebral arteries are patent. Posterior circulation: Intracranial vertebral arteries are patent. Basilar artery is patent. Major cerebellar artery origins are patent. Bilateral posterior communicating arteries are present. Venous sinuses: Patent as allowed by contrast bolus timing. Review of the MIP images confirms the above findings IMPRESSION: No large vessel occlusion, hemodynamically significant stenosis, or evidence of dissection. Electronically Signed   By: PMacy MisM.D.   On: 09/17/2020 13:02   CT Angio Neck W and/or Wo Contrast  Result Date: 09/17/2020 CLINICAL DATA:  Neuro deficit, acute, stroke suspected EXAM: CT ANGIOGRAPHY HEAD AND NECK TECHNIQUE: Multidetector CT imaging of the head and neck was performed using the standard protocol during bolus administration of intravenous contrast. Multiplanar CT image reconstructions and MIPs were obtained to evaluate the vascular anatomy. Carotid stenosis measurements (when applicable) are obtained utilizing NASCET  criteria, using the distal internal carotid diameter as the denominator. CONTRAST:  732mOMNIPAQUE IOHEXOL 350 MG/ML SOLN COMPARISON:  None. FINDINGS: CTA NECK Aortic arch: Great vessel origins are patent. Right carotid system: Patent. Minimal calcified plaque along the proximal internal carotid. No stenosis. Left carotid system: Patent. Minimal calcified plaque along the proximal internal carotid. No stenosis. Vertebral arteries: Patent. Right vertebral artery is dominant. No stenosis. Skeleton: Mild cervical spine degenerative changes, greatest at C5-C6. Other neck: Unremarkable. Upper chest: No apical lung mass. Review of the MIP images confirms the above findings CTA HEAD Anterior circulation: Intracranial internal carotid arteries are patent. Anterior and middle cerebral arteries are patent. Posterior circulation: Intracranial vertebral arteries are patent. Basilar artery is patent. Major cerebellar artery origins are patent. Bilateral posterior communicating arteries are present. Venous sinuses: Patent as allowed by contrast bolus timing. Review of the MIP images confirms the above findings IMPRESSION: No large vessel occlusion, hemodynamically significant stenosis, or evidence of dissection. Electronically Signed   By: PrMacy Mis.D.   On: 09/17/2020 13:02   MR BRAIN WO CONTRAST  Result Date: 09/18/2020 CLINICAL DATA:  Stroke, follow up stroke/tia EXAM: MRI HEAD WITHOUT CONTRAST  TECHNIQUE: Multiplanar, multiecho pulse sequences of the brain and surrounding structures were obtained without intravenous contrast. COMPARISON:  CT head and CTA from 09/17/2020 FINDINGS: Brain: Acute infarct in the right paramidline pons (series 3, image 13). Multiple additional small acute infarcts in the right cerebellum. Mild associated edema without mass effect. Mild to moderate scattered additional T2 hyperintensities within the white matter, nonspecific but most likely related to chronic microvascular ischemic  disease. No evidence of hydrocephalus, acute hemorrhage, mass lesion, midline shift, or extra-axial fluid collection. Basal cisterns are patent. Vascular: Major arterial flow voids are maintained at the skull base. Please see recent CTA for evaluation of vasculature. Skull and upper cervical spine: Normal marrow signal. Sinuses/Orbits: Largely clear sinuses.  No acute orbital findings. Other: No sizable mastoid effusions. IMPRESSION: 1. Acute infarcts in the right pons and right cerebellum. Mild associated edema without mass effect. 2. Mild to moderate chronic microvascular ischemic disease. Electronically Signed   By: Margaretha Sheffield M.D.   On: 09/18/2020 08:37    PHYSICAL EXAM  Temp:  [97.3 F (36.3 C)-98.7 F (37.1 C)] 98.5 F (36.9 C) (08/22 0855) Pulse Rate:  [59-81] 74 (08/22 0855) Resp:  [0-49] 13 (08/22 0855) BP: (117-180)/(75-150) 128/94 (08/22 0855) SpO2:  [92 %-99 %] 97 % (08/22 0855) Weight:  [84 kg-87.2 kg] 87.2 kg (08/22 0500)  General - Well nourished, well developed, in no apparent distress.  Ophthalmologic - normal sclera  Cardiovascular - Regular rhythm and rate.  Mental Status -  Level of arousal and orientation to time, place, and person were intact. Language  including expression, naming, repetition, comprehension was assessed and found intact. dysarthric, Attention span and concentration were normal. Recent and remote memory were intact. Fund of Knowledge was assessed and was intact.  Cranial Nerves II - XII - II - Visual field intact OU. Pupils equal and reactive III, IV, VI - Extraocular movements intact. V - Facial sensation intact bilaterally. VII - Left facial asymmetry. VIII - Hearing & vestibular intact bilaterally. X - Palate elevates symmetrically. XI - Chin turning & shoulder shrug intact bilaterally. XII - Tongue protrusion intact.  Motor Strength - Left upper 4/5, very weak left grip, Left lower 4/5. Right upper and right lower 5/5. Bulk was  normal and fasciculations were absent.   Motor Tone - Muscle tone was assessed at the neck and appendages and was normal.  Sensory - Light touch, temperature/pinprick were assessed and were symmetrical.    Coordination - The patient had normal movements in the hands and feet with no ataxia or dysmetria.  Tremor was absent.  Gait and Station - deferred.   ASSESSMENT/PLAN Mackenzie Gonzalez is a 60 y.o. female with history of  anxiety, bipolar, chronic bronchitis, Hep C, HIV, schizoaffective disorder, smoker presenting with dizziness, left side weakness and facial droop. NIHSS 7  Stroke:  Acute Right pons and right cerebellum infarct secondary to cardioembolic or small vessel disease source CT head No acute abnormality.   CTA head NO LVO   CTA neck No LVO   MRI  Acute infarcts in the right pons and right cerebellum. Mild associated edema without mass effect. TCD with bubbly pending  May need to consider a 30d cardiac monitoring  [place] lower extremity to eval for DVT- pending  2D Echo pending LDL 164 HgbA1c 5.5 VTE prophylaxis - SCd's    Diet   Diet Heart Room service appropriate? Yes; Fluid consistency: Thin   No antithrombotic prior to admission, now on aspirin 81  mg daily and clopidogrel 75 mg daily.  Therapy recommendations:  pending Disposition:  pending  Hypertension Home meds:  none Stable Permissive hypertension (OK if < 220/120) but gradually normalize in 5-7 days Long-term BP goal normotensive  Hyperlipidemia Home meds:  none,  LDL 164, goal < 70 Add atorvastatin '80mg'$  Continue statin at discharge  Diabetes type II Controlled Home meds:  none HgbA1c 5.5, goal < 7.0 CBGs No results for input(s): GLUCAP in the last 72 hours.  SSI  Other Stroke Risk Factors Cigarette smoker advised to stop smoking ETOH use, alcohol level 60, advised to drink no more than 1 drink(s) a day Substance abuse - UDS:  THC POSITIVE, Cocaine NONE DETECTED. Patient advised to stop using  due to stroke risk. Obesity, Body mass index is 31.99 kg/m., BMI >/= 30 associated with increased stroke risk, recommend weight loss, diet and exercise as appropriate   Hospital day # 0  Beulah Gandy, NP   To contact Stroke Continuity provider, please refer to [email or web address] After hours, contact General Neurology

## 2020-09-18 NOTE — Plan of Care (Signed)
Patient is alert oriented x 4. Slurred speech. Restless, Pt c/o pain to left arm, abdomen, left leg: numbness, tingling, and pain. PRN IV pain medication given with effective results. Pt gone to MRI, PRN ativan given per order.  Pts son updated per pts request.   Problem: Education: Goal: Knowledge of General Education information will improve Description: Including pain rating scale, medication(s)/side effects and non-pharmacologic comfort measures Outcome: Progressing   Problem: Health Behavior/Discharge Planning: Goal: Ability to manage health-related needs will improve Outcome: Progressing   Problem: Clinical Measurements: Goal: Ability to maintain clinical measurements within normal limits will improve Outcome: Progressing Goal: Will remain free from infection Outcome: Progressing Goal: Diagnostic test results will improve Outcome: Progressing Goal: Respiratory complications will improve Outcome: Progressing Goal: Cardiovascular complication will be avoided Outcome: Progressing   Problem: Activity: Goal: Risk for activity intolerance will decrease Outcome: Progressing   Problem: Nutrition: Goal: Adequate nutrition will be maintained Outcome: Progressing   Problem: Coping: Goal: Level of anxiety will decrease Outcome: Progressing   Problem: Elimination: Goal: Will not experience complications related to bowel motility Outcome: Progressing Goal: Will not experience complications related to urinary retention Outcome: Progressing   Problem: Elimination: Goal: Will not experience complications related to urinary retention Outcome: Progressing   Problem: Pain Managment: Goal: General experience of comfort will improve Outcome: Progressing   Problem: Skin Integrity: Goal: Risk for impaired skin integrity will decrease Outcome: Progressing   Problem: Safety: Goal: Ability to remain free from injury will improve Outcome: Progressing   Problem: Skin  Integrity: Goal: Risk for impaired skin integrity will decrease Outcome: Progressing   Problem: Education: Goal: Knowledge of disease or condition will improve Outcome: Progressing Goal: Knowledge of secondary prevention will improve Outcome: Progressing Goal: Knowledge of patient specific risk factors addressed and post discharge goals established will improve Outcome: Progressing Goal: Individualized Educational Video(s) Outcome: Progressing

## 2020-09-18 NOTE — Progress Notes (Signed)
Inpatient Rehabilitation Admissions Coordinator   Inpatient rehab consult received. I will follow up Tuesday for full assessment.  Danne Baxter, RN, MSN Rehab Admissions Coordinator [phone] 09/18/2020 5:09 PM

## 2020-09-18 NOTE — TOC CAGE-AID Note (Signed)
Transition of Care [facility]) - CAGE-AID Screening   Patient Details  Name: Mackenzie Gonzalez MRN: [id number] Date of Birth: 08-28-1960  Transition of Care [facility]) CM/SW Contact:    [place], [place] Phone Number: 09/18/2020, 3:02 PM   Clinical Narrative: Pt is unable to participate in Cage Aid.   Seren Chaloux Tarpley-Carter, MSW, LCSW-A Pronouns:  She/Her/Hers [facility] HealthTransitions of Care Clinical Social Worker Direct Number:  [phone] [email or web address]    CAGE-AID Screening: Substance Abuse Screening unable to be completed due to: : Patient unable to participate

## 2020-09-18 NOTE — Progress Notes (Signed)
Inpatient Rehab Admissions Coordinator:   Per theray recommendations,  patient was screened for CIR candidacy by Clemens Catholic, MS [facility] SLP. At this time, Pt. Appears to demonstrate medical necessity, functional decline, and ability to tolerate intensity of CIR. Pt. is a potential candidate for CIR. I will place   order for rehab consult per protocol for full assessment. Please contact me any with questions.Clemens Catholic, [place], [place] Admissions Coordinator  [phone] ([place]) [phone] (office)

## 2020-09-18 NOTE — Plan of Care (Signed)

## 2020-09-18 NOTE — Progress Notes (Signed)
  Echocardiogram 2D Echocardiogram has been performed.  Matilde Bash 09/18/2020, 1:56 PM

## 2020-09-18 NOTE — Evaluation (Signed)
Physical Therapy Evaluation Patient Details Name: Mackenzie Gonzalez MRN: [id number] DOB: 06/10/1960 Today's Date: 09/18/2020   History of Present Illness  Mackenzie Gonzalez is a 60 y.o. female with PMH significant for anxiety, bipolar, chronic bronchitis, Hep C, HIV, schizoaffective disorder, smoker who presents with dizziness and left facial droop and weakness. 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   Clinical Impression  Pt admitted with above diagnosis. Pt currently with functional limitations due to the deficits listed below (see PT Problem List). At the time of PT eval pt was able to perform transfers with up to +2 mod assist for balance support and safety with OOB mobility. PTA pt reports being independent and using a moped/scooter for transportation. Feel this patient will benefit from CIR to maximize functional independence and safety, decrease risk for falls, and to decrease caregiver burden upon return home with sister. Acutely, pt will benefit from skilled PT to increase their independence and safety with mobility to allow discharge to the venue listed below.       Follow Up Recommendations CIR    Equipment Recommendations  None recommended by PT (TBD by next venue of care)    Recommendations for Other Services Rehab consult     Precautions / Restrictions Precautions Precautions: Fall Restrictions Weight Bearing Restrictions: No      Mobility  Bed Mobility Overal bed mobility: Needs Assistance Bed Mobility: Supine to Sit     Supine to sit: Min assist;HOB elevated;+2 for safety/equipment;+2 for physical assistance     General bed mobility comments: min A +2 for BLE and trunk progression to full sitting position. Increased cues required and hand-over-hand assist to reach with LUE for railing.    Transfers Overall transfer level: Needs assistance Equipment used: 2 person hand held assist Transfers: Sit to/from [facility] Sit  to Stand: Mod assist;+2 physical assistance;+2 safety/equipment Stand pivot transfers: Mod assist;+2 physical assistance;+2 safety/equipment       General transfer comment: modA to powerup into standing and for stability with pivot from EOB toward left to recliner. Pt with decreased foot clearance, and noted to have more narrow base of support. Pt rushing to get to chair, decreased safety awareness noted.  Ambulation/Gait             General Gait Details: Unable to progress to gait training this session.  Stairs            Wheelchair Mobility    Modified Rankin (Stroke Patients Only)       Balance Overall balance assessment: Needs assistance Sitting-balance support: No upper extremity supported;Feet supported Sitting balance-Leahy Scale: Fair Sitting balance - Comments: noted pt to have some rocking/rhythmical movements sitting EOB, when engaging BUE or BLE, movements became more Jerky. no loss of balance noted but pt with decreased ability to maintain static midline position   Standing balance support: Bilateral upper extremity supported Standing balance-Leahy Scale: Poor Standing balance comment: mod A +2 from therapists for support in standing with 2 person hand held assistance. While standing pt with increased ataxia and slight buckling noted.                             Pertinent Vitals/Pain Pain Assessment: 0-10 Pain Score: 8  Pain Location: left abdomen and right wrist, reports previous break in R hand Pain Descriptors / Indicators: Sore Pain Intervention(s): Limited activity within patient's tolerance;Monitored during session;Repositioned    Home Living  Family/patient expects to be discharged to:: Private residence Living Arrangements: Other relatives (sister is there all the time except monday and friday) Available Help at Discharge: Family;Available PRN/intermittently Type of Home: House Home Access: Stairs to enter   Entrance Stairs-Number  of Steps: 1 Home Layout: One level Home Equipment: None Additional Comments: sister is there every day except M/F, states she is alone for most of those days, but otherwise sister present.    Prior Function Level of Independence: Needs assistance   Gait / Transfers Assistance Needed: ambulated independently  ADL's / Homemaking Assistance Needed: pt vague with level of assistance/independence, appeared confused with the question.  Comments: pt stated she would drive a moped. when asked if she does her own grocery shopping she began to discuss driving to her son's house     Hand Dominance   Dominant Hand: Right    Extremity/Trunk Assessment   Upper Extremity Assessment Upper Extremity Assessment: Defer to OT evaluation RUE Deficits / Details: WFL, pt reports hx or breaking her wrist LUE Deficits / Details: sensation intact, L hemiplegia, increased proximal strength, shoulder flexion AROM (with compensatory shoulder hiking and slight abduction) about 70*;minimal elbow flexion, no grip strength this session;decreased functional use, pt attempting to incoorporate LUE in ADL, limited due to hemiplegia LUE Sensation: WNL LUE Coordination: decreased fine motor;decreased gross motor    Lower Extremity Assessment Lower Extremity Assessment: LLE deficits/detail LLE Deficits / Details: Sensation intact with light touch testing. Decreased DF, strength in quads (4-/5), hamstrings (4/5), hip flexor (4-/5)    Cervical / Trunk Assessment Cervical / Trunk Assessment: Other exceptions Cervical / Trunk Exceptions: noted rocking/writhing movements sitting EOB  Communication   Communication: Expressive difficulties  Cognition Arousal/Alertness: Awake/alert Behavior During Therapy: WFL for tasks assessed/performed Overall Cognitive Status: No family/caregiver present to determine baseline cognitive functioning Area of Impairment: Problem solving;Safety/judgement                          Safety/Judgement: Decreased awareness of safety;Decreased awareness of deficits Awareness: Emergent Problem Solving: Slow processing;Requires verbal cues        General Comments General comments (skin integrity, edema, etc.): HR 80s, SpO2 >95% RA throughout session    Exercises     Assessment/Plan    PT Assessment Patient needs continued PT services  PT Problem List Decreased strength;Decreased activity tolerance;Decreased balance;Decreased mobility;Decreased knowledge of use of DME;Decreased safety awareness;Decreased knowledge of precautions;Pain;Decreased coordination;Decreased cognition       PT Treatment Interventions DME instruction;Gait training;Functional mobility training;Therapeutic activities;Therapeutic exercise;Neuromuscular re-education;Cognitive remediation;Patient/family education;Stair training;Balance training    PT Goals (Current goals can be found in the Care Plan section)  Acute Rehab PT Goals Patient Stated Goal: Decrease pain in her L abdomen PT Goal Formulation: With patient Time For Goal Achievement: 10/02/20 Potential to Achieve Goals: Fair    Frequency Min 4X/week   Barriers to discharge        Co-evaluation PT/OT/SLP Co-Evaluation/Treatment: Yes Reason for Co-Treatment: Necessary to address cognition/behavior during functional activity;For patient/therapist safety;To address functional/ADL transfers PT goals addressed during session: Mobility/safety with mobility;Balance;Strengthening/ROM OT goals addressed during session: ADL's and self-care       AM-PAC PT "6 Clicks" Mobility  Outcome Measure Help needed turning from your back to your side while in a flat bed without using bedrails?: A Little Help needed moving from lying on your back to sitting on the side of a flat bed without using bedrails?: Total Help needed moving to and from  a bed to a chair (including a wheelchair)?: Total Help needed standing up from a chair using your arms  (e.g., wheelchair or bedside chair)?: Total Help needed to walk in hospital room?: Total Help needed climbing 3-5 steps with a railing? : Total 6 Click Score: 8    End of Session Equipment Utilized During Treatment: Gait belt Activity Tolerance: Patient tolerated treatment well Patient left: in chair;with call bell/phone within reach;with chair alarm set Nurse Communication: Mobility status PT Visit Diagnosis: Unsteadiness on feet (R26.81);Pain Pain - Right/Left: Left Pain - part of body:  (abdomen)    Time: [id number] PT Time Calculation (min) (ACUTE ONLY): 27 min   Charges:   PT Evaluation $PT Eval Moderate Complexity: 1 Mod         Rolinda Roan, PT, DPT Acute Rehabilitation Services Pager: [phone] Office: [phone]   Thelma Comp 09/18/2020, 12:06 PM

## 2020-09-18 NOTE — Progress Notes (Signed)
[facility] PROGRESS NOTE    Mackenzie Gonzalez  [id number] DOB: Jun 14, 1960 DOA: 09/17/2020 PCP: Dorethea Clan, DO      Brief Narrative:  Mrs. Cholico is a 60 year old F with HIV, smoking, anxiety, bipolar disorder who presented for left-sided weakness.  CT head unremarkable in the ER, CT angiogram showed no large vessel occlusion.  She was transferred for stroke work-up.   Assessment & Plan:  Acute ischemic stroke   -Echocardiogram showed no cardiogenic source of embolism -Carotid imaging unremarkable   -Lipids ordered continue atorvastatin -Aspirin continue aspirin and Plavix for 3 weeks then aspirin alone -Atrial fibrillation: Not present, recommend 30-day cardiac event monitor as outpatient -tPA not given because outside the window -Dysphagia screen ordered in ER -PT eval ordered: recommended CIR -Smoking cessation: Recommended   Bipolar disorder Schizoaffective disorder -Continue citalopram plus quetiapine  -Continue Xanax  Chronic bronchitis No symptoms  HIV - Continue Genvoya  IBS - COntinue linaclotide  GERD -Continue PPI        Disposition: Status is: Inpatient  Remains inpatient appropriate because:Unsafe d/c plan  Dispo: The patient is from: Home              Anticipated d/c is to: CIR              Patient currently is not medically stable to d/c.   Difficult to place patient No       Level of care: Telemetry Medical       MDM: The below labs and imaging reports were reviewed and summarized above.  Medication management as above.    DVT prophylaxis: SCDs Start: 09/17/20 1829  Code Status: FULL Family Communication:              Subjective: Can move her left side.  Has some left-sided abdominal discomfort, is anxious.  Objective: Vitals:   09/18/20 0500 09/18/20 0622 09/18/20 0855 09/18/20 1953  BP:  131/80 (!) 128/94 127/84  Pulse:  (!) 59 74 80  Resp:  '19 13 19  '$ Temp:  97.8 F (36.6 C)  98.5 F (36.9 C) 98.4 F (36.9 C)  TempSrc:  Axillary Oral Oral  SpO2:  96% 97%   Weight: 87.2 kg     Height:        Intake/Output Summary (Last 24 hours) at 09/18/2020 1958 Last data filed at 09/18/2020 0600 Gross per 24 hour  Intake --  Output 250 ml  Net -250 ml   Filed Weights   09/17/20 1716 09/18/20 0500  Weight: 84 kg 87.2 kg    Examination: General appearance:  adult female, alert and in appears anxious, no obvious distress.   HEENT: Anicteric, conjunctiva pink, lids and lashes normal. No nasal deformity, discharge, epistaxis.  Lips moist.   Skin: Warm and dry.  Numerous hypopigmented spots, vitiligo noted Cardiac: RRR, nl S1-S2, no murmurs appreciated.  Capillary refill is brisk.  JVP normale.  No LE edema.  Radial  pulses 2+ and symmetric. Respiratory: Normal respiratory rate and rhythm.  CTAB without rales or wheezes. Abdomen: Abdomen soft.  no TTP. No ascites, distension, hepatosplenomegaly.   MSK: No deformities or effusions. Neuro: Awake and alert left-sided facial droop noted, left hemiparesis is dense, right-sided movement appears weak as well, speech dysarthric Psych: Sponsor questions, oriented to place and situation but a little bit anxious, distracted, judgment and insight appear impaired     Data Reviewed: I have personally reviewed following labs and imaging studies:  CBC: Recent Labs  Lab 09/17/20 1054 09/18/20 0222  WBC 5.9 4.6  NEUTROABS 3.5  --   HGB 13.4 14.5  HCT 40.4 45.5  MCV 92.9 98.5  PLT 204 [id number]   Basic Metabolic Panel: Recent Labs  Lab 09/17/20 1054 09/18/20 0222  NA 140 137  K 4.1 4.3  CL 108 107  CO2 24 20*  GLUCOSE 95 95  BUN 9 9  CREATININE 0.71 0.72  CALCIUM 9.4 9.3  MG  --  2.1   GFR: Estimated Creatinine Clearance: 82.6 mL/min (by C-G formula based on SCr of 0.72 mg/dL). Liver Function Tests: Recent Labs  Lab 09/18/20 0222  AST 24  ALT 15  ALKPHOS 68  BILITOT 1.2  PROT 6.8  ALBUMIN 3.5   No results for  input(s): LIPASE, AMYLASE in the last 168 hours. No results for input(s): AMMONIA in the last 168 hours. Coagulation Profile: Recent Labs  Lab 09/17/20 1054  INR 0.9   Cardiac Enzymes: No results for input(s): CKTOTAL, CKMB, CKMBINDEX, TROPONINI in the last 168 hours. BNP (last 3 results) No results for input(s): PROBNP in the last 8760 hours. HbA1C: Recent Labs    09/18/20 0222  HGBA1C 5.5   CBG: No results for input(s): GLUCAP in the last 168 hours. Lipid Profile: Recent Labs    09/18/20 0222  CHOL 232*  HDL 36*  LDLCALC 164*  TRIG 161*  CHOLHDL 6.4   Thyroid Function Tests: No results for input(s): TSH, T4TOTAL, FREET4, T3FREE, THYROIDAB in the last 72 hours. Anemia Panel: No results for input(s): VITAMINB12, FOLATE, FERRITIN, TIBC, IRON, RETICCTPCT in the last 72 hours. Urine analysis:    Component Value Date/Time   COLORURINE YELLOW 09/17/2020 2040   APPEARANCEUR CLEAR 09/17/2020 2040   LABSPEC 1.032 (H) 09/17/2020 2040   PHURINE 7.0 09/17/2020 2040   GLUCOSEU NEGATIVE 09/17/2020 2040   HGBUR NEGATIVE 09/17/2020 2040   BILIRUBINUR NEGATIVE 09/17/2020 2040   KETONESUR NEGATIVE 09/17/2020 2040   PROTEINUR NEGATIVE 09/17/2020 2040   NITRITE NEGATIVE 09/17/2020 2040   LEUKOCYTESUR NEGATIVE 09/17/2020 2040   Sepsis Labs: '@LABRCNTIP'$ (procalcitonin:4,lacticacidven:4)  ) Recent Results (from the past 240 hour(s))  Resp Panel by RT-PCR (Flu A&B, Covid) Nasopharyngeal Swab     Status: None   Collection Time: 09/17/20 11:23 AM   Specimen: Nasopharyngeal Swab; Nasopharyngeal(NP) swabs in vial transport medium  Result Value Ref Range Status   SARS Coronavirus 2 by RT PCR NEGATIVE NEGATIVE Final    Comment: (NOTE) SARS-CoV-2 target nucleic acids are NOT DETECTED.  The SARS-CoV-2 RNA is generally detectable in upper respiratory specimens during the acute phase of infection. The lowest concentration of SARS-CoV-2 viral copies this assay can detect is 138  copies/mL. A negative result does not preclude SARS-Cov-2 infection and should not be used as the sole basis for treatment or other patient management decisions. A negative result may occur with  improper specimen collection/handling, submission of specimen other than nasopharyngeal swab, presence of viral mutation(s) within the areas targeted by this assay, and inadequate number of viral copies(<138 copies/mL). A negative result must be combined with clinical observations, patient history, and epidemiological information. The expected result is Negative.  Fact Sheet for Patients:  EntrepreneurPulse.com.au  Fact Sheet for Healthcare Providers:  IncredibleEmployment.be  This test is no t yet approved or cleared by the [place] FDA and  has been authorized for detection and/or diagnosis of SARS-CoV-2 by FDA under an Emergency Use Authorization (EUA). This EUA will remain  in effect (meaning this test can be used)  for the duration of the COVID-19 declaration under Section 564(b)(1) of the Act, 21 U.S.C.section 360bbb-3(b)(1), unless the authorization is terminated  or revoked sooner.       Influenza A by PCR NEGATIVE NEGATIVE Final   Influenza B by PCR NEGATIVE NEGATIVE Final    Comment: (NOTE) The Xpert Xpress SARS-CoV-2/FLU/RSV plus assay is intended as an aid in the diagnosis of influenza from Nasopharyngeal swab specimens and should not be used as a sole basis for treatment. Nasal washings and aspirates are unacceptable for Xpert Xpress SARS-CoV-2/FLU/RSV testing.  Fact Sheet for Patients: EntrepreneurPulse.com.au  Fact Sheet for Healthcare Providers: IncredibleEmployment.be  This test is not yet approved or cleared by the [place] FDA and has been authorized for detection and/or diagnosis of SARS-CoV-2 by FDA under an Emergency Use Authorization (EUA). This EUA will remain in effect (meaning  this test can be used) for the duration of the COVID-19 declaration under Section 564(b)(1) of the Act, 21 U.S.C. section 360bbb-3(b)(1), unless the authorization is terminated or revoked.  Performed at [facility], [address] [place], [place] [address]          Radiology Studies: CT Angio Head W or Wo Contrast  Result Date: 09/17/2020 CLINICAL DATA:  Neuro deficit, acute, stroke suspected EXAM: CT ANGIOGRAPHY HEAD AND NECK TECHNIQUE: Multidetector CT imaging of the head and neck was performed using the standard protocol during bolus administration of intravenous contrast. Multiplanar CT image reconstructions and MIPs were obtained to evaluate the vascular anatomy. Carotid stenosis measurements (when applicable) are obtained utilizing NASCET criteria, using the distal internal carotid diameter as the denominator. CONTRAST:  62m OMNIPAQUE IOHEXOL 350 MG/ML SOLN COMPARISON:  None. FINDINGS: CTA NECK Aortic arch: Great vessel origins are patent. Right carotid system: Patent. Minimal calcified plaque along the proximal internal carotid. No stenosis. Left carotid system: Patent. Minimal calcified plaque along the proximal internal carotid. No stenosis. Vertebral arteries: Patent. Right vertebral artery is dominant. No stenosis. Skeleton: Mild cervical spine degenerative changes, greatest at C5-C6. Other neck: Unremarkable. Upper chest: No apical lung mass. Review of the MIP images confirms the above findings CTA HEAD Anterior circulation: Intracranial internal carotid arteries are patent. Anterior and middle cerebral arteries are patent. Posterior circulation: Intracranial vertebral arteries are patent. Basilar artery is patent. Major cerebellar artery origins are patent. Bilateral posterior communicating arteries are present. Venous sinuses: Patent as allowed by contrast bolus timing. Review of the MIP images confirms the above findings IMPRESSION: No large vessel occlusion,  hemodynamically significant stenosis, or evidence of dissection. Electronically Signed   By: PMacy MisM.D.   On: 09/17/2020 13:02   CT Head Wo Contrast  Result Date: 09/17/2020 CLINICAL DATA:  Pt reports feeling dizzy on Thursday, felt like she almost passed out. Yesterday pt also felt dizzy and then recovered. Today around 9am pt noted slur speech, left side face numb, presents with left facial droop. EXAM: CT HEAD WITHOUT CONTRAST TECHNIQUE: Contiguous axial images were obtained from the base of the skull through the vertex without intravenous contrast. COMPARISON:  06/16/2020. FINDINGS: Brain: No evidence of acute infarction, hemorrhage, hydrocephalus, extra-axial collection or mass lesion/mass effect. Patchy areas of white matter hypoattenuation are noted consistent with mild chronic microvascular ischemic change. Vascular: No hyperdense vessel or unexpected calcification. Skull: Normal. Negative for fracture or focal lesion. Sinuses/Orbits: Globes and orbits are unremarkable. Sinuses are clear. Other: None. IMPRESSION: 1. No acute intracranial abnormalities. 2. Mild chronic microvascular ischemic change. Electronically Signed   By: DLajean Manes  M.D.   On: 09/17/2020 11:37   CT Angio Neck W and/or Wo Contrast  Result Date: 09/17/2020 CLINICAL DATA:  Neuro deficit, acute, stroke suspected EXAM: CT ANGIOGRAPHY HEAD AND NECK TECHNIQUE: Multidetector CT imaging of the head and neck was performed using the standard protocol during bolus administration of intravenous contrast. Multiplanar CT image reconstructions and MIPs were obtained to evaluate the vascular anatomy. Carotid stenosis measurements (when applicable) are obtained utilizing NASCET criteria, using the distal internal carotid diameter as the denominator. CONTRAST:  52m OMNIPAQUE IOHEXOL 350 MG/ML SOLN COMPARISON:  None. FINDINGS: CTA NECK Aortic arch: Great vessel origins are patent. Right carotid system: Patent. Minimal calcified plaque  along the proximal internal carotid. No stenosis. Left carotid system: Patent. Minimal calcified plaque along the proximal internal carotid. No stenosis. Vertebral arteries: Patent. Right vertebral artery is dominant. No stenosis. Skeleton: Mild cervical spine degenerative changes, greatest at C5-C6. Other neck: Unremarkable. Upper chest: No apical lung mass. Review of the MIP images confirms the above findings CTA HEAD Anterior circulation: Intracranial internal carotid arteries are patent. Anterior and middle cerebral arteries are patent. Posterior circulation: Intracranial vertebral arteries are patent. Basilar artery is patent. Major cerebellar artery origins are patent. Bilateral posterior communicating arteries are present. Venous sinuses: Patent as allowed by contrast bolus timing. Review of the MIP images confirms the above findings IMPRESSION: No large vessel occlusion, hemodynamically significant stenosis, or evidence of dissection. Electronically Signed   By: PMacy MisM.D.   On: 09/17/2020 13:02   MR BRAIN WO CONTRAST  Result Date: 09/18/2020 CLINICAL DATA:  Stroke, follow up stroke/tia EXAM: MRI HEAD WITHOUT CONTRAST TECHNIQUE: Multiplanar, multiecho pulse sequences of the brain and surrounding structures were obtained without intravenous contrast. COMPARISON:  CT head and CTA from 09/17/2020 FINDINGS: Brain: Acute infarct in the right paramidline pons (series 3, image 13). Multiple additional small acute infarcts in the right cerebellum. Mild associated edema without mass effect. Mild to moderate scattered additional T2 hyperintensities within the white matter, nonspecific but most likely related to chronic microvascular ischemic disease. No evidence of hydrocephalus, acute hemorrhage, mass lesion, midline shift, or extra-axial fluid collection. Basal cisterns are patent. Vascular: Major arterial flow voids are maintained at the skull base. Please see recent CTA for evaluation of vasculature.  Skull and upper cervical spine: Normal marrow signal. Sinuses/Orbits: Largely clear sinuses.  No acute orbital findings. Other: No sizable mastoid effusions. IMPRESSION: 1. Acute infarcts in the right pons and right cerebellum. Mild associated edema without mass effect. 2. Mild to moderate chronic microvascular ischemic disease. Electronically Signed   By: FMargaretha SheffieldM.D.   On: 09/18/2020 08:37   DG Swallowing Func-Speech Pathology  Result Date: 09/18/2020 Table formatting from the original result was not included. Objective Swallowing Evaluation: Type of Study: MBS-Modified Barium Swallow Study  Patient Details Name: BAREON KAMMERMRN: [id number] of Birth: 6April 10, 1962Today's Date: 09/18/2020 Time: SLP Start Time (ACUTE ONLY): 1422 -SLP Stop Time (ACUTE ONLY): [id number] Time Calculation (min) (ACUTE ONLY): 14 min Past Medical History: Past Medical History: Diagnosis Date  Anxiety   Bipolar 1 disorder ([place]   Chronic bronchitis ([place]   Hepatitis C carrier ([place]   HIV (human immunodeficiency virus infection) ([place] 2015  HIV (human immunodeficiency virus infection) ([place]   Psychiatric disorder   reported h/o schizoaffective, bipolar, anxiety  Schizoaffective disorder ([place]  Past Surgical History: Past Surgical History: Procedure Laterality Date  BIOPSY  06/18/2020  Procedure: BIOPSY;  Surgeon: BThornton Park MD;  [facility];  Service: Gastroenterology;;  BREAST EXCISIONAL BIOPSY Left 1987  benign  ESOPHAGOGASTRODUODENOSCOPY (EGD) WITH PROPOFOL N/A 06/18/2020  Procedure: ESOPHAGOGASTRODUODENOSCOPY (EGD) WITH PROPOFOL;  Surgeon: [name], MD;  Location: [facility];  Service: Gastroenterology;  Laterality: N/A;  I & D EXTREMITY Right 06/16/2020  Procedure: Open debridement of skin subcutaneous tissue and bone associated with open grade 2 fracture right hand;Radiographs 3 views right hand Complex wound closure degloving injury dorsal aspect of the hand greater than 10 cm. Open reduction  and internal fixation right small finger metacarpal shaft ;  Surgeon: [name], MD;  Location: [facility];  Service: Orthopedics;  Laterality: Right;  ORIF WRIST FRACTURE Left 06/16/2020  Procedure: Open reduction internal fixation displaced intra-articular distal radius fracture left wrist 3 more fragments; Radiographs 3 views left wrist Left wrist brachioadialis tendon release, tendon tenotomy;  Surgeon: [name], MD;  Location: [facility];  Service: Orthopedics;  Laterality: Left;  SP ARTHRO WRIST*R*    WRIST SURGERY   HPI: Pt is a 60 yo female presenting with dizziness, L sided weakness, and facial droop. MRI showed acute infarcts in the right pons and right cerebellum. PMH includes: anxiety, bipolar, chronic bronchitis, Hep C, HIV, schizoaffective disorder, smoker  Subjective: pt alert, has slurred speech Assessment / Plan / Recommendation CHL IP CLINICAL IMPRESSIONS 09/18/2020 Clinical Impression Pt presents with functional oropharyngeal swallow although with occasional delay in swallow trigger resulting in flash penetration of thin liquids above and to the vocal cords (PAS 2 & 4), but fully ejected with strength of laryngeal vestibule closure and spontaneous cough response. Penetration observed to be deeper with consecutive sips, improved with small controlled sips via cup or straw. Oral preparation of regular solids was mildly prolonged, however suspect this is due to missing dentition. Bolus cohesion, BOT retraction and pharygneal stripping were adequate resulting in full oropharyngeal clearance of POs, minimal to no residuals noted. Recommend regular/thin liquid diet with use of small controlled sips of liquids via cup or straw. SLP to f/u for tolerance and use of basic strategies. SLP Visit Diagnosis Dysphagia, pharyngeal phase (R13.13) Attention and concentration deficit following -- Frontal lobe and executive function deficit following -- Impact on safety and function Mild aspiration risk   CHL IP  TREATMENT RECOMMENDATION 09/18/2020 Treatment Recommendations Therapy as outlined in treatment plan below   Prognosis 09/18/2020 Prognosis for Safe Diet Advancement Good Barriers to Reach Goals Cognitive deficits Barriers/Prognosis Comment -- CHL IP DIET RECOMMENDATION 09/18/2020 SLP Diet Recommendations Regular solids;Thin liquid Liquid Administration via Straw;Cup Medication Administration Whole meds with liquid Compensations Slow rate;Small sips/bites Postural Changes Seated upright at 90 degrees   CHL IP OTHER RECOMMENDATIONS 09/18/2020 Recommended Consults -- Oral Care Recommendations Oral care BID Other Recommendations --   CHL IP FOLLOW UP RECOMMENDATIONS 09/18/2020 Follow up Recommendations Inpatient Rehab   CHL IP FREQUENCY AND DURATION 09/18/2020 Speech Therapy Frequency (ACUTE ONLY) min 2x/week Treatment Duration 2 weeks      CHL IP ORAL PHASE 09/18/2020 Oral Phase WFL Oral - Pudding Teaspoon -- Oral - Pudding Cup -- Oral - Honey Teaspoon -- Oral - Honey Cup -- Oral - Nectar Teaspoon -- Oral - Nectar Cup -- Oral - Nectar Straw -- Oral - Thin Teaspoon -- Oral - Thin Cup -- Oral - Thin Straw -- Oral - Puree -- Oral - Mech Soft -- Oral - Regular -- Oral - Multi-Consistency -- Oral - Pill -- Oral Phase - Comment --  CHL IP PHARYNGEAL PHASE 09/18/2020 Pharyngeal Phase Impaired Pharyngeal- Pudding Teaspoon --  Pharyngeal -- Pharyngeal- Pudding Cup -- Pharyngeal -- Pharyngeal- Honey Teaspoon -- Pharyngeal -- Pharyngeal- Honey Cup -- Pharyngeal -- Pharyngeal- Nectar Teaspoon -- Pharyngeal -- Pharyngeal- Nectar Cup -- Pharyngeal -- Pharyngeal- Nectar Straw -- Pharyngeal -- Pharyngeal- Thin Teaspoon -- Pharyngeal -- Pharyngeal- Thin Cup Reduced airway/laryngeal closure;Penetration/Aspiration during swallow Pharyngeal Material enters airway, remains ABOVE vocal cords then ejected out Pharyngeal- Thin Straw Reduced airway/laryngeal closure;Penetration/Aspiration during swallow Pharyngeal Material enters airway, CONTACTS cords  and then ejected out;Material enters airway, remains ABOVE vocal cords then ejected out Pharyngeal- Puree -- Pharyngeal -- Pharyngeal- Mechanical Soft -- Pharyngeal -- Pharyngeal- Regular -- Pharyngeal -- Pharyngeal- Multi-consistency -- Pharyngeal -- Pharyngeal- Pill -- Pharyngeal -- Pharyngeal Comment --  CHL IP CERVICAL ESOPHAGEAL PHASE 09/18/2020 Cervical Esophageal Phase WFL Pudding Teaspoon -- Pudding Cup -- Honey Teaspoon -- Honey Cup -- Nectar Teaspoon -- Nectar Cup -- Nectar Straw -- Thin Teaspoon -- Thin Cup -- Thin Straw -- Puree -- Mechanical Soft -- Regular -- Multi-consistency -- Pill -- Cervical Esophageal Comment -- Ellwood Dense, MA, CCC-SLP Acute Rehabilitation Services Office Number: [phone] Acie Fredrickson 09/18/2020, 3:45 PM              VAS [place] TRANSCRANIAL DOPPLER W BUBBLES  Result Date: 09/18/2020  Transcranial Doppler with Bubble Patient Name:  TYLISA OTTEN  Date of Exam:   09/18/2020 Medical Rec #: [id number]       Accession #:    [id number] Date of Birth: [birth year]       Patient Gender: F Patient Age:   89 years Exam [facility]:  [facility] Procedure:      VAS [place] TRANSCRANIAL [place] Referring Phys: Cornelius Moras XU --------------------------------------------------------------------------------  Indications: Stroke. Comparison Study: no prior Performing Technologist: Archie Patten RVS  Examination Guidelines: A complete evaluation includes B-mode imaging, spectral Doppler, color Doppler, and power Doppler as needed of all accessible portions of each vessel. Bilateral testing is considered an integral part of a complete examination. Limited examinations for reoccurring indications may be performed as noted.  Summary: No HITS at rest or during Valsalva. Negative transcranial Doppler Bubble study with no evidence of right to left intracardiac communication.  *See table(s) above for TCD measurements and observations.    Preliminary    ECHOCARDIOGRAM COMPLETE BUBBLE  STUDY  Result Date: 09/18/2020    ECHOCARDIOGRAM REPORT   Patient Name:   CHENELL DULONG Date of Exam: 09/18/2020 Medical Rec #:  [id number]      Height:       65.0 in Accession #:    [id number]     Weight:       192.2 lb Date of Birth:  04/24/1960      BSA:          1.945 m Patient Age:    20 years       BP:           128/94 mmHg Patient Gender: F              HR:           74 bpm. Exam Location:  Inpatient Procedure: 2D Echo, Cardiac Doppler, Color Doppler and [place] Contrast Bubble            Study Indications:    CVA  History:        Patient has no prior history of Echocardiogram examinations.                 COPD, Arrythmias:Abnormal EKG; Risk  Factors:Current Smoker. Hep.                 C, HIV, Substance abuse, Schzophrenia.  Sonographer:    Dustin Flock RDCS Referring Phys: [id number] Rhetta Mura  Sonographer Comments: Technically difficult study due to poor echo windows. Image acquisition challenging due to uncooperative patient. IMPRESSIONS  1. Left ventricular ejection fraction, by estimation, is 55 to 60%. The left ventricle has normal function. The left ventricle has no regional wall motion abnormalities. There is mild left ventricular hypertrophy. Left ventricular diastolic parameters are indeterminate.  2. Right ventricular systolic function is normal. The right ventricular size is normal. There is normal pulmonary artery systolic pressure. The estimated right ventricular systolic pressure is 0000000 mmHg.  3. The mitral valve is normal in structure. No evidence of mitral valve regurgitation. No evidence of mitral stenosis.  4. The aortic valve was not well visualized. Aortic valve regurgitation is not visualized. No aortic stenosis is present.  5. The inferior vena cava is normal in size with greater than 50% respiratory variability, suggesting right atrial pressure of 3 mmHg.  6. Agitated saline contrast bubble study was negative, with no evidence of any interatrial shunt. Technically difficult  bubble study, but no shunting seen FINDINGS  Left Ventricle: Left ventricular ejection fraction, by estimation, is 55 to 60%. The left ventricle has normal function. The left ventricle has no regional wall motion abnormalities. The left ventricular internal cavity size was normal in size. There is  mild left ventricular hypertrophy. Left ventricular diastolic parameters are indeterminate. Right Ventricle: The right ventricular size is normal. No increase in right ventricular wall thickness. Right ventricular systolic function is normal. There is normal pulmonary artery systolic pressure. The tricuspid regurgitant velocity is 2.16 m/s, and  with an assumed right atrial pressure of 3 mmHg, the estimated right ventricular systolic pressure is 0000000 mmHg. Left Atrium: Left atrial size was normal in size. Right Atrium: Right atrial size was normal in size. Pericardium: There is no evidence of pericardial effusion. Presence of pericardial fat pad. Mitral Valve: The mitral valve is normal in structure. No evidence of mitral valve regurgitation. No evidence of mitral valve stenosis. Tricuspid Valve: The tricuspid valve is normal in structure. Tricuspid valve regurgitation is trivial. Aortic Valve: The aortic valve was not well visualized. Aortic valve regurgitation is not visualized. No aortic stenosis is present. Pulmonic Valve: The pulmonic valve was not well visualized. Pulmonic valve regurgitation is not visualized. Aorta: The aortic root is normal in size and structure. Venous: The inferior vena cava is normal in size with greater than 50% respiratory variability, suggesting right atrial pressure of 3 mmHg. IAS/Shunts: The interatrial septum was not well visualized. Agitated saline contrast was given intravenously to evaluate for intracardiac shunting. Agitated saline contrast bubble study was negative, with no evidence of any interatrial shunt.  LEFT VENTRICLE PLAX 2D LVIDd:         4.70 cm  Diastology LVIDs:          3.30 cm  LV e' medial:    5.00 cm/s LV PW:         1.00 cm  LV E/e' medial:  13.8 LV IVS:        1.00 cm  LV e' lateral:   6.85 cm/s LVOT diam:     1.70 cm  LV E/e' lateral: 10.1 LV SV:         40 LV SV Index:   21 LVOT Area:     2.27  cm  RIGHT VENTRICLE RV Basal diam:  1.50 cm RV S prime:     10.20 cm/s TAPSE (M-mode): 0.9 cm LEFT ATRIUM           Index       RIGHT ATRIUM          Index LA diam:      2.90 cm 1.49 cm/m  RA Area:     7.03 cm LA Vol (A2C): 33.4 ml 17.17 ml/m RA Volume:   9.78 ml  5.03 ml/m LA Vol (A4C): 23.4 ml 12.03 ml/m  AORTIC VALVE LVOT Vmax:   90.10 cm/s LVOT Vmean:  62.300 cm/s LVOT VTI:    0.176 m  AORTA Ao Root diam: 3.00 cm MITRAL VALVE               TRICUSPID VALVE MV Area (PHT): 3.51 cm    TR Peak grad:   18.7 mmHg MV Decel Time: 216 msec    TR Vmax:        216.00 cm/s MV E velocity: 69.00 cm/s MV A velocity: 72.80 cm/s  SHUNTS MV E/A ratio:  0.95        Systemic VTI:  0.18 m                            Systemic Diam: 1.70 cm Oswaldo Milian MD Electronically signed by Oswaldo Milian MD Signature Date/Time: 09/18/2020/4:35:29 PM    Final         Scheduled Meds:  aspirin  81 mg Oral Daily   Or   aspirin  300 mg Rectal Daily   atorvastatin  20 mg Oral Daily   citalopram  20 mg Oral Daily   clopidogrel  75 mg Oral Daily   elvitegravir-cobicistat-emtricitabine-tenofovir  1 tablet Oral Q breakfast   linaclotide  145 mcg Oral QAC breakfast   pantoprazole (PROTONIX) IV  40 mg Intravenous Q12H   QUEtiapine  300 mg Oral QHS   vitamin B-12  1,000 mcg Oral Daily   Continuous Infusions:   LOS: 0 days    Time spent: 35 minutes    Edwin Dada, MD [facility] 09/18/2020, 7:58 PM     Please page though [facility] or Epic secure chat:  For [employer], Adult nurse

## 2020-09-19 DIAGNOSIS — R29810 Facial weakness: Secondary | ICD-10-CM | POA: Diagnosis not present

## 2020-09-19 DIAGNOSIS — B20 Human immunodeficiency virus [HIV] disease: Secondary | ICD-10-CM | POA: Diagnosis not present

## 2020-09-19 DIAGNOSIS — Z720 Tobacco use: Secondary | ICD-10-CM | POA: Diagnosis not present

## 2020-09-19 LAB — GLUCOSE, CAPILLARY: Glucose-Capillary: 110 mg/dL — ABNORMAL HIGH (ref 70–99)

## 2020-09-19 MED ORDER — LINACLOTIDE 145 MCG PO CAPS: 145.0000 ug | ORAL_CAPSULE | Freq: Every day | ORAL | Status: DC

## 2020-09-19 MED ORDER — BICTEGRAVIR-EMTRICITAB-TENOFOV 50-200-25 MG PO TABS
1.0000 | ORAL_TABLET | Freq: Every day | ORAL | Status: DC
  Administered 2020-09-19 – 2020-09-21 (×3): 1 via ORAL
  Filled 2020-09-19 (×4): qty 1

## 2020-09-19 MED ORDER — POLYETHYLENE GLYCOL 3350 17 G PO PACK: 17.0000 g | PACK | Freq: Two times a day (BID) | ORAL | Status: DC | PRN

## 2020-09-19 MED ORDER — ATORVASTATIN CALCIUM 80 MG PO TABS
80.0000 mg | ORAL_TABLET | Freq: Every day | ORAL | Status: DC
  Administered 2020-09-19 – 2020-09-21 (×3): 80 mg via ORAL
  Filled 2020-09-19 (×3): qty 1

## 2020-09-19 MED ORDER — ALPRAZOLAM 0.5 MG PO TABS
0.5000 mg | ORAL_TABLET | Freq: Three times a day (TID) | ORAL | Status: DC | PRN
  Administered 2020-09-19 – 2020-09-21 (×3): 0.5 mg via ORAL
  Filled 2020-09-19 (×3): qty 1

## 2020-09-19 MED ORDER — LACTATED RINGERS IV BOLUS
500.0000 mL | Freq: Once | INTRAVENOUS | Status: AC
  Administered 2020-09-19: 500 mL via INTRAVENOUS

## 2020-09-19 MED ORDER — BISACODYL 5 MG PO TBEC: 5.0000 mg | DELAYED_RELEASE_TABLET | Freq: Every day | ORAL | Status: DC | PRN

## 2020-09-19 MED ORDER — PSYLLIUM 95 % PO PACK
1.0000 | PACK | Freq: Every day | ORAL | Status: DC
  Administered 2020-09-19 – 2020-09-21 (×3): 1 via ORAL
  Filled 2020-09-19 (×3): qty 1

## 2020-09-19 MED ORDER — DICYCLOMINE HCL 10 MG PO CAPS
10.0000 mg | ORAL_CAPSULE | Freq: Three times a day (TID) | ORAL | Status: DC | PRN
  Filled 2020-09-19: qty 1

## 2020-09-19 MED ORDER — LACTATED RINGERS IV SOLN: INTRAVENOUS | Status: DC

## 2020-09-19 NOTE — Plan of Care (Signed)

## 2020-09-19 NOTE — Progress Notes (Signed)
[facility] PROGRESS NOTE    Mackenzie Gonzalez  [id number] DOB: [birth year] DOA: 09/17/2020 PCP: Dorethea Clan, DO      Brief Narrative:  Mackenzie Gonzalez is a 60 year old F with HIV, smoking, anxiety, bipolar disorder who presented for left-sided weakness.  CT head unremarkable in the ER, CT angiogram showed no large vessel occlusion.  She was transferred for stroke work-up.        Assessment & Plan:  Acute ischemic stroke Admitted for stroke work-up.  MRI brain showed acute infarcts in the right pons and right cerebellum with mild associated edema and no mass-effect.  Invasive angiography showed no large vessel occlusion or significant stenosis.  Echocardiogram showed no cardiogenic source of embolism Carotid imaging unremarkable   Transcranial doppler unremarkable  Lipids ordered, continue atorvastatin  Plan for continuing aspirin and Plavix for 3 weeks then aspirin alone indefinitely  Atrial fibrillation, not present on monitor.  We will plan for 30-day event monitor as outpatient  -tPA not given because outside the window -Dysphagia screen ordered in ER -PT eval ordered: recommended CIR -Smoking cessation: Recommended    Hypotension BP soft this morning, mostly due to poor oral intake yesterday. - Start IV fluids - Follow oral intake    HIV Viral load detectable in June.  On Genvoya, which impairs conversion of Plavix to active form -  Stop Genvoya - Start Biktarvy  Bipolar disorder Schizoaffective disorder - Continue citalopram, quetiapine - Continue as needed Xanax   Chronic bronchitis No symptoms     IBS - Continue linaclotide  GERD - Continue PPI        Disposition: Status is: Inpatient  Remains inpatient appropriate because:Unsafe d/c plan  Dispo: The patient is from: Home              Anticipated d/c is to: CIR              Patient currently is not medically stable to d/c.   Difficult to place patient  No       Level of care: Telemetry Medical    Patient was admitted for stroke.  She has been fairly somnolent over the last 24 hours, blood pressure soft this morning although there are no focal signs of infection and I am not suspicious for 1 at this point in time.  IV fluids, monitor oral intake  She will need extensive rehabilitation after discharge     MDM: The below labs and imaging reports were reviewed and summarized above.  Medication management as above.    DVT prophylaxis: SCDs Start: 09/17/20 1829  Code Status: FULL Family Communication:              Subjective: Fairly somnolent this morning, sleepy.  No fever, no respiratory distress.  No concerns of pain expressed to nursing.  Objective: Vitals:   09/18/20 2335 09/19/20 0348 09/19/20 0705 09/19/20 0744  BP: (!) 105/56 (!) 82/58 (!) 87/60 100/61  Pulse: 66  69 73  Resp: '20  18 17  '$ Temp: 98.4 F (36.9 C) 97.7 F (36.5 C)  97.6 F (36.4 C)  TempSrc: Oral Axillary  Oral  SpO2:   97% 99%  Weight:      Height:       No intake or output data in the 24 hours ending 09/19/20 0918  Filed Weights   09/17/20 1716 09/18/20 0500  Weight: 84 kg 87.2 kg    Examination: General appearance:   Adult female, lying in bed, no  acute distress HEENT:     Skin: Vitiligo, skin warm and dry Cardiac: Heart rate normal, regular, no murmurs, no lower extremity edema. Respiratory: Respirations shallow, I do not appreciate rales or wheezes she does not cooperate with exam Abdomen: Abdomen soft, no grimace to palpation, no ascites or distention MSK: No deformities or effusions. Neuro: Sleepy, left facial droop is improved, left hemiparesis is dense, right-sided movement appears normal, speech dysarthric Psych: Sleepy, mostly will not answer questions, but oriented to self and place   Data Reviewed: I have personally reviewed following labs and imaging studies:  CBC: Recent Labs  Lab 09/17/20 1054  09/18/20 0222  WBC 5.9 4.6  NEUTROABS 3.5  --   HGB 13.4 14.5  HCT 40.4 45.5  MCV 92.9 98.5  PLT 204 [id number]   Basic Metabolic Panel: Recent Labs  Lab 09/17/20 1054 09/18/20 0222  NA 140 137  K 4.1 4.3  CL 108 107  CO2 24 20*  GLUCOSE 95 95  BUN 9 9  CREATININE 0.71 0.72  CALCIUM 9.4 9.3  MG  --  2.1   GFR: Estimated Creatinine Clearance: 82.6 mL/min (by C-G formula based on SCr of 0.72 mg/dL). Liver Function Tests: Recent Labs  Lab 09/18/20 0222  AST 24  ALT 15  ALKPHOS 68  BILITOT 1.2  PROT 6.8  ALBUMIN 3.5   No results for input(s): LIPASE, AMYLASE in the last 168 hours. No results for input(s): AMMONIA in the last 168 hours. Coagulation Profile: Recent Labs  Lab 09/17/20 1054  INR 0.9   Cardiac Enzymes: No results for input(s): CKTOTAL, CKMB, CKMBINDEX, TROPONINI in the last 168 hours. BNP (last 3 results) No results for input(s): PROBNP in the last 8760 hours. HbA1C: Recent Labs    09/18/20 0222  HGBA1C 5.5   CBG: Recent Labs  Lab 09/19/20 0739  GLUCAP 110*   Lipid Profile: Recent Labs    09/18/20 0222  CHOL 232*  HDL 36*  LDLCALC 164*  TRIG 161*  CHOLHDL 6.4   Thyroid Function Tests: No results for input(s): TSH, T4TOTAL, FREET4, T3FREE, THYROIDAB in the last 72 hours. Anemia Panel: No results for input(s): VITAMINB12, FOLATE, FERRITIN, TIBC, IRON, RETICCTPCT in the last 72 hours. Urine analysis:    Component Value Date/Time   COLORURINE YELLOW 09/17/2020 2040   APPEARANCEUR CLEAR 09/17/2020 2040   LABSPEC 1.032 (H) 09/17/2020 2040   PHURINE 7.0 09/17/2020 2040   GLUCOSEU NEGATIVE 09/17/2020 2040   HGBUR NEGATIVE 09/17/2020 2040   BILIRUBINUR NEGATIVE 09/17/2020 2040   KETONESUR NEGATIVE 09/17/2020 2040   PROTEINUR NEGATIVE 09/17/2020 2040   NITRITE NEGATIVE 09/17/2020 2040   LEUKOCYTESUR NEGATIVE 09/17/2020 2040   Sepsis Labs: '@LABRCNTIP'$ (procalcitonin:4,lacticacidven:4)  ) Recent Results (from the past 240 hour(s))   Resp Panel by RT-PCR (Flu A&B, Covid) Nasopharyngeal Swab     Status: None   Collection Time: 09/17/20 11:23 AM   Specimen: Nasopharyngeal Swab; Nasopharyngeal(NP) swabs in vial transport medium  Result Value Ref Range Status   SARS Coronavirus 2 by RT PCR NEGATIVE NEGATIVE Final    Comment: (NOTE) SARS-CoV-2 target nucleic acids are NOT DETECTED.  The SARS-CoV-2 RNA is generally detectable in upper respiratory specimens during the acute phase of infection. The lowest concentration of SARS-CoV-2 viral copies this assay can detect is 138 copies/mL. A negative result does not preclude SARS-Cov-2 infection and should not be used as the sole basis for treatment or other patient management decisions. A negative result may occur with  improper specimen collection/handling, submission  of specimen other than nasopharyngeal swab, presence of viral mutation(s) within the areas targeted by this assay, and inadequate number of viral copies(<138 copies/mL). A negative result must be combined with clinical observations, patient history, and epidemiological information. The expected result is Negative.  Fact Sheet for Patients:  EntrepreneurPulse.com.au  Fact Sheet for Healthcare Providers:  IncredibleEmployment.be  This test is no t yet approved or cleared by the [place] FDA and  has been authorized for detection and/or diagnosis of SARS-CoV-2 by FDA under an Emergency Use Authorization (EUA). This EUA will remain  in effect (meaning this test can be used) for the duration of the COVID-19 declaration under Section 564(b)(1) of the Act, 21 U.S.C.section 360bbb-3(b)(1), unless the authorization is terminated  or revoked sooner.       Influenza A by PCR NEGATIVE NEGATIVE Final   Influenza B by PCR NEGATIVE NEGATIVE Final    Comment: (NOTE) The Xpert Xpress SARS-CoV-2/FLU/RSV plus assay is intended as an aid in the diagnosis of influenza from  Nasopharyngeal swab specimens and should not be used as a sole basis for treatment. Nasal washings and aspirates are unacceptable for Xpert Xpress SARS-CoV-2/FLU/RSV testing.  Fact Sheet for Patients: EntrepreneurPulse.com.au  Fact Sheet for Healthcare Providers: IncredibleEmployment.be  This test is not yet approved or cleared by the [place] FDA and has been authorized for detection and/or diagnosis of SARS-CoV-2 by FDA under an Emergency Use Authorization (EUA). This EUA will remain in effect (meaning this test can be used) for the duration of the COVID-19 declaration under Section 564(b)(1) of the Act, 21 U.S.C. section 360bbb-3(b)(1), unless the authorization is terminated or revoked.  Performed at [facility], [address] [place], [place] [address]          Radiology Studies: CT Angio Head W or Wo Contrast  Result Date: 09/17/2020 CLINICAL DATA:  Neuro deficit, acute, stroke suspected EXAM: CT ANGIOGRAPHY HEAD AND NECK TECHNIQUE: Multidetector CT imaging of the head and neck was performed using the standard protocol during bolus administration of intravenous contrast. Multiplanar CT image reconstructions and MIPs were obtained to evaluate the vascular anatomy. Carotid stenosis measurements (when applicable) are obtained utilizing NASCET criteria, using the distal internal carotid diameter as the denominator. CONTRAST:  29m OMNIPAQUE IOHEXOL 350 MG/ML SOLN COMPARISON:  None. FINDINGS: CTA NECK Aortic arch: Great vessel origins are patent. Right carotid system: Patent. Minimal calcified plaque along the proximal internal carotid. No stenosis. Left carotid system: Patent. Minimal calcified plaque along the proximal internal carotid. No stenosis. Vertebral arteries: Patent. Right vertebral artery is dominant. No stenosis. Skeleton: Mild cervical spine degenerative changes, greatest at C5-C6. Other neck: Unremarkable.  Upper chest: No apical lung mass. Review of the MIP images confirms the above findings CTA HEAD Anterior circulation: Intracranial internal carotid arteries are patent. Anterior and middle cerebral arteries are patent. Posterior circulation: Intracranial vertebral arteries are patent. Basilar artery is patent. Major cerebellar artery origins are patent. Bilateral posterior communicating arteries are present. Venous sinuses: Patent as allowed by contrast bolus timing. Review of the MIP images confirms the above findings IMPRESSION: No large vessel occlusion, hemodynamically significant stenosis, or evidence of dissection. Electronically Signed   By: PMacy MisM.D.   On: 09/17/2020 13:02   CT Head Wo Contrast  Result Date: 09/17/2020 CLINICAL DATA:  Pt reports feeling dizzy on Thursday, felt like she almost passed out. Yesterday pt also felt dizzy and then recovered. Today around 9am pt noted slur speech, left side face numb, presents with  left facial droop. EXAM: CT HEAD WITHOUT CONTRAST TECHNIQUE: Contiguous axial images were obtained from the base of the skull through the vertex without intravenous contrast. COMPARISON:  06/16/2020. FINDINGS: Brain: No evidence of acute infarction, hemorrhage, hydrocephalus, extra-axial collection or mass lesion/mass effect. Patchy areas of white matter hypoattenuation are noted consistent with mild chronic microvascular ischemic change. Vascular: No hyperdense vessel or unexpected calcification. Skull: Normal. Negative for fracture or focal lesion. Sinuses/Orbits: Globes and orbits are unremarkable. Sinuses are clear. Other: None. IMPRESSION: 1. No acute intracranial abnormalities. 2. Mild chronic microvascular ischemic change. Electronically Signed   By: Lajean Manes M.D.   On: 09/17/2020 11:37   CT Angio Neck W and/or Wo Contrast  Result Date: 09/17/2020 CLINICAL DATA:  Neuro deficit, acute, stroke suspected EXAM: CT ANGIOGRAPHY HEAD AND NECK TECHNIQUE:  Multidetector CT imaging of the head and neck was performed using the standard protocol during bolus administration of intravenous contrast. Multiplanar CT image reconstructions and MIPs were obtained to evaluate the vascular anatomy. Carotid stenosis measurements (when applicable) are obtained utilizing NASCET criteria, using the distal internal carotid diameter as the denominator. CONTRAST:  56m OMNIPAQUE IOHEXOL 350 MG/ML SOLN COMPARISON:  None. FINDINGS: CTA NECK Aortic arch: Great vessel origins are patent. Right carotid system: Patent. Minimal calcified plaque along the proximal internal carotid. No stenosis. Left carotid system: Patent. Minimal calcified plaque along the proximal internal carotid. No stenosis. Vertebral arteries: Patent. Right vertebral artery is dominant. No stenosis. Skeleton: Mild cervical spine degenerative changes, greatest at C5-C6. Other neck: Unremarkable. Upper chest: No apical lung mass. Review of the MIP images confirms the above findings CTA HEAD Anterior circulation: Intracranial internal carotid arteries are patent. Anterior and middle cerebral arteries are patent. Posterior circulation: Intracranial vertebral arteries are patent. Basilar artery is patent. Major cerebellar artery origins are patent. Bilateral posterior communicating arteries are present. Venous sinuses: Patent as allowed by contrast bolus timing. Review of the MIP images confirms the above findings IMPRESSION: No large vessel occlusion, hemodynamically significant stenosis, or evidence of dissection. Electronically Signed   By: PMacy MisM.D.   On: 09/17/2020 13:02   MR BRAIN WO CONTRAST  Result Date: 09/18/2020 CLINICAL DATA:  Stroke, follow up stroke/tia EXAM: MRI HEAD WITHOUT CONTRAST TECHNIQUE: Multiplanar, multiecho pulse sequences of the brain and surrounding structures were obtained without intravenous contrast. COMPARISON:  CT head and CTA from 09/17/2020 FINDINGS: Brain: Acute infarct in the  right paramidline pons (series 3, image 13). Multiple additional small acute infarcts in the right cerebellum. Mild associated edema without mass effect. Mild to moderate scattered additional T2 hyperintensities within the white matter, nonspecific but most likely related to chronic microvascular ischemic disease. No evidence of hydrocephalus, acute hemorrhage, mass lesion, midline shift, or extra-axial fluid collection. Basal cisterns are patent. Vascular: Major arterial flow voids are maintained at the skull base. Please see recent CTA for evaluation of vasculature. Skull and upper cervical spine: Normal marrow signal. Sinuses/Orbits: Largely clear sinuses.  No acute orbital findings. Other: No sizable mastoid effusions. IMPRESSION: 1. Acute infarcts in the right pons and right cerebellum. Mild associated edema without mass effect. 2. Mild to moderate chronic microvascular ischemic disease. Electronically Signed   By: FMargaretha SheffieldM.D.   On: 09/18/2020 08:37   DG Swallowing Func-Speech Pathology  Result Date: 09/18/2020 Table formatting from the original result was not included. Objective Swallowing Evaluation: Type of Study: MBS-Modified Barium Swallow Study  Patient Details Name: Mackenzie Gonzalez: [id number] of Birth: [id number]'s Date:  09/18/2020 Time: SLP Start Time (ACUTE ONLY): 1422 -SLP Stop Time (ACUTE ONLY): [id number] SLP Time Calculation (min) (ACUTE ONLY): 14 min Past Medical History: Past Medical History: Diagnosis Date  Anxiety   Bipolar 1 disorder ([place])   Chronic bronchitis ([place])   Hepatitis C carrier ([place])   HIV (human immunodeficiency virus infection) ([place]) 2015  HIV (human immunodeficiency virus infection) ([place])   Psychiatric disorder   reported h/o schizoaffective, bipolar, anxiety  Schizoaffective disorder ([place])  Past Surgical History: Past Surgical History: Procedure Laterality Date  BIOPSY  06/18/2020  Procedure: BIOPSY;  Surgeon: [name], MD;  Location: [facility];   Service: Gastroenterology;;  BREAST EXCISIONAL BIOPSY Left 1987  benign  ESOPHAGOGASTRODUODENOSCOPY (EGD) WITH PROPOFOL N/A 06/18/2020  Procedure: ESOPHAGOGASTRODUODENOSCOPY (EGD) WITH PROPOFOL;  Surgeon: [name], MD;  Location: [facility];  Service: Gastroenterology;  Laterality: N/A;  I & D EXTREMITY Right 06/16/2020  Procedure: Open debridement of skin subcutaneous tissue and bone associated with open grade 2 fracture right hand;Radiographs 3 views right hand Complex wound closure degloving injury dorsal aspect of the hand greater than 10 cm. Open reduction and internal fixation right small finger metacarpal shaft ;  Surgeon: [name], MD;  Location: [facility];  Service: Orthopedics;  Laterality: Right;  ORIF WRIST FRACTURE Left 06/16/2020  Procedure: Open reduction internal fixation displaced intra-articular distal radius fracture left wrist 3 more fragments; Radiographs 3 views left wrist Left wrist brachioadialis tendon release, tendon tenotomy;  Surgeon: [name], MD;  Location: [facility];  Service: Orthopedics;  Laterality: Left;  SP ARTHRO WRIST*R*    WRIST SURGERY   HPI: Pt is a 60 yo female presenting with dizziness, L sided weakness, and facial droop. MRI showed acute infarcts in the right pons and right cerebellum. PMH includes: anxiety, bipolar, chronic bronchitis, Hep C, HIV, schizoaffective disorder, smoker  Subjective: pt alert, has slurred speech Assessment / Plan / Recommendation CHL IP CLINICAL IMPRESSIONS 09/18/2020 Clinical Impression Pt presents with functional oropharyngeal swallow although with occasional delay in swallow trigger resulting in flash penetration of thin liquids above and to the vocal cords (PAS 2 & 4), but fully ejected with strength of laryngeal vestibule closure and spontaneous cough response. Penetration observed to be deeper with consecutive sips, improved with small controlled sips via cup or straw. Oral preparation of regular solids was mildly prolonged,  however suspect this is due to missing dentition. Bolus cohesion, BOT retraction and pharygneal stripping were adequate resulting in full oropharyngeal clearance of POs, minimal to no residuals noted. Recommend regular/thin liquid diet with use of small controlled sips of liquids via cup or straw. SLP to f/u for tolerance and use of basic strategies. SLP Visit Diagnosis Dysphagia, pharyngeal phase (R13.13) Attention and concentration deficit following -- Frontal lobe and executive function deficit following -- Impact on safety and function Mild aspiration risk   CHL IP TREATMENT RECOMMENDATION 09/18/2020 Treatment Recommendations Therapy as outlined in treatment plan below   Prognosis 09/18/2020 Prognosis for Safe Diet Advancement Good Barriers to Reach Goals Cognitive deficits Barriers/Prognosis Comment -- CHL IP DIET RECOMMENDATION 09/18/2020 SLP Diet Recommendations Regular solids;Thin liquid Liquid Administration via Straw;Cup Medication Administration Whole meds with liquid Compensations Slow rate;Small sips/bites Postural Changes Seated upright at 90 degrees   CHL IP OTHER RECOMMENDATIONS 09/18/2020 Recommended Consults -- Oral Care Recommendations Oral care BID Other Recommendations --   CHL IP FOLLOW UP RECOMMENDATIONS 09/18/2020 Follow up Recommendations Inpatient Rehab   CHL IP FREQUENCY AND DURATION 09/18/2020 Speech Therapy Frequency (ACUTE ONLY) min 2x/week  Treatment Duration 2 weeks      CHL IP ORAL PHASE 09/18/2020 Oral Phase WFL Oral - Pudding Teaspoon -- Oral - Pudding Cup -- Oral - Honey Teaspoon -- Oral - Honey Cup -- Oral - Nectar Teaspoon -- Oral - Nectar Cup -- Oral - Nectar Straw -- Oral - Thin Teaspoon -- Oral - Thin Cup -- Oral - Thin Straw -- Oral - Puree -- Oral - Mech Soft -- Oral - Regular -- Oral - Multi-Consistency -- Oral - Pill -- Oral Phase - Comment --  CHL IP PHARYNGEAL PHASE 09/18/2020 Pharyngeal Phase Impaired Pharyngeal- Pudding Teaspoon -- Pharyngeal -- Pharyngeal- Pudding Cup --  Pharyngeal -- Pharyngeal- Honey Teaspoon -- Pharyngeal -- Pharyngeal- Honey Cup -- Pharyngeal -- Pharyngeal- Nectar Teaspoon -- Pharyngeal -- Pharyngeal- Nectar Cup -- Pharyngeal -- Pharyngeal- Nectar Straw -- Pharyngeal -- Pharyngeal- Thin Teaspoon -- Pharyngeal -- Pharyngeal- Thin Cup Reduced airway/laryngeal closure;Penetration/Aspiration during swallow Pharyngeal Material enters airway, remains ABOVE vocal cords then ejected out Pharyngeal- Thin Straw Reduced airway/laryngeal closure;Penetration/Aspiration during swallow Pharyngeal Material enters airway, CONTACTS cords and then ejected out;Material enters airway, remains ABOVE vocal cords then ejected out Pharyngeal- Puree -- Pharyngeal -- Pharyngeal- Mechanical Soft -- Pharyngeal -- Pharyngeal- Regular -- Pharyngeal -- Pharyngeal- Multi-consistency -- Pharyngeal -- Pharyngeal- Pill -- Pharyngeal -- Pharyngeal Comment --  CHL IP CERVICAL ESOPHAGEAL PHASE 09/18/2020 Cervical Esophageal Phase WFL Pudding Teaspoon -- Pudding Cup -- Honey Teaspoon -- Honey Cup -- Nectar Teaspoon -- Nectar Cup -- Nectar Straw -- Thin Teaspoon -- Thin Cup -- Thin Straw -- Puree -- Mechanical Soft -- Regular -- Multi-consistency -- Pill -- Cervical Esophageal Comment -- [place] Dense, MA, CCC-SLP Acute Rehabilitation Services Office Number: [phone] Acie Fredrickson 09/18/2020, 3:45 PM              VAS [place] TRANSCRANIAL DOPPLER W BUBBLES  Result Date: 09/18/2020  Transcranial Doppler with Bubble Patient Name:  KENTASIA VANCIL  Date of Exam:   09/18/2020 Medical Rec #: [id number]       Accession #:    [id number] Date of Birth: April 18, 1960       Patient Gender: F Patient Age:   38 years Exam Location[facility]  [facility] Procedure:      VAS [place] TRANSCRANIAL [place] Referring Phys: Cornelius Moras XU --------------------------------------------------------------------------------  Indications: Stroke. Comparison Study: no prior Performing Technologist: Archie Patten RVS  Examination  Guidelines: A complete evaluation includes B-mode imaging, spectral Doppler, color Doppler, and power Doppler as needed of all accessible portions of each vessel. Bilateral testing is considered an integral part of a complete examination. Limited examinations for reoccurring indications may be performed as noted.  Summary: No HITS at rest or during Valsalva. Negative transcranial Doppler Bubble study with no evidence of right to left intracardiac communication.  *See table(s) above for TCD measurements and observations.    Preliminary    ECHOCARDIOGRAM COMPLETE BUBBLE STUDY  Result Date: 09/18/2020    ECHOCARDIOGRAM REPORT   Patient Name:   NALEIGHA BALDERRAMA Date of Exam: 09/18/2020 Medical Rec #:  [id number]      Height:       65.0 in Accession #:    [id number]     Weight:       192.2 lb Date of Birth:  14-Apr-1960      BSA:          1.945 m Patient Age:    44 years       BP:  128/94 mmHg Patient Gender: F              HR:           74 bpm. Exam Location:  Inpatient Procedure: 2D Echo, Cardiac Doppler, Color Doppler and [place]ine Contrast Bubble            Study Indications:    CVA  History:        Patient has no prior history of Echocardiogram examinations.                 COPD, Arrythmias:Abnormal EKG; Risk Factors:Current Smoker. Hep.                 C, HIV, Substance abuse, Schzophrenia.  Sonographer:    Dustin Flock RDCS Referring Phys: [id number] Rhetta Mura  Sonographer Comments: Technically difficult study due to poor echo windows. Image acquisition challenging due to uncooperative patient. IMPRESSIONS  1. Left ventricular ejection fraction, by estimation, is 55 to 60%. The left ventricle has normal function. The left ventricle has no regional wall motion abnormalities. There is mild left ventricular hypertrophy. Left ventricular diastolic parameters are indeterminate.  2. Right ventricular systolic function is normal. The right ventricular size is normal. There is normal pulmonary artery  systolic pressure. The estimated right ventricular systolic pressure is 0000000 mmHg.  3. The mitral valve is normal in structure. No evidence of mitral valve regurgitation. No evidence of mitral stenosis.  4. The aortic valve was not well visualized. Aortic valve regurgitation is not visualized. No aortic stenosis is present.  5. The inferior vena cava is normal in size with greater than 50% respiratory variability, suggesting right atrial pressure of 3 mmHg.  6. Agitated saline contrast bubble study was negative, with no evidence of any interatrial shunt. Technically difficult bubble study, but no shunting seen FINDINGS  Left Ventricle: Left ventricular ejection fraction, by estimation, is 55 to 60%. The left ventricle has normal function. The left ventricle has no regional wall motion abnormalities. The left ventricular internal cavity size was normal in size. There is  mild left ventricular hypertrophy. Left ventricular diastolic parameters are indeterminate. Right Ventricle: The right ventricular size is normal. No increase in right ventricular wall thickness. Right ventricular systolic function is normal. There is normal pulmonary artery systolic pressure. The tricuspid regurgitant velocity is 2.16 m/s, and  with an assumed right atrial pressure of 3 mmHg, the estimated right ventricular systolic pressure is 0000000 mmHg. Left Atrium: Left atrial size was normal in size. Right Atrium: Right atrial size was normal in size. Pericardium: There is no evidence of pericardial effusion. Presence of pericardial fat pad. Mitral Valve: The mitral valve is normal in structure. No evidence of mitral valve regurgitation. No evidence of mitral valve stenosis. Tricuspid Valve: The tricuspid valve is normal in structure. Tricuspid valve regurgitation is trivial. Aortic Valve: The aortic valve was not well visualized. Aortic valve regurgitation is not visualized. No aortic stenosis is present. Pulmonic Valve: The pulmonic valve was  not well visualized. Pulmonic valve regurgitation is not visualized. Aorta: The aortic root is normal in size and structure. Venous: The inferior vena cava is normal in size with greater than 50% respiratory variability, suggesting right atrial pressure of 3 mmHg. IAS/Shunts: The interatrial septum was not well visualized. Agitated saline contrast was given intravenously to evaluate for intracardiac shunting. Agitated saline contrast bubble study was negative, with no evidence of any interatrial shunt.  LEFT VENTRICLE PLAX 2D LVIDd:  4.70 cm  Diastology LVIDs:         3.30 cm  LV e' medial:    5.00 cm/s LV PW:         1.00 cm  LV E/e' medial:  13.8 LV IVS:        1.00 cm  LV e' lateral:   6.85 cm/s LVOT diam:     1.70 cm  LV E/e' lateral: 10.1 LV SV:         40 LV SV Index:   21 LVOT Area:     2.27 cm  RIGHT VENTRICLE RV Basal diam:  1.50 cm RV S prime:     10.20 cm/s TAPSE (M-mode): 0.9 cm LEFT ATRIUM           Index       RIGHT ATRIUM          Index LA diam:      2.90 cm 1.49 cm/m  RA Area:     7.03 cm LA Vol (A2C): 33.4 ml 17.17 ml/m RA Volume:   9.78 ml  5.03 ml/m LA Vol (A4C): 23.4 ml 12.03 ml/m  AORTIC VALVE LVOT Vmax:   90.10 cm/s LVOT Vmean:  62.300 cm/s LVOT VTI:    0.176 m  AORTA Ao Root diam: 3.00 cm MITRAL VALVE               TRICUSPID VALVE MV Area (PHT): 3.51 cm    TR Peak grad:   18.7 mmHg MV Decel Time: 216 msec    TR Vmax:        216.00 cm/s MV E velocity: 69.00 cm/s MV A velocity: 72.80 cm/s  SHUNTS MV E/A ratio:  0.95        Systemic VTI:  0.18 m                            Systemic Diam: 1.70 cm Oswaldo Milian MD Electronically signed by Oswaldo Milian MD Signature Date/Time: 09/18/2020/4:35:29 PM    Final         Scheduled Meds:  aspirin  81 mg Oral Daily   Or   aspirin  300 mg Rectal Daily   atorvastatin  80 mg Oral Daily   bictegravir-emtricitabine-tenofovir AF  1 tablet Oral Daily   citalopram  20 mg Oral Daily   clopidogrel  75 mg Oral Daily    linaclotide  145 mcg Oral QAC breakfast   pantoprazole (PROTONIX) IV  40 mg Intravenous Q12H   QUEtiapine  300 mg Oral QHS   vitamin B-12  1,000 mcg Oral Daily   Continuous Infusions:  lactated ringers       LOS: 1 day    Time spent: 25 minutes    Edwin Dada, MD [facility] 09/19/2020, 9:18 AM     Please page though AMION or Epic secure chat:  For [employer], Adult nurse

## 2020-09-19 NOTE — Progress Notes (Signed)
Physical Therapy Treatment Patient Details Name: Mackenzie Gonzalez MRN: [id number] DOB: November 26, 1960 Today's Date: 09/19/2020    History of Present Illness Mackenzie Gonzalez is a 60 y.o. female with PMH significant for anxiety, bipolar, chronic bronchitis, Hep C, HIV, schizoaffective disorder, smoker who presents with dizziness and left facial droop and weakness. 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    PT Comments    Pt checked this morning and was sleeping and somewhat difficult to arouse. In late morning pt awake, restless, and impulsive. Pt required min A to come to EOB and once EOB was rocking back and forth, reports L quadrant pain. +2 mod A needed to stand with L knee blocked. Pt took pivot steps with persistent buckling of L knee as well as impulsivity  of mvmts.  Once in chair, pt not wanting to continue further therapy, only wanting to eat. Assisted with lunch set up and chair alarm on. PT will continue to follow.    Follow Up Recommendations  CIR     Equipment Recommendations  None recommended by PT (TBD by next venue of care)    Recommendations for Other Services Rehab consult     Precautions / Restrictions Precautions Precautions: Fall Restrictions Weight Bearing Restrictions: No    Mobility  Bed Mobility Overal bed mobility: Needs Assistance Bed Mobility: Supine to Sit     Supine to sit: Min assist;HOB elevated     General bed mobility comments: pt needed tactile cues to initiate mvmt but able to bring LE's off bed, min HHA for elevation of trunk, then pt switched from therapist's hand to foot of bed.    Transfers Overall transfer level: Needs assistance Equipment used: 2 person hand held assist Transfers: Sit to/from [facility] Sit to Stand: Mod assist;+2 physical assistance;+2 safety/equipment Stand pivot transfers: Mod assist;+2 physical assistance;+2 safety/equipment       General transfer comment: Mod A +2 for  power up and pivot steps to chair with blocking of L knee due to buckling as well as full support needed for safety due to pt's impulsivity.  Ambulation/Gait             General Gait Details: Unable to progress to gait training this session due to decreased safety as well as pt being focused on wanting her lunch   Stairs             Wheelchair Mobility    Modified Rankin (Stroke Patients Only) Modified Rankin (Stroke Patients Only) Pre-Morbid Rankin Score: No significant disability Modified Rankin: Moderately severe disability     Balance Overall balance assessment: Needs assistance Sitting-balance support: No upper extremity supported;Feet supported Sitting balance-Leahy Scale: Fair Sitting balance - Comments: noted pt to have some rocking/rhythmical movements sitting EOB but maintained balance   Standing balance support: Bilateral upper extremity supported Standing balance-Leahy Scale: Poor Standing balance comment: external support needed due to L knee buckling and poor balance                            Cognition Arousal/Alertness: Awake/alert Behavior During Therapy: Restless Overall Cognitive Status: Impaired/Different from baseline Area of Impairment: Problem solving;Safety/judgement;Awareness                         Safety/Judgement: Decreased awareness of safety;Decreased awareness of deficits Awareness: Emergent Problem Solving: Requires verbal cues;Difficulty sequencing General Comments: pt impulsive and restless  Exercises      General Comments General comments (skin integrity, edema, etc.): VSS      Pertinent Vitals/Pain Pain Assessment: Faces Faces Pain Scale: Hurts little more Pain Location: left abdomen Pain Descriptors / Indicators: Sore Pain Intervention(s): Limited activity within patient's tolerance    Home Living                      Prior Function            PT Goals (current goals can  now be found in the care plan section) Acute Rehab PT Goals Patient Stated Goal: Decrease pain in her L abdomen PT Goal Formulation: With patient Time For Goal Achievement: 10/02/20 Potential to Achieve Goals: Fair Progress towards PT goals: Progressing toward goals    Frequency    Min 4X/week      PT Plan Current plan remains appropriate    Co-evaluation              AM-PAC PT "6 Clicks" Mobility   Outcome Measure  Help needed turning from your back to your side while in a flat bed without using bedrails?: A Little Help needed moving from lying on your back to sitting on the side of a flat bed without using bedrails?: Total Help needed moving to and from a bed to a chair (including a wheelchair)?: Total Help needed standing up from a chair using your arms (e.g., wheelchair or bedside chair)?: Total Help needed to walk in hospital room?: Total Help needed climbing 3-5 steps with a railing? : Total 6 Click Score: 8    End of Session Equipment Utilized During Treatment: Gait belt Activity Tolerance: Patient tolerated treatment well Patient left: in chair;with call bell/phone within reach;with chair alarm set Nurse Communication: Mobility status (feet up after meal) PT Visit Diagnosis: Unsteadiness on feet (R26.81);Pain Pain - Right/Left: Left Pain - part of body:  (abdomen)     Time: [id number] PT Time Calculation (min) (ACUTE ONLY): 14 min  Charges:  $Therapeutic Activity: 8-22 mins                     Leighton Roach, PT  Acute Rehab Services  Pager [phone] Office [place] 09/19/2020, 1:28 PM

## 2020-09-19 NOTE — Progress Notes (Signed)
ID Pharmacy Note   Discussed major drug interaction of clopidogrel and cobicistat with Dr. Loleta Books. Will change [facility] to [facility] for HIV treatment to avoid drug drug interactions.  A message has been sent to Dr. Johnnye Sima, Ms. Pua's primary HIV physician. He will return on 8/25 to review this change.   Atorvastatin was changed back to 80 mg since cobicistat is no longer on the patient's profile to cause a drug interaction.    Jimmy Footman, PharmD, BCPS, BCIDP Infectious Diseases Clinical Pharmacist Phone: [phone] 09/19/2020

## 2020-09-19 NOTE — Progress Notes (Signed)
SLP Cancellation Note  Patient Details Name: Mackenzie Gonzalez MRN: [id number] DOB: 02/13/1960   Cancelled treatment:       Reason Eval/Treat Not Completed: Fatigue/lethargy limiting ability to participate. Discussed with RN who suggests waiting to see pt at a later time. She notes that some of her medications have been changed and she may be more alert later. Will f/u as able.     Osie Bond., M.A. [place] Acute Rehabilitation Services Pager [phone] Office [phone]  09/19/2020, 9:01 AM
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
## 2020-09-20 DIAGNOSIS — R29810 Facial weakness: Secondary | ICD-10-CM | POA: Diagnosis not present

## 2020-09-20 MED ORDER — METHOCARBAMOL 500 MG PO TABS
500.0000 mg | ORAL_TABLET | Freq: Four times a day (QID) | ORAL | Status: DC | PRN
  Administered 2020-09-20: 500 mg via ORAL
  Filled 2020-09-20: qty 1

## 2020-09-20 MED ORDER — NAPROXEN 250 MG PO TABS
500.0000 mg | ORAL_TABLET | Freq: Two times a day (BID) | ORAL | Status: DC
  Administered 2020-09-20: 500 mg via ORAL
  Filled 2020-09-20 (×2): qty 2

## 2020-09-20 NOTE — Progress Notes (Signed)
  Speech Language Pathology Treatment: Dysphagia  Patient Details Name: WAVER EICHSTADT MRN: [id number] DOB: [birth year] Today's Date: 09/20/2020 Time: [id number] SLP Time Calculation (min) (ACUTE ONLY): 14 min  Assessment / Plan / Recommendation Clinical Impression  Pt needed assistance to remember results and recommended strategies from MBS, but after education was provided about the importance of small, single sips, she then implemented this strategy during PO intake with Mod I. Pt still demonstrated coughing episodes, which primarily seemed to correlate with times in which she had solids still in her mouth. She is capable of clearing mild L buccal pocketing but does not always do so. With Min cues to clear her mouth more consistently so as to avoid mixed consistencies, coughing was eliminated. Also note that during MBS coughing was effective at clearing deep laryngeal penetration. Therefore would continue with regular solids and thin liquids, but would still reinforce strategies such as small sips, clearing her mouth consistently, and avoiding mixed consistency boluses.    HPI HPI: Pt is a 60 yo female presenting with dizziness, L sided weakness, and facial droop. MRI showed acute infarcts in the right pons and right cerebellum. PMH includes: anxiety, bipolar, chronic bronchitis, Hep C, HIV, schizoaffective disorder, smoker      SLP Plan  Continue with current plan of care       Recommendations  Diet recommendations: Regular;Thin liquid;Other(comment) (no mixed consistencies) Liquids provided via: Cup;Straw Medication Administration: Whole meds with puree Supervision: Patient able to self feed;Intermittent supervision to cue for compensatory strategies Compensations: Slow rate;Small sips/bites Postural Changes and/or Swallow Maneuvers: Seated upright 90 degrees                Oral Care Recommendations: Oral care BID Follow up Recommendations: Inpatient Rehab SLP Visit Diagnosis:  Dysphagia, pharyngeal phase (R13.13) Plan: Continue with current plan of care       GO                Osie Bond., M.A. [place] Acute Rehabilitation Services Pager [phone] Office [phone]  09/20/2020, 10:36 AM

## 2020-09-20 NOTE — Progress Notes (Signed)
Physical Therapy Treatment Patient Details Name: Mackenzie Gonzalez MRN: [id number] DOB: July 23, 1960 Today's Date: 09/20/2020    History of Present Illness Mackenzie Gonzalez is a 60 y.o. female with PMH significant for anxiety, bipolar, chronic bronchitis, Hep C, HIV, schizoaffective disorder, smoker who presents with dizziness and left facial droop and weakness. 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    PT Comments    Pt progressing towards physical therapy goals. Was able to perform transfers and ambulation with up to min assist, and +2 was available for chair follow/line management during session. Pt motivated to participate and was able to progress to hallway ambulation this date. Provided a L resting hand splint for the walker which improved hand/wrist positioning while attempting to grip the walker. Continue to feel this pt is an excellent candidate for CIR, to maximize functional independence and safety prior to d/c home. Will continue to follow.     Follow Up Recommendations  CIR     Equipment Recommendations  None recommended by PT (TBD by next venue of care)    Recommendations for Other Services Rehab consult     Precautions / Restrictions Precautions Precautions: Fall Precaution Comments: Impulsive at times Restrictions Weight Bearing Restrictions: No    Mobility  Bed Mobility Overal bed mobility: Needs Assistance Bed Mobility: Supine to Sit     Supine to sit: Min assist;HOB elevated     General bed mobility comments: Assist for LLE movement towards EOB. Several multimodal cues required to get pt sitting fully EOB with feet on the floor.    Transfers Overall transfer level: Needs assistance Equipment used: Rolling walker (2 wheeled) Transfers: Sit to/from Stand Sit to Stand: Min assist         General transfer comment: Assist for power-up to full stand with VC's for hand placement on seated surface for safety. Impulsive to  initiate.  Ambulation/Gait Ambulation/Gait assistance: Min assist;+2 safety/equipment Gait Distance (Feet): 60 Feet (x2) Assistive device: Rolling walker (2 wheeled) Gait Pattern/deviations: Step-to pattern;Step-through pattern;Decreased stride length;Trunk flexed;Narrow base of support Gait velocity: Decreased Gait velocity interpretation: <1.8 ft/sec, indicate of risk for recurrent falls General Gait Details: Increased R knee flexion throughout gait cycle, with quick uneven cadence. ~60' ambulation at a time x2. Pt emotional and crying during gait training as she reports being proud that she was walking, and staff was excited to see her in the hallway.   Stairs             Wheelchair Mobility    Modified Rankin (Stroke Patients Only) Modified Rankin (Stroke Patients Only) Pre-Morbid Rankin Score: No significant disability Modified Rankin: Moderately severe disability     Balance Overall balance assessment: Needs assistance Sitting-balance support: No upper extremity supported;Feet supported Sitting balance-Leahy Scale: Fair     Standing balance support: Bilateral upper extremity supported Standing balance-Leahy Scale: Poor Standing balance comment: external support needed due to L knee buckling and poor balance                            Cognition Arousal/Alertness: Awake/alert Behavior During Therapy: Restless;Impulsive Overall Cognitive Status: Impaired/Different from baseline Area of Impairment: Problem solving;Safety/judgement;Awareness                         Safety/Judgement: Decreased awareness of safety;Decreased awareness of deficits Awareness: Emergent Problem Solving: Requires verbal cues;Difficulty sequencing General Comments: Emotional at times - staff excited  to see her walking and she began crying in the hallway      Exercises      General Comments        Pertinent Vitals/Pain Pain Assessment: Faces Faces Pain Scale:  Hurts even more Pain Location: left abdomen Pain Descriptors / Indicators: Sore Pain Intervention(s): Monitored during session;Limited activity within patient's tolerance;Repositioned    Home Living                      Prior Function            PT Goals (current goals can now be found in the care plan section) Acute Rehab PT Goals Patient Stated Goal: Decrease pain in her L abdomen PT Goal Formulation: With patient Time For Goal Achievement: 10/02/20 Potential to Achieve Goals: Fair Progress towards PT goals: Progressing toward goals    Frequency    Min 4X/week      PT Plan Current plan remains appropriate    Co-evaluation              AM-PAC PT "6 Clicks" Mobility   Outcome Measure  Help needed turning from your back to your side while in a flat bed without using bedrails?: A Little Help needed moving from lying on your back to sitting on the side of a flat bed without using bedrails?: Total Help needed moving to and from a bed to a chair (including a wheelchair)?: Total Help needed standing up from a chair using your arms (e.g., wheelchair or bedside chair)?: Total Help needed to walk in hospital room?: Total Help needed climbing 3-5 steps with a railing? : Total 6 Click Score: 8    End of Session Equipment Utilized During Treatment: Gait belt Activity Tolerance: Patient tolerated treatment well Patient left: in chair;with call bell/phone within reach;with chair alarm set Nurse Communication: Mobility status PT Visit Diagnosis: Unsteadiness on feet (R26.81);Pain Pain - Right/Left: Left Pain - part of body:  (abdomen)     Time: [id number] PT Time Calculation (min) (ACUTE ONLY): 29 min  Charges:  $Gait Training: 23-37 mins                     Mackenzie Gonzalez, PT, DPT Acute Rehabilitation Services Pager: [phone] Office: [phone]    Thelma Comp 09/20/2020, 3:00 PM
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
## 2020-09-20 NOTE — Progress Notes (Signed)
Inpatient Rehabilitation Admissions Coordinator   I met with patient at bedside and with her permission spoke with her sister by phone. Both are in agreement to Cir admit and I am hopeful for bed available tomorrow. I have updated acute team and TOC. I will follow up in the morning.  Danne Baxter, RN, MSN Rehab Admissions Coordinator [phone] 09/20/2020 3:25 PM

## 2020-09-20 NOTE — Progress Notes (Signed)
Occupational Therapy Treatment Patient Details Name: Mackenzie Gonzalez MRN: [id number] DOB: [birth year] Today's Date: 09/20/2020    History of present illness Mackenzie Gonzalez is a 60 y.o. female with PMH significant for anxiety, bipolar, chronic bronchitis, Hep C, HIV, schizoaffective disorder, smoker who presents with dizziness and left facial droop and weakness. 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   OT comments  Pt received in bed, agreeable to OT session. Pt required minA-modA for functional mobility at RW level. Pt limited with standing tolerance secondary to LLE weakness and knee buckling. Pt completed grooming while seated in recliner at sink level, she required minA and use of RUE for buddy hand. Pt will continue to benefit from skilled OT services to maximize safety and independence with ADL/IADL and functional mobility. Will continue to follow acutely and progress as tolerated.    Follow Up Recommendations  CIR    Equipment Recommendations  3 in 1 bedside commode    Recommendations for Other Services      Precautions / Restrictions Precautions Precautions: Fall Precaution Comments: Impulsive at times Restrictions Weight Bearing Restrictions: No       Mobility Bed Mobility Overal bed mobility: Needs Assistance Bed Mobility: Supine to Sit     Supine to sit: HOB elevated;Min guard     General bed mobility comments: minguard for safety, no physical assistance provided this date    Transfers Overall transfer level: Needs assistance Equipment used: Rolling walker (2 wheeled) Transfers: Sit to/from [facility] Sit to Stand: Min assist Stand pivot transfers: Mod assist       General transfer comment: minA to powerup into standing, modA for stability and cues for sequencing    Balance Overall balance assessment: Needs assistance Sitting-balance support: No upper extremity supported;Feet supported Sitting balance-Leahy  Scale: Fair Sitting balance - Comments: noted pt to have some rocking/rhythmical movements sitting EOB but maintained balance   Standing balance support: Bilateral upper extremity supported Standing balance-Leahy Scale: Poor Standing balance comment: external support needed due to L knee buckling and poor balance                           ADL either performed or assessed with clinical judgement   ADL Overall ADL's : Needs assistance/impaired Eating/Feeding: Set up;Sitting   Grooming: Minimal assistance;Sitting Grooming Details (indicate cue type and reason): at sink level, encouraged pt to utilize LUE during task, pt used RUE to support and guide LUE with movements             Lower Body Dressing: Sit to/from stand;Maximal assistance Lower Body Dressing Details (indicate cue type and reason): maxA with 1 person assistance to pull pants over hips Toilet Transfer: Moderate assistance;Stand-pivot;RW Toilet Transfer Details (indicate cue type and reason): simulated from EOB to recliner Toileting- Clothing Manipulation and Hygiene: Sit to/from stand;Maximal assistance Toileting - Clothing Manipulation Details (indicate cue type and reason): for clothing management as pt reliant on BUE support on RW     Functional mobility during ADLs: Moderate assistance;+2 for physical assistance;+2 for safety/equipment;Rolling walker General ADL Comments: pt limited by decreased stability, decreased activity tolerance, L hemiplegia     Vision       Perception     Praxis      Cognition Arousal/Alertness: Awake/alert Behavior During Therapy: Restless;Impulsive Overall Cognitive Status: Impaired/Different from baseline Area of Impairment: Problem solving;Safety/judgement;Awareness  Safety/Judgement: Decreased awareness of safety;Decreased awareness of deficits Awareness: Emergent Problem Solving: Requires verbal cues;Difficulty  sequencing General Comments: left inattention, requires max cues to incorporate LUE in ADL;pt impulsive and decreased awareness of deficits and of increased need for assistance        Exercises     Shoulder Instructions       General Comments VSS    Pertinent Vitals/ Pain       Pain Assessment: Faces Faces Pain Scale: Hurts even more Pain Location: left abdomen Pain Descriptors / Indicators: Sore Pain Intervention(s): Limited activity within patient's tolerance  Home Living   Living Arrangements:  (lives with sister, Neoma Laming) Available Help at Discharge: Available 24 hours/day (sister works from home except on Monday and Friday)                     Bathroom Accessibility: Yes How Accessible: Accessible via walker        Lives With: Family (sister, Neoma Laming)    Prior Functioning/Environment          Comments: Independent with ambulation and adls per sister   Frequency  Min 2X/week        Progress Toward Goals  OT Goals(current goals can now be found in the care plan section)  Progress towards OT goals: Progressing toward goals  Acute Rehab OT Goals Patient Stated Goal: Decrease pain in her L abdomen OT Goal Formulation: With patient Time For Goal Achievement: 10/02/20 Potential to Achieve Goals: Good ADL Goals Pt Will Perform Grooming: standing;with min assist Pt Will Perform Lower Body Dressing: with min guard assist;sit to/from stand Pt Will Transfer to Toilet: with min guard assist;ambulating Pt/caregiver will Perform Home Exercise Program: Increased strength;Left upper extremity;Independently;Increased ROM  Plan Discharge plan remains appropriate    Co-evaluation                 AM-PAC OT "6 Clicks" Daily Activity     Outcome Measure   Help from another person eating meals?: A Little Help from another person taking care of personal grooming?: A Little Help from another person toileting, which includes using toliet, bedpan, or  urinal?: A Lot Help from another person bathing (including washing, rinsing, drying)?: A Lot Help from another person to put on and taking off regular upper body clothing?: A Lot Help from another person to put on and taking off regular lower body clothing?: A Lot 6 Click Score: 14    End of Session Equipment Utilized During Treatment: Gait belt;Rolling walker  OT Visit Diagnosis: Other abnormalities of gait and mobility (R26.89);Muscle weakness (generalized) (M62.81);Other symptoms and signs involving cognitive function;Pain;Hemiplegia and hemiparesis;Unsteadiness on feet (R26.81) Hemiplegia - Right/Left: Left Hemiplegia - dominant/non-dominant: Non-Dominant Hemiplegia - caused by: Cerebral infarction Pain - Right/Left: Left Pain - part of body:  (abdomen and right wrist)   Activity Tolerance Patient tolerated treatment well   Patient Left in chair;with call bell/phone within reach;with chair alarm set   Nurse Communication Mobility status        Time: [id number] OT Time Calculation (min): 26 min  Charges: OT General Charges $OT Visit: 1 Visit OT Treatments $Self Care/Home Management : 23-37 mins  Helene Kelp OTR/L Acute Rehabilitation Services Office: [place] 09/20/2020, 4:30 PM

## 2020-09-20 NOTE — Progress Notes (Signed)
[facility] PROGRESS NOTE    Mackenzie Gonzalez  [id number] DOB: 03-20-1960 DOA: 09/17/2020 PCP: Dorethea Clan, DO      Brief Narrative:  Mrs. Mackenzie Gonzalez is a 60 year old F with HIV, smoking, anxiety, bipolar disorder who presented for left-sided weakness.  CT head unremarkable in the ER, CT angiogram showed no large vessel occlusion.  She was transferred for stroke work-up.        Assessment & Plan:  Acute ischemic stroke Admitted for stroke work-up.  MRI brain showed acute infarcts in the right pons and right cerebellum with mild associated edema and no mass-effect.  Invasive angiography showed no large vessel occlusion or significant stenosis.  Echocardiogram showed no cardiogenic source of embolism Carotid imaging unremarkable   Transcranial doppler unremarkable  Lipids ordered, continue atorvastatin  Plan for continuing aspirin and Plavix for 3 weeks then aspirin alone indefinitely  Atrial fibrillation, not present on monitor.  We will plan for 30-day event monitor as outpatient  -tPA not given because outside the window -Dysphagia screen ordered in ER -PT eval ordered: recommended CIR, likely can go to CIR tomorrow. -Smoking cessation: Recommended    Hypotension BP soft this morning, mostly due to poor oral intake yesterday. - Start IV fluids - Follow oral intake    HIV Viral load detectable in June.  On Genvoya, which impairs conversion of Plavix to active form -  Stop Genvoya - Start Biktarvy  Bipolar disorder Schizoaffective disorder - Continue citalopram, quetiapine - Continue as needed Xanax   Chronic bronchitis No symptoms    IBS - Continue linaclotide  GERD - Continue PPI   Flank pain.  Chronic in nature Reported to have flank pain started in May 2022 after her recent admission for trauma. Work-up unremarkable. At present will utilize nonnarcotic medication. Avoid narcotics per family  request.     Disposition: Status is: Inpatient  Remains inpatient appropriate because:Unsafe d/c plan  Dispo: The patient is from: Home              Anticipated d/c is to: CIR              Patient currently is not medically stable to d/c.   Difficult to place patient No       Level of care: Telemetry Medical   MDM: The below labs and imaging reports were reviewed and summarized above.  Medication management as above.    DVT prophylaxis: SCDs Start: 09/17/20 1829  Code Status: FULL Family Communication:  No family at bedside.  Subjective: Mentation improving.  No nausea no vomiting.  Continues to have some pain in the left side.  Objective: Vitals:   09/20/20 0100 09/20/20 0322 09/20/20 0500 09/20/20 1938  BP:  (!) 152/91  114/85  Pulse: 77 72  71  Resp: '19 18  20  '$ Temp:  98 F (36.7 C)  97.8 F (36.6 C)  TempSrc:  Oral  Oral  SpO2: 96% 98%  97%  Weight:   84.4 kg   Height:        Intake/Output Summary (Last 24 hours) at 09/20/2020 2026 Last data filed at 09/20/2020 1500 Gross per 24 hour  Intake 4230.42 ml  Output 700 ml  Net 3530.42 ml    Filed Weights   09/17/20 1716 09/18/20 0500 09/20/20 0500  Weight: 84 kg 87.2 kg 84.4 kg    Examination: General: Appear in mild distress, no Rash; Oral Mucosa Clear, moist. no Abnormal Neck Mass Or lumps, Conjunctiva normal  Cardiovascular: S1 and S2 Present, no Murmur, Respiratory: good respiratory effort, Bilateral Air entry present and CTA, no Crackles, no wheezes Abdomen: Bowel Sound present, Soft and no tenderness Extremities: no Pedal edema Neurology: alert and oriented to time, place, and person affect appropriate. no new focal deficit left-sided weakness and facial droop Gait not checked due to patient safety concerns    Data Reviewed: I have personally reviewed following labs and imaging studies:  CBC: Recent Labs  Lab 09/17/20 1054 09/18/20 0222  WBC 5.9 4.6  NEUTROABS 3.5  --   HGB 13.4  14.5  HCT 40.4 45.5  MCV 92.9 98.5  PLT 204 [id number]    Basic Metabolic Panel: Recent Labs  Lab 09/17/20 1054 09/18/20 0222  NA 140 137  K 4.1 4.3  CL 108 107  CO2 24 20*  GLUCOSE 95 95  BUN 9 9  CREATININE 0.71 0.72  CALCIUM 9.4 9.3  MG  --  2.1    GFR: Estimated Creatinine Clearance: 81.3 mL/min (by C-G formula based on SCr of 0.72 mg/dL). Liver Function Tests: Recent Labs  Lab 09/18/20 0222  AST 24  ALT 15  ALKPHOS 68  BILITOT 1.2  PROT 6.8  ALBUMIN 3.5    No results for input(s): LIPASE, AMYLASE in the last 168 hours. No results for input(s): AMMONIA in the last 168 hours. Coagulation Profile: Recent Labs  Lab 09/17/20 1054  INR 0.9    Cardiac Enzymes: No results for input(s): CKTOTAL, CKMB, CKMBINDEX, TROPONINI in the last 168 hours. BNP (last 3 results) No results for input(s): PROBNP in the last 8760 hours. HbA1C: Recent Labs    09/18/20 0222  HGBA1C 5.5    CBG: Recent Labs  Lab 09/19/20 0739  GLUCAP 110*    Lipid Profile: Recent Labs    09/18/20 0222  CHOL 232*  HDL 36*  LDLCALC 164*  TRIG 161*  CHOLHDL 6.4    Thyroid Function Tests: No results for input(s): TSH, T4TOTAL, FREET4, T3FREE, THYROIDAB in the last 72 hours. Anemia Panel: No results for input(s): VITAMINB12, FOLATE, FERRITIN, TIBC, IRON, RETICCTPCT in the last 72 hours. Urine analysis:    Component Value Date/Time   COLORURINE YELLOW 09/17/2020 2040   APPEARANCEUR CLEAR 09/17/2020 2040   LABSPEC 1.032 (H) 09/17/2020 2040   PHURINE 7.0 09/17/2020 2040   GLUCOSEU NEGATIVE 09/17/2020 2040   HGBUR NEGATIVE 09/17/2020 2040   BILIRUBINUR NEGATIVE 09/17/2020 2040   KETONESUR NEGATIVE 09/17/2020 2040   PROTEINUR NEGATIVE 09/17/2020 2040   NITRITE NEGATIVE 09/17/2020 2040   LEUKOCYTESUR NEGATIVE 09/17/2020 2040    Recent Results (from the past 240 hour(s))  Resp Panel by RT-PCR (Flu A&B, Covid) Nasopharyngeal Swab     Status: None   Collection Time: 09/17/20 11:23 AM    Specimen: Nasopharyngeal Swab; Nasopharyngeal(NP) swabs in vial transport medium  Result Value Ref Range Status   SARS Coronavirus 2 by RT PCR NEGATIVE NEGATIVE Final    Comment: (NOTE) SARS-CoV-2 target nucleic acids are NOT DETECTED.  The SARS-CoV-2 RNA is generally detectable in upper respiratory specimens during the acute phase of infection. The lowest concentration of SARS-CoV-2 viral copies this assay can detect is 138 copies/mL. A negative result does not preclude SARS-Cov-2 infection and should not be used as the sole basis for treatment or other patient management decisions. A negative result may occur with  improper specimen collection/handling, submission of specimen other than nasopharyngeal swab, presence of viral mutation(s) within the areas targeted by this assay, and inadequate number of  viral copies(<138 copies/mL). A negative result must be combined with clinical observations, patient history, and epidemiological information. The expected result is Negative.  Fact Sheet for Patients:  EntrepreneurPulse.com.au  Fact Sheet for Healthcare Providers:  IncredibleEmployment.be  This test is no t yet approved or cleared by the [place] FDA and  has been authorized for detection and/or diagnosis of SARS-CoV-2 by FDA under an Emergency Use Authorization (EUA). This EUA will remain  in effect (meaning this test can be used) for the duration of the COVID-19 declaration under Section 564(b)(1) of the Act, 21 U.S.C.section 360bbb-3(b)(1), unless the authorization is terminated  or revoked sooner.       Influenza A by PCR NEGATIVE NEGATIVE Final   Influenza B by PCR NEGATIVE NEGATIVE Final    Comment: (NOTE) The Xpert Xpress SARS-CoV-2/FLU/RSV plus assay is intended as an aid in the diagnosis of influenza from Nasopharyngeal swab specimens and should not be used as a sole basis for treatment. Nasal washings and aspirates are  unacceptable for Xpert Xpress SARS-CoV-2/FLU/RSV testing.  Fact Sheet for Patients: EntrepreneurPulse.com.au  Fact Sheet for Healthcare Providers: IncredibleEmployment.be  This test is not yet approved or cleared by the [place] FDA and has been authorized for detection and/or diagnosis of SARS-CoV-2 by FDA under an Emergency Use Authorization (EUA). This EUA will remain in effect (meaning this test can be used) for the duration of the COVID-19 declaration under Section 564(b)(1) of the Act, 21 U.S.C. section 360bbb-3(b)(1), unless the authorization is terminated or revoked.  Performed at [facility], [address], [place], [place] [address]          Radiology Studies: No results found.      Scheduled Meds:  aspirin  81 mg Oral Daily   Or   aspirin  300 mg Rectal Daily   atorvastatin  80 mg Oral Daily   bictegravir-emtricitabine-tenofovir AF  1 tablet Oral Daily   citalopram  20 mg Oral Daily   clopidogrel  75 mg Oral Daily   linaclotide  145 mcg Oral QAC breakfast   naproxen  500 mg Oral BID WC   pantoprazole (PROTONIX) IV  40 mg Intravenous Q12H   psyllium  1 packet Oral Daily   QUEtiapine  300 mg Oral QHS   vitamin B-12  1,000 mcg Oral Daily   Continuous Infusions:  lactated ringers 125 mL/hr at 09/19/20 1410     LOS: 2 days    Time spent: 25 minutes    Berle Mull, MD [facility] 09/20/2020, 8:26 PM     Please page though [place] or Epic secure chat:  For [employer], Adult nurse

## 2020-09-20 NOTE — Progress Notes (Signed)
Inpatient Rehabilitation Admissions Coordinator   I await updated therapy progress to assist with planning rehab dispo needs. I met with patient at bedside as she is up in chair speaking with her Mom by phone.  Danne Baxter, RN, MSN Rehab Admissions Coordinator [phone] 09/20/2020 2:32 PM

## 2020-09-21 ENCOUNTER — Encounter (HOSPITAL_COMMUNITY): Payer: Self-pay | Admitting: Physical Medicine & Rehabilitation

## 2020-09-21 ENCOUNTER — Other Ambulatory Visit: Payer: Self-pay

## 2020-09-21 ENCOUNTER — Inpatient Hospital Stay (HOSPITAL_COMMUNITY)
Admission: RE | Admit: 2020-09-21 | Discharge: 2020-10-04 | DRG: 057 | Disposition: A | Discharge status: Home / Self Care | Payer: Medicaid Other | Source: Intra-hospital | Attending: Physical Medicine & Rehabilitation | Admitting: Physical Medicine & Rehabilitation

## 2020-09-21 DIAGNOSIS — E46 Unspecified protein-calorie malnutrition: Secondary | ICD-10-CM

## 2020-09-21 DIAGNOSIS — E785 Hyperlipidemia, unspecified: Secondary | ICD-10-CM

## 2020-09-21 DIAGNOSIS — K581 Irritable bowel syndrome with constipation: Secondary | ICD-10-CM | POA: Diagnosis not present

## 2020-09-21 DIAGNOSIS — K5730 Diverticulosis of large intestine without perforation or abscess without bleeding: Secondary | ICD-10-CM | POA: Diagnosis present

## 2020-09-21 DIAGNOSIS — Z21 Asymptomatic human immunodeficiency virus [HIV] infection status: Secondary | ICD-10-CM | POA: Diagnosis present

## 2020-09-21 DIAGNOSIS — L80 Vitiligo: Secondary | ICD-10-CM | POA: Diagnosis present

## 2020-09-21 DIAGNOSIS — Z716 Tobacco abuse counseling: Secondary | ICD-10-CM | POA: Diagnosis not present

## 2020-09-21 DIAGNOSIS — Z888 Allergy status to other drugs, medicaments and biological substances status: Secondary | ICD-10-CM | POA: Diagnosis not present

## 2020-09-21 DIAGNOSIS — J449 Chronic obstructive pulmonary disease, unspecified: Secondary | ICD-10-CM | POA: Diagnosis present

## 2020-09-21 DIAGNOSIS — E8809 Other disorders of plasma-protein metabolism, not elsewhere classified: Secondary | ICD-10-CM | POA: Diagnosis present

## 2020-09-21 DIAGNOSIS — I6350 Cerebral infarction due to unspecified occlusion or stenosis of unspecified cerebral artery: Secondary | ICD-10-CM | POA: Diagnosis present

## 2020-09-21 DIAGNOSIS — F259 Schizoaffective disorder, unspecified: Secondary | ICD-10-CM | POA: Diagnosis present

## 2020-09-21 DIAGNOSIS — F17210 Nicotine dependence, cigarettes, uncomplicated: Secondary | ICD-10-CM | POA: Diagnosis present

## 2020-09-21 DIAGNOSIS — Z79899 Other long term (current) drug therapy: Secondary | ICD-10-CM

## 2020-09-21 DIAGNOSIS — K589 Irritable bowel syndrome without diarrhea: Secondary | ICD-10-CM | POA: Diagnosis present

## 2020-09-21 DIAGNOSIS — I69328 Other speech and language deficits following cerebral infarction: Secondary | ICD-10-CM

## 2020-09-21 DIAGNOSIS — I69354 Hemiplegia and hemiparesis following cerebral infarction affecting left non-dominant side: Principal | ICD-10-CM

## 2020-09-21 DIAGNOSIS — K5731 Diverticulosis of large intestine without perforation or abscess with bleeding: Secondary | ICD-10-CM | POA: Diagnosis not present

## 2020-09-21 DIAGNOSIS — F319 Bipolar disorder, unspecified: Secondary | ICD-10-CM

## 2020-09-21 DIAGNOSIS — I69322 Dysarthria following cerebral infarction: Secondary | ICD-10-CM | POA: Diagnosis not present

## 2020-09-21 DIAGNOSIS — R1032 Left lower quadrant pain: Secondary | ICD-10-CM | POA: Diagnosis not present

## 2020-09-21 DIAGNOSIS — B20 Human immunodeficiency virus [HIV] disease: Secondary | ICD-10-CM | POA: Diagnosis not present

## 2020-09-21 DIAGNOSIS — R52 Pain, unspecified: Secondary | ICD-10-CM

## 2020-09-21 DIAGNOSIS — I69392 Facial weakness following cerebral infarction: Secondary | ICD-10-CM | POA: Diagnosis not present

## 2020-09-21 DIAGNOSIS — B182 Chronic viral hepatitis C: Secondary | ICD-10-CM | POA: Diagnosis present

## 2020-09-21 DIAGNOSIS — F419 Anxiety disorder, unspecified: Secondary | ICD-10-CM | POA: Diagnosis present

## 2020-09-21 DIAGNOSIS — R29810 Facial weakness: Secondary | ICD-10-CM | POA: Diagnosis not present

## 2020-09-21 DIAGNOSIS — J410 Simple chronic bronchitis: Secondary | ICD-10-CM

## 2020-09-21 MED ORDER — ALPRAZOLAM 0.5 MG PO TABS
0.5000 mg | ORAL_TABLET | Freq: Three times a day (TID) | ORAL | Status: DC | PRN
  Administered 2020-09-21 – 2020-10-03 (×9): 0.5 mg via ORAL
  Filled 2020-09-21 (×9): qty 1

## 2020-09-21 MED ORDER — CYANOCOBALAMIN 1000 MCG PO TABS: 1000.0000 ug | ORAL_TABLET | Freq: Every day | ORAL | 0 refills | Status: DC

## 2020-09-21 MED ORDER — CLOPIDOGREL BISULFATE 75 MG PO TABS
75.0000 mg | ORAL_TABLET | Freq: Every day | ORAL | 0 refills | Status: DC
Start: 2020-09-22 — End: 2020-10-04

## 2020-09-21 MED ORDER — ACETAMINOPHEN 325 MG PO TABS
650.0000 mg | ORAL_TABLET | Freq: Four times a day (QID) | ORAL | Status: DC | PRN
  Administered 2020-09-21 – 2020-09-26 (×3): 650 mg via ORAL
  Filled 2020-09-21 (×3): qty 2

## 2020-09-21 MED ORDER — ASPIRIN EC 81 MG PO TBEC: 81.0000 mg | DELAYED_RELEASE_TABLET | Freq: Every day | ORAL | 2 refills | Status: DC

## 2020-09-21 MED ORDER — ASPIRIN 81 MG PO CHEW
81.0000 mg | CHEWABLE_TABLET | Freq: Every day | ORAL | Status: DC
  Administered 2020-09-22 – 2020-10-04 (×13): 81 mg via ORAL
  Filled 2020-09-21 (×13): qty 1

## 2020-09-21 MED ORDER — LINACLOTIDE 145 MCG PO CAPS
145.0000 ug | ORAL_CAPSULE | Freq: Every day | ORAL | Status: DC
  Administered 2020-09-22 – 2020-10-04 (×13): 145 ug via ORAL
  Filled 2020-09-21 (×13): qty 1

## 2020-09-21 MED ORDER — BICTEGRAVIR-EMTRICITAB-TENOFOV 50-200-25 MG PO TABS
1.0000 | ORAL_TABLET | Freq: Every day | ORAL | Status: DC
  Administered 2020-09-22 – 2020-10-04 (×13): 1 via ORAL
  Filled 2020-09-21 (×13): qty 1

## 2020-09-21 MED ORDER — ATORVASTATIN CALCIUM 80 MG PO TABS: 80.0000 mg | ORAL_TABLET | Freq: Every day | ORAL | 0 refills | Status: DC

## 2020-09-21 MED ORDER — VITAMIN B-12 1000 MCG PO TABS
1000.0000 ug | ORAL_TABLET | Freq: Every day | ORAL | Status: DC
  Administered 2020-09-22 – 2020-10-04 (×13): 1000 ug via ORAL
  Filled 2020-09-21 (×13): qty 1

## 2020-09-21 MED ORDER — ATORVASTATIN CALCIUM 80 MG PO TABS
80.0000 mg | ORAL_TABLET | Freq: Every day | ORAL | Status: DC
  Administered 2020-09-22 – 2020-10-04 (×13): 80 mg via ORAL
  Filled 2020-09-21 (×13): qty 1

## 2020-09-21 MED ORDER — BISACODYL 5 MG PO TBEC: 5.0000 mg | DELAYED_RELEASE_TABLET | Freq: Every day | ORAL | Status: DC | PRN

## 2020-09-21 MED ORDER — ASPIRIN 300 MG RE SUPP
300.0000 mg | Freq: Every day | RECTAL | Status: DC
  Filled 2020-09-21: qty 1

## 2020-09-21 MED ORDER — PSYLLIUM 95 % PO PACK
1.0000 | PACK | Freq: Every day | ORAL | Status: DC
  Administered 2020-09-22 – 2020-10-04 (×13): 1 via ORAL
  Filled 2020-09-21 (×13): qty 1

## 2020-09-21 MED ORDER — NICOTINE 14 MG/24HR TD PT24: 14.0000 mg | MEDICATED_PATCH | Freq: Every day | TRANSDERMAL | Status: DC | PRN

## 2020-09-21 MED ORDER — NICOTINE 14 MG/24HR TD PT24: 14.0000 mg | MEDICATED_PATCH | Freq: Every day | TRANSDERMAL | 0 refills | Status: DC | PRN

## 2020-09-21 MED ORDER — ALBUTEROL SULFATE (2.5 MG/3ML) 0.083% IN NEBU: 2.5000 mg | INHALATION_SOLUTION | Freq: Four times a day (QID) | RESPIRATORY_TRACT | Status: DC | PRN

## 2020-09-21 MED ORDER — ACETAMINOPHEN 650 MG RE SUPP: 650.0000 mg | Freq: Four times a day (QID) | RECTAL | Status: DC | PRN

## 2020-09-21 MED ORDER — PANTOPRAZOLE SODIUM 40 MG PO TBEC
40.0000 mg | DELAYED_RELEASE_TABLET | Freq: Every day | ORAL | Status: DC
  Administered 2020-09-22 – 2020-10-04 (×13): 40 mg via ORAL
  Filled 2020-09-21 (×13): qty 1

## 2020-09-21 MED ORDER — METHOCARBAMOL 500 MG PO TABS
500.0000 mg | ORAL_TABLET | Freq: Four times a day (QID) | ORAL | Status: DC | PRN
  Administered 2020-09-24 – 2020-10-03 (×12): 500 mg via ORAL
  Filled 2020-09-21 (×12): qty 1

## 2020-09-21 MED ORDER — CLOPIDOGREL BISULFATE 75 MG PO TABS
75.0000 mg | ORAL_TABLET | Freq: Every day | ORAL | Status: DC
  Administered 2020-09-22 – 2020-10-04 (×13): 75 mg via ORAL
  Filled 2020-09-21 (×13): qty 1

## 2020-09-21 MED ORDER — CITALOPRAM HYDROBROMIDE 20 MG PO TABS
20.0000 mg | ORAL_TABLET | Freq: Every day | ORAL | Status: DC
  Administered 2020-09-22 – 2020-10-04 (×13): 20 mg via ORAL
  Filled 2020-09-21 (×13): qty 1

## 2020-09-21 MED ORDER — POLYETHYLENE GLYCOL 3350 17 G PO PACK
17.0000 g | PACK | Freq: Two times a day (BID) | ORAL | Status: DC | PRN
  Administered 2020-09-22: 17 g via ORAL
  Filled 2020-09-21: qty 1

## 2020-09-21 MED ORDER — QUETIAPINE FUMARATE 50 MG PO TABS
300.0000 mg | ORAL_TABLET | Freq: Every day | ORAL | Status: DC
  Administered 2020-09-21 – 2020-10-03 (×13): 300 mg via ORAL
  Filled 2020-09-21: qty 1
  Filled 2020-09-21: qty 2
  Filled 2020-09-21: qty 1
  Filled 2020-09-21: qty 2
  Filled 2020-09-21 (×3): qty 1
  Filled 2020-09-21 (×2): qty 2
  Filled 2020-09-21 (×3): qty 1
  Filled 2020-09-21: qty 2
  Filled 2020-09-21: qty 1

## 2020-09-21 MED ORDER — METHOCARBAMOL 500 MG PO TABS: 500.0000 mg | ORAL_TABLET | Freq: Four times a day (QID) | ORAL | 0 refills | Status: DC | PRN

## 2020-09-21 MED ORDER — DICYCLOMINE HCL 10 MG PO CAPS
10.0000 mg | ORAL_CAPSULE | Freq: Three times a day (TID) | ORAL | Status: DC | PRN
  Administered 2020-09-24 (×2): 10 mg via ORAL
  Filled 2020-09-21 (×4): qty 1

## 2020-09-21 MED ORDER — BICTEGRAVIR-EMTRICITAB-TENOFOV 50-200-25 MG PO TABS: 1.0000 | ORAL_TABLET | Freq: Every day | ORAL | 0 refills | Status: DC
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
## 2020-09-21 NOTE — Progress Notes (Signed)
Physical Therapy Treatment Patient Details Name: Mackenzie Gonzalez MRN: [id number] DOB: 11/02/1960 Today's Date: 09/21/2020    History of Present Illness Mackenzie Gonzalez is a 60 y.o. female with PMH significant for anxiety, bipolar, chronic bronchitis, Hep C, HIV, schizoaffective disorder, smoker who presents with dizziness and left facial droop and weakness. 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    PT Comments    Pt progressing towards physical therapy goals. Motivated to work with PT prior to transfer to CIR this afternoon. She was able to progress to ambulation in the hallway without a chair follow. The walker hand splint continues to be helpful for the L hand and PT put it with pt belongings for transfer to rehab. Will continue to follow and progress as able per POC.    Follow Up Recommendations  CIR     Equipment Recommendations  None recommended by PT (TBD by next venue of care)    Recommendations for Other Services Rehab consult     Precautions / Restrictions Precautions Precautions: Fall Precaution Comments: Impulsive at times Restrictions Weight Bearing Restrictions: No    Mobility  Bed Mobility Overal bed mobility: Needs Assistance Bed Mobility: Supine to Sit;Sit to Supine     Supine to sit: Min assist Sit to supine: Min assist   General bed mobility comments: Pt exiting bed on the L this session, and required assist for trunk elevation to full sitting position. Increased time and effort to initiate. Assist for LE elevation back up into bed at end of session.    Transfers Overall transfer level: Needs assistance Equipment used: Rolling walker (2 wheeled) Transfers: Sit to/from Stand Sit to Stand: Min assist         General transfer comment: Assist for power-up to full standing position as well as to gain/maintain standing balance.  Ambulation/Gait Ambulation/Gait assistance: Min assist Gait Distance (Feet): 100 Feet Assistive  device: Rolling walker (2 wheeled) Gait Pattern/deviations: Step-through pattern;Decreased stride length;Trunk flexed;Narrow base of support;Decreased weight shift to left;Decreased dorsiflexion - left;Decreased stance time - left Gait velocity: Decreased Gait velocity interpretation: 1.31 - 2.62 ft/sec, indicative of limited community ambulator General Gait Details: Increased R knee flexion throughout gait cycle, with quick uneven cadence. ~100' ambulation at a time x2. Impulsive to ambulate and continue ambulating even with cues to stop so therapist could arrange equipment, adjust gown, optimize safety.   Stairs             Wheelchair Mobility    Modified Rankin (Stroke Patients Only) Modified Rankin (Stroke Patients Only) Pre-Morbid Rankin Score: No significant disability Modified Rankin: Moderately severe disability     Balance Overall balance assessment: Needs assistance Sitting-balance support: No upper extremity supported;Feet supported Sitting balance-Leahy Scale: Fair Sitting balance - Comments: noted pt to have some rocking/rhythmical movements sitting EOB but maintained balance   Standing balance support: Bilateral upper extremity supported Standing balance-Leahy Scale: Poor Standing balance comment: external support needed due to L knee buckling and poor balance                            Cognition Arousal/Alertness: Awake/alert Behavior During Therapy: Restless;Impulsive Overall Cognitive Status: Impaired/Different from baseline Area of Impairment: Problem solving;Safety/judgement;Awareness                         Safety/Judgement: Decreased awareness of safety;Decreased awareness of deficits Awareness: Emergent Problem Solving: Requires verbal cues;Difficulty  sequencing General Comments: pt impulsive and decreased awareness of deficits and of increased need for assistance      Exercises      General Comments        Pertinent  Vitals/Pain Pain Assessment: Faces Faces Pain Scale: No hurt    Home Living                      Prior Function            PT Goals (current goals can now be found in the care plan section) Acute Rehab PT Goals Patient Stated Goal: Decrease pain in her L abdomen PT Goal Formulation: With patient Time For Goal Achievement: 10/02/20 Potential to Achieve Goals: Fair Progress towards PT goals: Progressing toward goals    Frequency    Min 4X/week      PT Plan Current plan remains appropriate    Co-evaluation              AM-PAC PT "6 Clicks" Mobility   Outcome Measure  Help needed turning from your back to your side while in a flat bed without using bedrails?: A Little Help needed moving from lying on your back to sitting on the side of a flat bed without using bedrails?: A Little Help needed moving to and from a bed to a chair (including a wheelchair)?: A Little Help needed standing up from a chair using your arms (e.g., wheelchair or bedside chair)?: A Little Help needed to walk in hospital room?: A Little Help needed climbing 3-5 steps with a railing? : Total 6 Click Score: 16    End of Session Equipment Utilized During Treatment: Gait belt Activity Tolerance: Patient tolerated treatment well Patient left: in chair;with call bell/phone within reach;with chair alarm set Nurse Communication: Mobility status PT Visit Diagnosis: Unsteadiness on feet (R26.81);Pain Pain - Right/Left: Left Pain - part of body:  (abdomen)     Time: [id number] PT Time Calculation (min) (ACUTE ONLY): 22 min  Charges:  $Gait Training: 8-22 mins                     Rolinda Roan, PT, DPT Acute Rehabilitation Services Pager: [phone] Office: [phone]    Thelma Comp 09/21/2020, 2:27 PM
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
## 2020-09-21 NOTE — Discharge Instructions (Addendum)
Inpatient Rehab Discharge Instructions  Mackenzie Gonzalez  Cardiology will call you about monitor to wear for 30 days it is usually mailed to you  Call [phone] if you have not heard by next Monday Cardiology will call you to have a follow up appt to review results of monitor.  Sometimes heart arrhythmia may cause stroke   Discharge date and time: No discharge date for patient encounter.   Activities/Precautions/ Functional Status: Activity: activity as tolerated Diet: regular diet Wound Care: Routine skin checks Functional status:  ___ No restrictions     ___ Walk up steps independently ___ 24/7 supervision/assistance   ___ Walk up steps with assistance ___ Intermittent supervision/assistance  ___ Bathe/dress independently ___ Walk with walker     __x_ Bathe/dress with assistance ___ Walk Independently    ___ Shower independently ___ Walk with assistance    ___ Shower with assistance ___ No alcohol     ___ Return to work/school ________  COMMUNITY REFERRALS UPON DISCHARGE:    Home Health:   PT     OT                    Agency: [facility] Phone: [phone]    Medical Equipment/Items Ordered: Conservation officer, nature, Bedside Commode, Producer, television/film/video                                                  Agency/Supplier: [employer]    Special Instructions: No driving smoking or alcohol  STROKE/TIA DISCHARGE INSTRUCTIONS SMOKING Cigarette smoking nearly doubles your risk of having a stroke & is the single most alterable risk factor  If you smoke or have smoked in the last 12 months, you are advised to quit smoking for your health. Most of the excess cardiovascular risk related to smoking disappears within a year of stopping. Ask you doctor about anti-smoking medications [place] Quit Line: 1-800-QUIT NOW Free Smoking Cessation Classes [phone]  CHOLESTEROL Know your levels; limit fat & cholesterol in your diet  Lipid Panel     Component Value Date/Time   CHOL 232 (H) 09/18/2020  0222   TRIG 161 (H) 09/18/2020 0222   HDL 36 (L) 09/18/2020 0222   CHOLHDL 6.4 09/18/2020 0222   VLDL 32 09/18/2020 0222   LDLCALC 164 (H) 09/18/2020 0222   LDLCALC 100 (H) 04/20/2019 1459     Many patients benefit from treatment even if their cholesterol is at goal. Goal: Total Cholesterol (CHOL) less than 160 Goal:  Triglycerides (TRIG) less than 150 Goal:  HDL greater than 40 Goal:  LDL (LDLCALC) less than 100   BLOOD PRESSURE American Stroke Association blood pressure target is less that 120/80 mm/Hg  Your discharge blood pressure is:  BP: 128/90 Monitor your blood pressure Limit your salt and alcohol intake Many individuals will require more than one medication for high blood pressure  DIABETES (A1c is a blood sugar average for last 3 months) Goal HGBA1c is under 7% (HBGA1c is blood sugar average for last 3 months)  Diabetes: No known diagnosis of diabetes    Lab Results  Component Value Date   HGBA1C 5.5 09/18/2020    Your HGBA1c can be lowered with medications, healthy diet, and exercise. Check your blood sugar as directed by your physician Call your physician if you experience unexplained or low blood sugars.  PHYSICAL ACTIVITY/REHABILITATION Goal is 30 minutes at least 4 days per week  Activity: Increase activity slowly, Therapies: Physical Therapy: Home Health Return to work:  Activity decreases your risk of heart attack and stroke and makes your heart stronger.  It helps control your weight and blood pressure; helps you relax and can improve your mood. Participate in a regular exercise program. Talk with your doctor about the best form of exercise for you (dancing, walking, swimming, cycling).  DIET/WEIGHT Goal is to maintain a healthy weight  Your discharge diet is:  Diet Order             Diet Heart Room service appropriate? Yes; Fluid consistency: Thin  Diet effective now                   liquids Your height is:  Height: '5\' 5"'$  (165.1 cm) Your current  weight is: Weight: 82.7 kg Your Body Mass Index (BMI) is:  BMI (Calculated): 30.34 Following the type of diet specifically designed for you will help prevent another stroke. Your goal weight range is:   Your goal Body Mass Index (BMI) is 19-24. Healthy food habits can help reduce 3 risk factors for stroke:  High cholesterol, hypertension, and excess weight.  RESOURCES Stroke/Support Group:  Call [phone]   STROKE EDUCATION PROVIDED/REVIEWED AND GIVEN TO PATIENT Stroke warning signs and symptoms How to activate emergency medical system (call 911). Medications prescribed at discharge. Need for follow-up after discharge. Personal risk factors for stroke. Pneumonia vaccine given: No Flu vaccine given: No My questions have been answered, the writing is legible, and I understand these instructions.  I will adhere to these goals & educational materials that have been provided to me after my discharge from the hospital.       My questions have been answered and I understand these instructions. I will adhere to these goals and the provided educational materials after my discharge from the hospital.  Patient/Caregiver Signature _______________________________ Date __________  Clinician Signature _______________________________________ Date __________  Please bring this form and your medication list with you to all your follow-up doctor's appointments.

## 2020-09-21 NOTE — Progress Notes (Signed)
Report given to CIR

## 2020-09-21 NOTE — Progress Notes (Signed)
Patient arrived to floor with no acute distress noted. Patient oriented to floor, call light in reach, bed in lowest position and bed alarm on.

## 2020-09-21 NOTE — Progress Notes (Signed)
Inpatient Rehabilitation Admissions Coordinator   I met with patient at bedside and she is in agreement to CIR admit today. Bed is available. I have alerted Dr. Pranav Pate, acute team and TOC. I will make the arrangements to admit today.   , RN, MSN Rehab Admissions Coordinator [phone] 09/21/2020 10:56 AM  

## 2020-09-21 NOTE — Progress Notes (Signed)
Inpatient Rehabilitation  Patient information reviewed and entered into eRehab system by Debera Sterba M. Eren Puebla, M.A., CCC/SLP, PPS Coordinator.  Information including medical coding, functional ability and quality indicators will be reviewed and updated through discharge.    

## 2020-09-21 NOTE — Progress Notes (Signed)
Patient ID: Mackenzie Gonzalez, female   DOB: 02-07-1960, 60 y.o.   MRN: [id number] Met with the patient to introduce self and role. Reviewed secondary risk and medications along with therapy schedule and plan of care. Continue to follow along to discharge to address educational needs and collaborate with the team to facilitate preparation for discharge. Margarito Liner

## 2020-09-21 NOTE — Progress Notes (Signed)
Inpatient Rehabilitation Medication Review by a Pharmacist  A complete drug regimen review was completed for this patient to identify any potential clinically significant medication issues.  High Risk Drug Classes Is patient taking? Indication by Medication  Antipsychotic Yes Seroquel, bipolar  Anticoagulant No   Antibiotic No   Opioid No   Antiplatelet Yes Plavix / Aspirin  Hypoglycemics/insulin No   Vasoactive Medication No   Chemotherapy No   Other Yes Biktarvy     Type of Medication Issue Identified Description of Issue Recommendation(s)  Drug Interaction(s) (clinically significant)     Duplicate Therapy     Allergy     No Medication Administration End Date  Stop date in place for Plavix   Incorrect Dose     Additional Drug Therapy Needed     Significant med changes from prior encounter (inform family/care partners about these prior to discharge).    Other       Clinically significant medication issues were identified that warrant physician communication and completion of prescribed/recommended actions by midnight of the next day:  No   Pharmacist comments: No issues  Time spent performing this drug regimen review (minutes):  20 minutes   Tad Moore 09/21/2020 2:42 PM

## 2020-09-22 DIAGNOSIS — I6350 Cerebral infarction due to unspecified occlusion or stenosis of unspecified cerebral artery: Secondary | ICD-10-CM | POA: Diagnosis not present

## 2020-09-22 LAB — COMPREHENSIVE METABOLIC PANEL
ALT: 14 U/L (ref 0–44)
AST: 15 U/L (ref 15–41)
Albumin: 3.2 g/dL — ABNORMAL LOW (ref 3.5–5.0)
Alkaline Phosphatase: 67 U/L (ref 38–126)
Anion gap: 10 (ref 5–15)
BUN: 19 mg/dL (ref 6–20)
CO2: 24 mmol/L (ref 22–32)
Calcium: 9.5 mg/dL (ref 8.9–10.3)
Chloride: 105 mmol/L (ref 98–111)
Creatinine, Ser: 0.88 mg/dL (ref 0.44–1.00)
GFR, Estimated: >60 mL/min (ref >60)
Glucose, Bld: 101 mg/dL — ABNORMAL HIGH (ref 70–99)
Potassium: 4 mmol/L (ref 3.5–5.1)
Sodium: 139 mmol/L (ref 135–145)
Total Bilirubin: 0.8 mg/dL (ref 0.3–1.2)
Total Protein: 6.3 g/dL — ABNORMAL LOW (ref 6.5–8.1)

## 2020-09-22 LAB — CBC WITH DIFFERENTIAL/PLATELET
Abs Immature Granulocytes: 0.01 10*3/uL (ref 0.00–0.07)
Basophils Absolute: 0 10*3/uL (ref 0.0–0.1)
Basophils Relative: 1 %
Eosinophils Absolute: 0.1 10*3/uL (ref 0.0–0.5)
Eosinophils Relative: 3 %
HCT: 37.2 % (ref 36.0–46.0)
Hemoglobin: 12.5 g/dL (ref 12.0–15.0)
Immature Granulocytes: 0 %
Lymphocytes Relative: 42 %
Lymphs Abs: 1.8 10*3/uL (ref 0.7–4.0)
MCH: 31.3 pg (ref 26.0–34.0)
MCHC: 33.6 g/dL (ref 30.0–36.0)
MCV: 93 fL (ref 80.0–100.0)
Monocytes Absolute: 0.5 10*3/uL (ref 0.1–1.0)
Monocytes Relative: 11 %
Neutro Abs: 1.8 10*3/uL (ref 1.7–7.7)
Neutrophils Relative %: 43 %
Platelets: 169 10*3/uL (ref 150–400)
RBC: 4 MIL/uL (ref 3.87–5.11)
RDW: 13.7 % (ref 11.5–15.5)
WBC: 4.2 10*3/uL (ref 4.0–10.5)
nRBC: 0 % (ref 0.0–0.2)

## 2020-09-22 MED ORDER — DOCUSATE SODIUM 100 MG PO CAPS
100.0000 mg | ORAL_CAPSULE | Freq: Two times a day (BID) | ORAL | Status: DC
  Administered 2020-09-22 – 2020-09-25 (×7): 100 mg via ORAL
  Filled 2020-09-22 (×7): qty 1

## 2020-09-22 MED ORDER — SORBITOL 70 % SOLN
30.0000 mL | Status: AC
  Administered 2020-09-22: 30 mL via ORAL
  Filled 2020-09-22: qty 30

## 2020-09-22 NOTE — Evaluation (Signed)
Occupational Therapy Assessment and Plan  Patient Details  Name: Mackenzie Gonzalez MRN: [id number] Date of Birth: 10-26-1960  OT Diagnosis: ataxia, cognitive deficits, hemiplegia affecting non-dominant side, muscle weakness (generalized), and decreased safety awareness, dynamic standing balance Rehab Potential: Rehab Potential (ACUTE ONLY): Good ELOS: 14 to 16 days   Today's Date: 09/22/2020 OT Individual Time: 8315-1761 OT Individual Time Calculation (min): 53 min     Hospital Problem: Principal Problem:   Right pontine cerebrovascular accident [facility])   Past Medical History:  Past Medical History:  Diagnosis Date   Anxiety    Bipolar 1 disorder ([place])    Chronic bronchitis ([place])    Hepatitis C carrier ([place])    HIV (human immunodeficiency virus infection) ([place]) 2015   HIV (human immunodeficiency virus infection) ([place])    Psychiatric disorder    reported h/o schizoaffective, bipolar, anxiety   Schizoaffective disorder ([place])    Past Surgical History:  Past Surgical History:  Procedure Laterality Date   BIOPSY  06/18/2020   Procedure: BIOPSY;  Surgeon: [name], MD;  Location: [place];  Service: Gastroenterology;;   BREAST EXCISIONAL BIOPSY Left 1987   benign   ESOPHAGOGASTRODUODENOSCOPY (EGD) WITH PROPOFOL N/A 06/18/2020   Procedure: ESOPHAGOGASTRODUODENOSCOPY (EGD) WITH PROPOFOL;  Surgeon: [name], MD;  Location: [place];  Service: Gastroenterology;  Laterality: N/A;   I & D EXTREMITY Right 06/16/2020   Procedure: Open debridement of skin subcutaneous tissue and bone associated with open grade 2 fracture right hand;Radiographs 3 views right hand Complex wound closure degloving injury dorsal aspect of the hand greater than 10 cm. Open reduction and internal fixation right small finger metacarpal shaft ;  Surgeon: [name], MD;  Location: [place];  Service: Orthopedics;  Laterality: Right;   ORIF WRIST FRACTURE Left 06/16/2020   Procedure: Open reduction  internal fixation displaced intra-articular distal radius fracture left wrist 3 more fragments; Radiographs 3 views left wrist Left wrist brachioadialis tendon release, tendon tenotomy;  Surgeon: [name], MD;  Location: [place];  Service: Orthopedics;  Laterality: Left;   SP ARTHRO WRIST*R*     WRIST SURGERY      Assessment & Plan Clinical Impression: Patient is a 60 y.o. year old female with medical history significant for HIV 2015, hepatitis C, chronic bronchitis, IBS, chronic tobacco/polysubstance use, anxiety/bipolar disorder.  Per chart review she lives with her sister  1 level home one-step to entry.  Ambulated independently prior to admission.  Her sister works from home except out of the house on Mondays and Fridays.   Presented 09/17/2020 with acute onset of dizziness left facial droop/slurred speech and left-sided weakness.  Cranial CT scan negative for acute changes.  CT angiogram head and neck no large vessel occlusion or significant stenosis.  Patient did not receive tPA.  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  Mild associated edema without mass-effect.  Admission chemistries unremarkable, urine drug screen positive benzos as well as marijuana.  Most recent CD4 count June 22 noted to be 869 while most recent HIV RNA viral load to be 26 December 2019.  Echocardiogram with ejection fraction of 55 to 60% no wall motion abnormalities.  Currently maintained on aspirin 81 mg daily and Plavix 75 mg daily for CVA prophylaxis x3 weeks then aspirin alone.  Plan for 30-day cardiac event monitor.  Patient was initially on Genvoya for HIV which impairs conversion of Plavix to active form thus discontinued and started on Biktarvy.  Tolerating a regular consistency diet.  Therapy evaluations completed due to patient's left-sided weakness and dysarthria was admitted for a comprehensive rehab program.Patient transferred to CIR on 09/21/2020 .    Patient currently requires min with  basic self-care skills secondary to muscle weakness, decreased cardiorespiratoy endurance, impaired timing and sequencing, abnormal tone, unbalanced muscle activation, ataxia, decreased coordination, and decreased motor planning, decreased attention to left and decreased motor planning, decreased awareness, decreased problem solving, decreased safety awareness, and decreased memory, and decreased standing balance, decreased postural control, hemiplegia, and decreased balance strategies.  Prior to hospitalization, patient could complete BADL/IADL with independent .  Patient will benefit from skilled intervention to decrease level of assist with basic self-care skills, increase independence with basic self-care skills, and increase level of independence with iADL prior to discharge home with care partner.  Anticipate patient will require intermittent supervision and follow up outpatient.  OT - End of Session Activity Tolerance: Tolerates 30+ min activity with multiple rests Endurance Deficit: Yes OT Assessment Rehab Potential (ACUTE ONLY): Good OT Barriers to Discharge: Decreased caregiver support OT Patient demonstrates impairments in the following area(s): Balance;Sensory;Skin Integrity;Behavior;Cognition;Endurance;Safety;Perception;Pain OT Basic ADL's Functional Problem(s): Eating;Grooming;Bathing;Dressing;Toileting OT Advanced ADL's Functional Problem(s): Simple Meal Preparation OT Transfers Functional Problem(s): Toilet;Tub/Shower OT Additional Impairment(s): Fuctional Use of Upper Extremity OT Plan OT Intensity: Minimum of 1-2 x/day, 45 to 90 minutes OT Frequency: 5 out of 7 days OT Duration/Estimated Length of Stay: 14 to 16 days OT Treatment/Interventions: Balance/vestibular training;Cognitive remediation/compensation;Community reintegration;Disease mangement/prevention;Functional electrical stimulation;Neuromuscular re-education;Patient/family education;Self Care/advanced ADL  retraining;Splinting/orthotics;Therapeutic Exercise;UE/LE Coordination activities;Wheelchair propulsion/positioning;Visual/perceptual remediation/compensation;UE/LE Strength taining/ROM;Therapeutic Activities;Skin care/wound managment;Psychosocial support;Pain management;DME/adaptive equipment instruction;Functional mobility training;Discharge planning OT Self Feeding Anticipated Outcome(s): mod I OT Basic Self-Care Anticipated Outcome(s): mod I OT Toileting Anticipated Outcome(s): mod I OT Bathroom Transfers Anticipated Outcome(s): mod I, S OT Recommendation Recommendations for Other Services: Neuropsych consult Patient destination: Home Follow Up Recommendations: Outpatient OT Equipment Recommended: To be determined   OT Evaluation Precautions/Restrictions  Precautions Precautions: Fall Precaution Comments: L hemi, decreased safety awareness Restrictions Weight Bearing Restrictions: No General Chart Reviewed: Yes Family/Caregiver Present: No Vital Signs Therapy Vitals Temp: 98.6 F (37 C) Temp Source: Oral Pulse Rate: 69 Resp: 16 BP: 133/75 Patient Position (if appropriate): Lying Oxygen Therapy SpO2: 99 % O2 Device: Room Air Pain Pain Assessment Pain Scale: 0-10 Pain Score: 0-No pain Home Living/Prior Functioning Home Living Family/patient expects to be discharged to:: Private residence Living Arrangements: Other relatives (Lives with sister) Available Help at Discharge: Available 24 hours/day (sister works from home except on Monday and Friday) Type of Home: House Home Access: Stairs to enter [employer] of Steps: 1 Home Layout: One level Bathroom Shower/Tub: Chiropodist: Standard Bathroom Accessibility: Yes Additional Comments: sister is there every day except M/F, states she is alone for most of those days, but otherwise sister present.  Lives With: Family (sister, Neoma Laming) IADL History Homemaking Responsibilities: Yes Meal  Prep Responsibility: Primary Occupation: Unemployed Prior Function Level of Independence: Independent with basic ADLs, Independent with homemaking with ambulation, Independent with gait, Independent with transfers  Able to Take Stairs?: Yes Vision Baseline Vision/History: 1 Wears glasses (bifocals, not at hospital) Ability to See in Adequate Light: 0 Adequate Patient Visual Report: No change from baseline Vision Assessment?: Yes Eye Alignment: Within Functional Limits Ocular Range of Motion: Within Functional Limits Alignment/Gaze Preference: Within Defined Limits Tracking/Visual Pursuits: Able to track stimulus in all quads without difficulty Saccades: Within functional limits Convergence: Within functional limits Visual Fields: No apparent deficits Perception  Perception: Impaired Inattention/Neglect:  Does not attend to left visual field;Does not attend to left side of body Praxis Praxis: Intact Cognition Overall Cognitive Status: Impaired/Different from baseline Arousal/Alertness: Awake/alert Orientation Level: Person;Place;Situation Person: Oriented Place: Oriented Situation: Oriented Year: 2022 Month: August Day of Week: Incorrect Memory: Appears intact Immediate Memory Recall: Sock;Blue;Bed Memory Recall Sock: Without Cue Memory Recall Blue: Without Cue Memory Recall Bed: Without Cue Behaviors: Impulsive Safety/Judgment: Impaired Comments: Slightly impuslive with transfers, not waiting to lock brakes prior to transfer/putting L hand in saddle splint, etc Sensation Sensation Light Touch: Appears Intact Hot/Cold: Appears Intact Proprioception: Impaired by gross assessment Stereognosis: Impaired by gross assessment Additional Comments: reporrts LUE numbness but able to appreciate LT to all digits Coordination Gross Motor Movements are Fluid and Coordinated: No Fine Motor Movements are Fluid and Coordinated: No Coordination and Movement Description: LUE>LLE  hemi Finger Nose Finger Test: Able to grossly complete with open hand, unable to isolate digit to complete Motor  Motor Motor: Hemiplegia Motor - Skilled Clinical Observations: LUE>LLE hemi  Trunk/Postural Assessment  Cervical Assessment Cervical Assessment: Within Functional Limits Thoracic Assessment Thoracic Assessment: Exceptions to [facility] (rounded shoulders) Lumbar Assessment Lumbar Assessment: Exceptions to [facility] (posterior pelvic tilt) Postural Control Postural Control: Deficits on evaluation Righting Reactions: delayed and inaedequate Protective Responses: delayed and inaedequate  Balance Balance Balance Assessed: Yes Static Sitting Balance Static Sitting - Balance Support: Feet supported;No upper extremity supported Static Sitting - Level of Assistance: 5: Stand by assistance Static Sitting - Comment/# of Minutes: S Dynamic Sitting Balance Dynamic Sitting - Balance Support: No upper extremity supported;Feet supported Dynamic Sitting - Level of Assistance: 5: Stand by assistance Dynamic Sitting Balance - Compensations: S Dynamic Sitting - Balance Activities: Reaching for objects;Reaching across midline Sitting balance - Comments: S Static Standing Balance Static Standing - Balance Support: Bilateral upper extremity supported;During functional activity Static Standing - Level of Assistance: 5: Stand by assistance Static Standing - Comment/# of Minutes: CGA Dynamic Standing Balance Dynamic Standing - Balance Support: Bilateral upper extremity supported;During functional activity Dynamic Standing - Level of Assistance: 4: Min assist Extremity/Trunk Assessment RUE Assessment RUE Assessment: Within Functional Limits General Strength Comments: screw pushing skin out R ulnar wrist LUE Assessment LUE Assessment: Exceptions to [facility] Active Range of Motion (AROM) Comments: ~110 degrees shoulder flexion General Strength Comments: 3+/5 in shoulder flexion LUE Body System:  Neuro Brunstrum levels for arm and hand: Arm;Hand Brunstrum level for arm: Stage III Synergy is performed voluntarily Brunstrum level for hand: Stage II Synergy is developing  Care Tool Care Tool Self Care Eating   Eating Assist Level: Set up assist    Oral Care    Oral Care Assist Level: Contact Guard/Toucning assist (standing)    Bathing   Body parts bathed by patient: Left arm;Chest;Abdomen;Front perineal area;Right upper leg;Left upper leg;Face Body parts bathed by helper: Right arm;Buttocks;Right lower leg;Left lower leg   Assist Level: Minimal Assistance - Patient > 75%    Upper Body Dressing(including orthotics)   What is the patient wearing?: Pull over shirt   Assist Level: Moderate Assistance - Patient 50 - 74%    Lower Body Dressing (excluding footwear)   What is the patient wearing?: Underwear/pull up;Pants Assist for lower body dressing: Minimal Assistance - Patient > 75%    Putting on/Taking off footwear   What is the patient wearing?: Non-skid slipper socks Assist for footwear: Total Assistance - Patient < 25%       Care Tool Toileting Toileting activity   Assist for toileting: Minimal Assistance -  Patient > 75%     Care Tool Bed Mobility Roll left and right activity   Roll left and right assist level: Independent    Sit to lying activity   Sit to lying assist level: Supervision/Verbal cueing    Lying to sitting on side of bed activity   Lying to sitting on side of bed assist level: the ability to move from lying on the back to sitting on the side of the bed with no back support.: Supervision/Verbal cueing     Care Tool Transfers Sit to stand transfer   Sit to stand assist level: Minimal Assistance - Patient > 75%    Chair/bed transfer   Chair/bed transfer assist level: Minimal Assistance - Patient > 75%     Toilet transfer   Assist Level: Minimal Assistance - Patient > 75%     Care Tool Cognition  Expression of Ideas and Wants Expression of  Ideas and Wants: 3. Some difficulty - exhibits some difficulty with expressing needs and ideas (e.g, some words or finishing thoughts) or speech is not clear  Understanding Verbal and Non-Verbal Content Understanding Verbal and Non-Verbal Content: 4. Understands (complex and basic) - clear comprehension without cues or repetitions   Memory/Recall Ability Memory/Recall Ability : Current season;That he or she is in a hospital/hospital unit;Staff names and faces   Refer to Care Plan for Long Term Goals  SHORT TERM GOAL WEEK 1 OT Short Term Goal 1 (Week 1): Pt will complete ambulatory toilet transfer with CGA + RW. OT Short Term Goal 2 (Week 1): Pt will completed LBD with CGA. OT Short Term Goal 3 (Week 1): Pt will demonstrate appropriate RW use with ind in 2/3 trials.  Recommendations for other services: Neuropsych   Skilled Therapeutic Intervention ADL ADL Eating: Set up Grooming: Contact guard Where Assessed-Grooming: Standing at sink Upper Body Bathing: Minimal assistance Where Assessed-Upper Body Bathing: Shower Lower Body Bathing: Minimal assistance Where Assessed-Lower Body Bathing: Shower Upper Body Dressing: Moderate assistance Where Assessed-Upper Body Dressing: Sitting at sink Lower Body Dressing: Minimal assistance Where Assessed-Lower Body Dressing: Standing at sink Toileting: Minimal assistance Where Assessed-Toileting: Glass blower/designer: Psychiatric nurse Method: Counselling psychologist: Raised toilet seat;Grab bars Tub/Shower Transfer: Not assessed Social research officer, government: Minimal assistance Social research officer, government Method: Heritage manager: Transfer tub bench Mobility  Bed Mobility Bed Mobility: Supine to Sit;Sit to Supine Supine to Sit: Supervision/Verbal cueing Sit to Supine: Supervision/Verbal cueing Transfers Sit to Stand: Minimal Assistance - Patient > 75% Stand to Sit: Contact Guard/Touching  assist  Session Note: Pt received semi-reclined in bed, c/o "side pain," LPN notified for rx request and present to administer, agreeable to OT eval and req to shower.  Reviewed role of CIR OT, evaluation process, ADL/func mobility retraining, goals for therapy, and safety plan. Evaluation completed as documented above. Came to sitting EOB with close S and increased time with use of bed features. STS with CGA with RW + L saddle splint. Amb > toilet with min A 2/2 poor LLE control/unsafe RW use. CGA during toileting tasks post continent void of bladder. Short amb transfer > TTB with min A and heavy cuing for sequencing and safety. Bathed full-body with min A to bathe buttocks/B feet thoroughly. Req min to mod A to incorporate LUE functionally into tasks, pt with good motivation to utilize LUE. Stand-pivot > w/c with no AD and min HHA. Completed UBD seated at sink with mod A to thread LUE and pull down  over trunk 2/2 poor awareness, donned underwear/pants with min A to pull up over L hip. Completed oral care seated at sink with set-up A of materials and S. Stand-pivot back to bed same manner as before. Pt left semi-reclined  in bed with call bell in reach, bed alarm engaged, and all immediate needs met.   Discharge Criteria: Patient will be discharged from OT if patient refuses treatment 3 consecutive times without medical reason, if treatment goals not met, if there is a change in medical status, if patient makes no progress towards goals or if patient is discharged from hospital.  The above assessment, treatment plan, treatment alternatives and goals were discussed and mutually agreed upon: by patient  Volanda Napoleon MS, OTR/L  09/22/2020, 3:04 PM

## 2020-09-22 NOTE — Discharge Summary (Addendum)
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  Follow-up with neurology as recommended on discharge. Follow up LABS/TEST: 30-day cardiac monitor   Follow-up Information     [facility]. Schedule an appointment as soon as possible for a visit in 1 month(s).   Specialty: Neurology Why: stroke clinic Contact information: [place] [phone]        Dorethea Clan, DO. Schedule an appointment as soon as possible for a visit in 1 week(s).   Specialty: Internal Medicine Contact information: [place] [address] [phone]                Discharge Instructions     Ambulatory referral to Neurology   Complete by: As directed    Follow up with stroke clinic NP (Jessica Vanschaick or Cecille Rubin, if both not available, consider Zachery Dauer, or Ahern) at [facility] in about 4 weeks. Thanks.   Diet - low sodium heart healthy   Complete by: As directed    Increase activity slowly   Complete by: As directed        Diet recommendation: Cardiac diet  Activity: The patient is advised to gradually reintroduce usual activities, as tolerated  Discharge Condition: stable  Code Status: Full code   History of present illness: As per the H and P dictated on admission, "Mackenzie Gonzalez is a 60  y.o. female with medical history significant for HIV, chronic bronchitis, GERD, chronic tobacco abuse, generalized anxiety disorder, bipolar disorder, who is admitted to [facility] on 09/17/2020 by way of transfer from [facility] ED for further evaluation and management of suspected acute ischemic CVA after presenting from home to the latter facility complaining of left-sided weakness.   The patient reports that on Thursday, 09/14/2020, that she developed mild dizziness lasting approximately 1 day before subsequent resolution without recurrence.  Denies any associated vertigo.  Following interval resolution of the dizziness, she subsequently developed sudden onset of left upper extremity and left lower extremity weakness at approximately 1400 on Saturday, 09/16/2020.  She notes that this was associated with numbness involving the left upper extremity.  Then, she woke at 9 AM this morning with new onset left facial droop as well as slurred speech, prompting her to present to [facility] ED for further evaluation of such.  She reports that the left-sided weakness is demonstrated no significant interval improvement since onset, and notes no significant change in her left upper extremity numbness.  She denies any acute weakness involving her right side, continues to deny any associated vertigo.  Also denies any associated acute change in vision.  No prior history of symptoms similar to the above.  Not associate with any recent headache.  Denies any recent trauma.  She also denies any associated chest pain, shortness of breath, palpitations, diaphoresis, syncope.  Denies any previous history of stroke. In terms of modifiable risk factors, she acknowledges that she is a current smoker, having smoked approximately half pack per day over the last 25 years.  Denies any known underlying history of hypertension, hyperlipidemia, diabetes, atrial fibrillation, or obstructive sleep apnea.  She confirms that she is on no  blood thinners as an outpatient, including aspirin.  She also confirms that she is not currently on any statin medications at home.   She denies any recent subjective fever, chills, rigors, generalized myalgias.  No recent dysuria, gross hematuria, change in urinary urgency/frequency.  No recent known COVID-19 exposures.   Per chart review, medical history also notable for HIV, with most recent CD4 count in June 22 noted to be 869, while most recent HIV RNA viral load to be 28 in November 2021.  There is also documentation of a prior history of intravenous cocaine use, although the patient denies any recent recreational drug use of any kind.  Also acknowledges a history of vitiligo."  Hospital Course:  Summary of her active problems in the hospital is as following. Acute right pons and right cerebellum infarct secondary to cardioembolic disease versus small vessel disease. CT head no acute abnormality. CTA negative for any large vessel occlusion. MRI shows acute infarct as mentioned above. Mild associated edema without mass-effect as well. TCD bubble study negative. 30-day cardiac monitor ordered.  Telemetry monitoring in the hospital negative for any atrial arrhythmia. Echocardiogram shows preserved EF without any wall motion abnormality or cardio embolic source. LDL 164.  Currently on Lipitor. Hemoglobin A1c 5.5. Neurology recommended aspirin and Plavix for 3 weeks together with aspirin alone followed by that. tPA was not given as the patient was outside window.. PT OT recommended CIR. Patient passed swallowing eval and currently on regular thin liquid diet.  Active smoker. Counseled the patient to quit smoking. Nicotine patch.  Obesity. Recommend outpatient follow-up with PCP regarding diet and weight loss education.  Flank pain. Appears to be chronic in nature since her fall in May. Extensive evaluation including CT abdomen as well as CT renal negative for any acute abnormality  regarding her kidneys or musculoskeletal abnormality. Recommend muscle relaxant.  Anxiety. Patient is on Xanax at home.  We will continue on discharge.  HIV. Patient is on Genvoya prior to admission. Viral load undetectable. Genvoya impairs conversation of Plavix to active form therefore we will stop Genvoya. Switched to [employer].  Bipolar disorder. Schizoaffective disorder. Continuing home regimen of citalopram, Seroquel.  Chronic bronchitis. Appears to be stable from that perspective  IBS. On Linzess we will continue.  GERD. Continue PPI.  Patient was seen by physical therapy, who recommended CIR. On the day of the discharge the patient's vitals were stable, and no other new acute medical condition were reported. The patient was felt safe to be discharge at [facility] with Therapy.  Consultants: Neurology Procedures: Echocardiogram, TCD  DISCHARGE MEDICATION: Allergies as of 09/21/2020       Reactions   Haldol [haloperidol] Other (See Comments)   Makes jittery        Medication List     STOP taking these medications    gabapentin 400 MG capsule Commonly known as: NEURONTIN   Genvoya 150-150-200-10 MG Tabs tablet Generic drug: elvitegravir-cobicistat-emtricitabine-tenofovir   Metamucil 0.36 g Caps Generic drug: Psyllium   naproxen sodium 220 MG tablet Commonly known as: ALEVE       TAKE these medications    ALPRAZolam 1 MG tablet Commonly known as: Duanne Moron  TAKE 1 TABLET (1 MG TOTAL) BY MOUTH 3 (THREE) TIMES DAILY AS NEEDED FOR ANXIETY. What changed:  how much to take how to take this when to take this reasons to take this   aspirin EC 81 MG tablet Take 1 tablet (81 mg total) by mouth daily. Swallow whole.   atorvastatin 80 MG tablet Commonly known as: LIPITOR Take 1 tablet (80 mg total) by mouth daily.   bictegravir-emtricitabine-tenofovir AF 50-200-25 MG Tabs tablet Commonly known as: BIKTARVY Take 1 tablet by mouth daily.   bisacodyl 10  MG/30ML Enem Commonly known as: FLEET Place 10 mg rectally as needed (constipation).   bisacodyl 5 MG EC tablet Commonly known as: DULCOLAX Take 5 mg by mouth daily as needed for moderate constipation.   citalopram 20 MG tablet Commonly known as: CELEXA TAKE 1 TABLET (20 MG TOTAL) BY MOUTH DAILY. What changed: how much to take   clopidogrel 75 MG tablet Commonly known as: PLAVIX Take 1 tablet (75 mg total) by mouth daily for 20 days.   cyanocobalamin 1000 MCG tablet Take 1 tablet (1,000 mcg total) by mouth daily.   diclofenac Sodium 1 % Gel Commonly known as: VOLTAREN Apply 4 g topically 4 (four) times daily as needed (pain).   dicyclomine 20 MG tablet Commonly known as: BENTYL Take 1 tablet (20 mg total) by mouth 4 (four) times daily as needed for spasms.   fluticasone 50 MCG/ACT nasal spray Commonly known as: FLONASE Place 2 sprays into both nostrils daily as needed for allergies.   Linzess 145 MCG Caps capsule Generic drug: linaclotide Take 1 capsule (145 mcg total) by mouth daily before breakfast.   methocarbamol 500 MG tablet Commonly known as: ROBAXIN Take 1 tablet (500 mg total) by mouth every 6 (six) hours as needed for muscle spasms.   nicotine 14 mg/24hr patch Commonly known as: NICODERM CQ - dosed in mg/24 hours Place 1 patch (14 mg total) onto the skin daily as needed (smoking cessation).   ondansetron 4 MG disintegrating tablet Commonly known as: Zofran ODT Dissole 1 tablet (4 mg total) by mouth every 8 (eight) hours as needed for nausea or vomiting.   pantoprazole 40 MG tablet Commonly known as: PROTONIX Take 1 tablet (40 mg total) by mouth 2 (two) times daily.   polyethylene glycol 17 g packet Commonly known as: MIRALAX / GLYCOLAX Take 17 g by mouth daily. Dissolve 1 capful in 8 ounces of water or juice daily   ProAir HFA 108 (90 Base) MCG/ACT inhaler Generic drug: albuterol Inhale 2 puffs into the lungs every 6 (six) hours as needed for  wheezing or shortness of breath.   QUEtiapine 300 MG tablet Commonly known as: SEROQUEL Take 1 tablet (300 mg total) by mouth at bedtime.   valACYclovir 1000 MG tablet Commonly known as: VALTREX Take 1,000 mg by mouth See admin instructions. Take 1 tablet by mouth three times daily ONLY when having an active break out        Discharge Exam: Filed Weights   09/17/20 1716 09/18/20 0500 09/20/20 0500  Weight: 84 kg 87.2 kg 84.4 kg   Vitals:   09/21/20 0331 09/21/20 1149  BP: 134/90 (!) 135/93  Pulse: 62 65  Resp: 15 16  Temp: 98.3 F (36.8 C) 97.9 F (36.6 C)  SpO2: 98% 98%   General: Appear in mild distress, no Rash; Oral Mucosa Clear, moist. no Abnormal Neck Mass Or lumps, Conjunctiva normal  Cardiovascular: S1 and S2 Present, no Murmur, Respiratory: good  respiratory effort, Bilateral Air entry present and CTA, no Crackles, no wheezes Abdomen: Bowel Sound present, Soft and no tenderness Extremities: no Pedal edema Neurology: alert and oriented to time, place, and person affect appropriate. no new focal deficit, left-sided weakness and left facial droop Gait not checked due to patient safety concerns   The results of significant diagnostics from this hospitalization (including imaging, microbiology, ancillary and laboratory) are listed below for reference.    Significant Diagnostic Studies: CT Angio Head W or Wo Contrast  Result Date: 09/17/2020 CLINICAL DATA:  Neuro deficit, acute, stroke suspected EXAM: CT ANGIOGRAPHY HEAD AND NECK TECHNIQUE: Multidetector CT imaging of the head and neck was performed using the standard protocol during bolus administration of intravenous contrast. Multiplanar CT image reconstructions and MIPs were obtained to evaluate the vascular anatomy. Carotid stenosis measurements (when applicable) are obtained utilizing NASCET criteria, using the distal internal carotid diameter as the denominator. CONTRAST:  73m OMNIPAQUE IOHEXOL 350 MG/ML SOLN  COMPARISON:  None. FINDINGS: CTA NECK Aortic arch: Great vessel origins are patent. Right carotid system: Patent. Minimal calcified plaque along the proximal internal carotid. No stenosis. Left carotid system: Patent. Minimal calcified plaque along the proximal internal carotid. No stenosis. Vertebral arteries: Patent. Right vertebral artery is dominant. No stenosis. Skeleton: Mild cervical spine degenerative changes, greatest at C5-C6. Other neck: Unremarkable. Upper chest: No apical lung mass. Review of the MIP images confirms the above findings CTA HEAD Anterior circulation: Intracranial internal carotid arteries are patent. Anterior and middle cerebral arteries are patent. Posterior circulation: Intracranial vertebral arteries are patent. Basilar artery is patent. Major cerebellar artery origins are patent. Bilateral posterior communicating arteries are present. Venous sinuses: Patent as allowed by contrast bolus timing. Review of the MIP images confirms the above findings IMPRESSION: No large vessel occlusion, hemodynamically significant stenosis, or evidence of dissection. Electronically Signed   By: PMacy MisM.D.   On: 09/17/2020 13:02   CT Head Wo Contrast  Result Date: 09/17/2020 CLINICAL DATA:  Pt reports feeling dizzy on Thursday, felt like she almost passed out. Yesterday pt also felt dizzy and then recovered. Today around 9am pt noted slur speech, left side face numb, presents with left facial droop. EXAM: CT HEAD WITHOUT CONTRAST TECHNIQUE: Contiguous axial images were obtained from the base of the skull through the vertex without intravenous contrast. COMPARISON:  06/16/2020. FINDINGS: Brain: No evidence of acute infarction, hemorrhage, hydrocephalus, extra-axial collection or mass lesion/mass effect. Patchy areas of white matter hypoattenuation are noted consistent with mild chronic microvascular ischemic change. Vascular: No hyperdense vessel or unexpected calcification. Skull: Normal.  Negative for fracture or focal lesion. Sinuses/Orbits: Globes and orbits are unremarkable. Sinuses are clear. Other: None. IMPRESSION: 1. No acute intracranial abnormalities. 2. Mild chronic microvascular ischemic change. Electronically Signed   By: DLajean ManesM.D.   On: 09/17/2020 11:37   CT Angio Neck W and/or Wo Contrast  Result Date: 09/17/2020 CLINICAL DATA:  Neuro deficit, acute, stroke suspected EXAM: CT ANGIOGRAPHY HEAD AND NECK TECHNIQUE: Multidetector CT imaging of the head and neck was performed using the standard protocol during bolus administration of intravenous contrast. Multiplanar CT image reconstructions and MIPs were obtained to evaluate the vascular anatomy. Carotid stenosis measurements (when applicable) are obtained utilizing NASCET criteria, using the distal internal carotid diameter as the denominator. CONTRAST:  729mOMNIPAQUE IOHEXOL 350 MG/ML SOLN COMPARISON:  None. FINDINGS: CTA NECK Aortic arch: Great vessel origins are patent. Right carotid system: Patent. Minimal calcified plaque along the proximal internal carotid.  No stenosis. Left carotid system: Patent. Minimal calcified plaque along the proximal internal carotid. No stenosis. Vertebral arteries: Patent. Right vertebral artery is dominant. No stenosis. Skeleton: Mild cervical spine degenerative changes, greatest at C5-C6. Other neck: Unremarkable. Upper chest: No apical lung mass. Review of the MIP images confirms the above findings CTA HEAD Anterior circulation: Intracranial internal carotid arteries are patent. Anterior and middle cerebral arteries are patent. Posterior circulation: Intracranial vertebral arteries are patent. Basilar artery is patent. Major cerebellar artery origins are patent. Bilateral posterior communicating arteries are present. Venous sinuses: Patent as allowed by contrast bolus timing. Review of the MIP images confirms the above findings IMPRESSION: No large vessel occlusion, hemodynamically  significant stenosis, or evidence of dissection. Electronically Signed   By: Macy Mis M.D.   On: 09/17/2020 13:02   MR BRAIN WO CONTRAST  Result Date: 09/18/2020 CLINICAL DATA:  Stroke, follow up stroke/tia EXAM: MRI HEAD WITHOUT CONTRAST TECHNIQUE: Multiplanar, multiecho pulse sequences of the brain and surrounding structures were obtained without intravenous contrast. COMPARISON:  CT head and CTA from 09/17/2020 FINDINGS: Brain: Acute infarct in the right paramidline pons (series 3, image 13). Multiple additional small acute infarcts in the right cerebellum. Mild associated edema without mass effect. Mild to moderate scattered additional T2 hyperintensities within the white matter, nonspecific but most likely related to chronic microvascular ischemic disease. No evidence of hydrocephalus, acute hemorrhage, mass lesion, midline shift, or extra-axial fluid collection. Basal cisterns are patent. Vascular: Major arterial flow voids are maintained at the skull base. Please see recent CTA for evaluation of vasculature. Skull and upper cervical spine: Normal marrow signal. Sinuses/Orbits: Largely clear sinuses.  No acute orbital findings. Other: No sizable mastoid effusions. IMPRESSION: 1. Acute infarcts in the right pons and right cerebellum. Mild associated edema without mass effect. 2. Mild to moderate chronic microvascular ischemic disease. Electronically Signed   By: Margaretha Sheffield M.D.   On: 09/18/2020 08:37   DG Swallowing Func-Speech Pathology  Result Date: 09/18/2020 Table formatting from the original result was not included. Objective Swallowing Evaluation: Type of Study: MBS-Modified Barium Swallow Study  Patient Details Name: Mackenzie Gonzalez MRN: [id number] Date of Birth: [birth year] Today's Date: 09/18/2020 Time: SLP Start Time (ACUTE ONLY): 1422 -SLP Stop Time (ACUTE ONLY): [id number] SLP Time Calculation (min) (ACUTE ONLY): 14 min Past Medical History: Past Medical History: Diagnosis Date  Anxiety    Bipolar 1 disorder ([place])   Chronic bronchitis ([place])   Hepatitis C carrier ([place])   HIV (human immunodeficiency virus infection) ([place]) 2015  HIV (human immunodeficiency virus infection) ([place])   Psychiatric disorder   reported h/o schizoaffective, bipolar, anxiety  Schizoaffective disorder ([place])  Past Surgical History: Past Surgical History: Procedure Laterality Date  BIOPSY  06/18/2020  Procedure: BIOPSY;  Surgeon: [name], MD;  Location: [place];  Service: Gastroenterology;;  BREAST EXCISIONAL BIOPSY Left 1987  benign  ESOPHAGOGASTRODUODENOSCOPY (EGD) WITH PROPOFOL N/A 06/18/2020  Procedure: ESOPHAGOGASTRODUODENOSCOPY (EGD) WITH PROPOFOL;  Surgeon: [name], MD;  Location: [place];  Service: Gastroenterology;  Laterality: N/A;  I & D EXTREMITY Right 06/16/2020  Procedure: Open debridement of skin subcutaneous tissue and bone associated with open grade 2 fracture right hand;Radiographs 3 views right hand Complex wound closure degloving injury dorsal aspect of the hand greater than 10 cm. Open reduction and internal fixation right small finger metacarpal shaft ;  Surgeon: [name], MD;  Location: [place];  Service: Orthopedics;  Laterality: Right;  ORIF WRIST FRACTURE Left 06/16/2020  Procedure: Open reduction  internal fixation displaced intra-articular distal radius fracture left wrist 3 more fragments; Radiographs 3 views left wrist Left wrist brachioadialis tendon release, tendon tenotomy;  Surgeon: [name], MD;  Location: [place];  Service: Orthopedics;  Laterality: Left;  SP ARTHRO WRIST*R*    WRIST SURGERY   HPI: Pt is a 60 yo female presenting with dizziness, L sided weakness, and facial droop. MRI showed acute infarcts in the right pons and right cerebellum. PMH includes: anxiety, bipolar, chronic bronchitis, Hep C, HIV, schizoaffective disorder, smoker  Subjective: pt alert, has slurred speech Assessment / Plan / Recommendation CHL IP CLINICAL IMPRESSIONS 09/18/2020 Clinical  Impression Pt presents with functional oropharyngeal swallow although with occasional delay in swallow trigger resulting in flash penetration of thin liquids above and to the vocal cords (PAS 2 & 4), but fully ejected with strength of laryngeal vestibule closure and spontaneous cough response. Penetration observed to be deeper with consecutive sips, improved with small controlled sips via cup or straw. Oral preparation of regular solids was mildly prolonged, however suspect this is due to missing dentition. Bolus cohesion, BOT retraction and pharygneal stripping were adequate resulting in full oropharyngeal clearance of POs, minimal to no residuals noted. Recommend regular/thin liquid diet with use of small controlled sips of liquids via cup or straw. SLP to f/u for tolerance and use of basic strategies. SLP Visit Diagnosis Dysphagia, pharyngeal phase (R13.13) Attention and concentration deficit following -- Frontal lobe and executive function deficit following -- Impact on safety and function Mild aspiration risk   CHL IP TREATMENT RECOMMENDATION 09/18/2020 Treatment Recommendations Therapy as outlined in treatment plan below   Prognosis 09/18/2020 Prognosis for Safe Diet Advancement Good Barriers to Reach Goals Cognitive deficits Barriers/Prognosis Comment -- CHL IP DIET RECOMMENDATION 09/18/2020 SLP Diet Recommendations Regular solids;Thin liquid Liquid Administration via Straw;Cup Medication Administration Whole meds with liquid Compensations Slow rate;Small sips/bites Postural Changes Seated upright at 90 degrees   CHL IP OTHER RECOMMENDATIONS 09/18/2020 Recommended Consults -- Oral Care Recommendations Oral care BID Other Recommendations --   CHL IP FOLLOW UP RECOMMENDATIONS 09/18/2020 Follow up Recommendations Inpatient Rehab   CHL IP FREQUENCY AND DURATION 09/18/2020 Speech Therapy Frequency (ACUTE ONLY) min 2x/week Treatment Duration 2 weeks      CHL IP ORAL PHASE 09/18/2020 Oral Phase WFL Oral - Pudding Teaspoon  -- Oral - Pudding Cup -- Oral - Honey Teaspoon -- Oral - Honey Cup -- Oral - Nectar Teaspoon -- Oral - Nectar Cup -- Oral - Nectar Straw -- Oral - Thin Teaspoon -- Oral - Thin Cup -- Oral - Thin Straw -- Oral - Puree -- Oral - Mech Soft -- Oral - Regular -- Oral - Multi-Consistency -- Oral - Pill -- Oral Phase - Comment --  CHL IP PHARYNGEAL PHASE 09/18/2020 Pharyngeal Phase Impaired Pharyngeal- Pudding Teaspoon -- Pharyngeal -- Pharyngeal- Pudding Cup -- Pharyngeal -- Pharyngeal- Honey Teaspoon -- Pharyngeal -- Pharyngeal- Honey Cup -- Pharyngeal -- Pharyngeal- Nectar Teaspoon -- Pharyngeal -- Pharyngeal- Nectar Cup -- Pharyngeal -- Pharyngeal- Nectar Straw -- Pharyngeal -- Pharyngeal- Thin Teaspoon -- Pharyngeal -- Pharyngeal- Thin Cup Reduced airway/laryngeal closure;Penetration/Aspiration during swallow Pharyngeal Material enters airway, remains ABOVE vocal cords then ejected out Pharyngeal- Thin Straw Reduced airway/laryngeal closure;Penetration/Aspiration during swallow Pharyngeal Material enters airway, CONTACTS cords and then ejected out;Material enters airway, remains ABOVE vocal cords then ejected out Pharyngeal- Puree -- Pharyngeal -- Pharyngeal- Mechanical Soft -- Pharyngeal -- Pharyngeal- Regular -- Pharyngeal -- Pharyngeal- Multi-consistency -- Pharyngeal -- Pharyngeal- Pill -- Pharyngeal -- Pharyngeal Comment --  CHL IP CERVICAL ESOPHAGEAL PHASE 09/18/2020 Cervical Esophageal Phase WFL Pudding Teaspoon -- Pudding Cup -- Honey Teaspoon -- Honey Cup -- Nectar Teaspoon -- Nectar Cup -- Nectar Straw -- Thin Teaspoon -- Thin Cup -- Thin Straw -- Puree -- Mechanical Soft -- Regular -- Multi-consistency -- Pill -- Cervical Esophageal Comment -- Ellwood Dense, MA, CCC-SLP Acute Rehabilitation Services Office Number: [phone] Acie Fredrickson 09/18/2020, 3:45 PM              VAS [place] TRANSCRANIAL DOPPLER W BUBBLES  Result Date: 09/18/2020  Transcranial Doppler with Bubble Patient Name:  Mackenzie Gonzalez   Date of Exam:   09/18/2020 Medical Rec #: [id number]       Accession #:    [id number] Date of Birth: 07/11/1960       Patient Gender: F Patient Age:   48 years Exam Location:  [facility] Procedure:      VAS [place] TRANSCRANIAL DOPPLER W BUBBLES Referring Phys: Cornelius Moras XU --------------------------------------------------------------------------------  Indications: Stroke. Comparison Study: no prior Performing Technologist: Archie Patten RVS  Examination Guidelines: A complete evaluation includes B-mode imaging, spectral Doppler, color Doppler, and power Doppler as needed of all accessible portions of each vessel. Bilateral testing is considered an integral part of a complete examination. Limited examinations for reoccurring indications may be performed as noted.  Summary: No HITS at rest or during Valsalva. Negative transcranial Doppler Bubble study with no evidence of right to left intracardiac communication.  *See table(s) above for TCD measurements and observations.    Preliminary    ECHOCARDIOGRAM COMPLETE BUBBLE STUDY  Result Date: 09/18/2020    ECHOCARDIOGRAM REPORT   Patient Name:   Mackenzie Gonzalez Date of Exam: 09/18/2020 Medical Rec #:  [id number]      Height:       65.0 in Accession #:    [id number]     Weight:       192.2 lb Date of Birth:  [birth year]      BSA:          1.945 m Patient Age:    28 years       BP:           128/94 mmHg Patient Gender: F              HR:           74 bpm. Exam Location:  Inpatient Procedure: 2D Echo, Cardiac Doppler, Color Doppler and Saline Contrast Bubble            Study Indications:    CVA  History:        Patient has no prior history of Echocardiogram examinations.                 COPD, Arrythmias:Abnormal EKG; Risk Factors:Current Smoker. Hep.                 C, HIV, Substance abuse, Schzophrenia.  Sonographer:    Dustin Flock RDCS Referring Phys: [id number] Rhetta Mura  Sonographer Comments: Technically difficult study due to poor echo windows. Image  acquisition challenging due to uncooperative patient. IMPRESSIONS  1. Left ventricular ejection fraction, by estimation, is 55 to 60%. The left ventricle has normal function. The left ventricle has no regional wall motion abnormalities. There is mild left ventricular hypertrophy. Left ventricular diastolic parameters are indeterminate.  2. Right ventricular systolic function is normal. The right ventricular size is normal. There is normal pulmonary artery systolic pressure. The estimated right  ventricular systolic pressure is 0000000 mmHg.  3. The mitral valve is normal in structure. No evidence of mitral valve regurgitation. No evidence of mitral stenosis.  4. The aortic valve was not well visualized. Aortic valve regurgitation is not visualized. No aortic stenosis is present.  5. The inferior vena cava is normal in size with greater than 50% respiratory variability, suggesting right atrial pressure of 3 mmHg.  6. Agitated saline contrast bubble study was negative, with no evidence of any interatrial shunt. Technically difficult bubble study, but no shunting seen FINDINGS  Left Ventricle: Left ventricular ejection fraction, by estimation, is 55 to 60%. The left ventricle has normal function. The left ventricle has no regional wall motion abnormalities. The left ventricular internal cavity size was normal in size. There is  mild left ventricular hypertrophy. Left ventricular diastolic parameters are indeterminate. Right Ventricle: The right ventricular size is normal. No increase in right ventricular wall thickness. Right ventricular systolic function is normal. There is normal pulmonary artery systolic pressure. The tricuspid regurgitant velocity is 2.16 m/s, and  with an assumed right atrial pressure of 3 mmHg, the estimated right ventricular systolic pressure is 0000000 mmHg. Left Atrium: Left atrial size was normal in size. Right Atrium: Right atrial size was normal in size. Pericardium: There is no evidence of  pericardial effusion. Presence of pericardial fat pad. Mitral Valve: The mitral valve is normal in structure. No evidence of mitral valve regurgitation. No evidence of mitral valve stenosis. Tricuspid Valve: The tricuspid valve is normal in structure. Tricuspid valve regurgitation is trivial. Aortic Valve: The aortic valve was not well visualized. Aortic valve regurgitation is not visualized. No aortic stenosis is present. Pulmonic Valve: The pulmonic valve was not well visualized. Pulmonic valve regurgitation is not visualized. Aorta: The aortic root is normal in size and structure. Venous: The inferior vena cava is normal in size with greater than 50% respiratory variability, suggesting right atrial pressure of 3 mmHg. IAS/Shunts: The interatrial septum was not well visualized. Agitated saline contrast was given intravenously to evaluate for intracardiac shunting. Agitated saline contrast bubble study was negative, with no evidence of any interatrial shunt.  LEFT VENTRICLE PLAX 2D LVIDd:         4.70 cm  Diastology LVIDs:         3.30 cm  LV e' medial:    5.00 cm/s LV PW:         1.00 cm  LV E/e' medial:  13.8 LV IVS:        1.00 cm  LV e' lateral:   6.85 cm/s LVOT diam:     1.70 cm  LV E/e' lateral: 10.1 LV SV:         40 LV SV Index:   21 LVOT Area:     2.27 cm  RIGHT VENTRICLE RV Basal diam:  1.50 cm RV S prime:     10.20 cm/s TAPSE (M-mode): 0.9 cm LEFT ATRIUM           Index       RIGHT ATRIUM          Index LA diam:      2.90 cm 1.49 cm/m  RA Area:     7.03 cm LA Vol (A2C): 33.4 ml 17.17 ml/m RA Volume:   9.78 ml  5.03 ml/m LA Vol (A4C): 23.4 ml 12.03 ml/m  AORTIC VALVE LVOT Vmax:   90.10 cm/s LVOT Vmean:  62.300 cm/s LVOT VTI:    0.176 m  AORTA Ao  Root diam: 3.00 cm MITRAL VALVE               TRICUSPID VALVE MV Area (PHT): 3.51 cm    TR Peak grad:   18.7 mmHg MV Decel Time: 216 msec    TR Vmax:        216.00 cm/s MV E velocity: 69.00 cm/s MV A velocity: 72.80 cm/s  SHUNTS MV E/A ratio:  0.95         Systemic VTI:  0.18 m                            Systemic Diam: 1.70 cm Oswaldo Milian MD Electronically signed by Oswaldo Milian MD Signature Date/Time: 09/18/2020/4:35:29 PM    Final     Microbiology: Recent Results (from the past 240 hour(s))  Resp Panel by RT-PCR (Flu A&B, Covid) Nasopharyngeal Swab     Status: None   Collection Time: 09/17/20 11:23 AM   Specimen: Nasopharyngeal Swab; Nasopharyngeal(NP) swabs in vial transport medium  Result Value Ref Range Status   SARS Coronavirus 2 by RT PCR NEGATIVE NEGATIVE Final    Comment: (NOTE) SARS-CoV-2 target nucleic acids are NOT DETECTED.  The SARS-CoV-2 RNA is generally detectable in upper respiratory specimens during the acute phase of infection. The lowest concentration of SARS-CoV-2 viral copies this assay can detect is 138 copies/mL. A negative result does not preclude SARS-Cov-2 infection and should not be used as the sole basis for treatment or other patient management decisions. A negative result may occur with  improper specimen collection/handling, submission of specimen other than nasopharyngeal swab, presence of viral mutation(s) within the areas targeted by this assay, and inadequate number of viral copies(<138 copies/mL). A negative result must be combined with clinical observations, patient history, and epidemiological information. The expected result is Negative.  Fact Sheet for Patients:  EntrepreneurPulse.com.au  Fact Sheet for Healthcare Providers:  IncredibleEmployment.be  This test is no t yet approved or cleared by the [place] FDA and  has been authorized for detection and/or diagnosis of SARS-CoV-2 by FDA under an Emergency Use Authorization (EUA). This EUA will remain  in effect (meaning this test can be used) for the duration of the COVID-19 declaration under Section 564(b)(1) of the Act, 21 U.S.C.section 360bbb-3(b)(1), unless the authorization is  terminated  or revoked sooner.       Influenza A by PCR NEGATIVE NEGATIVE Final   Influenza B by PCR NEGATIVE NEGATIVE Final    Comment: (NOTE) The Xpert Xpress SARS-CoV-2/FLU/RSV plus assay is intended as an aid in the diagnosis of influenza from Nasopharyngeal swab specimens and should not be used as a sole basis for treatment. Nasal washings and aspirates are unacceptable for Xpert Xpress SARS-CoV-2/FLU/RSV testing.  Fact Sheet for Patients: EntrepreneurPulse.com.au  Fact Sheet for Healthcare Providers: IncredibleEmployment.be  This test is not yet approved or cleared by the [place] FDA and has been authorized for detection and/or diagnosis of SARS-CoV-2 by FDA under an Emergency Use Authorization (EUA). This EUA will remain in effect (meaning this test can be used) for the duration of the COVID-19 declaration under Section 564(b)(1) of the Act, 21 U.S.C. section 360bbb-3(b)(1), unless the authorization is terminated or revoked.  Performed at [facility], [address] [place], [place] [address]      Labs: CBC: Recent Labs  Lab 09/17/20 1054 09/18/20 0222  WBC 5.9 4.6  NEUTROABS 3.5  --   HGB 13.4 14.5  HCT  40.4 45.5  MCV 92.9 98.5  PLT 204 [id number]   Basic Metabolic Panel: Recent Labs  Lab 09/17/20 1054 09/18/20 0222  NA 140 137  K 4.1 4.3  CL 108 107  CO2 24 20*  GLUCOSE 95 95  BUN 9 9  CREATININE 0.71 0.72  CALCIUM 9.4 9.3  MG  --  2.1   Liver Function Tests: Recent Labs  Lab 09/18/20 0222  AST 24  ALT 15  ALKPHOS 68  BILITOT 1.2  PROT 6.8  ALBUMIN 3.5   CBG: Recent Labs  Lab 09/19/20 0739  GLUCAP 110*    Time spent: 35 minutes  Signed:  Berle Mull  [facility] 09/21/2020

## 2020-09-22 NOTE — Plan of Care (Signed)
  Problem: RH Swallowing Goal: LTG Patient will consume least restrictive diet using compensatory strategies with assistance (SLP) Description: LTG:  Patient will consume least restrictive diet using compensatory strategies with assistance (SLP) Flowsheets (Taken 09/22/2020 1744) LTG: Pt Patient will consume least restrictive diet using compensatory strategies with assistance of (SLP): Modified Independent   Problem: RH Problem Solving Goal: LTG Patient will demonstrate problem solving for (SLP) Description: LTG:  Patient will demonstrate problem solving for basic/complex daily situations with cues  (SLP) Flowsheets (Taken 09/22/2020 1744) LTG: Patient will demonstrate problem solving for (SLP): Complex daily situations LTG Patient will demonstrate problem solving for: Supervision   Problem: RH Attention Goal: LTG Patient will demonstrate this level of attention during functional activites (SLP) Description: LTG:  Patient will will demonstrate this level of attention during functional activites (SLP) Flowsheets (Taken 09/22/2020 1744) Patient will demonstrate during cognitive/linguistic activities the attention type of: Selective LTG: Patient will demonstrate this level of attention during cognitive/linguistic activities with assistance of (SLP): Modified Independent   Problem: RH Awareness Goal: LTG: Patient will demonstrate awareness during functional activites type of (SLP) Description: LTG: Patient will demonstrate awareness during functional activites type of (SLP) Flowsheets (Taken 09/22/2020 1744) Patient will demonstrate during cognitive/linguistic activities awareness type of: Emergent LTG: Patient will demonstrate awareness during cognitive/linguistic activities with assistance of (SLP): Supervision   Problem: RH Expression Communication Goal: LTG Patient will increase speech intelligibility (SLP) Description: LTG: Patient will increase speech intelligibility at  word/phrase/conversation level with cues, % of the time (SLP) Flowsheets (Taken 09/22/2020 1746) LTG: Patient will increase speech intelligibility (SLP): Modified Independent Level: Conversation level

## 2020-09-22 NOTE — Evaluation (Signed)
Physical Therapy Assessment and Plan  Patient Details  Name: Mackenzie Gonzalez MRN: [id number] Date of Birth: [birth year]  PT Diagnosis: Abnormal posture, Abnormality of gait, Cognitive deficits, Coordination disorder, Difficulty walking, Hemiplegia non-dominant, and Muscle weakness Rehab Potential: Good ELOS: 14   Today's Date: 09/22/2020 PT Individual Time: 8250-5397 PT Individual Time Calculation (min): 74 min    Hospital Problem: Principal Problem:   Right pontine cerebrovascular accident [facility])   Past Medical History:  Past Medical History:  Diagnosis Date   Anxiety    Bipolar 1 disorder ([place])    Chronic bronchitis ([place])    Hepatitis C carrier ([place])    HIV (human immunodeficiency virus infection) ([place]) 2015   HIV (human immunodeficiency virus infection) ([place])    Psychiatric disorder    reported h/o schizoaffective, bipolar, anxiety   Schizoaffective disorder ([place])    Past Surgical History:  Past Surgical History:  Procedure Laterality Date   BIOPSY  06/18/2020   Procedure: BIOPSY;  Surgeon: [name], MD;  Location: [place];  Service: Gastroenterology;;   BREAST EXCISIONAL BIOPSY Left 1987   benign   ESOPHAGOGASTRODUODENOSCOPY (EGD) WITH PROPOFOL N/A 06/18/2020   Procedure: ESOPHAGOGASTRODUODENOSCOPY (EGD) WITH PROPOFOL;  Surgeon: [name], MD;  Location: [place];  Service: Gastroenterology;  Laterality: N/A;   I & D EXTREMITY Right 06/16/2020   Procedure: Open debridement of skin subcutaneous tissue and bone associated with open grade 2 fracture right hand;Radiographs 3 views right hand Complex wound closure degloving injury dorsal aspect of the hand greater than 10 cm. Open reduction and internal fixation right small finger metacarpal shaft ;  Surgeon: [name], MD;  Location: [place];  Service: Orthopedics;  Laterality: Right;   ORIF WRIST FRACTURE Left 06/16/2020   Procedure: Open reduction internal fixation displaced intra-articular distal  radius fracture left wrist 3 more fragments; Radiographs 3 views left wrist Left wrist brachioadialis tendon release, tendon tenotomy;  Surgeon: [name], MD;  Location: [place];  Service: Orthopedics;  Laterality: Left;   SP ARTHRO WRIST*R*     WRIST SURGERY      Assessment & Plan Clinical Impression: HPI: Mackenzie Scotti. Gonzalez is a 60 year old right-handed female with medical history significant for HIV 2015, hepatitis C, chronic bronchitis, IBS, chronic tobacco/polysubstance use, anxiety/bipolar disorder.  Per chart review she lives with her sister  1 level home one-step to entry.  Ambulated independently prior to admission.  Her sister works from home except out of the house on Mondays and Fridays.   Presented 09/17/2020 with acute onset of dizziness left facial droop/slurred speech and left-sided weakness.  Cranial CT scan negative for acute changes.  CT angiogram head and neck no large vessel occlusion or significant stenosis.  Patient did not receive tPA.  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  Mild associated edema without mass-effect.  Admission chemistries unremarkable, urine drug screen positive benzos as well as marijuana.  Most recent CD4 count June 22 noted to be 869 while most recent HIV RNA viral load to be 26 December 2019.  Echocardiogram with ejection fraction of 55 to 60% no wall motion abnormalities.  Currently maintained on aspirin 81 mg daily and Plavix 75 mg daily for CVA prophylaxis x3 weeks then aspirin alone.  Plan for 30-day cardiac event monitor.  Patient was initially on Genvoya for HIV which impairs conversion of Plavix to active form thus discontinued and started on Biktarvy.  Tolerating a regular consistency diet.  Therapy evaluations completed due to patient's left-sided  weakness and dysarthria was admitted for a comprehensive rehab program. Patient transferred to CIR on 09/21/2020 .   Patient currently requires min with mobility secondary to muscle  weakness, decreased cardiorespiratoy endurance, unbalanced muscle activation and decreased coordination, decreased safety awareness, and decreased sitting balance, decreased standing balance, decreased postural control, hemiplegia, and decreased balance strategies.  Prior to hospitalization, patient was min with mobility and lived with Family (Sister) in a House home.  Home access is 1Stairs to enter.  Patient will benefit from skilled PT intervention to maximize safe functional mobility, minimize fall risk, and decrease caregiver burden for planned discharge home with intermittent assist.  Anticipate patient will benefit from follow up OP at discharge.  PT - End of Session Activity Tolerance: Tolerates < 10 min activity, no significant change in vital signs Endurance Deficit: Yes Endurance Deficit Description: Pt requiring rest breaks in between activities with increased respiration rate, returns to resting rate quickly PT Assessment Rehab Potential (ACUTE/IP ONLY): Good PT Barriers to Discharge: Inaccessible home environment;Decreased caregiver support;Lack of/limited family support;Behavior PT Patient demonstrates impairments in the following area(s): Balance;Behavior;Edema;Endurance;Motor;Pain;Nutrition;Perception;Safety;Sensory;Skin Integrity PT Transfers Functional Problem(s): Bed Mobility;Bed to Chair;Car;Furniture PT Locomotion Functional Problem(s): Ambulation;Wheelchair Mobility;Stairs PT Plan PT Intensity: Minimum of 1-2 x/day ,45 to 90 minutes PT Frequency: 5 out of 7 days PT Duration Estimated Length of Stay: 14 PT Treatment/Interventions: Ambulation/gait training;Discharge planning;Functional mobility training;Therapeutic Activities;Psychosocial support;Visual/perceptual remediation/compensation;Balance/vestibular training;Disease management/prevention;Neuromuscular re-education;Skin care/wound management;Therapeutic Exercise;Cognitive remediation/compensation;DME/adaptive equipment  instruction;Pain management;Splinting/orthotics;UE/LE Strength taining/ROM;Wheelchair propulsion/positioning;Community reintegration;Patient/family education;Functional electrical stimulation;Stair training;UE/LE Coordination activities PT Transfers Anticipated Outcome(s): ModI PT Locomotion Anticipated Outcome(s): ModI wiht LRAD PT Recommendation Follow Up Recommendations: Outpatient PT Patient destination: Home Equipment Recommended: To be determined;Standard walker   PT Evaluation Precautions/Restrictions Precautions Precautions: Fall Precaution Comments: Left hemi, decreased safety awareness Restrictions Weight Bearing Restrictions: No Pain Pain Assessment Pain Scale: 0-10 Pain Score: 0-No pain Faces Pain Scale: Hurts a little bit Pain Type: Chronic pain Pain Location: Abdomen Pain Orientation: Lateral Pain Descriptors / Indicators: Aching Pain Onset: On-going Pain Interference Pain Interference Pain Effect on Sleep: 1. Rarely or not at all Pain Interference with Therapy Activities: 2. Occasionally Pain Interference with Day-to-Day Activities: 2. Occasionally Home Living/Prior Functioning Home Living Living Arrangements: Other relatives (Lives with sister) Available Help at Discharge: Available 24 hours/day Type of Home: House Home Access: Stairs to enter [employer] of Steps: 1 Home Layout: One level Bathroom Shower/Tub: Chiropodist: Standard Bathroom Accessibility: Yes Additional Comments: sister is there every day except M/F, states she is alone for most of those days, but otherwise sister present.  Lives With: Family (Sister) Prior Function Level of Independence: Independent with basic ADLs;Independent with homemaking with ambulation;Independent with gait;Independent with transfers  Able to Take Stairs?: Yes Vocation: On disability Vision/Perception  Vision - History Ability to See in Adequate Light: 0 Adequate Vision -  Assessment Eye Alignment: Within Functional Limits Ocular Range of Motion: Within Functional Limits Alignment/Gaze Preference: Within Defined Limits Tracking/Visual Pursuits: Able to track stimulus in all quads without difficulty Saccades: Within functional limits Convergence: Within functional limits Perception Perception: Impaired Inattention/Neglect: Does not attend to left side of body Praxis Praxis: Intact  Cognition Overall Cognitive Status: Impaired/Different from baseline Arousal/Alertness: Awake/alert Orientation Level: Oriented X4 Year: 2022 Month: August Day of Week: Correct Behaviors: Impulsive Safety/Judgment: Impaired Comments: Implusive, not waiting to lock brakes prior to transfer/putting L hand in saddle splint, attempting to stand up during instructions for activities, etc Sensation Sensation Light Touch: Appears Intact Hot/Cold: Appears Intact Proprioception:  Impaired by gross assessment Stereognosis: Impaired by gross assessment Additional Comments: reporrts LUE numbness but able to appreciate LT to all digits Coordination Gross Motor Movements are Fluid and Coordinated: No Fine Motor Movements are Fluid and Coordinated: No Coordination and Movement Description: LUE>LLE hemi Finger Nose Finger Test: Able to grossly complete with open hand, unable to isolate digit to complete Heel Shin Test: Unable to complete on LLE due to weakness Motor  Motor Motor: Hemiplegia Motor - Skilled Clinical Observations: LUE>LLE hemi   Trunk/Postural Assessment  Cervical Assessment Cervical Assessment: Within Functional Limits Thoracic Assessment Thoracic Assessment: Exceptions to [facility] (rounded shoulders) Lumbar Assessment Lumbar Assessment: Exceptions to [facility] (posterior pelvic tilt) Postural Control Postural Control: Deficits on evaluation Righting Reactions: delayed and inaedequate Protective Responses: delayed and inaedequate  Balance Balance Balance Assessed:  Yes Standardized Balance Assessment Standardized Balance Assessment: Timed Up and Go Test Timed Up and Go Test TUG: Normal TUG Normal TUG (seconds): 36 Static Sitting Balance Static Sitting - Balance Support: Feet supported;No upper extremity supported Static Sitting - Level of Assistance: 5: Stand by assistance Static Sitting - Comment/# of Minutes: S Dynamic Sitting Balance Dynamic Sitting - Balance Support: No upper extremity supported;Feet supported Dynamic Sitting - Level of Assistance: 5: Stand by assistance Dynamic Sitting Balance - Compensations: S Dynamic Sitting - Balance Activities: Reaching for objects;Reaching across midline Sitting balance - Comments: S Static Standing Balance Static Standing - Balance Support: During functional activity;No upper extremity supported Static Standing - Level of Assistance: 4: Min assist Static Standing - Comment/# of Minutes: CGA Dynamic Standing Balance Dynamic Standing - Balance Support: During functional activity;No upper extremity supported Dynamic Standing - Level of Assistance: 3: Mod assist Extremity Assessment  RLE Assessment RLE Assessment: Within Functional Limits General Strength Comments: 4+5 globally LLE Assessment LLE Assessment: Exceptions to [facility] LLE Strength Left Hip Flexion: 3/5 Left Hip ABduction: 4-/5 Left Hip ADduction: 4-/5 Left Knee Flexion: 3+/5 Left Knee Extension: 4-/5 Left Ankle Dorsiflexion: 3+/5  Care Tool Care Tool Bed Mobility Roll left and right activity   Roll left and right assist level: Independent    Sit to lying activity   Sit to lying assist level: Supervision/Verbal cueing    Lying to sitting on side of bed activity   Lying to sitting on side of bed assist level: the ability to move from lying on the back to sitting on the side of the bed with no back support.: Supervision/Verbal cueing     Care Tool Transfers Sit to stand transfer   Sit to stand assist level: Minimal Assistance -  Patient > 75%    Chair/bed transfer   Chair/bed transfer assist level: Minimal Assistance - Patient > 75%     Toilet transfer   Assist Level: Minimal Assistance - Patient > 75%    Car transfer   Car transfer assist level: Minimal Assistance - Patient > 75%      Care Tool Locomotion Ambulation   Assist level: Moderate Assistance - Patient 50 - 74% (with fatigue later in session) Assistive device: Walker-rolling Max distance: 55  Walk 10 feet activity   Assist level: Minimal Assistance - Patient > 75% Assistive device: Walker-rolling   Walk 50 feet with 2 turns activity   Assist level: Moderate Assistance - Patient - 50 - 74% Assistive device: Walker-rolling  Walk 150 feet activity Walk 150 feet activity did not occur: Safety/medical concerns      Walk 10 feet on uneven surfaces activity Walk 10 feet on uneven surfaces  activity did not occur: Safety/medical concerns      Stairs   Assist level: Moderate Assistance - Patient - 50 - 74% Stairs assistive device: 2 hand rails Max number of stairs: 4  Walk up/down 1 step activity Walk up/down 1 step or curb (drop down) activity did not occur: Safety/medical concerns        Walk up/down 4 steps activity Walk up/down 4 steps assist level: Moderate Assistance - Patient - 50 - 74% Walk up/down 4 steps assistive device: 2 hand rails  Walk up/down 12 steps activity Walk up/down 12 steps activity did not occur: Safety/medical concerns      Pick up small objects from floor Pick up small object from the floor (from standing position) activity did not occur: Safety/medical concerns      Wheelchair Is the patient using a wheelchair?: No     Wheelchair assist level: Minimal Assistance - Patient > 75%    Wheel 50 feet with 2 turns activity   Assist Level: Minimal Assistance - Patient > 75%  Wheel 150 feet activity    Assist Level: Minimal Assistance - Patient > 75%    Refer to Care Plan for Long Term Goals  SHORT TERM GOAL WEEK  1 PT Short Term Goal 1 (Week 1): Pt will complete bed mobility ModI PT Short Term Goal 2 (Week 1): Pt will complete sit <> stands CGA PT Short Term Goal 3 (Week 1): Pt will ambulate 23f with CGA PT Short Term Goal 4 (Week 1): Pt will complete stairs with MinA PT Short Term Goal 5 (Week 1): PT will complete a BERG Balance  Recommendations for other services: None   Skilled Therapeutic Intervention Pt received in bed and agreeable to therapy evaluation, pt states she needs to use the bathroom.  Evaluation completed (see details above) with patient education regarding purpose of PT evaluation, PT POC and goals, therapy schedule, weekly team meetings, and other CIR information including safety plan and fall risk safety.  Gait throughout session with RW up to heavy MinA for steadying secondary to impairments. See ambulation section for gait deviations. Final ambulation back towards bed noted increased unsteadiness, increased difficulty with LLE foot clearance and increased bilateral knee flexion in stance 2/2 fatigue. Pt presents with mild "knee buckle" like flexion bilaterally with gait despite 4+/5 strength RLE. Longest distance 544f  Stairs completed with MinA ascending 2 hand rails. ModA descending 2/2 weakness, therapist provided soft LLE knee block. Verbal cuing for upright posture and manual anterior facilitation at pelvis posteriorly to maintain balance.   TUG completed 36 seconds (A score of >13.5 seconds indicates patient is at a high fall risk. Pt educated on interpretation of their score)  5xSTS 29 second (A score of 15 seconds or greater indicates patient is at an increased risk for falls. Education provided to patient on interpretation of balance score)   Pt completed tolieting at start of evaluation and end of evaluation, attempting to have BM, however unsuccessful. ModA for standing balance with LUE support on RW while pt completed peri care.   Pt in bed with bed alarm on and call  bell within reach.   Mobility Bed Mobility Bed Mobility: Supine to Sit;Sit to Supine Supine to Sit: Supervision/Verbal cueing Sit to Supine: Supervision/Verbal cueing Transfers Transfers: Sit to Stand;Stand Pivot Transfers;Stand to Sit Sit to Stand: Minimal Assistance - Patient > 75% Stand to Sit: Contact Guard/Touching assist Stand Pivot Transfers: Minimal Assistance - Patient > 75% Stand Pivot Transfer Details: Verbal  cues for sequencing;Verbal cues for precautions/safety;Verbal cues for technique;Verbal cues for safe use of DME/AE Transfer (Assistive device): Rolling walker Locomotion  Gait Ambulation: Yes Gait Assistance: Minimal Assistance - Patient > 75% Gait Distance (Feet): 55 Feet Assistive device: Rolling walker Gait Assistance Details: Verbal cues for technique;Verbal cues for sequencing;Verbal cues for safe use of DME/AE;Verbal cues for precautions/safety Gait Gait: Yes Gait Pattern: Impaired Gait Pattern: Step-to pattern;Decreased step length - right;Decreased stance time - left;Decreased stride length;Decreased dorsiflexion - left;Right flexed knee in stance;Left flexed knee in stance;Antalgic;Trendelenburg;Lateral trunk lean to right;Lateral trunk lean to left;Narrow base of support;Trunk flexed Gait velocity: Decreased Stairs / Additional Locomotion Stairs: Yes Stairs Assistance: Moderate Assistance - Patient 50 - 74% Stair Management Technique: Two rails Number of Stairs: 4 Height of Stairs: 6 Wheelchair Mobility Wheelchair Mobility: No   Discharge Criteria: Patient will be discharged from PT if patient refuses treatment 3 consecutive times without medical reason, if treatment goals not met, if there is a change in medical status, if patient makes no progress towards goals or if patient is discharged from hospital.  The above assessment, treatment plan, treatment alternatives and goals were discussed and mutually agreed upon: by patient  Emani Morad,  SPT 09/22/2020, 3:30 PM

## 2020-09-22 NOTE — Plan of Care (Deleted)
  Problem: RH Swallowing Goal: LTG Patient will consume least restrictive diet using compensatory strategies with assistance (SLP) Description: LTG:  Patient will consume least restrictive diet using compensatory strategies with assistance (SLP) Flowsheets (Taken 09/22/2020 1744) LTG: Pt Patient will consume least restrictive diet using compensatory strategies with assistance of (SLP): Modified Independent   Problem: RH Problem Solving Goal: LTG Patient will demonstrate problem solving for (SLP) Description: LTG:  Patient will demonstrate problem solving for basic/complex daily situations with cues  (SLP) Flowsheets (Taken 09/22/2020 1744) LTG: Patient will demonstrate problem solving for (SLP): Complex daily situations LTG Patient will demonstrate problem solving for: Supervision   Problem: RH Attention Goal: LTG Patient will demonstrate this level of attention during functional activites (SLP) Description: LTG:  Patient will will demonstrate this level of attention during functional activites (SLP) Flowsheets (Taken 09/22/2020 1744) Patient will demonstrate during cognitive/linguistic activities the attention type of: Selective LTG: Patient will demonstrate this level of attention during cognitive/linguistic activities with assistance of (SLP): Modified Independent   Problem: RH Awareness Goal: LTG: Patient will demonstrate awareness during functional activites type of (SLP) Description: LTG: Patient will demonstrate awareness during functional activites type of (SLP) Flowsheets (Taken 09/22/2020 1744) Patient will demonstrate during cognitive/linguistic activities awareness type of: Emergent LTG: Patient will demonstrate awareness during cognitive/linguistic activities with assistance of (SLP): Supervision

## 2020-09-22 NOTE — IPOC Note (Signed)
Overall Plan of Care [facility]) Patient Details Name: Mackenzie Gonzalez MRN: [id number] DOB: [birth year]  Admitting Diagnosis: Right pontine cerebrovascular accident [facility])  Hospital Problems: Principal Problem:   Right pontine cerebrovascular accident [facility])     Functional Problem List: Nursing Bowel, Endurance, Safety, Medication Management, Pain  PT Balance, Behavior, Edema, Endurance, Motor, Pain, Nutrition, Perception, Safety, Sensory, Skin Integrity  OT Balance, Sensory, Skin Integrity, Behavior, Cognition, Endurance, Safety, Perception, Pain  SLP    TR         Basic ADL's: OT Eating, Grooming, Bathing, Dressing, Toileting     Advanced  ADL's: OT Simple Meal Preparation     Transfers: PT Bed Mobility, Bed to Chair, Car, Manufacturing systems engineer, Metallurgist: PT Ambulation, Emergency planning/management officer, Stairs     Additional Impairments: OT Fuctional Use of Upper Extremity  SLP        TR      Anticipated Outcomes Item Anticipated Outcome  Self Feeding mod I  Swallowing      Basic self-care  mod I  Toileting  mod I   Bathroom Transfers mod I, S  Bowel/Bladder  manage w mod I assist  Transfers  ModI  Locomotion  ModI wiht LRAD  Communication     Cognition     Pain  at or below level 4  Safety/Judgment  maintain w cues   Therapy Plan: PT Intensity: Minimum of 1-2 x/day ,45 to 90 minutes PT Frequency: 5 out of 7 days PT Duration Estimated Length of Stay: 14 OT Intensity: Minimum of 1-2 x/day, 45 to 90 minutes OT Frequency: 5 out of 7 days OT Duration/Estimated Length of Stay: 14 to 16 days     Due to the current state of emergency, patients may not be receiving their 3-hours of Medicare-mandated therapy.   Team Interventions: Nursing Interventions Disease Management/Prevention, Medication Management, Discharge Planning, Pain Management, Bowel Management, Patient/Family Education  PT interventions Ambulation/gait training, Discharge planning,  Functional mobility training, Therapeutic Activities, Psychosocial support, Visual/perceptual remediation/compensation, Balance/vestibular training, Disease management/prevention, Neuromuscular re-education, Skin care/wound management, Therapeutic Exercise, Cognitive remediation/compensation, DME/adaptive equipment instruction, Pain management, Splinting/orthotics, UE/LE Strength taining/ROM, Wheelchair propulsion/positioning, Community reintegration, Barrister's clerk education, Technical sales engineer stimulation, IT trainer, UE/LE Coordination activities  OT Interventions Training and development officer, Cognitive remediation/compensation, Community reintegration, Disease mangement/prevention, Technical sales engineer stimulation, Neuromuscular re-education, Patient/family education, Self Care/advanced ADL retraining, Splinting/orthotics, Therapeutic Exercise, UE/LE Coordination activities, Wheelchair propulsion/positioning, Visual/perceptual remediation/compensation, UE/LE Strength taining/ROM, Therapeutic Activities, Skin care/wound managment, Psychosocial support, Pain management, DME/adaptive equipment instruction, Functional mobility training, Discharge planning  SLP Interventions    TR Interventions    SW/CM Interventions Discharge Planning, Psychosocial Support, Patient/Family Education, Disease Management/Prevention   Barriers to Discharge MD  Medical stability  Nursing Decreased caregiver support, Home environment access/layout 1 level 1 ste w sister who works M/F  PT Inaccessible home environment, Decreased caregiver support, Lack of/limited family support, Behavior    OT Decreased caregiver support    SLP      SW       Team Discharge Planning: Destination: PT-Home ,OT- Home , SLP-  Projected Follow-up: PT-Outpatient PT, OT-  Outpatient OT, SLP-  Projected Equipment Needs: PT-To be determined, Standard walker, OT- To be determined, SLP-  Equipment Details: PT- , OT-  Patient/family  involved in discharge planning: PT- Patient,  OT-Patient, SLP-   MD ELOS: 14 days Medical Rehab Prognosis:  Excellent Assessment: The patient has been admitted for CIR therapies with the diagnosis of right pontine infarct with left hemiparesis.  The team will be addressing functional mobility, strength, stamina, balance, safety, adaptive techniques and equipment, self-care, bowel and bladder mgt, patient and caregiver education, NMR, community reentry. Goals have been set at mod I to occasional supervision with functional mobility and self-care.   Due to the current state of emergency, patients may not be receiving their 3 hours per day of Medicare-mandated therapy.    Meredith Staggers, MD, FAAPMR     See Team Conference Notes for weekly updates to the plan of care
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
## 2020-09-22 NOTE — Progress Notes (Signed)
[facility] Individual Statement of Services  Patient Name:  Mackenzie Gonzalez  Date:  09/22/2020  Welcome to the [facility].  Our goal is to provide you with an individualized program based on your diagnosis and situation, designed to meet your specific needs.  With this comprehensive rehabilitation program, you will be expected to participate in at least 3 hours of rehabilitation therapies Monday-Friday, with modified therapy programming on the weekends.  Your rehabilitation program will include the following services:  Physical Therapy (PT), Occupational Therapy (OT), Speech Therapy (ST), 24 hour per day rehabilitation nursing, Therapeutic Recreaction (TR), Neuropsychology, Care Coordinator, Rehabilitation Medicine, Nutrition Services, Pharmacy Services, and Other  Weekly team conferences will be held on Wednesdays to discuss your progress.  Your Inpatient Rehabilitation Care Coordinator will talk with you frequently to get your input and to update you on team discussions.  Team conferences with you and your family in attendance may also be held.  Expected length of stay:  10-12 Days  Overall anticipated outcome:  MOD I - Intermittent Supervision  Depending on your progress and recovery, your program may change. Your Inpatient Rehabilitation Care Coordinator will coordinate services and will keep you informed of any changes. Your Inpatient Rehabilitation Care Coordinator's name and contact numbers are listed  below.  The following services may also be recommended but are not provided by the [facility]:   [facility] will be made to provide these services after discharge if needed.  Arrangements include referral to agencies that provide these services.  Your insurance has been verified to be:  [place]  Your primary doctor is:  Buddy Duty,  DO  Pertinent information will be shared with your doctor and your insurance company.  Inpatient Rehabilitation Care Coordinator:  Erlene Quan, [place] or [phone]  Information discussed with and copy given to patient by: Dyanne Iha, 09/22/2020, 12:06 PM

## 2020-09-22 NOTE — Plan of Care (Signed)
  Problem: RH Balance Goal: LTG Patient will maintain dynamic sitting balance (PT) Description: LTG:  Patient will maintain dynamic sitting balance with assistance during mobility activities (PT) Flowsheets (Taken 09/22/2020 1916) LTG: Pt will maintain dynamic sitting balance during mobility activities with:: Independent with assistive device  Goal: LTG Patient will maintain dynamic standing balance (PT) Description: LTG:  Patient will maintain dynamic standing balance with assistance during mobility activities (PT) Flowsheets (Taken 09/22/2020 1916) LTG: Pt will maintain dynamic standing balance during mobility activities with:: Independent with assistive device    Problem: Sit to Stand Goal: LTG:  Patient will perform sit to stand with assistance level (PT) Description: LTG:  Patient will perform sit to stand with assistance level (PT) Flowsheets (Taken 09/22/2020 1916) LTG: PT will perform sit to stand in preparation for functional mobility with assistance level: Independent with assistive device   Problem: RH Bed Mobility Goal: LTG Patient will perform bed mobility with assist (PT) Description: LTG: Patient will perform bed mobility with assistance, with/without cues (PT). Flowsheets (Taken 09/22/2020 1916) LTG: Pt will perform bed mobility with assistance level of: Independent with assistive device    Problem: RH Bed to Chair Transfers Goal: LTG Patient will perform bed/chair transfers w/assist (PT) Description: LTG: Patient will perform bed to chair transfers with assistance (PT). Flowsheets (Taken 09/22/2020 1916) LTG: Pt will perform Bed to Chair Transfers with assistance level: Independent with assistive device    Problem: RH Car Transfers Goal: LTG Patient will perform car transfers with assist (PT) Description: LTG: Patient will perform car transfers with assistance (PT). Flowsheets (Taken 09/22/2020 1916) LTG: Pt will perform car transfers with assist:: Supervision/Verbal  cueing   Problem: RH Ambulation Goal: LTG Patient will ambulate in controlled environment (PT) Description: LTG: Patient will ambulate in a controlled environment, # of feet with assistance (PT). Flowsheets (Taken 09/22/2020 1916) LTG: Pt will ambulate in controlled environ  assist needed:: Independent with assistive device LTG: Ambulation distance in controlled environment: 150 Goal: LTG Patient will ambulate in home environment (PT) Description: LTG: Patient will ambulate in home environment, # of feet with assistance (PT). Flowsheets (Taken 09/22/2020 1916) LTG: Pt will ambulate in home environ  assist needed:: Independent with assistive device LTG: Ambulation distance in home environment: 50   Problem: RH Stairs Goal: LTG Patient will ambulate up and down stairs w/assist (PT) Description: LTG: Patient will ambulate up and down # of stairs with assistance (PT) Flowsheets (Taken 09/22/2020 1916) LTG: Pt will ambulate up/down stairs assist needed:: Supervision/Verbal cueing LTG: Pt will  ambulate up and down number of stairs: 4

## 2020-09-22 NOTE — Plan of Care (Signed)
Problem: RH Balance Goal: LTG Patient will maintain dynamic standing with ADLs (OT) Description: LTG:  Patient will maintain dynamic standing balance with assist during activities of daily living (OT)  Flowsheets (Taken 09/22/2020 1243) LTG: Pt will maintain dynamic standing balance during ADLs with: Independent with assistive device   Problem: Sit to Stand Goal: LTG:  Patient will perform sit to stand in prep for activites of daily living with assistance level (OT) Description: LTG:  Patient will perform sit to stand in prep for activites of daily living with assistance level (OT) Flowsheets (Taken 09/22/2020 1243) LTG: PT will perform sit to stand in prep for activites of daily living with assistance level: Independent with assistive device   Problem: RH Eating Goal: LTG Patient will perform eating w/assist, cues/equip (OT) Description: LTG: Patient will perform eating with assist, with/without cues using equipment (OT) Flowsheets (Taken 09/22/2020 1243) LTG: Pt will perform eating with assistance level of: Independent with assistive device    Problem: RH Grooming Goal: LTG Patient will perform grooming w/assist,cues/equip (OT) Description: LTG: Patient will perform grooming with assist, with/without cues using equipment (OT) Flowsheets (Taken 09/22/2020 1243) LTG: Pt will perform grooming with assistance level of: Independent with assistive device    Problem: RH Bathing Goal: LTG Patient will bathe all body parts with assist levels (OT) Description: LTG: Patient will bathe all body parts with assist levels (OT) Flowsheets (Taken 09/22/2020 1243) LTG: Pt will perform bathing with assistance level/cueing: Supervision/Verbal cueing   Problem: RH Dressing Goal: LTG Patient will perform upper body dressing (OT) Description: LTG Patient will perform upper body dressing with assist, with/without cues (OT). Flowsheets (Taken 09/22/2020 1243) LTG: Pt will perform upper body dressing with  assistance level of: Independent with assistive device Goal: LTG Patient will perform lower body dressing w/assist (OT) Description: LTG: Patient will perform lower body dressing with assist, with/without cues in positioning using equipment (OT) Flowsheets (Taken 09/22/2020 1243) LTG: Pt will perform lower body dressing with assistance level of: Independent with assistive device   Problem: RH Toileting Goal: LTG Patient will perform toileting task (3/3 steps) with assistance level (OT) Description: LTG: Patient will perform toileting task (3/3 steps) with assistance level (OT)  Flowsheets (Taken 09/22/2020 1243) LTG: Pt will perform toileting task (3/3 steps) with assistance level: Independent with assistive device   Problem: RH Functional Use of Upper Extremity Goal: LTG Patient will use RT/LT upper extremity as a (OT) Description: LTG: Patient will use right/left upper extremity as a stabilizer/gross assist/diminished/nondominant/dominant level with assist, with/without cues during functional activity (OT) Flowsheets (Taken 09/22/2020 1243) LTG: Use of upper extremity in functional activities: LUE as gross assist level LTG: Pt will use upper extremity in functional activity with assistance level of: Independent with assistive device   Problem: RH Simple Meal Prep Goal: LTG Patient will perform simple meal prep w/assist (OT) Description: LTG: Patient will perform simple meal prep with assistance, with/without cues (OT). Flowsheets (Taken 09/22/2020 1243) LTG: Pt will perform simple meal prep with assistance level of: Independent with assistive device   Problem: RH Toilet Transfers Goal: LTG Patient will perform toilet transfers w/assist (OT) Description: LTG: Patient will perform toilet transfers with assist, with/without cues using equipment (OT) Flowsheets (Taken 09/22/2020 1243) LTG: Pt will perform toilet transfers with assistance level of: Independent with assistive device   Problem:  RH Tub/Shower Transfers Goal: LTG Patient will perform tub/shower transfers w/assist (OT) Description: LTG: Patient will perform tub/shower transfers with assist, with/without cues using equipment (OT) Flowsheets (Taken 09/22/2020  1243) LTG: Pt will perform tub/shower stall transfers with assistance level of: Supervision/Verbal cueing
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
## 2020-09-22 NOTE — Progress Notes (Signed)
Inpatient Rehabilitation Care Coordinator Assessment and Plan Patient Details  Name: Mackenzie Gonzalez MRN: [id number] Date of Birth: [birth year]  Today's Date: 09/22/2020  Hospital Problems: Principal Problem:   Right pontine cerebrovascular accident [facility])  Past Medical History:  Past Medical History:  Diagnosis Date   Anxiety    Bipolar 1 disorder ([place])    Chronic bronchitis ([place])    Hepatitis C carrier ([place])    HIV (human immunodeficiency virus infection) ([place]) 2015   HIV (human immunodeficiency virus infection) ([place])    Psychiatric disorder    reported h/o schizoaffective, bipolar, anxiety   Schizoaffective disorder ([place])    Past Surgical History:  Past Surgical History:  Procedure Laterality Date   BIOPSY  06/18/2020   Procedure: BIOPSY;  Surgeon: [name], MD;  Location: [place];  Service: Gastroenterology;;   BREAST EXCISIONAL BIOPSY Left 1987   benign   ESOPHAGOGASTRODUODENOSCOPY (EGD) WITH PROPOFOL N/A 06/18/2020   Procedure: ESOPHAGOGASTRODUODENOSCOPY (EGD) WITH PROPOFOL;  Surgeon: [name], MD;  Location: [place];  Service: Gastroenterology;  Laterality: N/A;   I & D EXTREMITY Right 06/16/2020   Procedure: Open debridement of skin subcutaneous tissue and bone associated with open grade 2 fracture right hand;Radiographs 3 views right hand Complex wound closure degloving injury dorsal aspect of the hand greater than 10 cm. Open reduction and internal fixation right small finger metacarpal shaft ;  Surgeon: [name], MD;  Location: [place];  Service: Orthopedics;  Laterality: Right;   ORIF WRIST FRACTURE Left 06/16/2020   Procedure: Open reduction internal fixation displaced intra-articular distal radius fracture left wrist 3 more fragments; Radiographs 3 views left wrist Left wrist brachioadialis tendon release, tendon tenotomy;  Surgeon: [name], MD;  Location: [place];  Service: Orthopedics;  Laterality: Left;   SP ARTHRO WRIST*R*     WRIST  SURGERY     Social History:  reports that she has been smoking cigarettes. She has a 24.00 pack-year smoking history. She has never used smokeless tobacco. She reports that she does not currently use alcohol. She reports that she does not currently use drugs after having used the following drugs: "Crack" cocaine and IV.  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 Guardian/Conservator: n/a   Abuse/Neglect Abuse/Neglect Assessment Can Be Completed: Yes Physical Abuse: Denies Verbal Abuse: Denies Sexual Abuse: Denies Exploitation of patient/patient's resources: Denies Self-Neglect: Denies  Patient response to: Social Isolation - How often do you feel lonely or isolated from those around you?: Never  Emotional Status Recent Psychosocial Issues: anxiety/bipolar, Tobacco and substance abuse Psychiatric History: schizoaffectice, bipolar, anxiety Substance Abuse History: chronic Tobacco and poly substance abuse  Patient / Family Perceptions, Expectations & Goals Pt/Family understanding of illness & functional limitations: yes Premorbid pt/family roles/activities: Patient was independent prior to accident on 5/22 Anticipated changes in roles/activities/participation: Requires some assistance Pt/family expectations/goals: MOD I - [place]: None Premorbid Home Care/DME Agencies: None Transportation available at discharge: Family able to transport Is the patient able to respond to transportation  needs?: Yes In the past 12 months, has lack of transportation kept you from medical appointments or from getting medications?: No In the past 12 months, has lack of transportation kept you from meetings, work, or from getting things needed for daily living?: No Resource referrals recommended: Neuropsychology (anxiety/bipolar, Tobacco and substance abuse)  Discharge Planning Living Arrangements: Other relatives (Lives with sister) Support Systems: Children, Other relatives Type of Residence: Private residence (1 level home, 1 step to enter) Insurance underwriter Resources: [employer] (specify county) Pensions consultant: Family Support Financial Screen Referred: No Living Expenses: Lives with family Money Management: Patient, Other (Comment) (sister) Does the patient have any problems obtaining your medications?: No Home Management: Sister assists Patient/Family Preliminary Plans: Sister to continue Care Coordinator Anticipated Follow Up Needs: HH/OP Expected length of stay: 10-12 Days  Clinical Impression SW met wit patient, introduced self, explained role and process. Pt plans to d/c back home with sister. 1 level home, 1 step. No additional questions or concerns. SW will cont to follow up.   Dyanne Iha 09/22/2020, 12:38 PM

## 2020-09-23 DIAGNOSIS — I6350 Cerebral infarction due to unspecified occlusion or stenosis of unspecified cerebral artery: Secondary | ICD-10-CM | POA: Diagnosis not present

## 2020-09-23 NOTE — Progress Notes (Signed)
Occupational Therapy Session Note  Patient Details  Name: Mackenzie Gonzalez MRN: [id number] Date of Birth: 1960-03-13  Today's Date: 09/23/2020 OT Individual Time: 0312-8118 OT Individual Time Calculation (min): 53 min + 30 min   Short Term Goals: Week 1:  OT Short Term Goal 1 (Week 1): Pt will complete ambulatory toilet transfer with CGA + RW. OT Short Term Goal 2 (Week 1): Pt will completed LBD with CGA. OT Short Term Goal 3 (Week 1): Pt will demonstrate appropriate RW use with ind in 2/3 trials.  Skilled Therapeutic Interventions/Progress Updates:    Session [phone]): Pt received seated in w/c attempting to get dressed, denies pain, agreeable to therapy. Session focus on self-care retraining, activity tolerance, LUE NMR, CVA risk education, func transfers in prep for improved ADL/IADL/func mobility performance + decreased caregiver burden. Short amb transfer > toilet > TTB with min A 2/2 LLE ataxic movements and cues for safe RW + L saddle splint use. Bathed UB with S, LB with min A for thoroughness of buttocks. Stand-pivot no AD > w/c with min HHA. Completed UBD with light min A to thread LUE + cues for hemi-technique. Donned underwear/pants with light min A to pull up in the back and to thread LLE. Completed oral care seated with S and cues to stabilize toothbrush in L hand - squeeze toothpaste with R to avoid spillage. Donned B socks with S. Self-propelled w/c ~ 30 ft with S and increased time, cues for technique. Total A w/c transport 2/2 fatigue and time management. Completed 2x10 of the following with 2 lb dowel rod + ace wrap to facilitate L grasp: forward/backward circles, B shoulder flexion and hold, chest press. Pt cont to be motivated to incorporate LUE functionally.  Reviewed CVA stroke risks/modifiable lifestyle factors. Will cont to benefit from further education as pt reports poor diet/difficulty quitting smoking. Is interested in receiving nicotine patch, will notify LPN.    Pt  left seated in w/c with safety belt alarm engaged, call bell in reach, and all immediate needs met.    Session 2 [phone]): Pt received seated in w/c, no c/o pain, agreeable to therapy. Session focus on self-care retraining, activity tolerance, LUE NMR, psychosocial health, func transfers in prep for improved ADL/IADL/func mobility performance + decreased caregiver burden. Req to go outside, pt appreciate and demonstrating improved affect post session. Total A w/c transport to and from outdoor patio 2/2 time constraints and energy conservation. Issued least resistive theraputty and pt completed 1x5 of the following exercises:  digit flexion, putty squeeze, rolling logs, and flattening out the putty. Therapeutic use of self utilized throughout for emotional support/to build therapeutic rapport as pt relates difficulties in childhood/with family re DC. Relates that sister says she cannot come home unless she "is pretty good" and that someone will need to check on her the days her sisters work. States she has a step-mom who is able, but that she is also in poor health. Stand-pivot back to bed min HHA.  Pt left seated EOB with bed alarm engaged, call bell in reach, and all immediate needs met.    Therapy Documentation Precautions:  Precautions Precautions: Fall Precaution Comments: Left hemi, decreased safety awareness Restrictions Weight Bearing Restrictions: No  Pain: ongoing R hand pain , did not rate   ADL: See Care Tool for more details.   Therapy/Group: Individual Therapy  Volanda Napoleon MS, OTR/L  09/23/2020, 6:35 AM

## 2020-09-23 NOTE — Progress Notes (Signed)
Physical Therapy Session Note  Patient Details  Name: Mackenzie Gonzalez MRN: [id number] Date of Birth: July 28, 1960  Today's Date: 09/23/2020 PT Individual Time:  -      Short Term Goals: Week 1:  PT Short Term Goal 1 (Week 1): Pt will complete bed mobility ModI PT Short Term Goal 2 (Week 1): Pt will complete sit <> stands CGA PT Short Term Goal 3 (Week 1): Pt will ambulate 68f with CGA PT Short Term Goal 4 (Week 1): Pt will complete stairs with MinA PT Short Term Goal 5 (Week 1): PT will complete a BERG Balance  Skilled Therapeutic Interventions/Progress Updates:   Pt received sitting in WC and agreeable to PT. [facility]stand transfer training with RW and no AD with CGA. Stand pivot transfers performed without AD and min assist throughout session.   Gait training with RW x 788fwith min assist. Then performed without AD x 6018fnd HHA on the L/min assist.   Dynamic gait training with no AD to weave through 8 cones, step over 4 1 inch obstacles on floor and up down 4inch step x 2. Min assist throughout from PT for safety with cues for improved posture and activation of L glute med. Short therapeutic rest break between bouts.  Forward/reverse in parallel bars [id number] each direction with min-mod assist from PT and instruction for improved posture and step length bil.   Dynamic balance training without AD. Foot taps on 4 inch step x 10. Foot tap to target on 4inch step. Min assist from PT for improved posture, weight shift, and instruction  for decreased speed and attention   Nustep reciprocal movement training x 6 minutes with cues for attention to the LUE intermittently as well as neutral hip ER/IR on the L. Level 5 throughout.   Toilet transfers with min assist for continent bowel movement. Pt able to perform clothing management with mod A and pericare with supervision assist.   [facility] returned to [facility] and [facility] reach and all  needs met.          Therapy Documentation Precautions:  Precautions Precautions: Fall Precaution Comments: Left hemi, decreased safety awareness Restrictions Weight Bearing Restrictions: No General:   Vital Signs:   Pain:   Mobility:   Locomotion :    Trunk/Postural Assessment :    Balance:   Exercises:   Other Treatments:      Therapy/Group: Individual Therapy  AustLorie Phenix7/2022, 8:04 AM

## 2020-09-23 NOTE — Progress Notes (Signed)
Physical Therapy Session Note  Patient Details  Name: Mackenzie Gonzalez MRN: [id number] Date of Birth: 1960/03/04  Today's Date: 09/23/2020 PT Individual Time: [id number] PT Individual Time Calculation (min): 59 min   Short Term Goals: Week 1:  PT Short Term Goal 1 (Week 1): Pt will complete bed mobility ModI PT Short Term Goal 2 (Week 1): Pt will complete sit <> stands CGA PT Short Term Goal 3 (Week 1): Pt will ambulate 89f with CGA PT Short Term Goal 4 (Week 1): Pt will complete stairs with MinA PT Short Term Goal 5 (Week 1): PT will complete a BERG Balance  Skilled Therapeutic Interventions/Progress Updates:  Patient seated upright in w/c on entrance to room. Patient alert and agreeable to PT session. Patient with no pain complaint throughout session.  Therapeutic Activity: Transfers: Patient performed STS transfers improving form Min A to CGA by end of session following NMR. SPVT transfers performed with reduced levels of MinA throughout session. Provided vc/tc for technique/ positioning.  Gait Training:  Patient ambulated 125 feet using RW with L hand AE grip and Min A/ CGA improving to CGA. Demonstrated compensatory lean to R requiring vc/ tc for correction as well as further cueing for increasing L step height/ length as pt demos fatigue. Education provided that pt will have to ambulate with conscious practice for use of LLE at this time before she gets to the point of walking without having to think about the movements required again.   Neuromuscular Re-ed: NMR facilitated during session with focus on motor control, muscle activation, balance, proprioception and reciprocating movements. Pt guided in partial rises from seated position halfway to full stance as well as full minisquats from standing in order to activate L extensor chain. Pt guided in weight shifting from one foot to point of full WB and minimal lift to contralateral LE in order to improve proprioception of balance, BOS,  and point of need for movement to reciprocate for pre-gait training. Standing balance challenged with self perturbations in arm movements, holding arms outstretched with 2# weighted bar and pt resisting PT added pressures. Pt also performs potstirrers using both BUE with vc for large motions and then "kayak paddles" using 2# weight bar with pt demonstrating good ROM bilaterally with good trunk rotation and shoulder mobility.  NMR performed for improvements in motor control and coordination, balance, sequencing, judgement, and self confidence/ efficacy in performing all aspects of mobility at highest level of independence.   Patient seated upright  in w/c at end of session with brakes locked, belt alarm set, and all needs within reach. Oriented to time, upcoming lunch and time of next therapy session with OT.      Therapy Documentation Precautions:  Precautions Precautions: Fall Precaution Comments: Left hemi, decreased safety awareness Restrictions Weight Bearing Restrictions: No  Therapy/Group: Individual Therapy  JAlger SimonsPT, DPT 09/23/2020, 1:32 PM

## 2020-09-23 NOTE — Progress Notes (Signed)
PROGRESS NOTE   Subjective/Complaints:  Pt reports she's bored- per her and therapist- LBM 2-3 daysa go- normal for her.  Likes to stay busy, FYI.   ROS:  Pt denies SOB, abd pain, CP, N/V/C/D, and vision changes    Objective:   No results found. Recent Labs    09/22/20 0523  WBC 4.2  HGB 12.5  HCT 37.2  PLT 169   Recent Labs    09/22/20 0523  NA 139  K 4.0  CL 105  CO2 24  GLUCOSE 101*  BUN 19  CREATININE 0.88  CALCIUM 9.5    Intake/Output Summary (Last 24 hours) at 09/23/2020 1522 Last data filed at 09/23/2020 1249 Gross per 24 hour  Intake 520 ml  Output --  Net 520 ml       Physical Exam: Vital Signs Blood pressure (!) 166/132, pulse 85, temperature 98.7 F (37.1 C), temperature source Oral, resp. rate 16, height '5\' 5"'$  (1.651 m), weight 82.7 kg, SpO2 97 %. Elevated BP noted   General: awake, alert, appropriate, NAD HENT: conjugate gaze; oropharynx moist CV: regular rate; no JVD Pulmonary: CTA B/L; no W/R/R- good air movement GI: soft, NT, ND, (+)BS; hypoactive; protuberant Psychiatric: appropriate; sounds "bored" Neurological: alert . Extremities: No clubbing, cyanosis, or edema. Pulses are 2+ Psych: Pt's affect is appropriate. Pt is cooperative Skin: vitiligo throughout. A few tats. Old scars. Otherwise clean and dry. Neuro:  Alert and oriented x 3. Normal insight and awareness. Intact Memory. Normal language and speech. Cranial nerve exam unremarkable except for left central 7. Speech sl dysarthric. LUE 3+/5 prox to distal. LLE 3+ to 4/5 prox to distal. RUE and RLE 5/5. No sensory deficits. No abnl tone.  Musculoskeletal: Full ROM, No pain with AROM or PROM in the neck, trunk, or extremities. Posture appropriate. Right wrist hardware evident beneath skin    Assessment/Plan: 1. Functional deficits which require 3+ hours per day of interdisciplinary therapy in a comprehensive inpatient  rehab setting. Physiatrist is providing close team supervision and 24 hour management of active medical problems listed below. Physiatrist and rehab team continue to assess barriers to discharge/monitor patient progress toward functional and medical goals  Care Tool:  Bathing    Body parts bathed by patient: Left arm, Chest, Abdomen, Front perineal area, Right upper leg, Left upper leg, Face   Body parts bathed by helper: Right arm, Buttocks, Right lower leg, Left lower leg     Bathing assist Assist Level: Minimal Assistance - Patient > 75%     Upper Body Dressing/Undressing Upper body dressing   What is the patient wearing?: Pull over shirt    Upper body assist Assist Level: Moderate Assistance - Patient 50 - 74%    Lower Body Dressing/Undressing Lower body dressing      What is the patient wearing?: Underwear/pull up     Lower body assist Assist for lower body dressing: Minimal Assistance - Patient > 75%     Toileting Toileting    Toileting assist Assist for toileting: Independent with assistive device     Transfers Chair/bed transfer  Transfers assist     Chair/bed transfer assist level: Minimal Assistance - Patient >  75%     Locomotion Ambulation   Ambulation assist      Assist level: Moderate Assistance - Patient 50 - 74% Assistive device: Walker-rolling Max distance: 55   Walk 10 feet activity   Assist     Assist level: Minimal Assistance - Patient > 75% Assistive device: Walker-rolling   Walk 50 feet activity   Assist    Assist level: Moderate Assistance - Patient - 50 - 74% Assistive device: Walker-rolling    Walk 150 feet activity   Assist Walk 150 feet activity did not occur: Safety/medical concerns         Walk 10 feet on uneven surface  activity   Assist Walk 10 feet on uneven surfaces activity did not occur: Safety/medical concerns         Wheelchair     Assist Is the patient using a wheelchair?: No       Wheelchair assist level: Minimal Assistance - Patient > 75%      Wheelchair 50 feet with 2 turns activity    Assist        Assist Level: Minimal Assistance - Patient > 75%   Wheelchair 150 feet activity     Assist      Assist Level: Minimal Assistance - Patient > 75%   Blood pressure (!) 166/132, pulse 85, temperature 98.7 F (37.1 C), temperature source Oral, resp. rate 16, height '5\' 5"'$  (1.651 m), weight 82.7 kg, SpO2 97 %.  Medical Problem List and Plan: 1.  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  Neurology plans 30-day cardiac event monitor             -patient may  shower             -ELOS/Goals: 10-12 days supervision to mod I  -Continue CIR- PT, OT and SLP  2.  Antithrombotics: -DVT/anticoagulation:  Mechanical: Sequential compression devices, below knee Bilateral lower extremities             -antiplatelet therapy: Aspirin 81 mg daily and Plavix 75 mg daily x3 weeks then aspirin alone 3. Pain Management: Tylenol as needed, Robaxin as needed 4. Mood/bipolar disorder: Celexa 20 mg daily, Xanax 0.5 mg 3 times daily as needed             -antipsychotic agents: Seroquel 300 mg nightly 5. Neuropsych: This patient is capable of making decisions on her own behalf. 6. Skin/Wound Care: Routine skin checks 7. Fluids/Electrolytes/Nutrition: encourage PO  I personally reviewed all of the patient's labs today, and lab work is within normal limits.  8.  HIV.Biktarvy 50-2 100-25 mg daily 9.  History of tobacco as well as polysubstance abuse.  Urine drug screen positive marijuana.  Continue NicoDerm patch.  Provide counseling 10.  Hyperlipidemia.  Lipitor 11.  IBS.  Continue Linzess  -last moved bowels 3 days ago  -will give sorbitol today. Add scheduled colace as well  8/27- 2 Bms after I saw her this AM- from sorbitol she took yesterday- will d/c today's Sorbitol that was ordered 12. R wrist fx s/p hardware placement- needs to have  pins removed (out of place)- surgery was scheduled 9/7- will reach out to surgery to reschedule 13- Vitiligo- no issues, but skin can be more sensitive.  14. Urinary issues- dc purewick  -oob to void  8/27- doing well per nursing- con't OOB    LOS: 2 days A FACE TO FACE EVALUATION WAS PERFORMED  Sevin Langenbach 09/23/2020, 3:22 PM

## 2020-09-24 DIAGNOSIS — I6350 Cerebral infarction due to unspecified occlusion or stenosis of unspecified cerebral artery: Secondary | ICD-10-CM | POA: Diagnosis not present

## 2020-09-24 NOTE — Progress Notes (Signed)
PROGRESS NOTE   Subjective/Complaints:  Pt reports having pain in LUQ_ has Bentyl at home for this- suggested she ask for one- can also ask for Robaxin prn.  Also she wasn't sure if wanted some tylenol- told her try the 2 meds and then wait on tylenol.   ROS:   Pt denies SOB, (+) LUQ abd pain, CP, N/V/C/D, and vision changes    Objective:   No results found. Recent Labs    09/22/20 0523  WBC 4.2  HGB 12.5  HCT 37.2  PLT 169   Recent Labs    09/22/20 0523  NA 139  K 4.0  CL 105  CO2 24  GLUCOSE 101*  BUN 19  CREATININE 0.88  CALCIUM 9.5    Intake/Output Summary (Last 24 hours) at 09/24/2020 1520 Last data filed at 09/24/2020 1309 Gross per 24 hour  Intake 607 ml  Output --  Net 607 ml       Physical Exam: Vital Signs Blood pressure 127/81, pulse 62, temperature 98.6 F (37 C), temperature source Oral, resp. rate 20, height '5\' 5"'$  (1.651 m), weight 82.7 kg, SpO2 98 %. Elevated BP noted    General: awake, alert, appropriate, sitting up in bed; disjointed in responses sometimes; NAD HENT: conjugate gaze; oropharynx moist CV: regular rate; no JVD Pulmonary: CTA B/L; no W/R/R- good air movement GI: soft, ND, (+)BS; slightly TTP in LUQ- no rebound Psychiatric: appropriate; more interactive Neurological: alert Extremities: No clubbing, cyanosis, or edema. Pulses are 2+ Psych: Pt's affect is appropriate. Pt is cooperative Skin: vitiligo throughout. A few tats. Old scars. Otherwise clean and dry. Neuro:  Alert and oriented x 3. Normal insight and awareness. Intact Memory. Normal language and speech. Cranial nerve exam unremarkable except for left central 7. Speech sl dysarthric. LUE 3+/5 prox to distal. LLE 3+ to 4/5 prox to distal. RUE and RLE 5/5. No sensory deficits. No abnl tone.  Musculoskeletal: Full ROM, No pain with AROM or PROM in the neck, trunk, or extremities. Posture appropriate. Right wrist  hardware evident beneath skin    Assessment/Plan: 1. Functional deficits which require 3+ hours per day of interdisciplinary therapy in a comprehensive inpatient rehab setting. Physiatrist is providing close team supervision and 24 hour management of active medical problems listed below. Physiatrist and rehab team continue to assess barriers to discharge/monitor patient progress toward functional and medical goals  Care Tool:  Bathing    Body parts bathed by patient: Left arm, Chest, Abdomen, Front perineal area, Right upper leg, Left upper leg, Face   Body parts bathed by helper: Right arm, Buttocks, Right lower leg, Left lower leg     Bathing assist Assist Level: Minimal Assistance - Patient > 75%     Upper Body Dressing/Undressing Upper body dressing   What is the patient wearing?: Pull over shirt    Upper body assist Assist Level: Independent    Lower Body Dressing/Undressing Lower body dressing      What is the patient wearing?: Pants     Lower body assist Assist for lower body dressing: Minimal Assistance - Patient > 75%     Toileting Toileting    Toileting assist Assist  for toileting: Independent with assistive device     Transfers Chair/bed transfer  Transfers assist     Chair/bed transfer assist level: Minimal Assistance - Patient > 75%     Locomotion Ambulation   Ambulation assist      Assist level: Moderate Assistance - Patient 50 - 74% Assistive device: Walker-rolling Max distance: 55   Walk 10 feet activity   Assist     Assist level: Minimal Assistance - Patient > 75% Assistive device: Walker-rolling   Walk 50 feet activity   Assist    Assist level: Moderate Assistance - Patient - 50 - 74% Assistive device: Walker-rolling    Walk 150 feet activity   Assist Walk 150 feet activity did not occur: Safety/medical concerns         Walk 10 feet on uneven surface  activity   Assist Walk 10 feet on uneven surfaces  activity did not occur: Safety/medical concerns         Wheelchair     Assist Is the patient using a wheelchair?: No      Wheelchair assist level: Minimal Assistance - Patient > 75%      Wheelchair 50 feet with 2 turns activity    Assist        Assist Level: Minimal Assistance - Patient > 75%   Wheelchair 150 feet activity     Assist      Assist Level: Minimal Assistance - Patient > 75%   Blood pressure 127/81, pulse 62, temperature 98.6 F (37 C), temperature source Oral, resp. rate 20, height '5\' 5"'$  (1.651 m), weight 82.7 kg, SpO2 98 %.  Medical Problem List and Plan: 1.  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  Neurology plans 30-day cardiac event monitor             -patient may  shower             -ELOS/Goals: 10-12 days supervision to mod I  -Continue CIR- PT, OT and SLP 2.  Antithrombotics: -DVT/anticoagulation:  Mechanical: Sequential compression devices, below knee Bilateral lower extremities             -antiplatelet therapy: Aspirin 81 mg daily and Plavix 75 mg daily x3 weeks then aspirin alone 3. Pain Management: Tylenol as needed, Robaxin as needed  8/28- having LUQ pain- said due to diverticulitis- suggested taking prn Bentyl first.  4. Mood/bipolar disorder: Celexa 20 mg daily, Xanax 0.5 mg 3 times daily as needed             -antipsychotic agents: Seroquel 300 mg nightly 5. Neuropsych: This patient is capable of making decisions on her own behalf. 6. Skin/Wound Care: Routine skin checks 7. Fluids/Electrolytes/Nutrition: encourage PO  I personally reviewed all of the patient's labs today, and lab work is within normal limits.  8.  HIV.Biktarvy 50-2 100-25 mg daily 9.  History of tobacco as well as polysubstance abuse.  Urine drug screen positive marijuana.  Continue NicoDerm patch.  Provide counseling 10.  Hyperlipidemia.  Lipitor 11.  IBS.  Continue Linzess  -last moved bowels 3 days ago  -will give  sorbitol today. Add scheduled colace as well  8/27- 2 Bms after I saw her this AM- from sorbitol she took yesterday- will d/c today's Sorbitol that was ordered 12. R wrist fx s/p hardware placement- needs to have pins removed (out of place)- surgery was scheduled 9/7- will reach out to surgery to reschedule 13- Vitiligo- no issues, but skin can  be more sensitive.  14. Urinary issues- dc purewick  -oob to void  8/27- doing well per nursing- con't OOB    LOS: 3 days A FACE TO FACE EVALUATION WAS PERFORMED  Nicolae Vasek 09/24/2020, 3:20 PM

## 2020-09-25 ENCOUNTER — Other Ambulatory Visit: Payer: Self-pay | Admitting: Cardiology

## 2020-09-25 DIAGNOSIS — I639 Cerebral infarction, unspecified: Secondary | ICD-10-CM

## 2020-09-25 DIAGNOSIS — B20 Human immunodeficiency virus [HIV] disease: Secondary | ICD-10-CM

## 2020-09-25 DIAGNOSIS — I6350 Cerebral infarction due to unspecified occlusion or stenosis of unspecified cerebral artery: Secondary | ICD-10-CM | POA: Diagnosis not present

## 2020-09-25 DIAGNOSIS — K581 Irritable bowel syndrome with constipation: Secondary | ICD-10-CM

## 2020-09-25 MED ORDER — SENNOSIDES-DOCUSATE SODIUM 8.6-50 MG PO TABS
1.0000 | ORAL_TABLET | Freq: Two times a day (BID) | ORAL | Status: DC
  Administered 2020-09-25 – 2020-10-04 (×19): 1 via ORAL
  Filled 2020-09-25 (×19): qty 1

## 2020-09-25 MED ORDER — DICYCLOMINE HCL 10 MG PO CAPS
10.0000 mg | ORAL_CAPSULE | Freq: Three times a day (TID) | ORAL | Status: DC
  Administered 2020-09-25 – 2020-10-04 (×26): 10 mg via ORAL
  Filled 2020-09-25 (×27): qty 1

## 2020-09-25 NOTE — Progress Notes (Signed)
Physical Therapy Session Note  Patient Details  Name: Mackenzie Gonzalez MRN: [id number] Date of Birth: [birth year]  Today's Date: 09/25/2020 PT Individual Time: 1300-1410 PT Individual Time Calculation (min): 70 min   Short Term Goals: Week 1:  PT Short Term Goal 1 (Week 1): Pt will complete bed mobility ModI PT Short Term Goal 2 (Week 1): Pt will complete sit <> stands CGA PT Short Term Goal 3 (Week 1): Pt will ambulate 88f with CGA PT Short Term Goal 4 (Week 1): Pt will complete stairs with MinA PT Short Term Goal 5 (Week 1): PT will complete a BERG Balance  Skilled Therapeutic Interventions/Progress Updates:    Pt seated in w/c on arrival and agreeable to therapy. No complaint of pain. Pt requested to go outside during this session. Pt propelled w/c with BUE x500+ ft with supervision. Pt able to perform longer strokes but needed frequent cueing to maintain. Demoed decr power in L arm leading to drift toward that side. Pt then ambulated in outdoor environment up to 300 ft with CGA and RW before needing a rest break. Pt navigated slopes and varied surfaces, including grass, without LOB. Pt navigated 6" stairs x 4 and x 10 with CGA and 1 handrail for improved community access. Pt then participated in finding objects in gift shop, requiring cognitive engagement while ambulating in tight spaces. Pt demoed slight crouched posture during gait that worsened with fatigue and distraction. Pt returned to room after session and remained in w/c, was left with all needs in reach and alarm active.   Therapy Documentation Precautions:  Precautions Precautions: Fall Precaution Comments: Left hemi, decreased safety awareness Restrictions Weight Bearing Restrictions: No     Therapy/Group: Individual Therapy  OMickel Fuchs8/29/2022, 4:12 PM

## 2020-09-25 NOTE — Progress Notes (Signed)
Physical Therapy Session Note  Patient Details  Name: Mackenzie Gonzalez MRN: [id number] Date of Birth: 1960/08/22  Today's Date: 09/25/2020 PT Individual Time: [id number] and 1505-1550 PT Individual Time Calculation (min): 74 min and 45 min  Short Term Goals: Week 1:  PT Short Term Goal 1 (Week 1): Pt will complete bed mobility ModI PT Short Term Goal 2 (Week 1): Pt will complete sit <> stands CGA PT Short Term Goal 3 (Week 1): Pt will ambulate 3f with CGA PT Short Term Goal 4 (Week 1): Pt will complete stairs with MinA PT Short Term Goal 5 (Week 1): PT will complete a BERG Balance  Skilled Therapeutic Interventions/Progress Updates:  Session 1: Patient seated upright in w/c on entrance to room. Patient alert and agreeable to PT session. Patient with no pain complaint throughout session.  Therapeutic Activity: Transfers: At start of session, pt guided in ambulatory transfer and stance at sink to wash face, comb and fix hair, brush teeth. CGA for balance and up to Min A for hold of toothpaste in order to twist off cap. Otherwise, pt supervision for most grooming.   Patient performed STS and SPVT transfers throughout session beginning with Min A for balance and progressing to CGA/ close supervision with vc/ tc for improved technique with equal weight bearing, forward lean, hands on thighs to complete rise and descent. Pt demos impulsive behavior with reaching out for armrests on w/c for UE support prior to completion of pivot to seat. .  Gait Training:  Patient ambulated 125' x1 using RW with light CGA. Demonstrated lean to R, increased lateral sway, decreased LLE step height/ length with fatigue. Provided vc/ tc along with NDT cueing with hands on ribcage for upright posturing throughout gait for minimizing lateral movement as well as maintaining upright posture. Improved quality of gait noted in ambulation trial with no AD as well as during dynamic gait activity using agility ladder.  Ambulation with no AD for 125' x1/ 841 x1 with improved upright posture, vc for continuous LLE floor clearance and heel strike. Good step through gait pattern.   Neuromuscular Re-ed: NMR facilitated during session with focus onstanding balance, dynamic gait, motor control/ planning, coordination, quality of movement. Pt guided in stepping activities using agility ladder. Pt guided in Bil step into each block initiating step with R, then L.... then reciprocating step into each box, then progressed to high knee stepping requiring guidance into appropriate weight shift and balance attainment prior to LE lift off, and ended with lateral stepping. Completed x3 in each direction for each task. VC/ tc provided for improved quality of balance and gait throughout. NDT support at ribcage for reduction in trunk ataxic movements with RLE stepping (LLE stance).  Lateral stepping progressed to pause with wide step outs in order to slow momentum build and increase muscle activation. NMR performed for improvements in motor control and coordination, balance, sequencing, judgement, and self confidence/ efficacy in performing all aspects of mobility at highest level of independence.   Patient seated upright  in w/c at end of session with brakes locked, belt alarm set, and all needs within reach.    Session 2: Patient seated upright in w/c on entrance to room. Patient alert and agreeable to PT session. Patient denied pain during session, but did relate fatigue from long outdoor session with other PT this afternoon.   Neuromuscular Re-ed: NMR facilitated during session with focus on standing balance. Pt guided in use of Wii through games requiring weight shift and  adjustments in balance A<>P and R<>L. Pt requiring time initially with each task to become accustomed to required balance adjustments to participate in several different challenges. Pt with biggest difficulty shown in weight shift over heels. NMR performed for  improvements in motor control and coordination, balance, sequencing, judgement, and self confidence/ efficacy in performing all aspects of mobility at highest level of independence.   Therapeutic Activity: Transfers: Patient performed STS and SPVT transfers throughout session with CGA. Provided verbal cues for reduced use of BUE in push from armrests and increased placement on anterior thighs. Good rise to stand. Improved completion of pivot turn prior to reach back with BUE.  Toilet transfer performed with supervision and vc for completion of turn using RW and use of BUE to assist with LB dressing in doffing and donning.   Bed Mobility: Patient performed sit--> supine at end of session with supervision/ Mod I. VC/ tc required for positioning.    Patient supine  in bed at end of session with brakes locked, bed alarm set, and all needs within reach.    Therapy Documentation Precautions:  Precautions Precautions: Fall Precaution Comments: Left hemi, decreased safety awareness Restrictions Weight Bearing Restrictions: No  Therapy/Group: Individual Therapy  Alger Simons PT, DPT 09/25/2020, 4:34 PM

## 2020-09-25 NOTE — Progress Notes (Signed)
Speech Language Pathology Daily Session Note  Patient Details  Name: Mackenzie Gonzalez MRN: [id number] Date of Birth: July 06, 1960  Today's Date: 09/25/2020 SLP Individual Time: 1030-1130 SLP Individual Time Calculation (min): 60 min  Short Term Goals: Week 1: SLP Short Term Goal 1 (Week 1): Patient will implement safe swallow strategies during PO intake with sup A verbal cues SLP Short Term Goal 2 (Week 1): Patient will demonstrate self monitoring and correction with speech intelligibility at sentence level with sup A verbal cues SLP Short Term Goal 3 (Week 1): Patient will identify 2 physical/cognitive deficits and verbalize 2 safety precautions associated with new limitations at sup A level SLP Short Term Goal 4 (Week 1): Patient will complete mildly complex problem solving with min A verbal cues SLP Short Term Goal 5 (Week 1): Patient will identify and demonstrate use of at least 2 beneficial memory strategies with sup A verbal cues SLP Short Term Goal 6 (Week 1): Patient will demonstrate selective attention to functional tasks in a mildly distracting environment with min A verbal cues to redirect  Skilled Therapeutic Interventions: Patient agreeable to skilled ST intervention with focus on speech, swallowing, and cognitive goals. SLP facilitated session by providing sup A verbal cues to implement speech intelligibility strategies at sentence level, and sup-to-min A verbal cues at conversation level. Speech intelligibility perceived as >90% at sentence level, and 75-90% intelligible at conversation level, at times >90% with improved awareness. Benefited from cues to reduce speech rate and over exaggerate articulation. Patient recalled safe swallow precautions and strategies with sup A verbal cues to elaborate (I.e., slow rate, small bites/sips, assess left buccal cavity for pocketing). Patient exhibited improved intellectual awareness of deficits by identifying physical and cognitive changes s/p CVA  (I.e., decreased strength, balance, decreased processing - stated "my mind moves faster than my body, I need to slow down.") Patient verbalized steps necessary to safely get to and from bathroom which involved pressing call bell, waiting for assistance because she is unable to walk unsupported, etc. Patient did indicate however that despite knowing the steps involved, it is still difficult to recall and implement these factors in the moment and she wants to continue to gain improved self awareness of her abilities and limitations. She inferred that acting "quickly" is something she has dealt with all her life. Lastly, engaged in discussion about medication management and patient's wishes use a pillbox at discharge. Facilitated use of medication chart to promote understanding of patient's current medications, dosage, and frequency. At baseline, patient was familiar with less than half of the medications she is currently prescribed. Will continue with medication management. Patient was left in wheelchair with alarm activated and immediate needs within reach at end of session. Continue per current plan of care.      Pain Pain Assessment Pain Scale: 0-10 Pain Score: 0-No pain Pain Type: Chronic pain Pain Location: Abdomen Pain Intervention(s): Medication (See eMAR)  Therapy/Group: Individual Therapy  Nissa Stannard T Areebah Meinders 09/25/2020, 11:30 AM

## 2020-09-25 NOTE — Progress Notes (Signed)
PROGRESS NOTE   Subjective/Complaints:  Stil with Left sided abdominal pain. Not moving bowels as much as she would like. Receiving meds she takes at home for IBS.  ROS: Patient denies fever, rash, sore throat, blurred vision, nausea, vomiting, diarrhea, cough, shortness of breath or chest pain, joint or back pain, headache, or mood change.     Objective:   No results found. No results for input(s): WBC, HGB, HCT, PLT in the last 72 hours.  No results for input(s): NA, K, CL, CO2, GLUCOSE, BUN, CREATININE, CALCIUM in the last 72 hours.   Intake/Output Summary (Last 24 hours) at 09/25/2020 1205 Last data filed at 09/25/2020 0700 Gross per 24 hour  Intake 337 ml  Output --  Net 337 ml       Physical Exam: Vital Signs Blood pressure 106/82, pulse 65, temperature 97.8 F (36.6 C), resp. rate 18, height '5\' 5"'$  (1.651 m), weight 82.7 kg, SpO2 99 %. Elevated BP noted    Constitutional: No distress . Vital signs reviewed. HEENT: NCAT, EOMI, oral membranes moist Neck: supple Cardiovascular: RRR without murmur. No JVD    Respiratory/Chest: CTA Bilaterally without wheezes or rales. Normal effort    GI/Abdomen: BS +,LLQ/flank tender with palpation. Non-distended Ext: no clubbing, cyanosis, or edema Psych: pleasant and cooperative  Skin: vitiligo throughout. A few tats. Old scars. Otherwise clean and dry. Neuro:  Alert and oriented x 3. Normal insight and awareness. Intact Memory. Normal language and speech. Cranial nerve exam unremarkable except for left central 7. Speech sl dysarthric. LUE 3+/5 prox to distal. LLE 3+ to 4/5 prox to distal--stable motor. RUE and RLE 5/5. No sensory deficits. No abnl tone.  Musculoskeletal: Full ROM, No pain with AROM or PROM in the neck, trunk, or extremities. Posture appropriate. Right wrist hardware evident beneath skin    Assessment/Plan: 1. Functional deficits which require 3+ hours per  day of interdisciplinary therapy in a comprehensive inpatient rehab setting. Physiatrist is providing close team supervision and 24 hour management of active medical problems listed below. Physiatrist and rehab team continue to assess barriers to discharge/monitor patient progress toward functional and medical goals  Care Tool:  Bathing    Body parts bathed by patient: Left arm, Chest, Abdomen, Front perineal area, Right upper leg, Left upper leg, Face   Body parts bathed by helper: Right arm, Buttocks, Right lower leg, Left lower leg     Bathing assist Assist Level: Minimal Assistance - Patient > 75%     Upper Body Dressing/Undressing Upper body dressing   What is the patient wearing?: Pull over shirt    Upper body assist Assist Level: Independent    Lower Body Dressing/Undressing Lower body dressing      What is the patient wearing?: Pants     Lower body assist Assist for lower body dressing: Minimal Assistance - Patient > 75%     Toileting Toileting    Toileting assist Assist for toileting: Independent with assistive device     Transfers Chair/bed transfer  Transfers assist     Chair/bed transfer assist level: Minimal Assistance - Patient > 75%     Locomotion Ambulation   Ambulation assist  Assist level: Moderate Assistance - Patient 50 - 74% Assistive device: Walker-rolling Max distance: 55   Walk 10 feet activity   Assist     Assist level: Minimal Assistance - Patient > 75% Assistive device: Walker-rolling   Walk 50 feet activity   Assist    Assist level: Moderate Assistance - Patient - 50 - 74% Assistive device: Walker-rolling    Walk 150 feet activity   Assist Walk 150 feet activity did not occur: Safety/medical concerns         Walk 10 feet on uneven surface  activity   Assist Walk 10 feet on uneven surfaces activity did not occur: Safety/medical concerns         Wheelchair     Assist Is the patient using  a wheelchair?: No Type of Wheelchair: Manual    Wheelchair assist level: Minimal Assistance - Patient > 75%      Wheelchair 50 feet with 2 turns activity    Assist        Assist Level: Minimal Assistance - Patient > 75%   Wheelchair 150 feet activity     Assist      Assist Level: Minimal Assistance - Patient > 75%   Blood pressure 106/82, pulse 65, temperature 97.8 F (36.6 C), resp. rate 18, height '5\' 5"'$  (1.651 m), weight 82.7 kg, SpO2 99 %.  Medical Problem List and Plan: 1.  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  Neurology plans 30-day cardiac event monitor             -patient may  shower             -ELOS/Goals: 10-12 days supervision to mod I  -Continue CIR therapies including PT, OT, and SLP  2.  Antithrombotics: -DVT/anticoagulation:  Mechanical: Sequential compression devices, below knee Bilateral lower extremities             -antiplatelet therapy: Aspirin 81 mg daily and Plavix 75 mg daily x3 weeks then aspirin alone 3. Pain Management: Tylenol as needed, Robaxin as needed   .see #11 4. Mood/bipolar disorder: Celexa 20 mg daily, Xanax 0.5 mg 3 times daily as needed             -antipsychotic agents: Seroquel 300 mg nightly 5. Neuropsych: This patient is capable of making decisions on her own behalf. 6. Skin/Wound Care: Routine skin checks 7. Fluids/Electrolytes/Nutrition: encourage PO  Po intake reasonable 8.  HIV.Biktarvy 50-2 100-25 mg daily 9.  History of tobacco as well as polysubstance abuse.  Urine drug screen positive marijuana.  Continue NicoDerm patch.  Provide counseling 10.  Hyperlipidemia.  Lipitor 11.  IBS/diverticulitis?.  Continue Linzess  -8/29 moved bowels 2 days ago  -change colace to senna-s  -schedule bentyl q8 for spasms 12. R wrist fx s/p hardware placement- needs to have pins removed (out of place)- surgery was scheduled 9/7- will reach out to surgery to reschedule 13- Vitiligo- no  issues, but skin can be more sensitive.  14. Urinary issues-  -oob to void, emptying       LOS: 4 days A FACE TO [place] 09/25/2020, 12:05 PM

## 2020-09-26 ENCOUNTER — Inpatient Hospital Stay: Payer: Medicaid Other

## 2020-09-26 ENCOUNTER — Inpatient Hospital Stay (HOSPITAL_COMMUNITY): Payer: Medicaid Other

## 2020-09-26 DIAGNOSIS — I6350 Cerebral infarction due to unspecified occlusion or stenosis of unspecified cerebral artery: Secondary | ICD-10-CM | POA: Diagnosis not present

## 2020-09-26 MED ORDER — MAGNESIUM HYDROXIDE 400 MG/5ML PO SUSP
5.0000 mL | Freq: Every day | ORAL | Status: DC
  Administered 2020-09-26 – 2020-10-04 (×9): 5 mL via ORAL
  Filled 2020-09-26 (×9): qty 30

## 2020-09-26 MED ORDER — ACETAMINOPHEN 325 MG PO TABS
650.0000 mg | ORAL_TABLET | Freq: Three times a day (TID) | ORAL | Status: DC
  Administered 2020-09-26 – 2020-10-04 (×22): 650 mg via ORAL
  Filled 2020-09-26 (×25): qty 2

## 2020-09-26 MED ORDER — ACETAMINOPHEN 650 MG RE SUPP
650.0000 mg | Freq: Three times a day (TID) | RECTAL | Status: DC
  Filled 2020-09-26 (×12): qty 1

## 2020-09-26 NOTE — Progress Notes (Signed)
LVM with [name] at Dr. Angus Palms office due to patient's surgery needing to be cancelled and rescheduled based on current hospitalization.

## 2020-09-26 NOTE — Progress Notes (Signed)
Speech Language Pathology Daily Session Note  Patient Details  Name: Mackenzie Gonzalez MRN: [id number] Date of Birth: [birth year]  Today's Date: 09/26/2020 SLP Individual Time: 1000-1100 SLP Individual Time Calculation (min): 60 min  Short Term Goals: Week 1: SLP Short Term Goal 1 (Week 1): Patient will implement safe swallow strategies during PO intake with sup A verbal cues SLP Short Term Goal 2 (Week 1): Patient will demonstrate self monitoring and correction with speech intelligibility at sentence level with sup A verbal cues SLP Short Term Goal 3 (Week 1): Patient will identify 2 physical/cognitive deficits and verbalize 2 safety precautions associated with new limitations at sup A level SLP Short Term Goal 4 (Week 1): Patient will complete mildly complex problem solving with min A verbal cues SLP Short Term Goal 5 (Week 1): Patient will identify and demonstrate use of at least 2 beneficial memory strategies with sup A verbal cues SLP Short Term Goal 6 (Week 1): Patient will demonstrate selective attention to functional tasks in a mildly distracting environment with min A verbal cues to redirect  Skilled Therapeutic Interventions: Patient agreeable to skilled ST intervention with focus on cognitive goals. SLP facilitated session by providing overall sup A verbal cues for medication management tasks involving problem solving scenarios, pill label comprehension, and BID pillbox organization. Patient required verbal explanation on how to appropriately handle situations including how/where to dispose of medications, what might you do if you forgot to take your medication, and what would you do if you ran out of refills. Increased support needed for comprehension of labels "take one pill twice daily" vs. "take two pills once daily." Patient both verbalized and demonstrated appropriately by end of session. Patient organized BID pillbox with error x1 in which patient identified and self corrected with sup  A verbal cues. Toward end of session, patient requested to use bathroom in which she required close sup A for safety 2/2 impulsiveness involving attempting to stand while wheelchair was still in motion, and standing prior to locking wheelchair breaks. Educated on safety with wheelchair in which patient responded "I know I need to slow down." Patient was left in wheelchair with alarm activated and immediate needs within reach at end of session. Continue per current plan of care.      Pain Pain Assessment Pain Scale: 0-10 Pain Score: 3  Pain Location: Abdomen Pain Orientation: Left Pain Descriptors / Indicators: Aching Pain Onset: On-going Pain Intervention(s): Medication (See eMAR)  Therapy/Group: Individual Therapy  Amaira Safley T Kortlyn Koltz 09/26/2020, 11:06 AM

## 2020-09-26 NOTE — Progress Notes (Signed)
PROGRESS NOTE   Subjective/Complaints: Continues to complaint of left sided abdominal pain- CT reviewed and shows diverticulosis in May- she had good sized BM yesterday but feels she may still be a little constipated  ROS: Patient denies fever, rash, sore throat, blurred vision, nausea, vomiting, diarrhea, cough, shortness of breath or chest pain, joint or back pain, headache, or mood change. +constipation  Objective:   No results found. No results for input(s): WBC, HGB, HCT, PLT in the last 72 hours.  No results for input(s): NA, K, CL, CO2, GLUCOSE, BUN, CREATININE, CALCIUM in the last 72 hours.   Intake/Output Summary (Last 24 hours) at 09/26/2020 0941 Last data filed at 09/26/2020 0730 Gross per 24 hour  Intake 460 ml  Output --  Net 460 ml       Physical Exam: Vital Signs Blood pressure 124/85, pulse 67, temperature 98.2 F (36.8 C), resp. rate 18, height '5\' 5"'$  (1.651 m), weight 82.7 kg, SpO2 100 %.  Constitutional: No distress . Vital signs reviewed. BMI 30.34 HEENT: NCAT, EOMI, oral membranes moist Neck: supple Cardiovascular: RRR without murmur. No JVD    Respiratory/Chest: CTA Bilaterally without wheezes or rales. Normal effort    GI/Abdomen: BS +,LLQ/flank tender with palpation. Non-distended Ext: no clubbing, cyanosis, or edema Psych: pleasant and cooperative  Skin: vitiligo throughout. A few tats. Old scars. Otherwise clean and dry. Neuro:  Alert and oriented x 3. Normal insight and awareness. Intact Memory. Normal language and speech. Cranial nerve exam unremarkable except for left central 7. Speech sl dysarthric. LUE 3+/5 prox to distal. LLE 3+ to 4/5 prox to distal--stable motor. RUE and RLE 5/5. No sensory deficits. No abnl tone.  Musculoskeletal: Full ROM, No pain with AROM or PROM in the neck, trunk, or extremities. Posture appropriate. Right wrist hardware evident beneath skin     Assessment/Plan: 1. Functional deficits which require 3+ hours per day of interdisciplinary therapy in a comprehensive inpatient rehab setting. Physiatrist is providing close team supervision and 24 hour management of active medical problems listed below. Physiatrist and rehab team continue to assess barriers to discharge/monitor patient progress toward functional and medical goals  Care Tool:  Bathing    Body parts bathed by patient: Left arm, Chest, Abdomen, Front perineal area, Right upper leg, Left upper leg, Face   Body parts bathed by helper: Right arm, Buttocks, Right lower leg, Left lower leg     Bathing assist Assist Level: Minimal Assistance - Patient > 75%     Upper Body Dressing/Undressing Upper body dressing   What is the patient wearing?: Pull over shirt    Upper body assist Assist Level: Independent    Lower Body Dressing/Undressing Lower body dressing      What is the patient wearing?: Pants     Lower body assist Assist for lower body dressing: Minimal Assistance - Patient > 75%     Toileting Toileting    Toileting assist Assist for toileting: Independent with assistive device     Transfers Chair/bed transfer  Transfers assist     Chair/bed transfer assist level: Contact Guard/Touching assist     Locomotion Ambulation   Ambulation assist  Assist level: Minimal Assistance - Patient > 75% Assistive device: No Device Max distance: 125   Walk 10 feet activity   Assist     Assist level: Contact Guard/Touching assist Assistive device: No Device   Walk 50 feet activity   Assist    Assist level: Contact Guard/Touching assist Assistive device: No Device    Walk 150 feet activity   Assist Walk 150 feet activity did not occur: Safety/medical concerns  Assist level: Contact Guard/Touching assist Assistive device: Walker-rolling    Walk 10 feet on uneven surface  activity   Assist Walk 10 feet on uneven surfaces  activity did not occur: Safety/medical concerns   Assist level: Contact Guard/Touching assist Assistive device: Walker-rolling   Wheelchair     Assist Is the patient using a wheelchair?: No Type of Wheelchair: Manual    Wheelchair assist level: Minimal Assistance - Patient > 75%      Wheelchair 50 feet with 2 turns activity    Assist        Assist Level: Minimal Assistance - Patient > 75%   Wheelchair 150 feet activity     Assist      Assist Level: Minimal Assistance - Patient > 75%   Blood pressure 124/85, pulse 67, temperature 98.2 F (36.8 C), resp. rate 18, height '5\' 5"'$  (1.651 m), weight 82.7 kg, SpO2 100 %.  Medical Problem List and Plan: 1.  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  Neurology plans 30-day cardiac event monitor             -patient may  shower             -ELOS/Goals: 10-12 days supervision to mod I  -Continue CIR therapies including PT, OT, and SLP  2.  Antithrombotics: -DVT/anticoagulation:  Mechanical: Sequential compression devices, below knee Bilateral lower extremities             -antiplatelet therapy: Aspirin 81 mg daily and Plavix 75 mg daily x3 weeks then aspirin alone 3. Pain Management: Tylenol as needed, Robaxin as needed   .see #11 4. Mood/bipolar disorder: Celexa 20 mg daily, Xanax 0.5 mg 3 times daily as needed             -antipsychotic agents: Seroquel 300 mg nightly 5. Neuropsych: This patient is capable of making decisions on her own behalf. 6. Skin/Wound Care: Routine skin checks 7. Fluids/Electrolytes/Nutrition: encourage PO  Po intake reasonable 8.  HIV.Biktarvy 50-2 100-25 mg daily 9.  History of tobacco as well as polysubstance abuse.  Urine drug screen positive marijuana.  Continue NicoDerm patch.  Provide counseling 10.  Hyperlipidemia.  Lipitor 11.  Diverticulosis:  -schedule bentyl q8 for spasms  -added daily milk of mag 25m  -had BM 8/29 good size  -KUB today 12.  R wrist fx s/p hardware placement- needs to have pins removed (out of place)- surgery was scheduled 9/7- will reach out to surgery to reschedule 13- Vitiligo- no issues, but skin can be more sensitive.  14. Urinary issues- continue oob to void, emptying       LOS: 5 days A FACE TO FACE EVALUATION WAS PERFORMED  KClide DeutscherRaulkar 09/26/2020, 9:41 AM

## 2020-09-26 NOTE — Progress Notes (Signed)
Physical Therapy Session Note  Patient Details  Name: Mackenzie Gonzalez MRN: [id number] Date of Birth: 05/29/1960  Today's Date: 09/26/2020 PT Individual Time: 1421-1520  PT Individual Time Calculation (min): 79 min    Short Term Goals: Week 1:  PT Short Term Goal 1 (Week 1): Pt will complete bed mobility ModI PT Short Term Goal 2 (Week 1): Pt will complete sit <> stands CGA PT Short Term Goal 3 (Week 1): Pt will ambulate 58f with CGA PT Short Term Goal 4 (Week 1): Pt will complete stairs with MinA PT Short Term Goal 5 (Week 1): PT will complete a BERG Balance  Skilled Therapeutic Interventions/Progress Updates:  Patient seated upright in w/c on entrance to room. Patient alert and agreeable to PT session. Patient denied pain during session. Session performed outside for improved temperature and environment for pt as well as challenges over uneven surfaces.   Therapeutic Activity: Transfers: Patient performed STS and SPVT transfers throughout session with close supervision. Provided verbal cues for conscious awareness of use of BLE to rise to stand and for controlled descent to sit. Pt has tendency to perform mobility quickly with reduced stability/ increased ataxia. With conscious control, performance is steady with decreased excess movements in trunk.  Gait Training:  Patient ambulated >200' x2 as well as 125' x1 all without AD and CGA/ supervision with NDT tactile cueing to ribcage for upright posture stability. Demonstrated ataxia in trunk with increased lean to R until vc/tc for improving upright stability. VC for increased bil knee flexion in swing phase and heel strike with foot advancement. Pt also guided over doorway thresholds and over change in floor coverings with good safety awareness.   Neuromuscular Re-ed: NMR facilitated during session with focus on standing balance, muscle activation, coordination. Pt guided in dynamic stepping using cones with focus on RLE tap to top of cones  while performing step over. Then progressed to LLE. Progressed to slow RLE step over cones only and then switched to LLE step over. Performed 8 sets of 6 cones each side. Standing single leg stance challenge using large hula hoop and step through toe touch. Pt with difficulty initially with attempt to perform quickly with minor LOB. Reminder to perform more slowly with attainment of balance over stance leg prior to full lift for toe touch. Hoop height adjusted throughout attempts. Improved performance with conscious practice.   Ambulation throughout outdoor environment in search and retrieval of 10 cones per bout. TC provided at ribcage for upright posture and improved postural control with decrease in R sided lateral sway.  NMR performed for improvements in motor control and coordination, balance, sequencing, judgement, and self confidence/ efficacy in performing all aspects of mobility at highest level of independence.   Patient seated upright  in w/c at end of session with brakes locked, belt alarm set, and all needs within reach. Awaiting arrival of sister for late afternoon visit.      Therapy Documentation Precautions:  Precautions Precautions: Fall Precaution Comments: Left hemi, decreased safety awareness Restrictions Weight Bearing Restrictions: No  Therapy/Group: Individual Therapy  JAlger SimonsPT, DPT 09/26/2020, 7:06 PM

## 2020-09-26 NOTE — Progress Notes (Signed)
Occupational Therapy Session Note  Patient Details  Name: Mackenzie Gonzalez MRN: [id number] Date of Birth: [birth year]  Today's Date: 09/26/2020 OT Individual Time: 0903-1002 OT Individual Time Calculation (min): 59 min    Short Term Goals: Week 1:  OT Short Term Goal 1 (Week 1): Pt will complete ambulatory toilet transfer with CGA + RW. OT Short Term Goal 2 (Week 1): Pt will completed LBD with CGA. OT Short Term Goal 3 (Week 1): Pt will demonstrate appropriate RW use with ind in 2/3 trials.  Skilled Therapeutic Interventions/Progress Updates:    Pt received semi-reclined in bed, c/o LUE pain and LPN present to administer pain rx, agreeable to therapy. Session focus on self-care retraining, activity tolerance, LUE NMR, func cognition, dynamic standing balance in prep for improved ADL/IADL/func mobility performance + decreased caregiver burden. Reviewed BLT for additional pain management. Came to sitting EOB with distant S. STS and amb > toilet with CGA and Vcs for safe RW use/LLE placement. Completed UBB + UBD with S, LBD + LBB with CGA + use of grab bar for STS. Completed 3/3 toileting tasks with CGA x2 throughout session. Amb to and from gym with CGA + RW and cues for LLE positioning/safety. Completed 2 games standing at [facility] with the following results with LUE:  Single Target: size 5 circle, 2 min , 55.06%, 1.22 RT, 98 hits Bell Cancellation: 1 min 28 secs, 2 misses   Amb transfer to w/c same manner as before.    Pt left seated in w/c with safety belt alarm engaged, call bell in reach, and all immediate needs met.    Therapy Documentation Precautions:  Precautions Precautions: Fall Precaution Comments: Left hemi, decreased safety awareness Restrictions Weight Bearing Restrictions: No  Pain: cont LUQ pain, 8/10    ADL: See Care Tool for more details.  Therapy/Group: Individual Therapy  Volanda Napoleon MS, OTR/L  09/26/2020, 6:37 AM

## 2020-09-27 DIAGNOSIS — K5731 Diverticulosis of large intestine without perforation or abscess with bleeding: Secondary | ICD-10-CM | POA: Diagnosis not present

## 2020-09-27 DIAGNOSIS — I6350 Cerebral infarction due to unspecified occlusion or stenosis of unspecified cerebral artery: Secondary | ICD-10-CM | POA: Diagnosis not present

## 2020-09-27 DIAGNOSIS — E785 Hyperlipidemia, unspecified: Secondary | ICD-10-CM

## 2020-09-27 DIAGNOSIS — I69354 Hemiplegia and hemiparesis following cerebral infarction affecting left non-dominant side: Secondary | ICD-10-CM | POA: Diagnosis not present

## 2020-09-27 NOTE — Progress Notes (Signed)
Speech Language Pathology Weekly Progress and Session Note  Patient Details  Name: Mackenzie Gonzalez MRN: [id number] Date of Birth: August 07, 1960  Beginning of progress report period: September 22, 2020 End of progress report period: September 28, 2020  Today's Date: 09/28/2020 SLP Individual Time: 3818-2993 SLP Individual Time Calculation (min): 45 min  Short Term Goals: Week 1: SLP Short Term Goal 1 (Week 1): Patient will implement safe swallow strategies during PO intake with sup A verbal cues SLP Short Term Goal 1 - Progress (Week 1): Met SLP Short Term Goal 2 (Week 1): Patient will demonstrate self monitoring and correction with speech intelligibility at sentence level with sup A verbal cues SLP Short Term Goal 2 - Progress (Week 1): Met SLP Short Term Goal 3 (Week 1): Patient will identify 2 physical/cognitive deficits and verbalize 2 safety precautions associated with new limitations at sup A level SLP Short Term Goal 3 - Progress (Week 1): Progressing toward goal SLP Short Term Goal 4 (Week 1): Patient will complete mildly complex problem solving with min A verbal cues SLP Short Term Goal 4 - Progress (Week 1): Met SLP Short Term Goal 5 (Week 1): Patient will identify and demonstrate use of at least 2 beneficial memory strategies with sup A verbal cues SLP Short Term Goal 5 - Progress (Week 1): Met SLP Short Term Goal 6 (Week 1): Patient will demonstrate selective attention to functional tasks in a mildly distracting environment with min A verbal cues to redirect SLP Short Term Goal 6 - Progress (Week 1): Met  New Short Term Goals: Week 2: SLP Short Term Goal 1 (Week 2): ST=LTG due to ELOS  Weekly Progress Updates: Patient has met 5 of 6 short term goals this reporting period. Patient has made functional progress with ST over the past week due to improved intellectual awareness, problem solving, attention, and speech intelligibility. She continues to exhibit impaired emergent awareness and  impulsivity at times which impacts her safety awareness. Patient continues to tolerate regular textures and thin liquids with sup A verbal cues for awareness of residue in left buccal cavity and to implement lingual sweep to clear. Patient/family education is ongoing. Recommend continued skilled ST intervention to maximize speech, swallow, and cognitive functions. Continue progression toward established LTGs set at mod I-to-sup A assist level overall.    Intensity: Minumum of 1-2 x/day, 30 to 90 minutes Frequency: 3 to 5 out of 7 days Duration/Length of Stay: 9/7 Treatment/Interventions: Cognitive remediation/compensation;Dysphagia/aspiration precaution training;Internal/external aids;Environmental controls;Cueing hierarchy;Medication managment;Functional tasks;Patient/family education  Daily Session  Skilled Therapeutic Interventions: Patient agreeable to skilled ST intervention with focus on cognitive goals. Patient appeared in better spirits today and requested to participate in session outside again. SLP facilitated session by providing min A fading to sup A verbal cues for mildly complex deduction puzzle. Patient demonstrated selective attention to task for 20 minute duration within a mildly distracting environment with occasional (sup A) verbal cues to redirect. Patient demonstrated mod I with speech intelligibility as evidenced by improved self monitoring and self correction at conversation level including reducing rate and over articulating to repair instances of reduced intelligibility. Speech perceived as >90% intelligible at conversation level. Patient demonstrated use of 3 compensatory memory strategies as evidenced by referring to visual schedule, her phone, and calendar to locate pertinent information. Assisted patient to bathroom at the end of session in which she required verbal cues for safety as evidenced by standing prior to locking wheelchair breaks and stating "I'm good, I got it."  Patient was left in her chair with alarm activated and immediate needs within reach at end of session. Continue per current plan of care.       General    Pain Pain Assessment Pain Scale: 0-10 Pain Score: 0-No pain  Therapy/Group: Individual Therapy  Patty Sermons 09/28/2020, 4:04 PM

## 2020-09-27 NOTE — Progress Notes (Addendum)
Speech Language Pathology Daily Session Note  Patient Details  Name: Mackenzie Gonzalez MRN: [id number] Date of Birth: 01-26-1961  Today's Date: 09/27/2020 SLP Individual Time: 1300-1400 SLP Individual Time Calculation (min): 60 min  Short Term Goals: Week 1: SLP Short Term Goal 1 (Week 1): Patient will implement safe swallow strategies during PO intake with sup A verbal cues SLP Short Term Goal 2 (Week 1): Patient will demonstrate self monitoring and correction with speech intelligibility at sentence level with sup A verbal cues SLP Short Term Goal 3 (Week 1): Patient will identify 2 physical/cognitive deficits and verbalize 2 safety precautions associated with new limitations at sup A level SLP Short Term Goal 4 (Week 1): Patient will complete mildly complex problem solving with min A verbal cues SLP Short Term Goal 5 (Week 1): Patient will identify and demonstrate use of at least 2 beneficial memory strategies with sup A verbal cues SLP Short Term Goal 6 (Week 1): Patient will demonstrate selective attention to functional tasks in a mildly distracting environment with min A verbal cues to redirect  Skilled Therapeutic Interventions: Patient agreeable to skilled ST intervention with focus on cognitive and speech goals. Upon arrival patient expressed sadness after learning her discharge date was set to next week instead of this week as she had initially hoped. Patient requested to go outside for therapy session today in which SLP was agreeable. This appeared to lift her spirits. SLP facilitated home safety and problem solving scenarios with sup-to-min A verbal cues for anticipatory problem solving skills. Patient known to respond quickly and required encouragement to slow down and intentionally take additional processing time before answering which improved effectiveness and accuracy. Facilitated progressive memory, alternating attention, and verbal reasoning task with sup A verbal cues to alternate  attention between two points (patient described an activity that begins with each letter of the alphabet). Patient displayed selective attention within a moderately distracting environment today with min A verbal cues to redirect 2/2 tendency to initiate side conversation. Facilitated recall of speech intelligibility strategies in which patient successfully recalled 3 beneficial strategies including 1. Slow down, 2. Over exaggerate mouth movements, 3. Pause between words. Patient demonstrated use of strategies at conversation level to enhance intelligibility to >90%. Returned patient to her room where she was left in her wheelchair with alarm activated and immediate needs within reach at end of session. Continue per current plan of care.      Pain Pain Assessment Pain Scale: 0-10 Pain Score: 0-No pain  Therapy/Group: Individual Therapy  Patty Sermons 09/27/2020, 3:14 PM

## 2020-09-27 NOTE — Progress Notes (Signed)
Physical Therapy Session Note  Patient Details  Name: Mackenzie Gonzalez MRN: [id number] Date of Birth: [birth year]  Today's Date: 09/27/2020 PT Individual Time: 1005-1100 PT Individual Time Calculation (min): 55 min   Short Term Goals: Week 1:  PT Short Term Goal 1 (Week 1): Pt will complete bed mobility ModI PT Short Term Goal 2 (Week 1): Pt will complete sit <> stands CGA PT Short Term Goal 3 (Week 1): Pt will ambulate 93f with CGA PT Short Term Goal 4 (Week 1): Pt will complete stairs with MinA PT Short Term Goal 5 (Week 1): PT will complete a BERG Balance   Skilled Therapeutic Interventions/Progress Updates:  Session 1   Pt received sitting in WC and agreeable to PT.   WC mobility in hall of rehab unit with supervision assist x 2092fwith cues for improved attention to the LUE.   Gait training with RW and without AD x 18033fach with CGA-supervision assist from PT for safety.  Dynamic gait training without AD. Stepping over 1-4 inch obstacles, weave through cones and stepping on/over blue airex pad. Supervision assist-CGA with one instance of min assist to prevent fall.  Aginity ladder with CGA-min assist, 1 foot in each space. In/out, and SLS for 3 sec in each space. Cues from PT for attention to the LLE and to maintain wide BOS.   Nustep x 6 minutes. With cues for attention to the LUE/LLE to improve ROM intermittently as well as decreased speed to prevent fatigue. Pt reports PRE 6/10.   All sit<>stand transfers and stand pivot transfers performed without AD and supervision assist for safety.   Patient returned to room and [facility] and all needs met.    Patient returned to room and [facility] reach and all needs met.     Session 2. Pt received supine in bed. Pt reporting fatigue and "just needing some Time." She states she will be ready for therapy tomorrow. PT encouaraged participation, but pt continues to decline.  Left in bed with call bell reach and all needs met.        Therapy Documentation Precautions:  Precautions Precautions: Fall Precaution Comments: Left hemi, decreased safety awareness Restrictions Weight Bearing Restrictions: No General: PT Amount of Missed Time (min): 30 Minutes PT Missed Treatment Reason: Patient fatigue;Patient unwilling to participate  Pain:   denies     Therapy/Group: Individual Therapy  AusLorie Phenix31/2022, 5:51 PM

## 2020-09-27 NOTE — Progress Notes (Signed)
PROGRESS NOTE   Subjective/Complaints: Patient seen laying in bed this morning.  She states she slept well overnight.  She has questions regarding abdominal films from yesterday.  Discussed with patient.  ROS: Denies CP, SOB, N/V/D  Objective:   DG Abd 1 View  Result Date: 09/26/2020 CLINICAL DATA:  Left abdominal distension EXAM: ABDOMEN - 1 VIEW COMPARISON:  None. FINDINGS: Nonobstructive bowel gas pattern. Colonic diverticulosis. Normal colonic stool burden. Visualized osseous structures are within normal limits. IMPRESSION: Negative. Electronically Signed   By: Julian Hy M.D.   On: 09/26/2020 22:52   No results for input(s): WBC, HGB, HCT, PLT in the last 72 hours.  No results for input(s): NA, K, CL, CO2, GLUCOSE, BUN, CREATININE, CALCIUM in the last 72 hours.   Intake/Output Summary (Last 24 hours) at 09/27/2020 1111 Last data filed at 09/27/2020 0814 Gross per 24 hour  Intake 800 ml  Output --  Net 800 ml        Physical Exam: Vital Signs Blood pressure 123/77, pulse 69, temperature 98 F (36.7 C), temperature source Oral, resp. rate 17, height '5\' 5"'$  (1.651 m), weight 82.7 kg, SpO2 97 %. Constitutional: No distress . Vital signs reviewed. HENT: Normocephalic.  Atraumatic. Eyes: EOMI. No discharge. Cardiovascular: No JVD.  RRR. Respiratory: Normal effort.  No stridor.  Bilateral clear to auscultation. GI: Non-distended.  BS +. Skin: Warm and dry.  Intact. Psych: Normal mood.  Normal behavior. Musc: No edema in extremities.  No tenderness in extremities, except for right wrist. Left facial weakness neuro: Alert and oriented Motor: LUE/LE: 3+/5 proximal distal  Assessment/Plan: 1. Functional deficits which require 3+ hours per day of interdisciplinary therapy in a comprehensive inpatient rehab setting. Physiatrist is providing close team supervision and 24 hour management of active medical problems  listed below. Physiatrist and rehab team continue to assess barriers to discharge/monitor patient progress toward functional and medical goals  Care Tool:  Bathing    Body parts bathed by patient: Left arm, Chest, Abdomen, Front perineal area, Right upper leg, Left upper leg, Face   Body parts bathed by helper: Right arm, Buttocks, Right lower leg, Left lower leg     Bathing assist Assist Level: Minimal Assistance - Patient > 75%     Upper Body Dressing/Undressing Upper body dressing   What is the patient wearing?: Pull over shirt    Upper body assist Assist Level: Independent    Lower Body Dressing/Undressing Lower body dressing      What is the patient wearing?: Pants     Lower body assist Assist for lower body dressing: Minimal Assistance - Patient > 75%     Toileting Toileting    Toileting assist Assist for toileting: Independent with assistive device     Transfers Chair/bed transfer  Transfers assist     Chair/bed transfer assist level: Contact Guard/Touching assist     Locomotion Ambulation   Ambulation assist      Assist level: Minimal Assistance - Patient > 75% Assistive device: No Device Max distance: 125   Walk 10 feet activity   Assist     Assist level: Contact Guard/Touching assist Assistive device: No Device  Walk 50 feet activity   Assist    Assist level: Contact Guard/Touching assist Assistive device: No Device    Walk 150 feet activity   Assist Walk 150 feet activity did not occur: Safety/medical concerns  Assist level: Contact Guard/Touching assist Assistive device: Walker-rolling    Walk 10 feet on uneven surface  activity   Assist Walk 10 feet on uneven surfaces activity did not occur: Safety/medical concerns   Assist level: Contact Guard/Touching assist Assistive device: Walker-rolling   Wheelchair     Assist Is the patient using a wheelchair?: No Type of Wheelchair: Manual    Wheelchair  assist level: Minimal Assistance - Patient > 75%      Wheelchair 50 feet with 2 turns activity    Assist        Assist Level: Minimal Assistance - Patient > 75%   Wheelchair 150 feet activity     Assist      Assist Level: Minimal Assistance - Patient > 75%   Blood pressure 123/77, pulse 69, temperature 98 F (36.7 C), temperature source Oral, resp. rate 17, height '5\' 5"'$  (1.651 m), weight 82.7 kg, SpO2 97 %.  Medical Problem List and Plan: 1.  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  Neurology plans 30-day cardiac event monitor Continue CIR 2.  Antithrombotics: -DVT/anticoagulation:  Mechanical: Sequential compression devices, below knee Bilateral lower extremities             -antiplatelet therapy: Aspirin 81 mg daily and Plavix 75 mg daily x3 weeks then aspirin alone 3. Pain Management: Tylenol as needed, Robaxin as needed   See #11  Controlled on 8/31 4. Mood/bipolar disorder: Celexa 20 mg daily, Xanax 0.5 mg 3 times daily as needed             -antipsychotic agents: Seroquel 300 mg nightly 5. Neuropsych: This patient is capable of making decisions on her own behalf. 6. Skin/Wound Care: Routine skin checks 7. Fluids/Electrolytes/Nutrition: encourage PO  Po intake reasonable 8.  HIV.Biktarvy 50-2 100-25 mg daily 9.  History of tobacco as well as polysubstance abuse.  Urine drug screen positive marijuana.  Continue NicoDerm patch.  Provide counseling 10.  Hyperlipidemia: Lipitor 11.  Diverticulosis:  -schedule bentyl q8 for spasms  -added daily milk of mag 63m  -had BM 8/29 good size  -KUB unremarkable on 8/31  States he was told she is to get a?  EGD as outpatient 12. R wrist fx s/p hardware placement- needs to have pins removed (out of place)- surgery was scheduled 9/7- will reach out to surgery to reschedule 13- Vitiligo- no issues, but skin can be more sensitive.  14. Urinary issues- continue oob to void,  emptying  Improving  LOS: 6 days A FACE TO FACE EVALUATION WAS PERFORMED  Izza Bickle ALorie Phenix8/31/2022, 11:11 AM

## 2020-09-27 NOTE — Progress Notes (Signed)
Patient ID: Mackenzie Gonzalez, female   DOB: Jul 25, 1960, 60 y.o.   MRN: [id number]  Patient referral sent to [place], [place]

## 2020-09-27 NOTE — Progress Notes (Signed)
Patient ID: Mackenzie Gonzalez, female   DOB: 11-Jan-1961, 60 y.o.   MRN: [id number] Team Conference Report to Patient/Family  Team Conference discussion was reviewed with the patient and caregiver, including goals, any changes in plan of care and target discharge date.  Patient and caregiver express understanding and are in agreement.  The patient has a target discharge date of 10/04/20.  SW met with pt at bedside, sister present on phone call. Patient making great progress. Using RW. Family education scheduled with patient sister on Saturday 9/3, 8-12.  Dyanne Iha 09/27/2020, 1:31 PM

## 2020-09-27 NOTE — Progress Notes (Signed)
Patient ID: SCHYLER SHELL, female   DOB: 12/24/1960, 60 y.o.   MRN: [id number]   Mackenzie Gonzalez, Producer, television/film/video and Bedside Commode ordered through [employer], [place]

## 2020-09-27 NOTE — Progress Notes (Signed)
Occupational Therapy Session Note  Patient Details  Name: Mackenzie Gonzalez MRN: [id number] Date of Birth: 09-02-1960  Today's Date: 09/27/2020 OT Individual Time: 8675-4492 OT Individual Time Calculation (min): 53 min    Short Term Goals: Week 1:  OT Short Term Goal 1 (Week 1): Pt will complete ambulatory toilet transfer with CGA + RW. OT Short Term Goal 2 (Week 1): Pt will completed LBD with CGA. OT Short Term Goal 3 (Week 1): Pt will demonstrate appropriate RW use with ind in 2/3 trials.  Skilled Therapeutic Interventions/Progress Updates:    Pt received semi-reclined in bed, c/o ongoing LUQ pain, but did not rate and agreeable to therapy. Session focus on self-care retraining, activity tolerance, LUE NMR, safety awareness, DME education, dynamic standing balance in prep for improved ADL/IADL/func mobility performance + decreased caregiver burden. Came to sitting EOB and donned B shoes with close S. STS and amb to toilet with CGA, cont to be moderately impulsive with transfers (not waiting for therapist cues, not putting L hand in saddle splint, etc.). CGA for toileting tasks post cont void of bladder. Discussed rec DME, will need TTB and 3in1. Interested if medicaid will cover shower equipment, will confirm with SW. Amb to and from gym with CGA and assist for safe RW as pt attempts to demonstrate "jogging" and picking up RW at times. Assessed B grip strength and administered 9HPT with the following results:  R:30, 25; average of 27/5 lbs; 30 secs L: 25 , 28 , 30 ; average of 28 lbs; 1 min 10 secs  Issued printed HEP of theraputty exercises. Pt removed and placed 10 beads with L hand, 2 instances of dropping beads. Completed 1x2 of digit flexion and practicing isolated individual digits. Copied push pin design with L hand, as well.  Completed 1 TTB transfer with CGA and Vcs for safety after visual demonstration. Discussed bathroom set-up to decrease falls risk (non slip mat, hand-held shower,  etc.)  Amb transfer back to w/c same manner as before.  Pt left seated in w/c with safety belt alarm engaged, call bell in reach, and all immediate needs met.    Therapy Documentation Precautions:  Precautions Precautions: Fall Precaution Comments: Left hemi, decreased safety awareness Restrictions Weight Bearing Restrictions: No  Pain: ongoing LUQ and R wrist pain, reviewed BLT precautions for comfort, applied ace wrap to R wrist to prevent bumping of wrist/surgery screw location   ADL: See Care Tool for more details.  Therapy/Group: Individual Therapy  Volanda Napoleon MS, OTR/L  09/27/2020, 6:41 AM

## 2020-09-27 NOTE — Patient Care Conference (Signed)
Inpatient RehabilitationTeam Conference and Plan of Care Update Date: 09/27/2020   Time: 11:16 AM    Patient Name: Mackenzie Gonzalez      Medical Record Number: [id number]  Date of Birth: 24-Aug-1960 Sex: Female         Room/Bed: 4W18C/4W18C-01 Payor Info: Payor: MEDICAID [place] / Plan: MEDICAID [place] ACCESS / Product Type: *No Product type* /    Admit Date/Time:  09/21/2020  2:09 PM  Primary Diagnosis:  Right pontine cerebrovascular accident [facility])  Hospital Problems: Principal Problem:   Right pontine cerebrovascular accident [facility])    Expected Discharge Date: Expected Discharge Date: 10/04/20  Team Members Present: Physician leading conference: Dr. Delice Lesch Social Worker Present: Erlene Quan, BSW Nurse Present: Dorien Chihuahua, RN PT Present: Barrie Folk, PT OT Present: Providence Lanius, OT SLP Present: Sherren Kerns, SLP PPS Coordinator present : Gunnar Fusi, SLP     Current Status/Progress Goal Weekly Team Focus  Bowel/Bladder             Swallow/Nutrition/ Hydration   Sup A for implementation of swallow strategies and safety, regular diet, thin liquids  Mod I  Implementation of swallow precautions with PO intake   ADL's   set-up for seated grooming, self-feeding, S for UBB, UBD, CGA for LBB, LBD, CGA to min A ambulatory toilet/shower transfer with RW + L saddle splint, LUE brunstrum level IV, hand III to IV; primarily limited by safety awareness/impulsivity with mobility at times  S for shower t/fs, showering, mod I remaining ADL  self-care/balanance/transfer retraining, pt/family/AE/DME education, LUE NMR, safety awareness   Mobility             Communication   Sup A for speech intelligibility  mod I  implementation of speech strategies at conversation level, repair of speech errors   Safety/Cognition/ Behavioral Observations  min A  mod I-to-sup A  problem solving, attention, awareness, error awareness   Pain             Skin                Discharge Planning:  Patient discharging home with sister with intermittent assist. Sister [facility] everyday except Mondays and Fridays   Team Discussion: Patient with recent abdominal spasms; KUB negative. Continent of bowel and bladder. Patient admitted post right pons stroke and very motivated to incorporate left LE into activities. Patient on target to meet rehab goals: yes, currently requires set up for self feeding and supervision for upper body dressing. Needs CGA for showering and lower body care. CGA for toileting and ambulation with a RW. Needs CGA - min assit without a RW. Goals for discharge set at supervision for toileting, mod I with ADLs and  *See Care Plan and progress notes for long and short-term goals.   Revisions to Treatment Plan:  Working on problem solving, attention, error awareness   Teaching Needs: Safety, DME use, speech intelligibility tips, medication management, secondary risk management, dietary modifications, etc.   Current Barriers to Discharge: Decreased caregiver support and Home enviroment access/layout. (Sister works from home but goes into the office on Mondays and Fridays)  Possible Resolutions to Barriers: Family education     Medical Summary Current Status: Left-sided weakness secondary to right pons and right cerebellar infarction.  Barriers to Discharge: Medical stability   Possible Resolutions to Barriers/Weekly Focus: Therapies, follow bowels/abd, counsel   Continued Need for Acute Rehabilitation Level of Care: The patient requires daily medical management by a physician with specialized training in physical  medicine and rehabilitation for the following reasons: Direction of a multidisciplinary physical rehabilitation program to maximize functional independence : Yes Medical management of patient stability for increased activity during participation in an intensive rehabilitation regime.: Yes Analysis of laboratory values and/or radiology  reports with any subsequent need for medication adjustment and/or medical intervention. : Yes   I attest that I was present, lead the team conference, and concur with the assessment and plan of the team.   Dorien Chihuahua B 09/27/2020, 1:31 PM

## 2020-09-28 DIAGNOSIS — I6350 Cerebral infarction due to unspecified occlusion or stenosis of unspecified cerebral artery: Secondary | ICD-10-CM | POA: Diagnosis not present

## 2020-09-28 LAB — GLUCOSE, CAPILLARY: Glucose-Capillary: 112 mg/dL — ABNORMAL HIGH (ref 70–99)

## 2020-09-28 NOTE — Progress Notes (Signed)
Physical Therapy Session Note  Patient Details  Name: Mackenzie Gonzalez MRN: [id number] Date of Birth: 20-May-1960  Today's Date: 09/28/2020      Short Term Goals: Week 1:  PT Short Term Goal 1 (Week 1): Pt will complete bed mobility ModI PT Short Term Goal 2 (Week 1): Pt will complete sit <> stands CGA PT Short Term Goal 3 (Week 1): Pt will ambulate 50f with CGA PT Short Term Goal 4 (Week 1): Pt will complete stairs with MinA PT Short Term Goal 5 (Week 1): PT will complete a BERG Balance   Skilled Therapeutic Interventions/Progress Updates:   Pt received supine in bed, asleep. Pt was unresponsive to verbal and tactile stimulation. Pt left supine in bed with all needs in reach.       Therapy Documentation Precautions:  Precautions Precautions: Fall Precaution Comments: Left hemi, decreased safety awareness Restrictions Weight Bearing Restrictions: No General: PT Amount of Missed Time (min): 30 Minutes PT Missed Treatment Reason: Patient fatigue Vital Signs: Therapy Vitals Temp: 98.7 F (37.1 C) Temp Source: Oral Pulse Rate: 82 Resp: 18 BP: 114/76 Patient Position (if appropriate): Sitting Oxygen Therapy SpO2: 98 % O2 Device: Room Air Pain: Pain Assessment Pain Scale: 0-10 Pain Score: 0-No pain     Therapy/Group: Individual Therapy  ALorie Phenix9/01/2020, 4:15 PM

## 2020-09-28 NOTE — Progress Notes (Signed)
PROGRESS NOTE   Subjective/Complaints: Abdominal pain persists, she is having regular BM. Discussed getting CT abdomen to assess for ulcer/diverticulitis  ROS: Denies CP, SOB, N/V/D, +abdominal pain  Objective:   DG Abd 1 View  Result Date: 09/26/2020 CLINICAL DATA:  Left abdominal distension EXAM: ABDOMEN - 1 VIEW COMPARISON:  None. FINDINGS: Nonobstructive bowel gas pattern. Colonic diverticulosis. Normal colonic stool burden. Visualized osseous structures are within normal limits. IMPRESSION: Negative. Electronically Signed   By: Julian Hy M.D.   On: 09/26/2020 22:52   No results for input(s): WBC, HGB, HCT, PLT in the last 72 hours.  No results for input(s): NA, K, CL, CO2, GLUCOSE, BUN, CREATININE, CALCIUM in the last 72 hours.  No intake or output data in the 24 hours ending 09/28/20 1330     Physical Exam: Vital Signs Blood pressure 114/76, pulse 82, temperature 98.7 F (37.1 C), temperature source Oral, resp. rate 18, height '5\' 5"'$  (1.651 m), weight 82.7 kg, SpO2 98 %. Gen: no distress, normal appearing HEENT: oral mucosa pink and moist, NCAT Cardio: Reg rate Chest: normal effort, normal rate of breathing GI: Non-distended.  BS +. Skin: Warm and dry.  Intact. Psych: Normal mood.  Normal behavior. Musc: No edema in extremities.  No tenderness in extremities, except for right wrist. Left facial weakness neuro: Alert and oriented Motor: LUE/LE: 3+/5 proximal distal  Assessment/Plan: 1. Functional deficits which require 3+ hours per day of interdisciplinary therapy in a comprehensive inpatient rehab setting. Physiatrist is providing close team supervision and 24 hour management of active medical problems listed below. Physiatrist and rehab team continue to assess barriers to discharge/monitor patient progress toward functional and medical goals  Care Tool:  Bathing    Body parts bathed by patient: Left  arm, Chest, Abdomen, Front perineal area, Right upper leg, Left upper leg, Face   Body parts bathed by helper: Right arm, Buttocks, Right lower leg, Left lower leg     Bathing assist Assist Level: Minimal Assistance - Patient > 75%     Upper Body Dressing/Undressing Upper body dressing   What is the patient wearing?: Pull over shirt    Upper body assist Assist Level: Independent    Lower Body Dressing/Undressing Lower body dressing      What is the patient wearing?: Pants     Lower body assist Assist for lower body dressing: Minimal Assistance - Patient > 75%     Toileting Toileting    Toileting assist Assist for toileting: Independent with assistive device     Transfers Chair/bed transfer  Transfers assist     Chair/bed transfer assist level: Contact Guard/Touching assist     Locomotion Ambulation   Ambulation assist      Assist level: Minimal Assistance - Patient > 75% Assistive device: No Device Max distance: 125   Walk 10 feet activity   Assist     Assist level: Contact Guard/Touching assist Assistive device: No Device   Walk 50 feet activity   Assist    Assist level: Contact Guard/Touching assist Assistive device: No Device    Walk 150 feet activity   Assist Walk 150 feet activity did not occur: Safety/medical concerns  Assist level: Contact Guard/Touching assist Assistive device: Walker-rolling    Walk 10 feet on uneven surface  activity   Assist Walk 10 feet on uneven surfaces activity did not occur: Safety/medical concerns   Assist level: Contact Guard/Touching assist Assistive device: Walker-rolling   Wheelchair     Assist Is the patient using a wheelchair?: No Type of Wheelchair: Manual    Wheelchair assist level: Minimal Assistance - Patient > 75%      Wheelchair 50 feet with 2 turns activity    Assist        Assist Level: Minimal Assistance - Patient > 75%   Wheelchair 150 feet activity      Assist      Assist Level: Minimal Assistance - Patient > 75%   Blood pressure 114/76, pulse 82, temperature 98.7 F (37.1 C), temperature source Oral, resp. rate 18, height '5\' 5"'$  (1.651 m), weight 82.7 kg, SpO2 98 %.  Medical Problem List and Plan: 1.  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  Neurology plans 30-day cardiac event monitor Continue CIR 2.  Impaired mobility: -DVT/anticoagulation:  Mechanical: Sequential compression devices, below knee Bilateral lower extremities             -antiplatelet therapy: Aspirin 81 mg daily and Plavix 75 mg daily x3 weeks then aspirin alone 3. Abdominal pain: Tylenol as needed, Robaxin as needed   See #11  Controlled on 8/31  CT abdomen ordered 4. Mood/bipolar disorder: Continue Celexa 20 mg daily, Xanax 0.5 mg 3 times daily as needed             -antipsychotic agents: Seroquel 300 mg nightly 5. Neuropsych: This patient is capable of making decisions on her own behalf. 6. Skin/Wound Care: Routine skin checks 7. Fluids/Electrolytes/Nutrition: encourage PO  Po intake reasonable 8.  HIV.Biktarvy 50-2 100-25 mg daily 9.  History of tobacco as well as polysubstance abuse.  Urine drug screen positive marijuana.  Continue NicoDerm patch.  Provide counseling 10.  Hyperlipidemia: Lipitor 11.  Diverticulosis:  -schedule bentyl q8 for spasms  -Continue daily milk of mag 70m  -had BM 8/29 good size  -KUB unremarkable on 8/31  States he was told she is to get a?  EGD as outpatient 12. R wrist fx s/p hardware placement- needs to have pins removed (out of place)- surgery was scheduled 9/7- will reach out to surgery to reschedule 13- Vitiligo- no issues, but skin can be more sensitive.  14. Urinary issues- continue oob to void, emptying  Improving  LOS: 7 days A FACE TO FACE EVALUATION WAS PERFORMED  Pape Parson P Jarad Barth 09/28/2020, 1:30 PM

## 2020-09-28 NOTE — Progress Notes (Signed)
Physical Therapy Session Note  Patient Details  Name: Mackenzie Gonzalez MRN: [id number] Date of Birth: [birth year]  Today's Date: 09/28/2020 PT Individual Time: 1006-1059 PT Individual Time Calculation (min): 53 min   Short Term Goals: Week 1:  PT Short Term Goal 1 (Week 1): Pt will complete bed mobility ModI PT Short Term Goal 2 (Week 1): Pt will complete sit <> stands CGA PT Short Term Goal 3 (Week 1): Pt will ambulate 70f with CGA PT Short Term Goal 4 (Week 1): Pt will complete stairs with MinA PT Short Term Goal 5 (Week 1): PT will complete a BERG Balance Week 2:    Week 3:     Skilled Therapeutic Interventions/Progress Updates:   Pt received supine in bed and agreeable to PT. Supine>sit transfer without assist or cues. PT assisted pt to don shoes sitting EOB with supervision assist for safety and max assist for shoe management over grip socks.   [facility] and [facility] for [facility]. WC mobility with distant supervision assist x 1550fwith only min cues for improved attention to the LUE.   [facility] while engaged in gross motor task of Wii bowling. Supervision assist-CGA from PT throughout for safety. Pt noted to have increasing trunkal ataxia throughout time in standing but no overt LOB. Intermittent semitandem stance and dynamic stepping with ball roll using the LLE; CGA intermittently as listed.   Seated forced use of LUE throughout Wii boxing 3 x 3 min rounds. Cues for improved force of activation of ROM on the LUE.   Dynamic gait training through day room while engaged in path finding task to locate 5 animals in order. Supervision assist for gait without AD with intermittent CGA due to narrow BOS causing mild LOB to the L. Pt required mod, min ands supervision asist for remembering order of animal on 3 bouts respectively.   [facility] returned to [facility] and [facility] reach and  all needs met.           Therapy Documentation Precautions:  Precautions Precautions: Fall Precaution Comments: Left hemi, decreased safety awareness Restrictions Weight Bearing Restrictions: No   Pain: Pain Assessment Pain Scale: 0-10 Pain Score: 5  Pain Location: Hand Pain Orientation: Right;Left Pain Intervention(s): Medication (See eMAR)     Therapy/Group: Individual Therapy  AuLorie Phenix/01/2020, 11:01 AM

## 2020-09-28 NOTE — Progress Notes (Signed)
Occupational Therapy Session Note  Patient Details  Name: Mackenzie Gonzalez MRN: [id number] Date of Birth: 02/04/1960  Today's Date: 09/28/2020 OT Individual Time: 0701-0754 OT Individual Time Calculation (min): 53 min    Short Term Goals: Week 1:  OT Short Term Goal 1 (Week 1): Pt will complete ambulatory toilet transfer with CGA + RW. OT Short Term Goal 2 (Week 1): Pt will completed LBD with CGA. OT Short Term Goal 3 (Week 1): Pt will demonstrate appropriate RW use with ind in 2/3 trials.  Skilled Therapeutic Interventions/Progress Updates:    Pt received semi-reclined in bed, ongoing LUQ pain, did not rate, agreeable to therapy. Session focus on self-care retraining, activity tolerance, LUE/LLE NMR, dynamic standing balance in prep for improved ADL/IADL/func mobility performance + decreased caregiver burden. Came to sitting EOB with close S and use of elevated bed features. Therapeutic use of self utilized for emotional support as pt relates disappointment delay in DC date to 1 week. STS and amb to and from toilet with CGA + min Vcs for L hand placement and safe RW use. Amb to and from day room with CGA + RW. Completed 7 min at 6/10 resistance and then 3 min at 7 min resistance. Reports 3/10 on mod RPE, edu on importance of cardiovascular exercise / maintaining healthy life style for optimal NMR recovery. Interested in trying treadmill at home, rec to work with HH/OP therapist to safely reintroduce treadmill as right now she is at an increased falls risk. Transitioned to quadruped on mat, able to complete 2 level 3 difficulty block designs with first RUE/LUE with min Vcs for accuracy. Reports difficulty maintaining quadruped and transitioned to side-lying half-way through. Amb transfer back to bed same manner as before.    Pt left seated EOB with bed alarm engaged, call bell in reach, and all immediate needs met.    Therapy Documentation Precautions:  Precautions Precautions: Fall Precaution  Comments: Left hemi, decreased safety awareness Restrictions Weight Bearing Restrictions: No  Pain: ongoing LUQ pain, did not rate   ADL: See Care Tool for more details.  Therapy/Group: Individual Therapy   A  MS, OTR/L  09/28/2020, 6:40 AM 

## 2020-09-29 DIAGNOSIS — F319 Bipolar disorder, unspecified: Secondary | ICD-10-CM

## 2020-09-29 DIAGNOSIS — I6350 Cerebral infarction due to unspecified occlusion or stenosis of unspecified cerebral artery: Secondary | ICD-10-CM | POA: Diagnosis not present

## 2020-09-29 DIAGNOSIS — I69354 Hemiplegia and hemiparesis following cerebral infarction affecting left non-dominant side: Secondary | ICD-10-CM | POA: Diagnosis not present

## 2020-09-29 DIAGNOSIS — R1032 Left lower quadrant pain: Secondary | ICD-10-CM

## 2020-09-29 DIAGNOSIS — E8809 Other disorders of plasma-protein metabolism, not elsewhere classified: Secondary | ICD-10-CM | POA: Diagnosis not present

## 2020-09-29 DIAGNOSIS — E46 Unspecified protein-calorie malnutrition: Secondary | ICD-10-CM

## 2020-09-29 MED ORDER — PROSOURCE PLUS PO LIQD
30.0000 mL | Freq: Two times a day (BID) | ORAL | Status: DC
  Administered 2020-09-29 – 2020-10-04 (×10): 30 mL via ORAL
  Filled 2020-09-29 (×9): qty 30

## 2020-09-29 NOTE — Progress Notes (Signed)
Occupational Therapy Session Note  Patient Details  Name: LAILONIE MUMPOWER MRN: [id number] Date of Birth: Jul 26, 1960  Today's Date: 09/29/2020 OT Individual Time: 1335-1400 OT Individual Time Calculation (min): 25 min    Short Term Goals: Week 2:  OT Short Term Goal 1 (Week 2): STG = LTG 2/2 ELOS  Skilled Therapeutic Interventions/Progress Updates:    Pt resting in bed upon arrival with NT present. Pt stated she would like to go home Monday but understands that 9/7 is d/c date. OT intervention with focus on toileting, functional amb with RW while attending to L. Pt understands that her safety awareness is a determining factor regarding her d/c home with sister. Pt amb with RW to bathroom with CGA. Toileting with supervision. Pt amb with RW to day room to gym before resting. After resting pt able to return to room without directions. Amb in hallway with CGA. Pt returned to bed. All needs within reach and bed alarm activated.   Therapy Documentationis Precautions:  Precautions Precautions: Fall Precaution Comments: Left hemi, decreased safety awareness Restrictions Weight Bearing Restrictions: No Pain:  Pt reports her Rt hip was painful earlier but is ok now   Therapy/Group: Individual Therapy  Leroy Libman 09/29/2020, 2:41 PM

## 2020-09-29 NOTE — Plan of Care (Signed)
  Problem: Consults Goal: RH STROKE PATIENT EDUCATION Description: See Patient Education module for education specifics  Outcome: Progressing   Problem: RH BOWEL ELIMINATION Goal: RH STG MANAGE BOWEL WITH ASSISTANCE Description: STG Manage Bowel with mod I Assistance. Outcome: Progressing Goal: RH STG MANAGE BOWEL W/MEDICATION W/ASSISTANCE Description: STG Manage Bowel with Medication with mod I Assistance. Outcome: Progressing   Problem: RH SKIN INTEGRITY Goal: RH STG SKIN FREE OF INFECTION/BREAKDOWN Description: With min assist Outcome: Progressing   Problem: RH SAFETY Goal: RH STG ADHERE TO SAFETY PRECAUTIONS W/ASSISTANCE/DEVICE Description: STG Adhere to Safety Precautions With cues/Assistance/Device. Outcome: Progressing   Problem: RH PAIN MANAGEMENT Goal: RH STG PAIN MANAGED AT OR BELOW PT'S PAIN GOAL Description: At or below level 4 Outcome: Progressing   Problem: RH KNOWLEDGE DEFICIT Goal: RH STG INCREASE KNOWLEGDE OF HYPERLIPIDEMIA Description: Patient will be able to manage HLD with medications and dietary modifications using handouts and educational resources independently Outcome: Progressing Goal: RH STG INCREASE KNOWLEDGE OF STROKE PROPHYLAXIS Description: Patient will be able to manage secondary stroke risks with medications and dietary modifications using handouts and educational resources independently Outcome: Progressing

## 2020-09-29 NOTE — Progress Notes (Signed)
PROGRESS NOTE   Subjective/Complaints: Patient seen sitting up in her chair this morning.  She states she slept well overnight.  She wants to know if she can be discharged tomorrow.  She notes family coming for education tomorrow.  ROS: Denies CP, SOB, N/V/D  Objective:   No results found. No results for input(s): WBC, HGB, HCT, PLT in the last 72 hours.  No results for input(s): NA, K, CL, CO2, GLUCOSE, BUN, CREATININE, CALCIUM in the last 72 hours.   Intake/Output Summary (Last 24 hours) at 09/29/2020 1116 Last data filed at 09/29/2020 0742 Gross per 24 hour  Intake 880 ml  Output --  Net 880 ml       Physical Exam: Vital Signs Blood pressure 121/74, pulse (!) 55, temperature 97.6 F (36.4 C), resp. rate 19, height '5\' 5"'$  (1.651 m), weight 82.7 kg, SpO2 100 %. Constitutional: No distress . Vital signs reviewed. HENT: Normocephalic.  Atraumatic. Eyes: EOMI. No discharge. Cardiovascular: No JVD.  RRR. Respiratory: Normal effort.  No stridor.  Bilateral clear to auscultation. GI: Non-distended.  BS +. Skin: Warm and dry.  Intact. Psych: Normal mood.  Normal behavior. Musc: No edema in extremities.  No tenderness in extremities, except for right wrist. Neuro: Alert Left facial weakness Motor: LUE/LE: 3+/5 proximal distal  Assessment/Plan: 1. Functional deficits which require 3+ hours per day of interdisciplinary therapy in a comprehensive inpatient rehab setting. Physiatrist is providing close team supervision and 24 hour management of active medical problems listed below. Physiatrist and rehab team continue to assess barriers to discharge/monitor patient progress toward functional and medical goals  Care Tool:  Bathing    Body parts bathed by patient: Left arm, Chest, Abdomen, Front perineal area, Right upper leg, Left upper leg, Face   Body parts bathed by helper: Right arm, Buttocks, Right lower leg, Left lower  leg     Bathing assist Assist Level: Minimal Assistance - Patient > 75%     Upper Body Dressing/Undressing Upper body dressing   What is the patient wearing?: Pull over shirt    Upper body assist Assist Level: Independent    Lower Body Dressing/Undressing Lower body dressing      What is the patient wearing?: Pants     Lower body assist Assist for lower body dressing: Minimal Assistance - Patient > 75%     Toileting Toileting    Toileting assist Assist for toileting: Independent with assistive device     Transfers Chair/bed transfer  Transfers assist     Chair/bed transfer assist level: Contact Guard/Touching assist     Locomotion Ambulation   Ambulation assist      Assist level: Minimal Assistance - Patient > 75% Assistive device: No Device Max distance: 125   Walk 10 feet activity   Assist     Assist level: Contact Guard/Touching assist Assistive device: No Device   Walk 50 feet activity   Assist    Assist level: Contact Guard/Touching assist Assistive device: No Device    Walk 150 feet activity   Assist Walk 150 feet activity did not occur: Safety/medical concerns  Assist level: Contact Guard/Touching assist Assistive device: Walker-rolling    Walk 10  feet on uneven surface  activity   Assist Walk 10 feet on uneven surfaces activity did not occur: Safety/medical concerns   Assist level: Contact Guard/Touching assist Assistive device: Walker-rolling   Wheelchair     Assist Is the patient using a wheelchair?: No Type of Wheelchair: Manual    Wheelchair assist level: Minimal Assistance - Patient > 75%      Wheelchair 50 feet with 2 turns activity    Assist        Assist Level: Minimal Assistance - Patient > 75%   Wheelchair 150 feet activity     Assist      Assist Level: Minimal Assistance - Patient > 75%   Blood pressure 121/74, pulse (!) 55, temperature 97.6 F (36.4 C), resp. rate 19, height  '5\' 5"'$  (1.651 m), weight 82.7 kg, SpO2 100 %.  Medical Problem List and Plan: 1.  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  Neurology plans 30-day cardiac event monitor Continue CIR 2.  Impaired mobility: -DVT/anticoagulation:  Mechanical: Sequential compression devices, below knee Bilateral lower extremities             -antiplatelet therapy: Aspirin 81 mg daily and Plavix 75 mg daily x3 weeks then aspirin alone 3. Abdominal pain: Tylenol as needed, Robaxin as needed   See #11  Controlled on 8/31  CT abdomen pending 4. Mood/bipolar disorder: Continue Celexa 20 mg daily, Xanax 0.5 mg 3 times daily as needed             -antipsychotic agents: Seroquel 300 mg nightly 5. Neuropsych: This patient is capable of making decisions on her own behalf. 6. Skin/Wound Care: Routine skin checks 7. Fluids/Electrolytes/Nutrition: encourage PO  Po intake reasonable 8.  HIV.Biktarvy 50-2 100-25 mg daily 9.  History of tobacco as well as polysubstance abuse.  Urine drug screen positive marijuana.  Continue NicoDerm patch.  Provide counseling 10.  Hyperlipidemia: Lipitor 11.  Diverticulosis:  -schedule bentyl q8 for spasms  -Continue daily milk of mag 24m  -had BM 8/29 good size  -KUB unremarkable on 8/31  States he was told she is to get a?  EGD as outpatient 12. R wrist fx s/p hardware placement- needs to have pins removed (out of place)- surgery was scheduled 9/7- will reach out to surgery to reschedule 13- Vitiligo- no issues, but skin can be more sensitive.  14. Urinary issues- continue oob to void, emptying  Improving 15.  Hypoalbuminemia  Supplement initiated on 9/2  LOS: 8 days A FACE TO FACE EVALUATION WAS PERFORMED  Kiren Mcisaac ALorie Phenix9/02/2020, 11:16 AM

## 2020-09-29 NOTE — Progress Notes (Signed)
Speech Language Pathology Daily Session Note  Patient Details  Name: Mackenzie Gonzalez MRN: [id number] Date of Birth: Oct 01, 1960  Today's Date: 09/29/2020 SLP Individual Time: [id number] SLP Individual Time Calculation (min): 40 min  Short Term Goals: Week 2: SLP Short Term Goal 1 (Week 2): ST=LTG due to ELOS  Skilled Therapeutic Interventions: Skilled treatment session focused on cognitive goals. Upon arrival, patient was awake and initiated sitting EOB to maximize postioning for treatment session. SLP facilitated session with a mildly complex scheduling task that focused on working memory, problem solving and attention.  Patient initially with decreased attention to task and reporting the task was "too hard" and like "taking the GED." SLP related to task to functional situation and patient agreeable. Patient reported difficulty with format of information (in paragraph form rather than a list). SLP then read each task out loud and patient demonstrated increased attention and problem solving on placement of task on schedule requiring overall Min verbal cues. Mod A verbal cues were needed for working memory while attempting to transfer task from list to her schedule. Patient left upright in bed with alarm on and all needs within reach. Continue with current plan of care.      Pain No/Denies Pain   Therapy/Group: Individual Therapy  Diangelo Radel 09/29/2020, 3:14 PM

## 2020-09-29 NOTE — Progress Notes (Signed)
Occupational Therapy Weekly Progress Note  Patient Details  Name: Mackenzie Gonzalez MRN: [id number] Date of Birth: 03/06/1960  Beginning of progress report period: September 22, 2020 End of progress report period: September 29, 2020  Today's Date: 09/29/2020 OT Individual Time: 0703-0756 OT Individual Time Calculation (min): 53 min    Patient has met 2 of 3 short term goals.  Pt has made steady progress this week in OT. Pt has demonstrated improved safety awareness, BUE coordination, dynamic standing balance, functional use of LUE to presently complete UB ADL at set-up level, LB ADL at S to CGA level. Pt cont to be primarily limited by poor safety awareness and attention to task. LUE and hand currently assessed at Brummstrom level IV and V respectively. Anticipate intermittent S and no physical assist required upon DC.   Patient continues to demonstrate the following deficits: muscle weakness, decreased cardiorespiratoy endurance, impaired timing and sequencing, abnormal tone, unbalanced muscle activation, decreased coordination, and decreased motor planning, decreased attention to left, decreased attention, decreased awareness, and decreased safety awareness, and decreased standing balance, decreased postural control, hemiplegia, and decreased balance strategies and therefore will continue to benefit from skilled OT intervention to enhance overall performance with BADL, iADL, and Reduce care partner burden.  Patient progressing toward long term goals..  Continue plan of care.  OT Short Term Goals Week 2:  OT Short Term Goal 1 (Week 2): STG = LTG 2/2 ELOS  Skilled Therapeutic Interventions/Progress Updates:    Pt received seated EOB, cont to c/o ongoing LUQ pain, did not rate, agreeable to therapy. Session focus on self-care retraining, activity tolerance, func transfers, falls risk education, LUE NMR in prep for improved ADL/IADL/func mobility performance + decreased caregiver burden. STS and amb >  shower with CGA + RW + L saddle splint, cues for safe RW use. Doffed clothing in standing with CGA and cues for safety. Bathed UB seated with S, LB with CGA for STS + with use of grab bar. Pt cont to be primarily limited by impaired attention/safety awareness, req consistent cues to reorient to task throughout. Completed full-body dressing seated EOB with close S. Amb to and from gym with CGA + RW with cues for safety in busy hospital environment. Completed 1x15 with level 3 resistive theraband with LUE: forward/cross body punches, biceps curls, horizontal shoulder abduction, B hip abduction, BLE knee extension, hamstring curl. Additionally, reviewed decreasing falls risk education at home re : flooring transions, furniture arrangement, as pt reports sister is currently rearranging home prior to her DC. Pt practiced amb on carpet in apartment and completed STS from low height couch with S, although noted to rely heavily on pushing up from RW. Amb transfer back to w/c in room same manner as before.  Pt left seated in w/c with safety belt alarm engaged, call bell in reach, and all immediate needs met.    Therapy Documentation Precautions:  Precautions Precautions: Fall Precaution Comments: Left hemi, decreased safety awareness Restrictions Weight Bearing Restrictions: No  Pain: ongoing LUQ pain , did not rate   ADL: See Care Tool for more details.   Therapy/Group: Individual Therapy   A  09/29/2020, 6:40 AM  

## 2020-09-29 NOTE — Progress Notes (Signed)
Physical Therapy Session Note  Patient Details  Name: Mackenzie Gonzalez MRN: [id number] Date of Birth: [birth year]  Today's Date: 09/29/2020 PT Individual Time: 1002-1116 PT Individual Time Calculation (min): 74 min   Short Term Goals: Week 1:  PT Short Term Goal 1 (Week 1): Pt will complete bed mobility ModI PT Short Term Goal 2 (Week 1): Pt will complete sit <> stands CGA PT Short Term Goal 3 (Week 1): Pt will ambulate 61f with CGA PT Short Term Goal 4 (Week 1): Pt will complete stairs with MinA PT Short Term Goal 5 (Week 1): PT will complete a BERG Balance Week 2:     Skilled Therapeutic Interventions/Progress Updates:  Patient seated upright in w/c on entrance to room. Patient alert and agreeable to PT session.   Patient with no pain complaint at start of session. During standing balance activity, pt starts to c/o moderate pain at R hip/ low back with eventual pinpoint of pain at mid gluteal. Pt guided in stretching outlined below for moderate pain relief.   Therapeutic Activity: Transfers: Patient performed sit<>stand and stand pivot transfers throughout session with focus on no AD use and no UE use. Pt performed throughout sesion with close supervision. Provided verbal cues for technique in conscious practice to maintain balance with reduced uncoordinated trunkal movements. .  Gait Training:  Patient ambulated 50' x4 for warmup with no AD and focus on scapular retractions, upright posture, level gaze, heel strike - all for improved quality of gait and decreased ataxic trunk movements. Demonstrated improvements with conscious practice. Progressed to ball finding activity for collection of 10 weighted balls from ground height to chest height about outdoor grounds requiring search, dynamic balance and safety judgement for safe retrieval. Good performance noted with vc throughout for maintaining upright posture and reduction of increased trunk movments and RLE heel strike.  Neuromuscular  Re-ed: NMR facilitated during session with focus on standing balance, proactice/ reactive balance, coordination, motor planning/ control, muscle activation. Pt guided in ball kicking activity for challenge to proactive and reactive standing dynamic balance. Pt's reduced activity tolerance is biggest limiter as initially pt is able to quickly move and change directions to reach ball and then becomes fatigued even after adustments to increase distance from return and decreasing speed of return. No LOB noted but a few instance of improper crossover stepping. Progressed to lateral Monster walks  with Level 1 theraband above knees. Pt performs 2 passes of 35 feet  R and L prior to need to rest with c/o low level pain from previous day's effort in therapy session. Progressed to stretching below, then NMR for ambulation under GT section. NMR performed for improvements in motor control and coordination, balance, sequencing, judgement, and self confidence/ efficacy in performing all aspects of mobility at highest level of independence.   Therapeutic Exercise: Patient guided in seated piriformis and low back stretches for pain mgmt at R "hip" at mid buttock region as well as across lower back. Stretches held for 1 minute each x4 for each LE and x4 for anterior lower back stretch. Improvement in pain noted by pt.  Patient supine  in bed at end of session with brakes locked, bed alarm set, and all needs within reach.     Therapy Documentation Precautions:  Precautions Precautions: Fall Precaution Comments: Left hemi, decreased safety awareness Restrictions Weight Bearing Restrictions: No General:   Vital Signs: Therapy Vitals Temp: 97.6 F (36.4 C) Pulse Rate: (!) 55 Resp: 19 BP: 121/74 Patient Position (if  appropriate): Lying Oxygen Therapy SpO2: 100 % O2 Device: Room Air  Therapy/Group: Individual Therapy  Alger Simons PT, DPT 09/29/2020, 7:56 AM

## 2020-09-30 DIAGNOSIS — I6350 Cerebral infarction due to unspecified occlusion or stenosis of unspecified cerebral artery: Secondary | ICD-10-CM | POA: Diagnosis not present

## 2020-09-30 NOTE — Plan of Care (Signed)
  Problem: Consults Goal: RH STROKE PATIENT EDUCATION Description: See Patient Education module for education specifics  Outcome: Progressing   Problem: RH BOWEL ELIMINATION Goal: RH STG MANAGE BOWEL WITH ASSISTANCE Description: STG Manage Bowel with mod I Assistance. Outcome: Progressing Goal: RH STG MANAGE BOWEL W/MEDICATION W/ASSISTANCE Description: STG Manage Bowel with Medication with mod I Assistance. Outcome: Progressing   Problem: RH SKIN INTEGRITY Goal: RH STG SKIN FREE OF INFECTION/BREAKDOWN Description: With min assist Outcome: Progressing   Problem: RH SAFETY Goal: RH STG ADHERE TO SAFETY PRECAUTIONS W/ASSISTANCE/DEVICE Description: STG Adhere to Safety Precautions With cues/Assistance/Device. Outcome: Progressing   Problem: RH PAIN MANAGEMENT Goal: RH STG PAIN MANAGED AT OR BELOW PT'S PAIN GOAL Description: At or below level 4 Outcome: Progressing   Problem: RH KNOWLEDGE DEFICIT Goal: RH STG INCREASE KNOWLEGDE OF HYPERLIPIDEMIA Description: Patient will be able to manage HLD with medications and dietary modifications using handouts and educational resources independently Outcome: Progressing Goal: RH STG INCREASE KNOWLEDGE OF STROKE PROPHYLAXIS Description: Patient will be able to manage secondary stroke risks with medications and dietary modifications using handouts and educational resources independently Outcome: Progressing

## 2020-09-30 NOTE — Progress Notes (Signed)
Occupational Therapy Discharge Summary  Patient Details  Name: Mackenzie Gonzalez MRN: [id number] Date of Birth: 11-22-1960   Patient has met 12 of 12 long term goals due to improved activity tolerance, improved balance, postural control, ability to compensate for deficits, functional use of  LEFT upper extremity, improved attention, improved awareness, and improved coordination.  Patient to discharge at overall Modified Independent level.  Patient's care partner is independent to provide the necessary physical and cognitive assistance at discharge.    Reasons goals not met: NA  Recommendation:  Patient will benefit from ongoing skilled OT services in home health setting to continue to advance functional skills in the area of BADL, iADL, and Reduce care partner burden.  Equipment: 3in1,  TTB  Reasons for discharge: treatment goals met and discharge from hospital  Patient/family agrees with progress made and goals achieved: Yes  OT Discharge Precautions/Restrictions  Precautions Precautions: Fall Precaution Comments: Left hemi, decreased safety awareness Restrictions Weight Bearing Restrictions: No  ADL ADL Eating: Independent Grooming: Modified independent Where Assessed-Grooming: Sitting at sink Upper Body Bathing: Supervision/safety Where Assessed-Upper Body Bathing: Shower Lower Body Bathing: Supervision/safety Where Assessed-Lower Body Bathing: Shower Upper Body Dressing: Independent Where Assessed-Upper Body Dressing: Sitting at sink, Edge of bed Lower Body Dressing: Modified independent Where Assessed-Lower Body Dressing: Standing at sink, Edge of bed Toileting: Modified independent Where Assessed-Toileting: Glass blower/designer: Close supervision Toilet Transfer Method: Counselling psychologist: Raised toilet seat, Grab bars Tub/Shower Transfer: Close supervison Clinical cytogeneticist Method: Optometrist: Marketing executive: Close supervision Social research officer, government Method: Heritage manager: Gaffer Baseline Vision/History: 1 Wears glasses (readers) Patient Visual Report: No change from baseline Vision Assessment?: No apparent visual deficits Perception  Perception: Impaired Inattention/Neglect: Does not attend to left side of body Praxis Praxis: Intact Cognition Overall Cognitive Status: Impaired/Different from baseline Arousal/Alertness: Awake/alert Orientation Level: Oriented X4 Year: 2022 Month: September Day of Week: Incorrect Attention: Selective Selective Attention: Impaired Immediate Memory Recall: Sock;Blue;Bed Memory Recall Sock: Without Cue Memory Recall Blue: Without Cue Memory Recall Bed: Without Cue Awareness: Impaired Awareness Impairment: Anticipatory impairment Problem Solving: Impaired Behaviors: Impulsive Safety/Judgment: Impaired Comments: Family endorses pt near baseline cognitively, cont to demonstrate decreased safety awareness/impulsivity with mobility Sensation Sensation Light Touch: Appears Intact Hot/Cold: Appears Intact Proprioception: Impaired by gross assessment Stereognosis: Impaired by gross assessment Additional Comments: endorses L hand "sleepiness" but able to identify LT to L digits Coordination Gross Motor Movements are Fluid and Coordinated: No Fine Motor Movements are Fluid and Coordinated: No Coordination and Movement Description: LUE>LLE hemi, greatly improved from eval Finger Nose Finger Test: Slowed on L, able to isolate index finger to complete, greatly improved fro meval Motor  Motor Motor: Hemiplegia Motor - Discharge Observations: LUE>LLE hemi Mobility  Bed Mobility Bed Mobility: Supine to Sit;Sit to Supine Supine to Sit: Independent with assistive device Sit to Supine: Independent with assistive device Transfers Sit to Stand: Independent with assistive device Stand to Sit:  Independent with assistive device  Trunk/Postural Assessment  Cervical Assessment Cervical Assessment: Within Functional Limits Thoracic Assessment Thoracic Assessment: Exceptions to [facility] (rounded shoulders) Lumbar Assessment Lumbar Assessment: Exceptions to [facility] (posterior pelvic tilt) Postural Control Postural Control: Deficits on evaluation (decreased with dynamic standing tasks with turns/distractable environment)  Balance Balance Balance Assessed: Yes Static Sitting Balance Static Sitting - Balance Support: Feet supported Static Sitting - Level of Assistance: 6: Modified independent (Device/Increase time) Dynamic Sitting Balance Dynamic Sitting - Balance Support: Feet  supported Dynamic Sitting - Level of Assistance: 6: Modified independent (Device/Increase time) Static Standing Balance Static Standing - Balance Support: During functional activity;No upper extremity supported;Bilateral upper extremity supported Static Standing - Level of Assistance: 6: Modified independent (Device/Increase time) Dynamic Standing Balance Dynamic Standing - Balance Support: During functional activity;Bilateral upper extremity supported Dynamic Standing - Level of Assistance: 5: Stand by assistance Dynamic Standing - Comments: S Extremity/Trunk Assessment RUE Assessment RUE Assessment: Within Functional Limits General Strength Comments: screw pushing skin out R ulnar wrist LUE Assessment LUE Assessment: Exceptions to [facility] Active Range of Motion (AROM) Comments: 3/4 to full shoulder flexion General Strength Comments: 4/5 in shoulder flexion LUE Body System: Neuro Brunstrum levels for arm and hand: Arm;Hand Brunstrum level for arm: Stage IV Movement is deviating from synergy Brunstrum level for hand: Stage V Independence from basic synergies   Volanda Napoleon MS, OTR/L  09/30/2020, 11:11 AM

## 2020-09-30 NOTE — Progress Notes (Signed)
Physical Therapy Weekly Progress Note  Patient Details  Name: Mackenzie Gonzalez MRN: [id number] Date of Birth: 1960/05/17  Beginning of progress report period: September 22, 2020 End of progress report period: September 29, 2020  Today's Date: 09/29/2020  Patient has met 5 of 5 short term goals.  Pt making appropriate progress towards goals and is on track to meet LTG. She has missed minimal therapy session for stroke-related fatigue but is generally receptive and eager to participate. Although she does relate a strong desire to return home soon. She completes bed mobility with supervision/ mod I, sit<>stand transfers with close supervision and  stand<>pivot transfers with CGA both with no AD or with RW for added stabiliy. She's ambulating up to 300+ft with CGA and RW but is also able to ambulate with no AD with increased vc/ tc for trunk control and upright posture. She has shown ability to navigate up to twelve 6" steps with CGA and 2 hand rails. She continues to be primarily limited by deconditioning, decreased dynamic standing balance, ataxic trunk tendencies, L sided weakness, dynamic balance, and gait impairments.    Patient continues to demonstrate the following deficits muscle weakness, decreased cardiorespiratoy endurance, impaired timing and sequencing, unbalanced muscle activation, ataxia, decreased coordination, and decreased motor planning, decreased attention to left, decreased awareness and decreased safety awareness, and decreased standing balance, decreased postural control, and decreased balance strategies and therefore will continue to benefit from skilled PT intervention to increase functional independence with mobility.  Patient progressing toward long term goals..  Continue plan of care.  PT Short Term Goals Week 1:  PT Short Term Goal 1 (Week 1): Pt will complete bed mobility ModI PT Short Term Goal 1 - Progress (Week 1): Met PT Short Term Goal 2 (Week 1): Pt will complete sit <>  stands CGA PT Short Term Goal 2 - Progress (Week 1): Met PT Short Term Goal 3 (Week 1): Pt will ambulate 60f with CGA PT Short Term Goal 3 - Progress (Week 1): Met PT Short Term Goal 4 (Week 1): Pt will complete stairs with MinA PT Short Term Goal 4 - Progress (Week 1): Met PT Short Term Goal 5 (Week 1): PT will complete a BERG Balance PT Short Term Goal 5 - Progress (Week 1): Met Week 2:  PT Short Term Goal 1 (Week 2): STG = LTG d/t ELOS  Skilled Therapeutic Interventions/Progress Updates:  Ambulation/gait training;Discharge planning;Functional mobility training;Therapeutic Activities;Psychosocial support;Visual/perceptual remediation/compensation;Balance/vestibular training;Disease management/prevention;Neuromuscular re-education;Skin care/wound management;Therapeutic Exercise;Cognitive remediation/compensation;DME/adaptive equipment instruction;Pain management;Splinting/orthotics;UE/LE Strength taining/ROM;Wheelchair propulsion/positioning;Community reintegration;Patient/family education;Functional electrical stimulation;Stair training;UE/LE Coordination activities    Therapy Documentation Precautions:  Precautions Precautions: Fall Precaution Comments: Left hemi, decreased safety awareness Restrictions Weight Bearing Restrictions: No  Pain:  Pt generally has no c/o pain.   Therapy/Group: Individual Therapy  JAlger SimonsPT, DPT 09/29/2020, 6:17 PM

## 2020-09-30 NOTE — Progress Notes (Signed)
Physical Therapy Session Note  Patient Details  Name: Mackenzie Gonzalez MRN: [id number] Date of Birth: [birth year]  Today's Date: 09/30/2020 PT Individual Time: 0906-1000 PT Individual Time Calculation (min): 54 min   Short Term Goals: Week 2:  PT Short Term Goal 1 (Week 2): STG = LTG d/t ELOS   Skilled Therapeutic Interventions/Progress Updates:   Pt received sitting in WC and agreeable to PT. WC mobility through hall x 172f with supervision assist for safety and improved attention to the LUE.   Gait training with supervision assist x 1554fand UE supported on RW. Education to pt and family on importance for attention to task and decreased speed to prevent LOB due to L inattention.   Gait training performed over ramp and mulch with RW and supervision assist; cues for AD management to lift and step on unlevel surface.   Stair management with no UE support 4x 1 and then with 1 UE support on rail. Supervision assist from PT with cues for step to gait pattern.   Car transfer training with supervision assist and RW. Cues for sit>pivot technique to reduce fall risk.    Step management to simulate access to house with RW. Cues for AD management and education for proper gaurding technique as well as step to gait pattenr for decent of step   HEP for modified [place]ago level A with hand out provided. Wii sports bowling with 1-0 UE support for safety and education to family to encourage use of RW when performing dynmaic task to reduce fall risk. .   [facility] returned to room and [facility] reach and all needs met.         Therapy Documentation Precautions:  Precautions Precautions: Fall Precaution Comments: Left hemi, decreased safety awareness Restrictions Weight Bearing Restrictions: No General:   Vital Signs:  Pain: Pain Assessment Pain Scale: 0-10 Pain Score: 0-No pain Mobility: Bed Mobility Bed Mobility: Supine to Sit;Sit to Supine Supine to Sit:  Independent with assistive device Sit to Supine: Independent with assistive device Transfers Transfers: Sit to Stand;Stand Pivot Transfers;Stand to Sit Sit to Stand: Independent with assistive device Stand to Sit: Independent with assistive device Stand Pivot Transfers: Supervision/Verbal cueing Transfer (Assistive device): Rolling walker Locomotion :    Trunk/Postural Assessment : Cervical Assessment Cervical Assessment: Within Functional Limits Thoracic Assessment Thoracic Assessment: Exceptions to [facility]) Lumbar Assessment Lumbar Assessment: Exceptions to [facility]erior pelvic tilt) Postural Control Postural Control: Deficits on evaluation (decreased with dynamic standing tasks with turns/distractable environment)  Balance: Balance Balance Assessed: Yes Static Sitting Balance Static Sitting - Balance Support: Feet supported Static Sitting - Level of Assistance: 6: Modified independent (Device/Increase time) Dynamic Sitting Balance Dynamic Sitting - Balance Support: Feet supported Dynamic Sitting - Level of Assistance: 6: Modified independent (Device/Increase time) Static Standing Balance Static Standing - Balance Support: During functional activity;No upper extremity supported;Bilateral upper extremity supported Static Standing - Level of Assistance: 6: Modified independent (Device/Increase time) Dynamic Standing Balance Dynamic Standing - Balance Support: During functional activity;Bilateral upper extremity supported Dynamic Standing - Level of Assistance: 5: Stand by assistance Dynamic Standing - Comments: S Exercises:   Other Treatments:      Therapy/Group: Individual Therapy  AuLorie Phenix/03/2020, 12:35 PM
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
## 2020-09-30 NOTE — Progress Notes (Signed)
Physical Therapy Session Note  Patient Details  Name: Mackenzie Gonzalez MRN: [id number] Date of Birth: [birth year]  Today's Date: 09/30/2020 PT Individual Time: 1700-1731 PT Individual Time Calculation (min): 31 min   Short Term Goals: Week 2:  PT Short Term Goal 1 (Week 2): STG = LTG d/t ELOS  Skilled Therapeutic Interventions/Progress Updates:    Pt received sitting in w/c and agreeable to therapy session. Pt requesting to go outside. Transported to/from outside entrance in w/c. Sit<>stands using RW with close supervision for safety during session. Gait training outside on unlevel surfaces including brick walkway, up/down inclines that have a slant, and ascending/descending outdoor stairs using L UE support on HR - pt completed all mobility tasks with supervision/intermittent CGA for safety/steadying. Pt self-selecting step-to pattern when descending stairs and step-though during ascent showing good safety awareness with this task. However, pt does demo some impaired safety awareness during ambulation via moving quickly over cracks in sidewalk, turning AD quickly and lifting it up, and stepping close to the edge of the sidewalk - cuing throughout for safety awareness. At end of session pt left seated in w/c with needs in reach, seat belt alarm on, and meal tray set-up.  Therapy Documentation Precautions:  Precautions Precautions: Fall Precaution Comments: Left hemi, decreased safety awareness Restrictions Weight Bearing Restrictions: No  Pain:  No reports or indications of pain throughout session.  Therapy/Group: Individual Therapy  Tawana Scale , PT, DPT, NCS, CSRS 09/30/2020, 5:53 PM

## 2020-09-30 NOTE — Progress Notes (Signed)
PROGRESS NOTE   Subjective/Complaints: No complaints today Requests her medications be sent to [place]  ROS: Denies CP, SOB, N/V/D  Objective:   No results found. No results for input(s): WBC, HGB, HCT, PLT in the last 72 hours.  No results for input(s): NA, K, CL, CO2, GLUCOSE, BUN, CREATININE, CALCIUM in the last 72 hours.   Intake/Output Summary (Last 24 hours) at 09/30/2020 1509 Last data filed at 09/30/2020 1256 Gross per 24 hour  Intake 598 ml  Output --  Net 598 ml       Physical Exam: Vital Signs Blood pressure 109/71, pulse 64, temperature 98.2 F (36.8 C), temperature source Oral, resp. rate 18, height '5\' 5"'$  (1.651 m), weight 82.7 kg, SpO2 98 %. Gen: no distress, normal appearing HEENT: oral mucosa pink and moist, NCAT Cardio: Reg rate Chest: normal effort, normal rate of breathing Abd: soft, non-distended Ext: no edema Psych: pleasant, normal affect Skin: Warm and dry.  Intact. Psych: Normal mood.  Normal behavior. Musc: No edema in extremities.  No tenderness in extremities, except for right wrist. Neuro: Alert Left facial weakness Motor: LUE/LE: 3+/5 proximal distal  Assessment/Plan: 1. Functional deficits which require 3+ hours per day of interdisciplinary therapy in a comprehensive inpatient rehab setting. Physiatrist is providing close team supervision and 24 hour management of active medical problems listed below. Physiatrist and rehab team continue to assess barriers to discharge/monitor patient progress toward functional and medical goals  Care Tool:  Bathing    Body parts bathed by patient: Left arm, Chest, Abdomen, Front perineal area, Right upper leg, Left upper leg, Face   Body parts bathed by helper: Right arm, Buttocks, Right lower leg, Left lower leg     Bathing assist Assist Level: Minimal Assistance - Patient > 75%     Upper Body  Dressing/Undressing Upper body dressing   What is the patient wearing?: Pull over shirt    Upper body assist Assist Level: Independent    Lower Body Dressing/Undressing Lower body dressing      What is the patient wearing?: Pants     Lower body assist Assist for lower body dressing: Minimal Assistance - Patient > 75%     Toileting Toileting    Toileting assist Assist for toileting: Supervision/Verbal cueing     Transfers Chair/bed transfer  Transfers assist     Chair/bed transfer assist level: Contact Guard/Touching assist     Locomotion Ambulation   Ambulation assist      Assist level: Minimal Assistance - Patient > 75% Assistive device: No Device Max distance: 125   Walk 10 feet activity   Assist     Assist level: Contact Guard/Touching assist Assistive device: No Device   Walk 50 feet activity   Assist    Assist level: Contact Guard/Touching assist Assistive device: No Device    Walk 150 feet activity   Assist Walk 150 feet activity did not occur: Safety/medical concerns  Assist level: Contact Guard/Touching assist Assistive device: Walker-rolling    Walk 10 feet on uneven surface  activity   Assist Walk 10 feet on uneven surfaces activity did not occur: Safety/medical concerns   Assist level: Contact  Guard/Touching assist Assistive device: Aeronautical engineer Is the patient using a wheelchair?: No Type of Wheelchair: Manual    Wheelchair assist level: Minimal Assistance - Patient > 75%      Wheelchair 50 feet with 2 turns activity    Assist        Assist Level: Minimal Assistance - Patient > 75%   Wheelchair 150 feet activity     Assist      Assist Level: Minimal Assistance - Patient > 75%   Blood pressure 109/71, pulse 64, temperature 98.2 F (36.8 C), temperature source Oral, resp. rate 18, height '5\' 5"'$  (1.651 m), weight 82.7 kg, SpO2 98 %.  Medical Problem List and  Plan: 1.  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  Neurology plans 30-day cardiac event monitor Continue CIR 2.  Impaired mobility: -DVT/anticoagulation:  Mechanical: Sequential compression devices, below knee Bilateral lower extremities             -antiplatelet therapy: Continue Aspirin 81 mg daily and Plavix 75 mg daily x3 weeks then aspirin alone 3. Abdominal pain: Tylenol as needed, Robaxin as needed   See #11  Controlled on 9/3  CT abdomen pending 4. Mood/bipolar disorder: Continue Celexa 20 mg daily, Xanax 0.5 mg 3 times daily as needed             -antipsychotic agents: Seroquel 300 mg nightly 5. Neuropsych: This patient is capable of making decisions on her own behalf. 6. Skin/Wound Care: Routine skin checks 7. Fluids/Electrolytes/Nutrition: encourage PO  Po intake reasonable 8.  HIV.Biktarvy 50-2 100-25 mg daily 9.  History of tobacco as well as polysubstance abuse.  Urine drug screen positive marijuana.  Continue NicoDerm patch.  Provide counseling 10.  Hyperlipidemia: Lipitor 11.  Diverticulosis:  -continue bentyl q8 for spasms  -Continue daily milk of mag 4m  -had BM 8/29 good size  -KUB unremarkable on 8/31  States he was told she is to get a?  EGD as outpatient 12. R wrist fx s/p hardware placement- needs to have pins removed (out of place)- surgery was scheduled 9/7- will reach out to surgery to reschedule 13- Vitiligo- no issues, but skin can be more sensitive.  14. Urinary issues- continue oob to void, emptying  Improving 15.  Hypoalbuminemia  Supplement initiated on 9/2  LOS: 9 days A FACE TO FACE EVALUATION WAS PERFORMED  KMartha ClanP Dois Juarbe 09/30/2020, 3:09 PM

## 2020-09-30 NOTE — Plan of Care (Signed)
  Problem: RH Balance Goal: LTG Patient will maintain dynamic standing with ADLs (OT) Description: LTG:  Patient will maintain dynamic standing balance with assist during activities of daily living (OT)  Flowsheets (Taken 09/30/2020 1018) LTG: Pt will maintain dynamic standing balance during ADLs with: (Goal downgraded 2/2 slower than anticipated progress.) Supervision/Verbal cueing Note: Goal downgraded 2/2 slower than anticipated progress.   Problem: RH Simple Meal Prep Goal: LTG Patient will perform simple meal prep w/assist (OT) Description: LTG: Patient will perform simple meal prep with assistance, with/without cues (OT). Flowsheets (Taken 09/30/2020 1018) LTG: Pt will perform simple meal prep with assistance level of: (Goal downgraded 2/2 slower than anticipated progress.) Supervision/Verbal cueing LTG: Pt will perform simple meal prep w/level of: Ambulate with device Note: Goal downgraded 2/2 slower than anticipated progress.   Problem: RH Toilet Transfers Goal: LTG Patient will perform toilet transfers w/assist (OT) Description: LTG: Patient will perform toilet transfers with assist, with/without cues using equipment (OT) Flowsheets (Taken 09/30/2020 1018) LTG: Pt will perform toilet transfers with assistance level of: (Goal downgraded 2/2 slower than anticipated progress.) Supervision/Verbal cueing Note: Goal downgraded 2/2 slower than anticipated progress.

## 2020-09-30 NOTE — Progress Notes (Signed)
Occupational Therapy Session Note  Patient Details  Name: Mackenzie Gonzalez MRN: [id number] Date of Birth: 1960/04/25  Today's Date: 09/30/2020 OT Individual Time: 2103-1281 OT Individual Time Calculation (min): 53 min    Short Term Goals: Week 2:  OT Short Term Goal 1 (Week 2): STG = LTG 2/2 ELOS  Skilled Therapeutic Interventions/Progress Updates:    Pt received seated EOB with family present for family education, agreeable to therapy. Session focus on self-care retraining, activity tolerance, falls recovery, dynamic standing balance, safety awareness, AD use, and family education in prep for improved ADL/IADL/func mobility performance + decreased caregiver burden. Reviewed ADL performance currently, pt should meet mod I level for dressing/grooming/toileting if AD use improves. Cont to rec S for shower transfers/showering/meal prep/IADL. Pt donned B shoes with mod I (increased time). Reviewed safe RW use and general safety for mobility at home, pt cont to demonstrate impulsivity at times and req consistent cues for safe RW use. Pt easily distracted this date with poor attention to task/following directions, reports she is excited that her family is present. Family endorses pt is cognitively at baseline. Amb to and from therapy apt with CGA to close S with RW. Pt completed 1 TTB with S and reviewed safe home set-up for kitchen/bathroom to decrease falls risk. Reviewed CVA risk factors/signs and issued stroke support group flyer with "Be Fast" acronym detailed. Additionally, reviewed RW management and transport of items; issued RW bag to facilitate ind with transporting personal items. Will cont to benefit from additional education re kitchen safety. Finally, pt completed 1 floor transfer with CGA and visual demonstration and heavy cues for safety/technique to simulate falls recovery. Sister with questions if pt safe to be home alone for 2/7 days a week as she works out of the house. Emphasized that pt must  demonstrate safe RW use and generalized safety awareness. Pt will receive 24/7 S upon first ~5 days upon DC, and is also able to stay with her step-mom if needed the two days her sister is out of the house. Amb transfers back w/c same manner as before.   Pt left seated in w/c with family present with safety belt alarm engaged, call bell in reach, and all immediate needs met.    Therapy Documentation Precautions:  Precautions Precautions: Fall Precaution Comments: Left hemi, decreased safety awareness Restrictions Weight Bearing Restrictions: No  Pain:  Ongoing LUQ pain, did not rate ADL: See Care Tool for more details.   Therapy/Group: Individual Therapy  Volanda Napoleon MS, OTR/L  09/30/2020, 6:35 AM

## 2020-10-01 NOTE — Progress Notes (Signed)
Occupational Therapy Session Note  Patient Details  Name: Mackenzie Gonzalez MRN: [id number] Date of Birth: 1960-02-07  Today's Date: 10/02/2020 OT Individual Time: [id number] OT Individual Time Calculation (min): 58 min   Skilled Therapeutic Interventions/Progress Updates:    Pt greeted in bed, declining shower and already dressed. We discussed grad day tomorrow. Pt already has a HEP for the Lt hand and also a written sheet about her medicine schedule (found inside her health resource binder). Pt reported bathroom DME needs have already been solidified by primary therapist. Education provided on light IADL activity and therapeutic activities she could perform at home to continue improving Lt hand function, emphasizing bimanual activity. Pt verbalized understanding, requested to go outside for tx. Escorted her via w/c to the outdoor patio. Pt completed multiple laps under awning  while outside (due to rain) using RW at [place] I level. Encouraged her to use her window reflection as a visual in regards to her postural alignment, pt checking reflection to achieve more upright posture. Worked on higher level balance challenges and dual task while pt ambulated over uneven concrete while conversing with therapist, pt talking fervently about politics and at times removing both hands from RW to gesture with hands. At end of session she was returned to the room via w/c. Discussed grad day expectations and her OT goals. Pt verbalized understanding. She remained sitting up in the w/c at close of session, all needs within reach and safety belt fastened.   Therapy Documentation Precautions:  Precautions Precautions: Fall Precaution Comments: Left hemi, decreased safety awareness Restrictions Weight Bearing Restrictions: No Vital Signs: Therapy Vitals Temp: 98.1 F (36.7 C) Temp Source: Oral Pulse Rate: 67 Resp: 16 BP: 98/64 Patient Position (if appropriate): Lying Oxygen Therapy SpO2: 99 % O2  Device: Room Air Pain: pt denied pain during tx   ADL: ADL Eating: Independent Grooming: Modified independent Where Assessed-Grooming: Sitting at sink Upper Body Bathing: Supervision/safety Where Assessed-Upper Body Bathing: Shower Lower Body Bathing: Supervision/safety Where Assessed-Lower Body Bathing: Shower Upper Body Dressing: Independent Where Assessed-Upper Body Dressing: Sitting at sink, Edge of bed Lower Body Dressing: Modified independent Where Assessed-Lower Body Dressing: Standing at sink, Edge of bed Toileting: Modified independent Where Assessed-Toileting: Glass blower/designer: Close supervision Armed forces technical officer Method: Counselling psychologist: Raised toilet seat, Grab bars Tub/Shower Transfer: Close supervison Clinical cytogeneticist Method: Optometrist: Facilities manager: Close supervision Social research officer, government Method: Heritage manager: Radio broadcast assistant     Therapy/Group: Individual Therapy  Trenda Corliss A Dianna Ewald 10/02/2020, 3:48 PM

## 2020-10-01 NOTE — Plan of Care (Signed)
  Problem: Consults Goal: RH STROKE PATIENT EDUCATION Description: See Patient Education module for education specifics  Outcome: Progressing   Problem: RH BOWEL ELIMINATION Goal: RH STG MANAGE BOWEL WITH ASSISTANCE Description: STG Manage Bowel with mod I Assistance. Outcome: Progressing Goal: RH STG MANAGE BOWEL W/MEDICATION W/ASSISTANCE Description: STG Manage Bowel with Medication with mod I Assistance. Outcome: Progressing   Problem: RH SKIN INTEGRITY Goal: RH STG SKIN FREE OF INFECTION/BREAKDOWN Description: With min assist Outcome: Progressing   Problem: RH SAFETY Goal: RH STG ADHERE TO SAFETY PRECAUTIONS W/ASSISTANCE/DEVICE Description: STG Adhere to Safety Precautions With cues/Assistance/Device. Outcome: Progressing   Problem: RH PAIN MANAGEMENT Goal: RH STG PAIN MANAGED AT OR BELOW PT'S PAIN GOAL Description: At or below level 4 Outcome: Progressing   Problem: RH KNOWLEDGE DEFICIT Goal: RH STG INCREASE KNOWLEGDE OF HYPERLIPIDEMIA Description: Patient will be able to manage HLD with medications and dietary modifications using handouts and educational resources independently Outcome: Progressing Goal: RH STG INCREASE KNOWLEDGE OF STROKE PROPHYLAXIS Description: Patient will be able to manage secondary stroke risks with medications and dietary modifications using handouts and educational resources independently Outcome: Progressing

## 2020-10-02 DIAGNOSIS — I6350 Cerebral infarction due to unspecified occlusion or stenosis of unspecified cerebral artery: Secondary | ICD-10-CM | POA: Diagnosis not present

## 2020-10-02 MED ORDER — SUCRALFATE 1 G PO TABS
1.0000 g | ORAL_TABLET | Freq: Three times a day (TID) | ORAL | Status: DC
  Administered 2020-10-02 – 2020-10-04 (×8): 1 g via ORAL
  Filled 2020-10-02 (×9): qty 1

## 2020-10-02 NOTE — Progress Notes (Signed)
Speech Language Pathology Daily Session Note  Patient Details  Name: Mackenzie Gonzalez MRN: [id number] Date of Birth: 1960-03-24  Today's Date: 10/02/2020 SLP Individual Time: 0905-1000 SLP Individual Time Calculation (min): 55 min  Short Term Goals: Week 2: SLP Short Term Goal 1 (Week 2): ST=LTG due to ELOS  Skilled Therapeutic Interventions: Patient agreeable to skilled ST intervention with focus on cognitive goals. SLP facilitated problem solving and safety awareness skills via home safety scenarios in which patient completed with sup A verbal cues. Patient demonstrated improved insight into current limitations and how these changes will impact her. Additionally facilitated conversation on modifications that will need to be made to ensure safety, including reducing environmental obstacles and distractions and moving at a slower pace. Patient brought up her plans to modify her diet although was unsure where to start. SLP discussed benefit of a notebook for organization and recall, and provided the suggestion of keeping a food log. Patient expressed interest and engaged in discussion on ways to format and organize information. Patient was left in her wheelchair with alarm activated and immediate needs within reach at end of session. Continue per current plan of care.      Pain Pain Assessment Pain Scale: 0-10 Pain Score: 5  Pain Type: Chronic pain;Surgical pain Pain Location: Hand Pain Orientation: Right Pain Descriptors / Indicators: Aching Pain Onset: On-going Pain Intervention(s): Medication (See eMAR);Emotional support  Therapy/Group: Individual Therapy  Nayef College T Markcus Lazenby 10/02/2020, 9:20 AM

## 2020-10-02 NOTE — Progress Notes (Addendum)
PROGRESS NOTE   Subjective/Complaints:  Mild epigastric pain, "like my ulcer pain"  some bloating associated with constipation   ROS: Denies CP, SOB, N/V/D  Objective:   No results found. No results for input(s): WBC, HGB, HCT, PLT in the last 72 hours.  No results for input(s): NA, K, CL, CO2, GLUCOSE, BUN, CREATININE, CALCIUM in the last 72 hours.   Intake/Output Summary (Last 24 hours) at 10/02/2020 0844 Last data filed at 10/01/2020 1255 Gross per 24 hour  Intake 240 ml  Output --  Net 240 ml        Physical Exam: Vital Signs Blood pressure 128/80, pulse (!) 59, temperature 98.3 F (36.8 C), resp. rate 18, height '5\' 5"'$  (1.651 m), weight 82.7 kg, SpO2 98 %.  General: No acute distress Mood and affect are appropriate Heart: Regular rate and rhythm no rubs murmurs or extra sounds Lungs: Clear to auscultation, breathing unlabored, no rales or wheezes Abdomen: Positive bowel sounds, soft nontender to palpation, nondistended Extremities: No clubbing, cyanosis, or edema Skin: No evidence of breakdown, no evidence of rash   Left facial weakness Motor: LUE/LE: 3+/5 proximal distal  Assessment/Plan: 1. Functional deficits which require 3+ hours per day of interdisciplinary therapy in a comprehensive inpatient rehab setting. Physiatrist is providing close team supervision and 24 hour management of active medical problems listed below. Physiatrist and rehab team continue to assess barriers to discharge/monitor patient progress toward functional and medical goals  Care Tool:  Bathing    Body parts bathed by patient: Left arm, Chest, Abdomen, Front perineal area, Right upper leg, Left upper leg, Face   Body parts bathed by helper: Right arm, Buttocks, Right lower leg, Left lower leg     Bathing assist Assist Level: Minimal Assistance - Patient > 75%     Upper Body Dressing/Undressing Upper body dressing   What  is the patient wearing?: Pull over shirt    Upper body assist Assist Level: Independent    Lower Body Dressing/Undressing Lower body dressing      What is the patient wearing?: Pants     Lower body assist Assist for lower body dressing: Minimal Assistance - Patient > 75%     Toileting Toileting    Toileting assist Assist for toileting: Supervision/Verbal cueing     Transfers Chair/bed transfer  Transfers assist     Chair/bed transfer assist level: Contact Guard/Touching assist Chair/bed transfer assistive device: Programmer, multimedia   Ambulation assist      Assist level: Contact Guard/Touching assist Assistive device: Walker-rolling Max distance: 187f   Walk 10 feet activity   Assist     Assist level: Contact Guard/Touching assist Assistive device: Walker-rolling   Walk 50 feet activity   Assist    Assist level: Contact Guard/Touching assist Assistive device: Walker-rolling    Walk 150 feet activity   Assist Walk 150 feet activity did not occur: Safety/medical concerns  Assist level: Contact Guard/Touching assist Assistive device: Walker-rolling    Walk 10 feet on uneven surface  activity   Assist Walk 10 feet on uneven surfaces activity did not occur: Safety/medical concerns   Assist level: Contact Guard/Touching assist Assistive device: Walker-rolling  Wheelchair     Assist Is the patient using a wheelchair?: No Type of Wheelchair: Manual    Wheelchair assist level: Minimal Assistance - Patient > 75%      Wheelchair 50 feet with 2 turns activity    Assist        Assist Level: Minimal Assistance - Patient > 75%   Wheelchair 150 feet activity     Assist      Assist Level: Minimal Assistance - Patient > 75%   Blood pressure 128/80, pulse (!) 59, temperature 98.3 F (36.8 C), resp. rate 18, height '5\' 5"'$  (1.651 m), weight 82.7 kg, SpO2 98 %.  Medical Problem List and Plan: 1.  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  Neurology plans 30-day cardiac event monitor Continue CIR, plan d/c 9/7 pt request meds to [facility], wants them to be sent by mail but too short of time frame, needs family to pick up meds for d/c 2.  Impaired mobility: -DVT/anticoagulation:  Mechanical: Sequential compression devices, below knee Bilateral lower extremities             -antiplatelet therapy: Continue Aspirin 81 mg daily and Plavix 75 mg daily x3 weeks then aspirin alone 3. Abdominal pain: epigastric , consistent with prior hx of ulcer, abd exam unremarkable , has had some constipation as well.  Will hold off on CT abd, pt plans to f/u with GI as OP , 5/22 CT abd showed diverticulosis no other findings  4. Mood/bipolar disorder: Continue Celexa 20 mg daily, Xanax 0.5 mg 3 times daily as needed             -antipsychotic agents: Seroquel 300 mg nightly 5. Neuropsych: This patient is capable of making decisions on her own behalf. 6. Skin/Wound Care: Routine skin checks 7. Fluids/Electrolytes/Nutrition: encourage PO  Po intake reasonable 8.  HIV.Biktarvy 50-2 100-25 mg daily 9.  History of tobacco as well as polysubstance abuse.  Urine drug screen positive marijuana.  Continue NicoDerm patch.  Provide counseling 10.  Hyperlipidemia: Lipitor 11.  Diverticulosis:  -continue bentyl q8 for spasms  -Continue daily milk of mag 69m  -had BM 8/29 good size  -KUB unremarkable on 8/31  States he was told she is to get a?  EGD as outpatient 12. R wrist fx s/p hardware placement- needs to have pins removed (out of place)- surgery was scheduled 9/7- pt will call Dr OAngus Palmsoffice to schedule OV, if this is non urgent surgery would be best to wait 6 mo prior to elective given recent CVA neuro will need to give pre op clearance  13- Vitiligo- no issues, but skin can be more sensitive.  14. Urinary issues- continue oob to void, emptying  Improving 15.   Hypoalbuminemia  Supplement initiated on 9/2  LOS: 11 days A FACE TO FACE EVALUATION WAS PERFORMED  ACharlett Blake9/05/2020, 8:44 AM

## 2020-10-02 NOTE — Progress Notes (Signed)
Physical Therapy Session Note  Patient Details  Name: Mackenzie Gonzalez MRN: [id number] Date of Birth: 09/14/1960  Today's Date: 10/02/2020 PT Individual Time: 1020-1100 PT Individual Time Calculation (min): 40 min   Short Term Goals: Week 1:  PT Short Term Goal 1 (Week 1): Pt will complete bed mobility ModI PT Short Term Goal 1 - Progress (Week 1): Met PT Short Term Goal 2 (Week 1): Pt will complete sit <> stands CGA PT Short Term Goal 2 - Progress (Week 1): Met PT Short Term Goal 3 (Week 1): Pt will ambulate 75ft with CGA PT Short Term Goal 3 - Progress (Week 1): Met PT Short Term Goal 4 (Week 1): Pt will complete stairs with MinA PT Short Term Goal 4 - Progress (Week 1): Met PT Short Term Goal 5 (Week 1): PT will complete a BERG Balance PT Short Term Goal 5 - Progress (Week 1): Met Week 2:  PT Short Term Goal 1 (Week 2): STG = LTG d/t ELOS Week 3:     Skilled Therapeutic Interventions/Progress Updates:   Pt received sitting in WC and agreeable to PT, once finished phone conversation with son. Pt performed WC mobility through hall x 150ft x 2 to and from day room. PT instructed pt in dynamic balance training to perform wii bowling while on ariex pad with 1 UE support on RW, supervision assist-CGA for safety with mild posterior bias intermittently. Pt instructed in Wii fit tablt tilt and penguin slide with min assist and moderate cues for improved ankle strategy over stepping strategy to correct COM.  All sit<>stand with supervision assist from WC throughout session. Patient returned to room and left sitting in WC with call bell in reach and all needs met.         Therapy Documentation Precautions:  Precautions Precautions: Fall Precaution Comments: Left hemi, decreased safety awareness Restrictions Weight Bearing Restrictions: No  Pain: denies    Therapy/Group: Individual Therapy   E  10/02/2020, 6:18 PM  

## 2020-10-02 NOTE — Discharge Summary (Signed)
Physician Discharge Summary  Patient ID: Mackenzie Gonzalez MRN: [id number] DOB/AGE: May 16, 1960 60 y.o.  Admit date: 09/21/2020 Discharge date: 10/04/2020  Discharge Diagnoses:  Principal Problem:   Right pontine cerebrovascular accident [facility]) Active Problems:   Diverticulosis of colon with hemorrhage   Dyslipidemia   Hemiparesis affecting left side as late effect of stroke (HCC)   Hypoalbuminemia due to protein-calorie malnutrition (HCC)   Bipolar disorder (HCC)   Left lower quadrant abdominal pain HIV Tobacco abuse Right wrist fracture status post hardware placement  Discharged Condition: Stable  Significant Diagnostic Studies: CT Angio Head W or Wo Contrast  Result Date: 09/17/2020 CLINICAL DATA:  Neuro deficit, acute, stroke suspected EXAM: CT ANGIOGRAPHY HEAD AND NECK TECHNIQUE: Multidetector CT imaging of the head and neck was performed using the standard protocol during bolus administration of intravenous contrast. Multiplanar CT image reconstructions and MIPs were obtained to evaluate the vascular anatomy. Carotid stenosis measurements (when applicable) are obtained utilizing NASCET criteria, using the distal internal carotid diameter as the denominator. CONTRAST:  5m OMNIPAQUE IOHEXOL 350 MG/ML SOLN COMPARISON:  None. FINDINGS: CTA NECK Aortic arch: Great vessel origins are patent. Right carotid system: Patent. Minimal calcified plaque along the proximal internal carotid. No stenosis. Left carotid system: Patent. Minimal calcified plaque along the proximal internal carotid. No stenosis. Vertebral arteries: Patent. Right vertebral artery is dominant. No stenosis. Skeleton: Mild cervical spine degenerative changes, greatest at C5-C6. Other neck: Unremarkable. Upper chest: No apical lung mass. Review of the MIP images confirms the above findings CTA HEAD Anterior circulation: Intracranial internal carotid arteries are patent. Anterior and middle cerebral arteries are patent. Posterior  circulation: Intracranial vertebral arteries are patent. Basilar artery is patent. Major cerebellar artery origins are patent. Bilateral posterior communicating arteries are present. Venous sinuses: Patent as allowed by contrast bolus timing. Review of the MIP images confirms the above findings IMPRESSION: No large vessel occlusion, hemodynamically significant stenosis, or evidence of dissection. Electronically Signed   By: PMacy MisM.D.   On: 09/17/2020 13:02   DG Abd 1 View  Result Date: 09/26/2020 CLINICAL DATA:  Left abdominal distension EXAM: ABDOMEN - 1 VIEW COMPARISON:  None. FINDINGS: Nonobstructive bowel gas pattern. Colonic diverticulosis. Normal colonic stool burden. Visualized osseous structures are within normal limits. IMPRESSION: Negative. Electronically Signed   By: SJulian HyM.D.   On: 09/26/2020 22:52   CT Head Wo Contrast  Result Date: 09/17/2020 CLINICAL DATA:  Pt reports feeling dizzy on Thursday, felt like she almost passed out. Yesterday pt also felt dizzy and then recovered. Today around 9am pt noted slur speech, left side face numb, presents with left facial droop. EXAM: CT HEAD WITHOUT CONTRAST TECHNIQUE: Contiguous axial images were obtained from the base of the skull through the vertex without intravenous contrast. COMPARISON:  06/16/2020. FINDINGS: Brain: No evidence of acute infarction, hemorrhage, hydrocephalus, extra-axial collection or mass lesion/mass effect. Patchy areas of white matter hypoattenuation are noted consistent with mild chronic microvascular ischemic change. Vascular: No hyperdense vessel or unexpected calcification. Skull: Normal. Negative for fracture or focal lesion. Sinuses/Orbits: Globes and orbits are unremarkable. Sinuses are clear. Other: None. IMPRESSION: 1. No acute intracranial abnormalities. 2. Mild chronic microvascular ischemic change. Electronically Signed   By: DLajean ManesM.D.   On: 09/17/2020 11:37   CT Angio Neck W and/or Wo  Contrast  Result Date: 09/17/2020 CLINICAL DATA:  Neuro deficit, acute, stroke suspected EXAM: CT ANGIOGRAPHY HEAD AND NECK TECHNIQUE: Multidetector CT imaging of the head and neck was performed  using the standard protocol during bolus administration of intravenous contrast. Multiplanar CT image reconstructions and MIPs were obtained to evaluate the vascular anatomy. Carotid stenosis measurements (when applicable) are obtained utilizing NASCET criteria, using the distal internal carotid diameter as the denominator. CONTRAST:  75m OMNIPAQUE IOHEXOL 350 MG/ML SOLN COMPARISON:  None. FINDINGS: CTA NECK Aortic arch: Great vessel origins are patent. Right carotid system: Patent. Minimal calcified plaque along the proximal internal carotid. No stenosis. Left carotid system: Patent. Minimal calcified plaque along the proximal internal carotid. No stenosis. Vertebral arteries: Patent. Right vertebral artery is dominant. No stenosis. Skeleton: Mild cervical spine degenerative changes, greatest at C5-C6. Other neck: Unremarkable. Upper chest: No apical lung mass. Review of the MIP images confirms the above findings CTA HEAD Anterior circulation: Intracranial internal carotid arteries are patent. Anterior and middle cerebral arteries are patent. Posterior circulation: Intracranial vertebral arteries are patent. Basilar artery is patent. Major cerebellar artery origins are patent. Bilateral posterior communicating arteries are present. Venous sinuses: Patent as allowed by contrast bolus timing. Review of the MIP images confirms the above findings IMPRESSION: No large vessel occlusion, hemodynamically significant stenosis, or evidence of dissection. Electronically Signed   By: PMacy MisM.D.   On: 09/17/2020 13:02   MR BRAIN WO CONTRAST  Result Date: 09/18/2020 CLINICAL DATA:  Stroke, follow up stroke/tia EXAM: MRI HEAD WITHOUT CONTRAST TECHNIQUE: Multiplanar, multiecho pulse sequences of the brain and surrounding  structures were obtained without intravenous contrast. COMPARISON:  CT head and CTA from 09/17/2020 FINDINGS: Brain: Acute infarct in the right paramidline pons (series 3, image 13). Multiple additional small acute infarcts in the right cerebellum. Mild associated edema without mass effect. Mild to moderate scattered additional T2 hyperintensities within the white matter, nonspecific but most likely related to chronic microvascular ischemic disease. No evidence of hydrocephalus, acute hemorrhage, mass lesion, midline shift, or extra-axial fluid collection. Basal cisterns are patent. Vascular: Major arterial flow voids are maintained at the skull base. Please see recent CTA for evaluation of vasculature. Skull and upper cervical spine: Normal marrow signal. Sinuses/Orbits: Largely clear sinuses.  No acute orbital findings. Other: No sizable mastoid effusions. IMPRESSION: 1. Acute infarcts in the right pons and right cerebellum. Mild associated edema without mass effect. 2. Mild to moderate chronic microvascular ischemic disease. Electronically Signed   By: FMargaretha SheffieldM.D.   On: 09/18/2020 08:37   DG Swallowing Func-Speech Pathology  Result Date: 09/18/2020 Table formatting from the original result was not included. Objective Swallowing Evaluation: Type of Study: MBS-Modified Barium Swallow Study  Patient Details Name: Mackenzie TREMONTIMRN: [id number] of Birth: [birth year]Today's Date: 09/18/2020 Time: SLP Start Time (ACUTE ONLY): 1422 -SLP Stop Time (ACUTE ONLY): [id number] Time Calculation (min) (ACUTE ONLY): 14 min Past Medical History: Past Medical History: Diagnosis Date  Anxiety   Bipolar 1 disorder ([place]   Chronic bronchitis ([facility]   Hepatitis C carrier ([facility]   HIV (human immunodeficiency virus infection) ([place] 2015  HIV (human immunodeficiency virus infection) ([facility]   Psychiatric disorder   reported h/o schizoaffective, bipolar, anxiety  Schizoaffective disorder ([facility]  Past Surgical History: Past  Surgical History: Procedure Laterality Date  BIOPSY  06/18/2020  Procedure: BIOPSY;  Surgeon: BThornton Park MD;  [facility] [facility]  Service: Gastroenterology;;  BREAST EXCISIONAL BIOPSY Left 1987  benign  ESOPHAGOGASTRODUODENOSCOPY (EGD) WITH PROPOFOL N/A 06/18/2020  Procedure: ESOPHAGOGASTRODUODENOSCOPY (EGD) WITH PROPOFOL;  Surgeon: BThornton Park MD;  [place] [facility]  Service: Gastroenterology;  Laterality: N/A;  I & D  EXTREMITY Right 06/16/2020  Procedure: Open debridement of skin subcutaneous tissue and bone associated with open grade 2 fracture right hand;Radiographs 3 views right hand Complex wound closure degloving injury dorsal aspect of the hand greater than 10 cm. Open reduction and internal fixation right small finger metacarpal shaft ;  Surgeon: [name], MD;  Location: [place];  Service: Orthopedics;  Laterality: Right;  ORIF WRIST FRACTURE Left 06/16/2020  Procedure: Open reduction internal fixation displaced intra-articular distal radius fracture left wrist 3 more fragments; Radiographs 3 views left wrist Left wrist brachioadialis tendon release, tendon tenotomy;  Surgeon: [name], MD;  Location: [place];  Service: Orthopedics;  Laterality: Left;  SP ARTHRO WRIST*R*    WRIST SURGERY   HPI: Pt is a 60 yo female presenting with dizziness, L sided weakness, and facial droop. MRI showed acute infarcts in the right pons and right cerebellum. PMH includes: anxiety, bipolar, chronic bronchitis, Hep C, HIV, schizoaffective disorder, smoker  Subjective: pt alert, has slurred speech Assessment / Plan / Recommendation CHL IP CLINICAL IMPRESSIONS 09/18/2020 Clinical Impression Pt presents with functional oropharyngeal swallow although with occasional delay in swallow trigger resulting in flash penetration of thin liquids above and to the vocal cords (PAS 2 & 4), but fully ejected with strength of laryngeal vestibule closure and spontaneous cough response. Penetration observed to be  deeper with consecutive sips, improved with small controlled sips via cup or straw. Oral preparation of regular solids was mildly prolonged, however suspect this is due to missing dentition. Bolus cohesion, BOT retraction and pharygneal stripping were adequate resulting in full oropharyngeal clearance of POs, minimal to no residuals noted. Recommend regular/thin liquid diet with use of small controlled sips of liquids via cup or straw. SLP to f/u for tolerance and use of basic strategies. SLP Visit Diagnosis Dysphagia, pharyngeal phase (R13.13) Attention and concentration deficit following -- Frontal lobe and executive function deficit following -- Impact on safety and function Mild aspiration risk   CHL IP TREATMENT RECOMMENDATION 09/18/2020 Treatment Recommendations Therapy as outlined in treatment plan below   Prognosis 09/18/2020 Prognosis for Safe Diet Advancement Good Barriers to Reach Goals Cognitive deficits Barriers/Prognosis Comment -- CHL IP DIET RECOMMENDATION 09/18/2020 SLP Diet Recommendations Regular solids;Thin liquid Liquid Administration via Straw;Cup Medication Administration Whole meds with liquid Compensations Slow rate;Small sips/bites Postural Changes Seated upright at 90 degrees   CHL IP OTHER RECOMMENDATIONS 09/18/2020 Recommended Consults -- Oral Care Recommendations Oral care BID Other Recommendations --   CHL IP FOLLOW UP RECOMMENDATIONS 09/18/2020 Follow up Recommendations Inpatient Rehab   CHL IP FREQUENCY AND DURATION 09/18/2020 Speech Therapy Frequency (ACUTE ONLY) min 2x/week Treatment Duration 2 weeks      CHL IP ORAL PHASE 09/18/2020 Oral Phase WFL Oral - Pudding Teaspoon -- Oral - Pudding Cup -- Oral - Honey Teaspoon -- Oral - Honey Cup -- Oral - Nectar Teaspoon -- Oral - Nectar Cup -- Oral - Nectar Straw -- Oral - Thin Teaspoon -- Oral - Thin Cup -- Oral - Thin Straw -- Oral - Puree -- Oral - Mech Soft -- Oral - Regular -- Oral - Multi-Consistency -- Oral - Pill -- Oral Phase - Comment  --  CHL IP PHARYNGEAL PHASE 09/18/2020 Pharyngeal Phase Impaired Pharyngeal- Pudding Teaspoon -- Pharyngeal -- Pharyngeal- Pudding Cup -- Pharyngeal -- Pharyngeal- Honey Teaspoon -- Pharyngeal -- Pharyngeal- Honey Cup -- Pharyngeal -- Pharyngeal- Nectar Teaspoon -- Pharyngeal -- Pharyngeal- Nectar Cup -- Pharyngeal -- Pharyngeal- Nectar Straw -- Pharyngeal -- Pharyngeal- Thin Teaspoon -- Pharyngeal --  Pharyngeal- Thin Cup Reduced airway/laryngeal closure;Penetration/Aspiration during swallow Pharyngeal Material enters airway, remains ABOVE vocal cords then ejected out Pharyngeal- Thin Straw Reduced airway/laryngeal closure;Penetration/Aspiration during swallow Pharyngeal Material enters airway, CONTACTS cords and then ejected out;Material enters airway, remains ABOVE vocal cords then ejected out Pharyngeal- Puree -- Pharyngeal -- Pharyngeal- Mechanical Soft -- Pharyngeal -- Pharyngeal- Regular -- Pharyngeal -- Pharyngeal- Multi-consistency -- Pharyngeal -- Pharyngeal- Pill -- Pharyngeal -- Pharyngeal Comment --  CHL IP CERVICAL ESOPHAGEAL PHASE 09/18/2020 Cervical Esophageal Phase WFL Pudding Teaspoon -- Pudding Cup -- Honey Teaspoon -- Honey Cup -- Nectar Teaspoon -- Nectar Cup -- Nectar Straw -- Thin Teaspoon -- Thin Cup -- Thin Straw -- Puree -- Mechanical Soft -- Regular -- Multi-consistency -- Pill -- Cervical Esophageal Comment -- Ellwood Dense, MA, CCC-SLP Acute Rehabilitation Services Office Number: [phone] Acie Fredrickson 09/18/2020, 3:45 PM              VAS [place] TRANSCRANIAL DOPPLER W BUBBLES  Result Date: 09/22/2020  Transcranial Doppler with Bubble Patient Name:  Mackenzie Gonzalez  Date of Exam:   09/18/2020 Medical Rec #: [id number]       Accession #:    [id number] Date of Birth: 03/11/1960       Patient Gender: F Patient Age:   38 years Exam Location:  [facility] Procedure:      VAS [place] TRANSCRANIAL [place] Referring Phys: Cornelius Moras XU  --------------------------------------------------------------------------------  Indications: Stroke. Comparison Study: no prior Performing Technologist: Archie Patten RVS  Examination Guidelines: A complete evaluation includes B-mode imaging, spectral Doppler, color Doppler, and power Doppler as needed of all accessible portions of each vessel. Bilateral testing is considered an integral part of a complete examination. Limited examinations for reoccurring indications may be performed as noted.  Summary: No HITS at rest or during Valsalva. Negative transcranial Doppler Bubble study with no evidence of right to left intracardiac communication.  *See table(s) above for TCD measurements and observations.  Diagnosing physician: Antony Contras MD Electronically signed by Antony Contras MD on 09/22/2020 at 3:19:19 PM.    Final    ECHOCARDIOGRAM COMPLETE BUBBLE STUDY  Result Date: 09/18/2020    ECHOCARDIOGRAM REPORT   Patient Name:   Mackenzie Gonzalez Date of Exam: 09/18/2020 Medical Rec #:  [id number]      Height:       65.0 in Accession #:    [id number]     Weight:       192.2 lb Date of Birth:  April 25, 1960      BSA:          1.945 m Patient Age:    13 years       BP:           128/94 mmHg Patient Gender: F              HR:           74 bpm. Exam Location:  Inpatient Procedure: 2D Echo, Cardiac Doppler, Color Doppler and Saline Contrast Bubble            Study Indications:    CVA  History:        Patient has no prior history of Echocardiogram examinations.                 COPD, Arrythmias:Abnormal EKG; Risk Factors:Current Smoker. Hep.                 C, HIV, Substance abuse, Schzophrenia.  Sonographer:  Dustin Flock RDCS Referring Phys: [id number] Rhetta Mura  Sonographer Comments: Technically difficult study due to poor echo windows. Image acquisition challenging due to uncooperative patient. IMPRESSIONS  1. Left ventricular ejection fraction, by estimation, is 55 to 60%. The left ventricle has normal function.  The left ventricle has no regional wall motion abnormalities. There is mild left ventricular hypertrophy. Left ventricular diastolic parameters are indeterminate.  2. Right ventricular systolic function is normal. The right ventricular size is normal. There is normal pulmonary artery systolic pressure. The estimated right ventricular systolic pressure is 0000000 mmHg.  3. The mitral valve is normal in structure. No evidence of mitral valve regurgitation. No evidence of mitral stenosis.  4. The aortic valve was not well visualized. Aortic valve regurgitation is not visualized. No aortic stenosis is present.  5. The inferior vena cava is normal in size with greater than 50% respiratory variability, suggesting right atrial pressure of 3 mmHg.  6. Agitated saline contrast bubble study was negative, with no evidence of any interatrial shunt. Technically difficult bubble study, but no shunting seen FINDINGS  Left Ventricle: Left ventricular ejection fraction, by estimation, is 55 to 60%. The left ventricle has normal function. The left ventricle has no regional wall motion abnormalities. The left ventricular internal cavity size was normal in size. There is  mild left ventricular hypertrophy. Left ventricular diastolic parameters are indeterminate. Right Ventricle: The right ventricular size is normal. No increase in right ventricular wall thickness. Right ventricular systolic function is normal. There is normal pulmonary artery systolic pressure. The tricuspid regurgitant velocity is 2.16 m/s, and  with an assumed right atrial pressure of 3 mmHg, the estimated right ventricular systolic pressure is 0000000 mmHg. Left Atrium: Left atrial size was normal in size. Right Atrium: Right atrial size was normal in size. Pericardium: There is no evidence of pericardial effusion. Presence of pericardial fat pad. Mitral Valve: The mitral valve is normal in structure. No evidence of mitral valve regurgitation. No evidence of mitral valve  stenosis. Tricuspid Valve: The tricuspid valve is normal in structure. Tricuspid valve regurgitation is trivial. Aortic Valve: The aortic valve was not well visualized. Aortic valve regurgitation is not visualized. No aortic stenosis is present. Pulmonic Valve: The pulmonic valve was not well visualized. Pulmonic valve regurgitation is not visualized. Aorta: The aortic root is normal in size and structure. Venous: The inferior vena cava is normal in size with greater than 50% respiratory variability, suggesting right atrial pressure of 3 mmHg. IAS/Shunts: The interatrial septum was not well visualized. Agitated saline contrast was given intravenously to evaluate for intracardiac shunting. Agitated saline contrast bubble study was negative, with no evidence of any interatrial shunt.  LEFT VENTRICLE PLAX 2D LVIDd:         4.70 cm  Diastology LVIDs:         3.30 cm  LV e' medial:    5.00 cm/s LV PW:         1.00 cm  LV E/e' medial:  13.8 LV IVS:        1.00 cm  LV e' lateral:   6.85 cm/s LVOT diam:     1.70 cm  LV E/e' lateral: 10.1 LV SV:         40 LV SV Index:   21 LVOT Area:     2.27 cm  RIGHT VENTRICLE RV Basal diam:  1.50 cm RV S prime:     10.20 cm/s TAPSE (M-mode): 0.9 cm LEFT ATRIUM  Index       RIGHT ATRIUM          Index LA diam:      2.90 cm 1.49 cm/m  RA Area:     7.03 cm LA Vol (A2C): 33.4 ml 17.17 ml/m RA Volume:   9.78 ml  5.03 ml/m LA Vol (A4C): 23.4 ml 12.03 ml/m  AORTIC VALVE LVOT Vmax:   90.10 cm/s LVOT Vmean:  62.300 cm/s LVOT VTI:    0.176 m  AORTA Ao Root diam: 3.00 cm MITRAL VALVE               TRICUSPID VALVE MV Area (PHT): 3.51 cm    TR Peak grad:   18.7 mmHg MV Decel Time: 216 msec    TR Vmax:        216.00 cm/s MV E velocity: 69.00 cm/s MV A velocity: 72.80 cm/s  SHUNTS MV E/A ratio:  0.95        Systemic VTI:  0.18 m                            Systemic Diam: 1.70 cm Oswaldo Milian MD Electronically signed by Oswaldo Milian MD Signature Date/Time:  09/18/2020/4:35:29 PM    Final     Labs:  Basic Metabolic Panel: No results for input(s): NA, K, CL, CO2, GLUCOSE, BUN, CREATININE, CALCIUM, MG, PHOS in the last 168 hours.  CBC: No results for input(s): WBC, NEUTROABS, HGB, HCT, MCV, PLT in the last 168 hours.  CBG: Recent Labs  Lab 09/28/20 1109  GLUCAP 112*   Family history.  Mother with psychiatric illness.  Follow hepatitis.  Paternal aunt with breast cancer.  Negative for colon cancer or stomach cancer  Brief HPI:   Mackenzie Gonzalez is a 60 y.o. right-handed female with history significant for HIV 2015 hepatitis C chronic bronchitis IBS chronic tobacco polysubstance abuse anxiety/bipolar disorder.  Per chart review lives with sister.  Ambulated independently prior to admission.  Presented 09/17/2020 with acute onset of dizziness left facial droop slurred speech and left-sided weakness.  Cranial CT scan negative for acute changes.  CT angiogram head and neck no large vessel occlusion or significant stenosis.  Patient did not receive tPA.  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  Mild associated edema without mass-effect.  Admission chemistries unremarkable urine drug screen positive benzos as well as marijuana.  Most recent CD4 count June 22 noted to be 869 on most recent HIV RNA viral load to be 28 on November 2021.  Echocardiogram with ejection fraction of 55 to 60% no wall motion abnormalities.  Currently maintained on aspirin and Plavix for CVA prophylaxis x3 weeks and aspirin alone.  Plan for 30-day cardiac event monitor.  Patient was initially on Genvoya for HIV which impaired conversion of Plavix to active form thus discontinued started on Biktarvy.  Tolerating a regular diet.  Therapy evaluations completed due to patient's left-sided weakness and dysarthria was admitted for a comprehensive rehab program.   Hospital Course: Mackenzie Gonzalez was admitted to rehab 09/21/2020 for inpatient therapies to consist of PT, ST  and OT at least three hours five days a week. Past admission physiatrist, therapy team and rehab RN have worked together to provide customized collaborative inpatient rehab.  Pertaining to patient's right pons right cerebellar infarction remained stable remained on aspirin and Plavix x3 weeks and aspirin alone.  SCDs for DVT prophylaxis.  He did  have some abdominal discomfort consistent with prior history of ulcer CT abdomen 5/22 showed diverticulosis no other findings.  He was started on Bentyl every 8 hours for spasms bowel movements well regulated KUB 8/31 unremarkable.  Mood with bipolar disorder Celexa Xanax as advised as well as Seroquel at night he was attending therapies.  HIV ongoing transition made to [facility].  Right wrist fracture status post hardware placement needs to have pins removed surgery was scheduled 9/7 with Dr. Apolonio Schneiders would follow-up outpatient.  Patient with history of tobacco polysubstance use receiving counsel guards to cessation of illicit drug products alcohol and tobacco.   Blood pressures were monitored on TID basis and controlled     Rehab course: During patient's stay in rehab weekly team conferences were held to monitor patient's progress, set goals and discuss barriers to discharge. At admission, patient required minimal assist 60 feet rolling walker moderate assist stand pivot transfers moderate assist upper body bathing moderate assist lower body bathing  Physical exam.  Blood pressure 134/90 pulse 62 temperature 98.3 respirations 18 oxygen saturation 98% room air Constitutional.  No acute distress HEENT Head.  Normocephalic and atraumatic Eyes.  Pupils round and reactive to light no discharge without nystagmus Neck.  Supple nontender no JVD without thyromegaly Cardiac regular rate and rhythm not extra sounds or murmur heard Abdomen.  Soft nontender positive bowel sounds without rebound Respiratory effort normal no respiratory distress without  wheeze Musculoskeletal.  Normal range of motion. Comments.  Right upper extremity 4+/5 in biceps triceps wrist extension grip and finger abduction Left upper extremity 3+/5 in same muscles Right lower extremity 5 -/5 in hip flexors knee extension dorsi plantarflexion Left lower extremity 4 -/5 in hip flexors otherwise 4/5 Skin.  Vitiligo noted in all 4 extremities Neurologic.  Alert dysarthric but intelligible follows commands provides name and age  He/She  has had improvement in activity tolerance, balance, postural control as well as ability to compensate for deficits. He/She has had improvement in functional use RUE/LUE  and RLE/LLE as well as improvement in awareness.  Sit to stand rolling walker close supervision.  Gait training outside on unlevel surfaces including brick walkway up and down inclines ascending and descending outdoor stairs completed all mobility tasks with supervision.  ADLs sessions focused on self-care activity tolerance dynamic standing balance.  Modified independent level for mobility dressing grooming toileting.  Patient was able to fully communicate his needs.  Full family teaching completed plan discharged to home       Disposition: Discharged home    Diet: Regular  Special Instructions: No driving smoking or alcohol  Medications at discharge 1.  Tylenol as needed 2.  Xanax 0.5 mg p.o. 3 times daily as needed 3.  Aspirin 81 mg p.o. daily 4.  Lipitor 80 mg p.o. daily 5.Biktarvy 50-200-25 mg tab daily 6.  Celexa 20 mg p.o. daily 7.  Plavix 75 mg p.o. daily x7 days and stop 8.  Bentyl 10 mg p.o. 3 times daily before meals 9.  Linzess 145 mcg p.o. daily 10.  Robaxin 500 mg every 6 as needed muscle spasms 11.  NicoDerm patch taper as directed 12.  Protonix 40 mg p.o. daily 13.  Metamucil 1 packet daily 14.  Seroquel 300 mg p.o. nightly 15.  Senokot S1 tablet p.o. twice daily 16.  Carafate 1 g p.o. 3 times daily with meals and bedtime 17.  Vitamin B12  1000 mcg p.o. daily  30-35 minutes were spent completing discharge summary and discharge planning  Discharge Instructions     Ambulatory referral to Neurology   Complete by: As directed    An appointment is requested in approximately: 4 weeks right pontine/right cerebellar infarction   Ambulatory referral to Physical Medicine Rehab   Complete by: As directed    Moderate complexity follow-up 1 to 2 weeks right pons/right cerebellar CVA        Follow-up Information     Kirsteins, Luanna Salk, MD Follow up.   Specialty: Physical Medicine and Rehabilitation Why: Office to call for appointment Contact information: [place] [address] [phone]         Campbell Riches, MD Follow up.   Specialty: Infectious Diseases Why: Call for appointment Contact information: [address] [place] [address] [phone]         [place] [facility] Follow up.   Specialty: Cardiology Why: our office will call with follow up date in 5-6 weeks to review monitor Contact information: [address] [place]        [name], MD Follow up.   Specialty: Orthopedic Surgery Why: Call for appointment Contact information: [address] [place] [address] [id number]                 Signed: Cathlyn Parsons 10/04/2020, 5:12 AM

## 2020-10-03 ENCOUNTER — Other Ambulatory Visit (HOSPITAL_COMMUNITY): Payer: Self-pay

## 2020-10-03 DIAGNOSIS — I6350 Cerebral infarction due to unspecified occlusion or stenosis of unspecified cerebral artery: Secondary | ICD-10-CM | POA: Diagnosis not present

## 2020-10-03 MED ORDER — ASPIRIN 81 MG PO CHEW: 81.0000 mg | CHEWABLE_TABLET | Freq: Every day | ORAL | Status: AC

## 2020-10-03 MED ORDER — ALPRAZOLAM 0.5 MG PO TABS
0.5000 mg | ORAL_TABLET | Freq: Three times a day (TID) | ORAL | 0 refills | Status: DC | PRN
Start: 2020-10-03 — End: 2020-11-02
  Filled 2020-10-03 – 2020-10-04 (×2): qty 30, 10d supply, fill #0

## 2020-10-03 MED ORDER — QUETIAPINE FUMARATE 300 MG PO TABS
300.0000 mg | ORAL_TABLET | Freq: Every day | ORAL | 0 refills | Status: DC
Start: 2020-10-03 — End: 2020-10-10
  Filled 2020-10-03: qty 30, 30d supply, fill #0

## 2020-10-03 MED ORDER — POLYETHYLENE GLYCOL 3350 17 G PO PACK
17.0000 g | PACK | Freq: Two times a day (BID) | ORAL | 0 refills | Status: DC | PRN
Start: 2020-10-03 — End: 2022-09-19

## 2020-10-03 MED ORDER — DICYCLOMINE HCL 10 MG PO CAPS
10.0000 mg | ORAL_CAPSULE | Freq: Three times a day (TID) | ORAL | 0 refills | Status: DC
  Filled 2020-10-03: qty 90, 30d supply, fill #0

## 2020-10-03 MED ORDER — PANTOPRAZOLE SODIUM 40 MG PO TBEC
40.0000 mg | DELAYED_RELEASE_TABLET | Freq: Every day | ORAL | 0 refills | Status: DC
Start: 2020-10-03 — End: 2020-11-07
  Filled 2020-10-03: qty 30, 30d supply, fill #0

## 2020-10-03 MED ORDER — BICTEGRAVIR-EMTRICITAB-TENOFOV 50-200-25 MG PO TABS
1.0000 | ORAL_TABLET | Freq: Every day | ORAL | 0 refills | Status: DC
Start: 2020-10-03 — End: 2020-10-25
  Filled 2020-10-03 – 2020-10-04 (×2): qty 30, 30d supply, fill #0

## 2020-10-03 MED ORDER — CITALOPRAM HYDROBROMIDE 20 MG PO TABS
20.0000 mg | ORAL_TABLET | Freq: Every day | ORAL | 0 refills | Status: DC
  Filled 2020-10-03: qty 30, 30d supply, fill #0

## 2020-10-03 MED ORDER — SUCRALFATE 1 G PO TABS
1.0000 g | ORAL_TABLET | Freq: Three times a day (TID) | ORAL | 0 refills | Status: DC
  Filled 2020-10-03 – 2020-10-04 (×2): qty 90, 23d supply, fill #0

## 2020-10-03 MED ORDER — CLOPIDOGREL BISULFATE 75 MG PO TABS
75.0000 mg | ORAL_TABLET | Freq: Every day | ORAL | 0 refills | Status: DC
  Filled 2020-10-03 – 2020-10-04 (×3): qty 7, 7d supply, fill #0

## 2020-10-03 MED ORDER — LINACLOTIDE 145 MCG PO CAPS
145.0000 ug | ORAL_CAPSULE | Freq: Every day | ORAL | 0 refills | Status: DC
  Filled 2020-10-03: qty 30, 30d supply, fill #0

## 2020-10-03 MED ORDER — ALBUTEROL SULFATE HFA 108 (90 BASE) MCG/ACT IN AERS
2.0000 | INHALATION_SPRAY | Freq: Four times a day (QID) | RESPIRATORY_TRACT | 6 refills | Status: DC | PRN
  Filled 2020-10-03: qty 8.5, 25d supply, fill #0
  Filled 2020-10-26: qty 8.5, 25d supply, fill #1
  Filled 2020-12-12: qty 8.5, 25d supply, fill #2
  Filled 2020-12-14: qty 18, 25d supply, fill #2
  Filled 2021-01-23: qty 18, 25d supply, fill #3

## 2020-10-03 MED ORDER — PSYLLIUM 95 % PO PACK
1.0000 | PACK | Freq: Every day | ORAL | 0 refills | Status: DC
  Filled 2020-10-03: qty 30, 30d supply, fill #0
  Filled 2020-10-26: qty 240, 240d supply, fill #0

## 2020-10-03 MED ORDER — ATORVASTATIN CALCIUM 80 MG PO TABS
80.0000 mg | ORAL_TABLET | Freq: Every day | ORAL | 0 refills | Status: DC
Start: 2020-10-03 — End: 2020-11-07
  Filled 2020-10-03 – 2020-10-04 (×3): qty 30, 30d supply, fill #0

## 2020-10-03 MED ORDER — METHOCARBAMOL 500 MG PO TABS
500.0000 mg | ORAL_TABLET | Freq: Four times a day (QID) | ORAL | 0 refills | Status: DC | PRN
  Filled 2020-10-03: qty 30, 8d supply, fill #0

## 2020-10-03 MED ORDER — SENNOSIDES-DOCUSATE SODIUM 8.6-50 MG PO TABS: 1.0000 | ORAL_TABLET | Freq: Two times a day (BID) | ORAL | Status: DC

## 2020-10-03 MED ORDER — CYANOCOBALAMIN 1000 MCG PO TABS
1000.0000 ug | ORAL_TABLET | Freq: Every day | ORAL | 0 refills | Status: DC
  Filled 2020-10-03 – 2020-10-26 (×2): qty 30, 30d supply, fill #0

## 2020-10-03 NOTE — Progress Notes (Signed)
PROGRESS NOTE   Subjective/Complaints:  No abd pain, having regular continent BM In good spirits , listening to music , happy about her improvements Discussed smoking cessation   ROS: Denies CP, SOB, N/V/D  Objective:   No results found. No results for input(s): WBC, HGB, HCT, PLT in the last 72 hours.  No results for input(s): NA, K, CL, CO2, GLUCOSE, BUN, CREATININE, CALCIUM in the last 72 hours.   Intake/Output Summary (Last 24 hours) at 10/03/2020 0754 Last data filed at 10/02/2020 1700 Gross per 24 hour  Intake 420 ml  Output --  Net 420 ml        Physical Exam: Vital Signs Blood pressure 96/61, pulse (!) 55, temperature 98.2 F (36.8 C), resp. rate 16, height '5\' 5"'$  (1.651 m), weight 82.7 kg, SpO2 97 %.  General: No acute distress Mood and affect are appropriate Heart: Regular rate and rhythm no rubs murmurs or extra sounds Lungs: Clear to auscultation, breathing unlabored, no rales or wheezes Abdomen: Positive bowel sounds, soft nontender to palpation, nondistended Extremities: No clubbing, cyanosis, or edema Skin: No evidence of breakdown, no evidence of rash  Left facial weakness Motor: LUE/LE: 3+/5 proximal distal, mild dysmetria LUE   Assessment/Plan: 1. Functional deficits which require 3+ hours per day of interdisciplinary therapy in a comprehensive inpatient rehab setting. Physiatrist is providing close team supervision and 24 hour management of active medical problems listed below. Physiatrist and rehab team continue to assess barriers to discharge/monitor patient progress toward functional and medical goals  Care Tool:  Bathing    Body parts bathed by patient: Left arm, Chest, Abdomen, Front perineal area, Right upper leg, Left upper leg, Face   Body parts bathed by helper: Right arm, Buttocks, Right lower leg, Left lower leg     Bathing assist Assist Level: Minimal Assistance - Patient >  75%     Upper Body Dressing/Undressing Upper body dressing   What is the patient wearing?: Pull over shirt    Upper body assist Assist Level: Independent    Lower Body Dressing/Undressing Lower body dressing      What is the patient wearing?: Pants     Lower body assist Assist for lower body dressing: Minimal Assistance - Patient > 75%     Toileting Toileting    Toileting assist Assist for toileting: Supervision/Verbal cueing     Transfers Chair/bed transfer  Transfers assist     Chair/bed transfer assist level: Contact Guard/Touching assist Chair/bed transfer assistive device: Programmer, multimedia   Ambulation assist      Assist level: Contact Guard/Touching assist Assistive device: Walker-rolling Max distance: 151f   Walk 10 feet activity   Assist     Assist level: Contact Guard/Touching assist Assistive device: Walker-rolling   Walk 50 feet activity   Assist    Assist level: Contact Guard/Touching assist Assistive device: Walker-rolling    Walk 150 feet activity   Assist Walk 150 feet activity did not occur: Safety/medical concerns  Assist level: Contact Guard/Touching assist Assistive device: Walker-rolling    Walk 10 feet on uneven surface  activity   Assist Walk 10 feet on uneven surfaces activity did not occur:  Safety/medical concerns   Assist level: Contact Guard/Touching assist Assistive device: Walker-rolling   Wheelchair     Assist Is the patient using a wheelchair?: No Type of Wheelchair: Manual    Wheelchair assist level: Minimal Assistance - Patient > 75%      Wheelchair 50 feet with 2 turns activity    Assist        Assist Level: Minimal Assistance - Patient > 75%   Wheelchair 150 feet activity     Assist      Assist Level: Minimal Assistance - Patient > 75%   Blood pressure 96/61, pulse (!) 55, temperature 98.2 F (36.8 C), resp. rate 16, height '5\' 5"'$  (1.651 m), weight  82.7 kg, SpO2 97 %.  Medical Problem List and Plan: 1.  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  Neurology plans 30-day cardiac event monitor Continue CIR, plan d/c 9/7 pt request meds to [facility], wants them to be sent by mail but too short of time frame, needs family to pick up meds for d/c 2.  Impaired mobility: -DVT/anticoagulation:  Mechanical: Sequential compression devices, below knee Bilateral lower extremities             -antiplatelet therapy: Continue Aspirin 81 mg daily and Plavix 75 mg daily x3 weeks then aspirin alone 3. Abdominal pain: epigastric , consistent with prior hx of ulcer, abd exam unremarkable , has had some constipation as well.  Will hold off on CT abd, pt plans to f/u with GI as OP , 5/22 CT abd showed diverticulosis no other findings  4. Mood/bipolar disorder: Continue Celexa 20 mg daily, Xanax 0.5 mg 3 times daily as needed             -antipsychotic agents: Seroquel 300 mg nightly 5. Neuropsych: This patient is capable of making decisions on her own behalf. 6. Skin/Wound Care: Routine skin checks 7. Fluids/Electrolytes/Nutrition: encourage PO  Po intake reasonable 8.  HIV.Biktarvy 50-2 100-25 mg daily 9.  History of tobacco as well as polysubstance abuse.  Urine drug screen positive marijuana.  Continue NicoDerm patch.  Provide counseling 10.  Hyperlipidemia: Lipitor 11.  Diverticulosis:  -continue bentyl q8 for spasms  -Continue daily milk of mag 13m  -had BM 8/29 good size  -KUB unremarkable on 8/31  States he was told she is to get a EGD +/- Lower endoscopy as outpatient 12. R wrist fx s/p hardware placement- needs to have pins removed (out of place)- surgery was scheduled 9/7- pt will call Dr OAngus Palmsoffice to schedule OV, if this is non urgent surgery would be best to wait 6 mo prior to elective given recent CVA neuro will need to give pre op clearance  13- Vitiligo- no issues, but skin can be more  sensitive.  14. Urinary issues- continue oob to void, emptying  Improving 15.  Hypoalbuminemia  Supplement initiated on 9/2  LOS: 12 days A FACE TO FClaytonE Leor Whyte 10/03/2020, 7:54 AM

## 2020-10-03 NOTE — Progress Notes (Signed)
Speech Language Pathology Discharge Summary  Patient Details  Name: Mackenzie Gonzalez MRN: [id number] Date of Birth: 16-Apr-1960  Today's Date: 10/03/2020 SLP Individual Time: 1400-1445 SLP Individual Time Calculation (min): 45 min   Skilled Therapeutic Interventions: Patient agreeable to skilled ST intervention with focus on cognitive goals. SLP facilitated session by providing additional stroke education per patient request. SLP educated on acronym "Be Fast." Patient successfully verbalized acronym with details with sup A verbal cues for recall. Facilitated problem solving safety scenarios in which patient completed with sup A verbal cues. Patient elaborated on the unsafe situations she has experienced in the past and current safety measures she wishes to take moving forward to maximize her safety and reduce risk for future hospitalizations. Patient was left in wheelchair with alarm activated and immediate needs within reach at end of session. Continue per current plan of care.    Patient has met 4 of 5 long term goals.  Patient to discharge at overall Supervision level.  Reasons goals not met: Pt currently min A for mildly complex problem solving   Clinical Impression/Discharge Summary: Patient has made consistent gains and has met 4 of 5 long-term goals this admission due to improved problem solving skills, selective attention during functional tasks, and overall awareness and insight into deficits and limitations. Patient exhibits improved speech intelligibility and implementation of speech strategies to obtain >90% intelligibility at conversation level. She also exhibits improved swallow safety with implementation of safe swallow techniques including slow rate and lingual sweep to clear left buccal pocketing. Patient is currently an overall sup for cognitive tasks, and min A for higher level problem solving. She will require supervision assist for overall safety at discharge, particularly with  medication management as discussed. Patient and family education is complete and patient's care partner is independent to provide the necessary physical and cognitive assistance at discharge. Patient would benefit from continued SLP services as outpatient to maximize cognitive function, safety, and functional independence.    Care Partner:  Caregiver Able to Provide Assistance: Yes  Type of Caregiver Assistance: Cognitive  Recommendation:  Outpatient SLP  Rationale for SLP Follow Up: Maximize cognitive function and independence   Equipment: NA   Reasons for discharge: Treatment goals met;Discharged from hospital   Patient/Family Agrees with Progress Made and Goals Achieved: Yes    Ricci Dirocco T Haizlee Henton 10/03/2020, 9:00 AM

## 2020-10-03 NOTE — Progress Notes (Signed)
Occupational Therapy Session Note  Patient Details  Name: Mackenzie Gonzalez MRN: [id number] Date of Birth: 1960/06/29  Today's Date: 10/03/2020 OT Individual Time: 5003-7048 OT Individual Time Calculation (min): 53 min + 24 min   Short Term Goals: Week 2:  OT Short Term Goal 1 (Week 2): STG = LTG 2/2 ELOS  Skilled Therapeutic Interventions/Progress Updates:    Session 1 (8891-6945): Pt received supine in bed, c/o ongoing R wrist/LUQ pain, but did not rate and agreeable to therapy. Session focus on self-care retraining, activity tolerance, BUE strengthening, LUE NMR, safety awareness, dynamic standing balance, dual task training in prep for improved ADL/IADL/func mobility performance + decreased caregiver burden. Pt came to sitting EOB and donned B shoes with ind. Declined shower/ADL this morning and requesting to go outside. STS and amb to hospital outdoor patio with close S + RW, cont to req consistent cues for safe RW use. Pt with tendency to trial ambulating without device without notifying therapist. Forde Dandy throughout mobility to challenge dual tasking/safety awareness. Demos improved RW use compared to previous sessions. Seated outside, pt completed 1x15 of the following with 1 kg medicine ball/level 3 theraband: B hamstring curls, B biceps curls, shoulder rows, ball toss and catch, chest press, and overhead reaches.  Self-propelled w/c back to hospital atrium with overall min A to avoid L obstacles/ascend incline. Therapeutic use of self utilized throughout session for emotional support as pt relates difficulty family relationships. Discussed joint protection principles to reduce increase in R wrist pain (keeping wrist in neutral, picking up heavy objects with B hands, etc.) 2/2 ongoing R wrist pain +CVA risk reduction education including smoking cessation, pt reports that sister smokes and it will be challenging to cease in such an environment. Discussed option to complete seated exercises as  a replacement, pt will cont to benefit from further safety education as pt reports being interested in getting on treadmill, completing standing exercises alone, etc.   Pt left seated in w/c with safety belt alarm engaged, call bell in reach, and all immediate needs met.    Session 2 [phone]): Pt received seated in w/c, no c/o pain, agreeable to additional therapy session. Session focus on IADL retraining, activity tolerance, func transfers, dynamic standing balance in prep for improved ADL/IADL/func mobility performance + decreased caregiver burden. STS and amb to and from therapy kitchen with close S + RW. Pt cont to fidget with RW and requires cues to leave B hands on grips/leave RW on ground. Pt primarily prepares microwave meals at home. Pt able to retrieve microwave meal from fridge, follow instructions to heat in microwave, and plate meal with min Vcs for item transport/safe RW use. Pt additionally able to wash dishes and place in dishwasher with no overt LOB. Reviewed kitchen set-up to help decrease falls risk (keeping freq used items within teach, etc.). Pt cont to req cues for safe object transport with RW despite initial demonstration. Amb transfer back to w/c  same manner as before.   Pt left seated in w/c with safety belt alarm engaged, call bell in reach, and all immediate needs met.    Therapy Documentation Precautions:  Precautions Precautions: Fall Precaution Comments: Left hemi, decreased safety awareness Restrictions Weight Bearing Restrictions: No  Pain: see session notes   ADL: See Care Tool for more details.  Therapy/Group: Individual Therapy  Volanda Napoleon MS, OTR/L  10/03/2020, 6:40 AM

## 2020-10-03 NOTE — Progress Notes (Signed)
Patient ID: Mackenzie Gonzalez, female   DOB: Jan 17, 1961, 60 y.o.   MRN: [id number] Patient declined by [facility], referral center to [facility]. Waiting on follow up   Erlene Quan, [place]

## 2020-10-03 NOTE — Progress Notes (Signed)
Physical Therapy Session Note  Patient Details  Name: Mackenzie Gonzalez MRN: [id number] Date of Birth: [birth year]  Today's Date: 10/03/2020 PT Individual Time: [id number] PT Individual Time Calculation (min): 26 min   Short Term Goals: Week 2:  PT Short Term Goal 1 (Week 2): STG = LTG d/t ELOS  Skilled Therapeutic Interventions/Progress Updates:    Patient in w/c and reports feeling okay.  Requesting to toilet.  Sit to stand S to RW and ambulated to bathroom with S.  Toileting with S and patient ambulated to dayroom x 120' with RW and S.  Seated then standing on Kinetron at 30 cm/sec x 1.5 minutes for LE strength.  Reported L hand pain and switched activities.  Patient ambulated around furniture set close table and two chairs using RW and cues for clearing obstacles. Patient performed balance activities at wall rail including side stepping, crossing over, forward march with 1 UE support and attempted toe walking, but had difficulty on L so performed bilat standing heel raises with UE support.  All issued for HEP. Patient ambulated with RW back to room 120' with S.  Left in w/c with alarm belt active and needs in reach.  Therapy Documentation Precautions:  Precautions Precautions: Fall Precaution Comments: Left hemi, decreased safety awareness Restrictions Weight Bearing Restrictions: No  Pain: Pain Assessment Pain Score: 8  Pain Type: Chronic pain Pain Location: Hand Pain Orientation: Right;Left Pain Descriptors / Indicators: Aching Pain Onset: With Activity Pain Intervention(s): Rest    Therapy/Group: Individual Therapy  Reginia Naas [place], PT 10/03/2020, 11:09 AM

## 2020-10-03 NOTE — Plan of Care (Signed)
  Problem: RH Balance Goal: LTG Patient will maintain dynamic standing with ADLs (OT) Description: LTG:  Patient will maintain dynamic standing balance with assist during activities of daily living (OT)  Outcome: Completed/Met   Problem: Sit to Stand Goal: LTG:  Patient will perform sit to stand in prep for activites of daily living with assistance level (OT) Description: LTG:  Patient will perform sit to stand in prep for activites of daily living with assistance level (OT) Outcome: Completed/Met   Problem: RH Eating Goal: LTG Patient will perform eating w/assist, cues/equip (OT) Description: LTG: Patient will perform eating with assist, with/without cues using equipment (OT) Outcome: Completed/Met   Problem: RH Grooming Goal: LTG Patient will perform grooming w/assist,cues/equip (OT) Description: LTG: Patient will perform grooming with assist, with/without cues using equipment (OT) Outcome: Completed/Met   Problem: RH Bathing Goal: LTG Patient will bathe all body parts with assist levels (OT) Description: LTG: Patient will bathe all body parts with assist levels (OT) Outcome: Completed/Met   Problem: RH Dressing Goal: LTG Patient will perform upper body dressing (OT) Description: LTG Patient will perform upper body dressing with assist, with/without cues (OT). Outcome: Completed/Met Goal: LTG Patient will perform lower body dressing w/assist (OT) Description: LTG: Patient will perform lower body dressing with assist, with/without cues in positioning using equipment (OT) Outcome: Completed/Met   Problem: RH Toileting Goal: LTG Patient will perform toileting task (3/3 steps) with assistance level (OT) Description: LTG: Patient will perform toileting task (3/3 steps) with assistance level (OT)  Outcome: Completed/Met   Problem: RH Functional Use of Upper Extremity Goal: LTG Patient will use RT/LT upper extremity as a (OT) Description: LTG: Patient will use right/left upper  extremity as a stabilizer/gross assist/diminished/nondominant/dominant level with assist, with/without cues during functional activity (OT) Outcome: Completed/Met   Problem: RH Simple Meal Prep Goal: LTG Patient will perform simple meal prep w/assist (OT) Description: LTG: Patient will perform simple meal prep with assistance, with/without cues (OT). Outcome: Completed/Met   Problem: RH Toilet Transfers Goal: LTG Patient will perform toilet transfers w/assist (OT) Description: LTG: Patient will perform toilet transfers with assist, with/without cues using equipment (OT) Outcome: Completed/Met   Problem: RH Tub/Shower Transfers Goal: LTG Patient will perform tub/shower transfers w/assist (OT) Description: LTG: Patient will perform tub/shower transfers with assist, with/without cues using equipment (OT) Outcome: Completed/Met

## 2020-10-03 NOTE — Plan of Care (Signed)
  Problem: RH Problem Solving Goal: LTG Patient will demonstrate problem solving for (SLP) Description: LTG:  Patient will demonstrate problem solving for basic/complex daily situations with cues  (SLP) Outcome: Adequate for Discharge   Problem: RH Swallowing Goal: LTG Patient will consume least restrictive diet using compensatory strategies with assistance (SLP) Description: LTG:  Patient will consume least restrictive diet using compensatory strategies with assistance (SLP) Outcome: Completed/Met   Problem: RH Attention Goal: LTG Patient will demonstrate this level of attention during functional activites (SLP) Description: LTG:  Patient will will demonstrate this level of attention during functional activites (SLP) Outcome: Completed/Met   Problem: RH Awareness Goal: LTG: Patient will demonstrate awareness during functional activites type of (SLP) Description: LTG: Patient will demonstrate awareness during functional activites type of (SLP) Outcome: Completed/Met   Problem: RH Expression Communication Goal: LTG Patient will increase speech intelligibility (SLP) Description: LTG: Patient will increase speech intelligibility at word/phrase/conversation level with cues, % of the time (SLP) Outcome: Completed/Met

## 2020-10-04 ENCOUNTER — Encounter (HOSPITAL_BASED_OUTPATIENT_CLINIC_OR_DEPARTMENT_OTHER): Payer: Self-pay

## 2020-10-04 ENCOUNTER — Telehealth: Payer: Self-pay | Admitting: Pharmacist

## 2020-10-04 ENCOUNTER — Ambulatory Visit (HOSPITAL_BASED_OUTPATIENT_CLINIC_OR_DEPARTMENT_OTHER): Admit: 2020-10-04 | Payer: Medicaid Other | Admitting: Orthopedic Surgery

## 2020-10-04 ENCOUNTER — Other Ambulatory Visit (HOSPITAL_COMMUNITY): Payer: Self-pay

## 2020-10-04 DIAGNOSIS — I6350 Cerebral infarction due to unspecified occlusion or stenosis of unspecified cerebral artery: Secondary | ICD-10-CM | POA: Diagnosis not present

## 2020-10-04 SURGERY — REMOVAL, HARDWARE
Anesthesia: Regional | Laterality: Right

## 2020-10-04 NOTE — Progress Notes (Signed)
Restful throughout shift anticipates discharge home today, monitor and assisted

## 2020-10-04 NOTE — Progress Notes (Signed)
Inpatient Rehabilitation Care Coordinator Discharge Note   Patient Details  Name: Mackenzie Gonzalez MRN: [id number] Date of Birth: 07/22/1960   Discharge location: Home  Length of Stay: 13 Days  Discharge activity level: Supervison/CGA  Home/community participation: Sister, Son providing assistance at home  Patient response [id number] Literacy - How often do you need to have someone help you when you read instructions, pamphlets, or other written material from your doctor or pharmacy?: Rarely  Patient response [id number] Isolation - How often do you feel lonely or isolated from those around you?: Rarely  Services provided included: MD, RD, PT, OT, RN, SLP, CM, Pharmacy, TR, Neuropsych, SW  Financial Services:  Charity fundraiser Utilized: Medicaid    Choices offered to/list presented to: pt  Follow-up services arranged:  [place]: [place]         Patient response to transportation need: Is the patient able to respond to transportation needs?: Yes In the past 12 months, has lack of transportation kept you from medical appointments or from getting medications?: No In the past 12 months, has lack of transportation kept you from meetings, work, or from getting things needed for daily living?: No    Comments (or additional information):  Patient/Family verbalized understanding of follow-up arrangements:  Yes  Individual responsible for coordination of the follow-up plan: Christy Sartorius [id number]  Confirmed correct DME delivered: Dyanne Iha 10/04/2020    Dyanne Iha

## 2020-10-04 NOTE — Progress Notes (Signed)
INPATIENT REHABILITATION DISCHARGE NOTE   Discharge instructions by: Linna Hoff, PA  Verbalized understanding: Patient and family member verbalized understanding  Skin care/Wound care: N/A  Pain: 0/10  IV's: N/A  Tubes/Drains: N/A  Safety instructions: Given to patient when explaining instructions  Patient belongings: sent home with patient  Discharged to: home   Discharged via: wheelchair on private car

## 2020-10-04 NOTE — Progress Notes (Signed)
PROGRESS NOTE   Subjective/Complaints:   Excited about d/c needs to go to [place] , sister will be picking her up   ROS: Denies CP, SOB, N/V/D  Objective:   No results found. No results for input(s): WBC, HGB, HCT, PLT in the last 72 hours.  No results for input(s): NA, K, CL, CO2, GLUCOSE, BUN, CREATININE, CALCIUM in the last 72 hours.   Intake/Output Summary (Last 24 hours) at 10/04/2020 0819 Last data filed at 10/03/2020 2100 Gross per 24 hour  Intake 356 ml  Output --  Net 356 ml        Physical Exam: Vital Signs Blood pressure (!) 127/95, pulse (!) 59, temperature 98 F (36.7 C), temperature source Oral, resp. rate 18, height '5\' 5"'$  (1.651 m), weight 82.7 kg, SpO2 100 %.   General: No acute distress Mood and affect are appropriate Heart: Regular rate and rhythm no rubs murmurs or extra sounds Lungs: Clear to auscultation, breathing unlabored, no rales or wheezes Abdomen: Positive bowel sounds, soft nontender to palpation, nondistended Extremities: No clubbing, cyanosis, or edema Skin: No evidence of breakdown, no evidence of rash    Left facial weakness Motor: LUE/LE: 3+/5 proximal distal, mild dysmetria LUE  5/5 on R side  Assessment/Plan: 1. Functional deficits due to CVA Stable for D/C today F/u PCP in 3-4 weeks F/u PM&R 2 weeks F/u neuro 1-2 mo See D/C summary See D/C instructions  No driving motor vehicle (has scooter) No smoking  Care Tool:  Bathing    Body parts bathed by patient: Left arm, Chest, Abdomen, Front perineal area, Right upper leg, Left upper leg, Face   Body parts bathed by helper: Right arm, Buttocks, Right lower leg, Left lower leg     Bathing assist Assist Level: Supervision/Verbal cueing     Upper Body Dressing/Undressing Upper body dressing   What is the patient wearing?: Pull over shirt    Upper body assist Assist Level: Independent    Lower Body  Dressing/Undressing Lower body dressing      What is the patient wearing?: Pants     Lower body assist Assist for lower body dressing: Supervision/Verbal cueing     Toileting Toileting    Toileting assist Assist for toileting: Independent with assistive device     Transfers Chair/bed transfer  Transfers assist     Chair/bed transfer assist level: Supervision/Verbal cueing Chair/bed transfer assistive device: Programmer, multimedia   Ambulation assist      Assist level: Supervision/Verbal cueing Assistive device: Walker-rolling Max distance: 250 ft   Walk 10 feet activity   Assist     Assist level: Supervision/Verbal cueing Assistive device: No Device   Walk 50 feet activity   Assist    Assist level: Supervision/Verbal cueing Assistive device: Walker-rolling    Walk 150 feet activity   Assist Walk 150 feet activity did not occur: Safety/medical concerns  Assist level: Contact Guard/Touching assist Assistive device: Walker-rolling    Walk 10 feet on uneven surface  activity   Assist Walk 10 feet on uneven surfaces activity did not occur: Safety/medical concerns   Assist level: Contact Guard/Touching assist Assistive device: Chemical engineer  Assist Is the patient using a wheelchair?: No Type of Wheelchair: Manual    Wheelchair assist level: Contact Guard/Touching assist      Wheelchair 50 feet with 2 turns activity    Assist        Assist Level: Contact Guard/Touching assist   Wheelchair 150 feet activity     Assist      Assist Level: Contact Guard/Touching assist   Blood pressure (!) 127/95, pulse (!) 59, temperature 98 F (36.7 C), temperature source Oral, resp. rate 18, height '5\' 5"'$  (1.651 m), weight 82.7 kg, SpO2 100 %.  Medical Problem List and Plan: 1.  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  Neurology plans 30-day cardiac event  monitor d/c 9/7 pt request meds to [facility], wants them to be sent by mail but too short of time frame, needs family to pick up meds for d/c 2.  Impaired mobility: -DVT/anticoagulation:  Mechanical: Sequential compression devices, below knee Bilateral lower extremities             -antiplatelet therapy: Continue Aspirin 81 mg daily and Plavix 75 mg daily x3 weeks then aspirin alone 3. Abdominal pain: epigastric , consistent with prior hx of ulcer, abd exam unremarkable , has had some constipation as well.  Will hold off on CT abd, pt plans to f/u with GI as OP , 5/22 CT abd showed diverticulosis no other findings  4. Mood/bipolar disorder: Continue Celexa 20 mg daily, Xanax 0.5 mg 3 times daily as needed             -antipsychotic agents: Seroquel 300 mg nightly 5. Neuropsych: This patient is capable of making decisions on her own behalf. 6. Skin/Wound Care: Routine skin checks 7. Fluids/Electrolytes/Nutrition: encourage PO  Po intake reasonable 8.  HIV.Biktarvy 50-2 100-25 mg daily 9.  History of tobacco as well as polysubstance abuse.  Urine drug screen positive marijuana.  Continue NicoDerm patch.  Provide counseling 10.  Hyperlipidemia: Lipitor 11.  Diverticulosis:  -continue bentyl q8 for spasms  -Continue daily milk of mag 40m  -had BM 8/29 good size  -KUB unremarkable on 8/31  States he was told she is to get a EGD +/- Lower endoscopy as outpatient 12. R wrist fx s/p hardware placement- needs to have pins removed (out of place)- surgery was scheduled 9/7- pt will call Dr OAngus Palmsoffice to schedule OV, if this is non urgent surgery would be best to wait 6 mo prior to elective given recent CVA neuro will need to give pre op clearance  13- Vitiligo- no issues, but skin can be more sensitive.  14. Urinary issues- continue oob to void, emptying  Improving 15.  Hypoalbuminemia  Supplement initiated on 9/2  LOS: 13 days A [place] TO [place]  Tane Biegler 10/04/2020, 8:19 AM

## 2020-10-04 NOTE — Progress Notes (Signed)
Physical Therapy Discharge Summary  Patient Details  Name: Mackenzie Gonzalez MRN: [id number] Date of Birth: Apr 21, 1960  Today's Date: 10/03/2020 PT Individual Time: 1031-1129  PT Individual Time Calculation (min): 58 min      Patient has met 9 of 9 long term goals due to improved activity tolerance, improved balance, improved postural control, increased strength, ability to compensate for deficits, functional use of  left upper extremity and left lower extremity, improved attention, improved awareness, and improved coordination.  Patient to discharge at an ambulatory level Supervision.   Patient's care partner is independent to provide the necessary  supervisory  assistance at discharge.  Reasons goals not met: n/a  Recommendation:  Patient will benefit from ongoing skilled PT services in home health setting to continue to advance safe functional mobility, address ongoing impairments in strength, coordination, balance, activity tolerance, cognition, safety awareness, and to minimize fall risk.  Equipment: Rolling walker  Reasons for discharge: treatment goals met and discharge from hospital  Patient/family agrees with progress made and goals achieved: Yes  PT Discharge Precautions/Restrictions Precautions Precautions: Fall Precaution Comments: Left hemi, decreased safety awareness Vital Signs   Pain Pain Assessment Pain Scale: 0-10 Pain Score: 0-No pain Pain Interference Pain Interference Pain Effect on Sleep: 1. Rarely or not at all Pain Interference with Therapy Activities: 1. Rarely or not at all Pain Interference with Day-to-Day Activities: 2. Occasionally Vision/Perception  Vision - History Ability to See in Adequate Light: 0 Adequate Vision - Assessment Eye Alignment: Within Functional Limits Ocular Range of Motion: Within Functional Limits Tracking/Visual Pursuits: Able to track stimulus in all quads without difficulty Perception Perception: Impaired Praxis Praxis:  Intact  Cognition Overall Cognitive Status: Impaired/Different from baseline Arousal/Alertness: Awake/alert Orientation Level: Oriented X4 Sustained Attention: Impaired Selective Attention: Impaired Awareness: Impaired Behaviors: Impulsive;Restless Safety/Judgment: Impaired Comments: Family endorses pt near baseline cognitively, cont to demonstrate decreased safety awareness/impulsivity with mobility Sensation Sensation Light Touch: Appears Intact Coordination Gross Motor Movements are Fluid and Coordinated: No Fine Motor Movements are Fluid and Coordinated: No Coordination and Movement Description: LUE>LLE hemi, greatly improved from eval Heel Shin Test: Improved from eval, decreased coordinated movement that improves with conscious control - able to complete Motor  Motor Motor: Other (comment);Ataxia (Hemipareisis) Motor - Discharge Observations: LUE>LLE hemi with mild ataxia  Mobility Bed Mobility Bed Mobility: Supine to Sit;Sit to Supine Supine to Sit: Independent with assistive device Sit to Supine: Independent with assistive device Transfers Transfers: Sit to Stand;Stand Pivot Transfers;Stand to Sit Sit to Stand: Independent with assistive device Stand to Sit: Independent with assistive device Stand Pivot Transfers: Supervision/Verbal cueing;Set up assist;Independent with assistive device Stand Pivot Transfer Details: Verbal cues for precautions/safety Transfer (Assistive device): Rolling walker Locomotion  Gait Ambulation: Yes Gait Assistance: Supervision/Verbal cueing;Independent with assistive device Gait Distance (Feet): 250 Feet Assistive device: Rolling walker Gait Assistance Details: Tactile cues for posture;Verbal cues for precautions/safety;Verbal cues for gait pattern Gait Gait: Yes Gait Pattern: Impaired Gait Pattern: Step-through pattern;Decreased step length - left;Decreased hip/knee flexion - left;Lateral trunk lean to right Gait velocity:  Decreased High Level Ambulation High Level Ambulation: Side stepping;Backwards walking Stairs / Additional Locomotion Stairs: Yes Stairs Assistance: Supervision/Verbal cueing Stair Management Technique: Two rails Number of Stairs: 8 Height of Stairs: 6 Ramp: Supervision/Verbal cueing Curb: Supervision/Verbal cueing Pick up small object from the floor assist level: Supervision/Verbal cueing Pick up small object from the floor assistive device: RW Wheelchair Mobility Wheelchair Mobility: No  Trunk/Postural Assessment  Cervical Assessment Cervical Assessment: Within Functional Limits  Thoracic Assessment Thoracic Assessment: Exceptions to [facility] (rounded shoulders) Lumbar Assessment Lumbar Assessment: Exceptions to [facility] (posterior pelvic tilt) Postural Control Postural Control: Deficits on evaluation (decreased postural control with dynamic standing tasks and gait)  Balance Balance Balance Assessed: Yes Standardized Balance Assessment Standardized Balance Assessment: Timed Up and Go Test;Berg Balance Test Berg Balance Test Sit to Stand: Able to stand without using hands and stabilize independently Standing Unsupported: Able to stand safely 2 minutes Sitting with Back Unsupported but Feet Supported on Floor or Stool: Able to sit safely and securely 2 minutes Stand to Sit: Controls descent by using hands Transfers: Able to transfer safely, definite need of hands Standing Unsupported with Eyes Closed: Able to stand 10 seconds with supervision Standing Ubsupported with Feet Together: Able to place feet together independently and stand for 1 minute with supervision From Standing, Reach Forward with Outstretched Arm: Can reach confidently >25 cm (10") From Standing Position, Pick up Object from Floor: Able to pick up shoe, needs supervision From Standing Position, Turn to Look Behind Over each Shoulder: Looks behind from both sides and weight shifts well Turn 360 Degrees: Able to turn 360  degrees safely one side only in 4 seconds or less Standing Unsupported, Alternately Place Feet on Step/Stool: Able to complete >2 steps/needs minimal assist Standing Unsupported, One Foot in Front: Able to plae foot ahead of the other independently and hold 30 seconds Standing on One Leg: Able to lift leg independently and hold 5-10 seconds Total Score: 45 Timed Up and Go Test TUG: Normal TUG Normal TUG (seconds): 17.44 (no AD) Static Sitting Balance Static Sitting - Balance Support: Feet supported Static Sitting - Level of Assistance: 7: Independent Dynamic Sitting Balance Dynamic Sitting - Balance Support: Feet supported Dynamic Sitting - Level of Assistance: 6: Modified independent (Device/Increase time) Dynamic Sitting - Balance Activities: Reaching for objects;Reaching across midline;Reaching for weighted objects;Forward lean/weight shifting;Morrison;Lateral lean/weight shifting Static Standing Balance Static Standing - Balance Support: During functional activity;No upper extremity supported;Bilateral upper extremity supported Static Standing - Level of Assistance: 6: Modified independent (Device/Increase time) Dynamic Standing Balance Dynamic Standing - Balance Support: During functional activity;Bilateral upper extremity supported Dynamic Standing - Level of Assistance: 5: Stand by assistance Dynamic Standing - Comments: Supervision Extremity Assessment      RLE Assessment RLE Assessment: Within Functional Limits General Strength Comments: 4+5 globally LLE Assessment LLE Assessment: Exceptions to [facility] LLE Strength Left Hip Flexion: 4-/5 Left Hip Extension: 4-/5 Left Hip ABduction: 4/5 Left Hip ADduction: 4/5 Left Knee Flexion: 4/5 Left Knee Extension: 4+/5 Left Ankle Dorsiflexion: 4/5 Left Ankle Plantar Flexion: 4/5   PT instructed pt in Grad day assessment to measure progress toward goals. See above/ below for details.   Patient seated upright in w/c on entrance to  room. Patient alert and agreeable to PT session.   Patient with no new pain complaint throughout session. Continued pain in R hand that does not limit pt's performance or participation  Therapeutic Activity: Transfers: Patient performed sit<>stand and stand pivot transfers throughout session with Mod I. No vc/ tc required for technique. Pt reminded to always take time in all mobility.   Gait Training:  Patient ambulated >250 ft with no AD and supervision. Provided with intermittent vc/ tc for upright posture, heel strike. Provided with mirror to demo pt's increased lateral trunk movement to R  with LLE step through. Instruction for upright  hold of UB with heel strike and conscious glute contraction for hip extension. Demonstrated improved quality of  gait noted by pt in mirror. Pt ambulates 170' using RW with improved ataxia and supervision/ Mod I. Reminder to pt for conscious upright posture keeping UB still and increasing L step height with improved heel strike.    Neuromuscular Re-ed: NMR facilitated during session with focus on standing balance. Pt guided in performance of TUG, Berg, and aspects of DGI.   TUG completed 17.44 seconds  (Improved over 36 seconds on eval.) (A score of >13.5 seconds indicates patient is at a high fall risk. Pt educated on interpretation of their score)   Patient demonstrates increased fall risk as noted by score of  45/56 on Berg Balance Scale.  (<36= high risk for falls, close to 100%; 37-45 significant >80%; 46-51 moderate >50%; 52-55 lower >25%)  Patient seated upright  in w/c at end of session with brakes locked, belt alarm set, and all needs within reach.   Alger Simons PT, DPT 10/03/2020, 5:16 PM

## 2020-10-04 NOTE — Telephone Encounter (Signed)
Allen was discharged today from [facility] after a new stroke. She was started on atorvastatin and Plavix which can both interact with her Genvoya. The inpatient team switched her regimen to [facility] to avoid these drug interactions. She does not follow-up with Dr. Johnnye Sima until December, so I will have her follow-up with me on 9/28 at 10:45 to address her adherence and check her HIV RNA.  Explained that Phillips Odor is a one pill once daily medication with or without food and the importance of not missing any doses. Explained resistance and how it develops and why it is so important to take Biktarvy daily and not skip days or doses. Counseled patient to take it around the same time each day. Counseled on what to do if dose is missed, if closer to missed dose take immediately, if closer to next dose then skip and resume normal schedule. Cautioned on possible side effects the first week or so including nausea, diarrhea, dizziness, and headaches but that they should resolve after the first couple of weeks. I reviewed patient medications and found one drug interaction with her sucralfate. She understands the best method to avoid the interaction is to take her Biktarvy right when she wakes up and wait a couple of hours before eating and taking her sucralfate. Counseled patient to separate Biktarvy from divalent cations including multivitamins. Discussed with patient to call clinic if she starts a new medication or herbal supplement.   Alfonse Spruce, PharmD, CPP Clinical Pharmacist Practitioner [facility]

## 2020-10-04 NOTE — Plan of Care (Signed)
  Problem: RH Balance Goal: LTG Patient will maintain dynamic sitting balance (PT) Description: LTG:  Patient will maintain dynamic sitting balance with assistance during mobility activities (PT) Outcome: Completed/Met Goal: LTG Patient will maintain dynamic standing balance (PT) Description: LTG:  Patient will maintain dynamic standing balance with assistance during mobility activities (PT) Outcome: Completed/Met Flowsheets (Taken 10/03/2020 1822) LTG: Pt will maintain dynamic standing balance during mobility activities with:: Independent with assistive device    Problem: Sit to Stand Goal: LTG:  Patient will perform sit to stand with assistance level (PT) Description: LTG:  Patient will perform sit to stand with assistance level (PT) Outcome: Completed/Met Flowsheets (Taken 10/03/2020 1822) LTG: PT will perform sit to stand in preparation for functional mobility with assistance level: Independent with assistive device   Problem: RH Bed Mobility Goal: LTG Patient will perform bed mobility with assist (PT) Description: LTG: Patient will perform bed mobility with assistance, with/without cues (PT). Outcome: Completed/Met Flowsheets (Taken 10/03/2020 1822) LTG: Pt will perform bed mobility with assistance level of: Independent with assistive device    Problem: RH Bed to Chair Transfers Goal: LTG Patient will perform bed/chair transfers w/assist (PT) Description: LTG: Patient will perform bed to chair transfers with assistance (PT). Outcome: Completed/Met Flowsheets (Taken 10/03/2020 1822) LTG: Pt will perform Bed to Chair Transfers with assistance level: Independent with assistive device    Problem: RH Car Transfers Goal: LTG Patient will perform car transfers with assist (PT) Description: LTG: Patient will perform car transfers with assistance (PT). Outcome: Completed/Met   Problem: RH Ambulation Goal: LTG Patient will ambulate in controlled environment (PT) Description: LTG: Patient  will ambulate in a controlled environment, # of feet with assistance (PT). Outcome: Completed/Met Flowsheets (Taken 09/22/2020 1916 by Henrene Pastor, Student-PT) LTG: Ambulation distance in controlled environment: 150 Goal: LTG Patient will ambulate in home environment (PT) Description: LTG: Patient will ambulate in home environment, # of feet with assistance (PT). Outcome: Completed/Met Flowsheets (Taken 09/22/2020 1916 by Henrene Pastor, Student-PT) LTG: Pt will ambulate in home environ  assist needed:: Independent with assistive device LTG: Ambulation distance in home environment: 50   Problem: RH Stairs Goal: LTG Patient will ambulate up and down stairs w/assist (PT) Description: LTG: Patient will ambulate up and down # of stairs with assistance (PT) Outcome: Completed/Met Flowsheets (Taken 10/03/2020 1822) LTG: Pt will  ambulate up and down number of stairs: 8

## 2020-10-06 ENCOUNTER — Telehealth: Payer: Self-pay

## 2020-10-06 NOTE — Telephone Encounter (Signed)
Transition Care Management Unsuccessful Follow-up Telephone Call  Date of discharge and from where:  10/04/2020  Attempts:  2nd Attempt  Reason for unsuccessful TCM follow-up call:  Left voice message

## 2020-10-06 NOTE — Telephone Encounter (Signed)
Transition Care Management Unsuccessful Follow-up Telephone Call  Date of discharge and from where:  10/04/2020  Attempts:  1st Attempt  Reason for unsuccessful TCM follow-up call:  Left voice message

## 2020-10-10 ENCOUNTER — Other Ambulatory Visit (HOSPITAL_COMMUNITY): Payer: Self-pay

## 2020-10-10 ENCOUNTER — Encounter (HOSPITAL_COMMUNITY): Payer: Self-pay | Admitting: Psychiatry

## 2020-10-10 ENCOUNTER — Other Ambulatory Visit: Payer: Self-pay

## 2020-10-10 ENCOUNTER — Telehealth (INDEPENDENT_AMBULATORY_CARE_PROVIDER_SITE_OTHER): Payer: Medicaid Other | Admitting: Psychiatry

## 2020-10-10 DIAGNOSIS — F250 Schizoaffective disorder, bipolar type: Secondary | ICD-10-CM

## 2020-10-10 MED ORDER — CITALOPRAM HYDROBROMIDE 20 MG PO TABS
20.0000 mg | ORAL_TABLET | Freq: Every day | ORAL | 3 refills | Status: DC
Start: 2020-10-10 — End: 2021-01-11
  Filled 2020-10-10 – 2020-10-26 (×2): qty 30, 30d supply, fill #0
  Filled 2020-10-26: qty 30, 30d supply, fill #1

## 2020-10-10 MED ORDER — QUETIAPINE FUMARATE 300 MG PO TABS
300.0000 mg | ORAL_TABLET | Freq: Every day | ORAL | 3 refills | Status: DC
Start: 2020-10-10 — End: 2021-01-11
  Filled 2020-10-10 – 2020-10-26 (×2): qty 30, 30d supply, fill #0
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
## 2020-10-11 ENCOUNTER — Telehealth: Payer: Self-pay | Admitting: *Deleted

## 2020-10-11 NOTE — Telephone Encounter (Signed)
Dorian, PT with [facility] called in requesting VO for [facility] PT 1 week 4-6 weeks to work on gait, strengthening, balance, and endurance. Verbal auth given. Will route to PCP for agreement/denial.

## 2020-10-13 ENCOUNTER — Other Ambulatory Visit (HOSPITAL_COMMUNITY): Payer: Self-pay

## 2020-10-17 ENCOUNTER — Encounter (INDEPENDENT_AMBULATORY_CARE_PROVIDER_SITE_OTHER): Payer: Medicaid Other

## 2020-10-17 DIAGNOSIS — Z7982 Long term (current) use of aspirin: Secondary | ICD-10-CM

## 2020-10-17 DIAGNOSIS — K5900 Constipation, unspecified: Secondary | ICD-10-CM

## 2020-10-17 DIAGNOSIS — I4891 Unspecified atrial fibrillation: Secondary | ICD-10-CM | POA: Diagnosis not present

## 2020-10-17 DIAGNOSIS — I639 Cerebral infarction, unspecified: Secondary | ICD-10-CM | POA: Diagnosis not present

## 2020-10-17 DIAGNOSIS — K589 Irritable bowel syndrome without diarrhea: Secondary | ICD-10-CM

## 2020-10-17 DIAGNOSIS — F17210 Nicotine dependence, cigarettes, uncomplicated: Secondary | ICD-10-CM

## 2020-10-17 DIAGNOSIS — R1310 Dysphagia, unspecified: Secondary | ICD-10-CM

## 2020-10-17 DIAGNOSIS — F419 Anxiety disorder, unspecified: Secondary | ICD-10-CM

## 2020-10-17 DIAGNOSIS — F319 Bipolar disorder, unspecified: Secondary | ICD-10-CM

## 2020-10-17 DIAGNOSIS — N3946 Mixed incontinence: Secondary | ICD-10-CM

## 2020-10-17 DIAGNOSIS — K219 Gastro-esophageal reflux disease without esophagitis: Secondary | ICD-10-CM

## 2020-10-17 DIAGNOSIS — G4700 Insomnia, unspecified: Secondary | ICD-10-CM

## 2020-10-17 DIAGNOSIS — G8929 Other chronic pain: Secondary | ICD-10-CM

## 2020-10-17 DIAGNOSIS — J42 Unspecified chronic bronchitis: Secondary | ICD-10-CM | POA: Diagnosis not present

## 2020-10-17 DIAGNOSIS — B20 Human immunodeficiency virus [HIV] disease: Secondary | ICD-10-CM

## 2020-10-17 DIAGNOSIS — I69354 Hemiplegia and hemiparesis following cerebral infarction affecting left non-dominant side: Secondary | ICD-10-CM | POA: Diagnosis not present

## 2020-10-17 DIAGNOSIS — E785 Hyperlipidemia, unspecified: Secondary | ICD-10-CM | POA: Diagnosis not present

## 2020-10-17 DIAGNOSIS — F259 Schizoaffective disorder, unspecified: Secondary | ICD-10-CM | POA: Diagnosis not present

## 2020-10-17 DIAGNOSIS — F99 Mental disorder, not otherwise specified: Secondary | ICD-10-CM

## 2020-10-20 ENCOUNTER — Encounter (INDEPENDENT_AMBULATORY_CARE_PROVIDER_SITE_OTHER): Payer: Self-pay

## 2020-10-23 ENCOUNTER — Other Ambulatory Visit (HOSPITAL_COMMUNITY): Payer: Self-pay

## 2020-10-24 ENCOUNTER — Telehealth: Payer: Self-pay

## 2020-10-24 ENCOUNTER — Other Ambulatory Visit: Payer: Self-pay

## 2020-10-24 ENCOUNTER — Other Ambulatory Visit (HOSPITAL_COMMUNITY): Payer: Self-pay

## 2020-10-24 ENCOUNTER — Telehealth: Payer: Self-pay | Admitting: Gastroenterology

## 2020-10-24 NOTE — Telephone Encounter (Signed)
Patient called requested to speak with a nurse regarding a hospital stay she had at the end of August and wants to make sure she can still proceed with her procedure.

## 2020-10-24 NOTE — Telephone Encounter (Signed)
Called pt and informed her procedure for 10/5 @ [facility] will need to be canceled d/t needing neuro clearance and permission to hold Plavix. Advised this request has been sent to Dr. Letta Pate today for him to address during her hosp f/u appt on 10/31/20. Further advised we will await his response BEFORE proceeding with rescheduling procedure. Advised this will likely need to be rescheduled to the hospital given the circumstances from her most recent hosp admission. Verbalized acceptance and understanding of all information provided.

## 2020-10-24 NOTE — Telephone Encounter (Signed)
Please review hospital course as below. Pt scheduled at [facility] 11/01/20 which appears may need to be rescheduled to [facility] pending neuro clearance and Plavix washout. Most importantly, uncertain if you would prefer to delay colon until 2023:  Brief HPI:   Mackenzie Gonzalez is a 60 y.o. right-handed female with history significant for HIV 2015 hepatitis C chronic bronchitis IBS chronic tobacco polysubstance abuse anxiety/bipolar disorder.  Per chart review lives with sister.  Ambulated independently prior to admission.  Presented 09/17/2020 with acute onset of dizziness left facial droop slurred speech and left-sided weakness.  Cranial CT scan negative for acute changes.  CT angiogram head and neck no large vessel occlusion or significant stenosis.  Patient did not receive tPA.  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  Mild associated edema without mass-effect.  Admission chemistries unremarkable urine drug screen positive benzos as well as marijuana.  Most recent CD4 count June 22 noted to be 869 on most recent HIV RNA viral load to be 28 on November 2021.  Echocardiogram with ejection fraction of 55 to 60% no wall motion abnormalities.  Currently maintained on aspirin and Plavix for CVA prophylaxis x3 weeks and aspirin alone.  Plan for 30-day cardiac event monitor.  Patient was initially on Genvoya for HIV which impaired conversion of Plavix to active form thus discontinued started on Biktarvy.  Tolerating a regular diet.  Therapy evaluations completed due to patient's left-sided weakness and dysarthria was admitted for a comprehensive rehab program.     Hospital Course: Mackenzie Gonzalez was admitted to rehab 09/21/2020 for inpatient therapies to consist of PT, ST and OT at least three hours five days a week. Past admission physiatrist, therapy team and rehab RN have worked together to provide customized collaborative inpatient rehab.  Pertaining to patient's right pons right cerebellar infarction  remained stable remained on aspirin and Plavix x3 weeks and aspirin alone.  SCDs for DVT prophylaxis.  He did have some abdominal discomfort consistent with prior history of ulcer CT abdomen 5/22 showed diverticulosis no other findings.  He was started on Bentyl every 8 hours for spasms bowel movements well regulated KUB 8/31 unremarkable.  Mood with bipolar disorder Celexa Xanax as advised as well as Seroquel at night he was attending therapies.  HIV ongoing transition made to [place].  Right wrist fracture status post hardware placement needs to have pins removed surgery was scheduled 9/7 with Dr. Apolonio Schneiders would follow-up outpatient.  Patient with history of tobacco polysubstance use receiving counsel guards to cessation of illicit drug products alcohol and tobacco.

## 2020-10-24 NOTE — Telephone Encounter (Signed)
NEUROLOGICAL CLEARANCE AND REQUEST TO HOLD ANTICOAG    KINSLEI LABINE 1960/06/22 [id number]  NOTE - Pt is scheduled for hospital f/u with you on 10/31/20  Procedure: EGD and Colonoscopy Anesthesia type:  MAC Procedure Date: TO BE DETERMINED; Pending clearance Provider: Dr. Tarri Glenn  Type of Clearance needed: Pharmacy and Neurological  Medication(s) needing held: Plavix   Length of time for medication to be held: 5-7 days  Please review request and advise by either responding to this message or by sending your response to the fax # provided below.  Thank you,  [facility]  Phone: [phone] Fax: [phone] ATTENTION: Alvia Jablonski, LPN

## 2020-10-25 ENCOUNTER — Other Ambulatory Visit: Payer: Self-pay

## 2020-10-25 ENCOUNTER — Other Ambulatory Visit (HOSPITAL_COMMUNITY): Payer: Self-pay

## 2020-10-25 ENCOUNTER — Telehealth: Payer: Self-pay

## 2020-10-25 ENCOUNTER — Other Ambulatory Visit: Payer: Self-pay | Admitting: Pharmacist

## 2020-10-25 ENCOUNTER — Ambulatory Visit (INDEPENDENT_AMBULATORY_CARE_PROVIDER_SITE_OTHER): Payer: Medicaid Other | Admitting: Pharmacist

## 2020-10-25 DIAGNOSIS — B20 Human immunodeficiency virus [HIV] disease: Secondary | ICD-10-CM

## 2020-10-25 DIAGNOSIS — Z23 Encounter for immunization: Secondary | ICD-10-CM

## 2020-10-25 MED ORDER — BICTEGRAVIR-EMTRICITAB-TENOFOV 50-200-25 MG PO TABS
1.0000 | ORAL_TABLET | Freq: Every day | ORAL | 0 refills | Status: DC
  Filled 2020-10-25: qty 90, 90d supply, fill #0

## 2020-10-25 NOTE — Telephone Encounter (Signed)
Called pt to inform about Dr. Letta Pate response below. Need to inquire further to determine if pt has established with a neurologist. LVM requesting returned call.

## 2020-10-25 NOTE — Progress Notes (Signed)
10/25/2020  HPI: Mackenzie Gonzalez is a 60 y.o. female who presents to the [facility] for HIV follow-up.  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   Stroke (cerebrum) ([place]) 09/18/2020   Facial droop 09/17/2020   Left hemiparesis ([place]) 09/17/2020   Right wrist fracture, sequela 07/11/2020   Dysphagia 06/29/2020   Left wrist fracture, sequela 06/25/2020   Tobacco dependence 06/18/2020   Anemia due to GI blood loss    Esophagitis, [place] grade D    HIV (human immunodeficiency virus infection) ([place])    Psychiatric disorder    Hand injuries, unspecified laterality, sequela 06/16/2020   Herpes zoster 03/16/2020   Mixed stress and urge urinary incontinence 03/16/2020   Suspected orbital vs preseptal cellultiis 01/14/2020   Constipation 01/07/2020   Vasomotor symptoms due to menopause 12/15/2019   Pain in joint involving multiple sites 12/15/2019   Encounter for preventive care 12/15/2019   Simple chronic bronchitis ([place]) 09/02/2019   Anxiety disorder 07/12/2019   Chronic pain 05/25/2019   Hepatitis B immune 04/20/2019   Hepatitis A immune 04/20/2019   Chronic viral hepatitis C ([place]) 04/20/2019   HIV disease ([place]) 04/20/2019   Tobacco abuse 04/20/2019   Schizoaffective disorder, bipolar type ([place]) 04/20/2019   GERD (gastroesophageal reflux disease) 2015   Slurred speech 2015    Patient's Medications  New Prescriptions   No medications on file  Previous Medications   ALBUTEROL (VENTOLIN HFA) 108 (90 BASE) MCG/ACT INHALER    Inhale 2 puffs into the lungs every 6 (six) hours as needed for wheezing or shortness of breath.   ALPRAZOLAM (XANAX) 0.5 MG  TABLET    Take 1 tablet (0.5 mg total) by mouth 3 (three) times daily as needed for anxiety.   ASPIRIN 81 MG CHEWABLE TABLET    Chew 1 tablet (81 mg total) by mouth daily.   ATORVASTATIN (LIPITOR) 80 MG TABLET    Take 1 tablet (80 mg total) by mouth daily.   BICTEGRAVIR-EMTRICITABINE-TENOFOVIR AF (BIKTARVY) 50-200-25 MG TABS TABLET    Take 1 tablet by mouth daily.   BISACODYL (DULCOLAX) 5 MG EC TABLET    Take 5 mg by mouth daily as needed for moderate constipation.   CITALOPRAM (CELEXA) 20 MG TABLET    Take 1 tablet (20 mg total) by mouth daily.   CLOPIDOGREL (PLAVIX) 75 MG TABLET    Take 1 tablet (75 mg total) by mouth daily.   CYANOCOBALAMIN 1000 MCG TABLET    Take 1 tablet (1,000 mcg total) by mouth daily.   DICYCLOMINE (BENTYL) 10 MG CAPSULE    Take 1 capsule (10 mg total) by mouth 3 (three) times daily before meals.   FLUTICASONE (FLONASE) 50 MCG/ACT NASAL SPRAY    Place 2 sprays into both nostrils daily as needed for allergies.   LINACLOTIDE (LINZESS) 145 MCG CAPS CAPSULE    Take 1 capsule (145 mcg total) by mouth daily before breakfast.   METHOCARBAMOL (ROBAXIN) 500 MG TABLET    Take 1 tablet (500 mg total) by mouth every 6 (six) hours as needed for muscle spasms.   PANTOPRAZOLE (PROTONIX) 40 MG TABLET    Take  1 tablet (40 mg total) by mouth daily.   POLYETHYLENE GLYCOL (MIRALAX / GLYCOLAX) 17 G PACKET    Take 17 g by mouth 2 (two) times daily as needed for mild constipation.   PSYLLIUM (HYDROCIL/METAMUCIL) 95 % PACK    Mix 1 packet as directed and take by mouth daily.   QUETIAPINE (SEROQUEL) 300 MG TABLET    Take 1 tablet (300 mg total) by mouth at bedtime.   SENNA-DOCUSATE (SENOKOT-S) 8.6-50 MG TABLET    Take 1 tablet by mouth 2 (two) times daily.   SUCRALFATE (CARAFATE) 1 G TABLET    Take 1 tablet (1 g total) by mouth 4 (four) times daily -  with meals and at bedtime.   VALACYCLOVIR (VALTREX) 1000 MG TABLET    Take 1,000 mg by mouth See admin instructions. Take 1 tablet by mouth three  times daily ONLY when having an active break out  Modified Medications   No medications on file  Discontinued Medications   No medications on file    Allergies: Allergies  Allergen Reactions   Haldol [Haloperidol] Other (See Comments)    Makes jittery    Past Medical History: Past Medical History:  Diagnosis Date   Anxiety    Bipolar 1 disorder ([place])    Chronic bronchitis ([place])    Hepatitis C carrier ([place])    HIV (human immunodeficiency virus infection) ([place]) 2015   HIV (human immunodeficiency virus infection) ([place])    Psychiatric disorder    reported h/o schizoaffective, bipolar, anxiety   Schizoaffective disorder ([place])     Social History: Social History   Socioeconomic History   Marital status: Single    Spouse name: Not on file   Number of children: Not on file   Years of education: Not on file   Highest education level: Not on file  Occupational History   Not on file  Tobacco Use   Smoking status: Every Day    Packs/day: 0.50    Years: 48.00    Pack years: 24.00    Types: Cigarettes   Smokeless tobacco: Never  Vaping Use   Vaping Use: Never used  Substance and Sexual Activity   Alcohol use: Not Currently   Drug use: Not Currently    Types: "Crack" cocaine, IV    Comment: former   Sexual activity: Not Currently    Partners: Female, Female  Other Topics Concern   Not on file  Social History Narrative   ** Merged History Encounter **       Social Determinants of Health   Financial Resource Strain: Not on file  Food Insecurity: Not on file  Transportation Needs: Not on file  Physical Activity: Not on file  Stress: Not on file  Social Connections: Not on file    Labs: Lab Results  Component Value Date   HIV1RNAQUANT NOT DETECTED 07/11/2020   HIV1RNAQUANT 28 (H) 11/30/2019   HIV1RNAQUANT 42 (H) 07/27/2019   CD4TABS 869 07/11/2020   CD4TABS 733 11/30/2019   CD4TABS 564 07/27/2019    RPR and STI Lab Results  Component Value Date   LABRPR  NON-REACTIVE 07/11/2020   LABRPR NON REACTIVE 06/20/2020   LABRPR NON-REACTIVE 04/20/2019    STI Results GC CT  04/20/2019 Negative Negative    Hepatitis B No results found for: HEPBSAB, HEPBSAG, HEPBCAB Hepatitis C Lab Results  Component Value Date   HCVRNAPCRQN <15 01/07/2020   Hepatitis A No results found for: HAV Lipids: Lab Results  Component Value Date  CHOL 232 (H) 09/18/2020   TRIG 161 (H) 09/18/2020   HDL 36 (L) 09/18/2020   CHOLHDL 6.4 09/18/2020   VLDL 32 09/18/2020   LDLCALC 164 (H) 09/18/2020    Current HIV Regimen: Biktarvy   Assessment:  Patient presents to [facility] after recent hospital encounter for a stroke. During the hospital encounter, the patient was switched from [facility] to [place] due to a reaction with Plavix which would reduce Plavix concentrations. Patient reports no issues with adherence or side effects. Patient states in the future she would like Biktarvy filled for a 90 days supply. Advised the patient to take Biktarvy first thing in the morning to separate out from her Carafate by 2 hours. Patient also asked during the visit if we still had a food bank in the clinic and a bag of food was provided to the patient.   Patient also opted to receive the influenza vaccine during the visit.   Plan: - Continue Biktarva daily  - Influenza vaccine IM x1  - F/U with Dr. Johnnye Sima - F/U HIV RNA, CD4 count   Adria Dill, PharmD PGY-1 Acute Care Resident  10/25/2020 11:02 AM

## 2020-10-25 NOTE — Telephone Encounter (Signed)
Pharmacy refill request for Biktarvy 50-200-25 mg.

## 2020-10-26 ENCOUNTER — Other Ambulatory Visit (HOSPITAL_COMMUNITY): Payer: Self-pay

## 2020-10-26 ENCOUNTER — Other Ambulatory Visit: Payer: Self-pay | Admitting: Physician Assistant

## 2020-10-26 LAB — T-HELPER CELL (CD4) - (RCID CLINIC ONLY)
CD4 % Helper T Cell: 42 % (ref 33–65)
CD4 T Cell Abs: 749 /uL (ref 400–1790)

## 2020-10-26 NOTE — Telephone Encounter (Signed)
NEUROLOGICAL CLEARANCE AND REQUEST TO HOLD ANTICOAG    Mackenzie Gonzalez 15-Sep-1960 [id number]   NOTE - Pt is scheduled for hospital f/u with you on 10/31/20   Procedure: EGD and Colonoscopy Anesthesia type:  MAC Procedure Date: TO BE DETERMINED; Pending clearance Provider: Dr. Tarri Glenn   Type of Clearance needed: Pharmacy and Neurological  Medication(s) needing held: Plavix    Length of time for medication to be held: 5-7 days   Please review request and advise by either responding to this message or by sending your response to the fax # provided below.   Thank you,   [facility]  Phone: [phone] Fax: [phone] ATTENTION: Gabriellia Rempel, LPN  Called pt to inquire further about the name of the neurologist with whom she has established. States she has established care with [facility]. Advised I will forward the request for neuro clearance (noted above) to their office for completion. Verbalized acceptance and understanding.

## 2020-10-27 ENCOUNTER — Other Ambulatory Visit (HOSPITAL_COMMUNITY): Payer: Self-pay

## 2020-10-27 ENCOUNTER — Telehealth: Payer: Self-pay | Admitting: Physical Medicine & Rehabilitation

## 2020-10-27 NOTE — Telephone Encounter (Signed)
Orders approved and given.

## 2020-10-27 NOTE — Telephone Encounter (Signed)
[facility] [phone] call to give verbal orders Ot/1x/ for 4 weeks

## 2020-10-27 NOTE — Telephone Encounter (Signed)
Called pharmacy to let them know to send to PCP

## 2020-10-28 LAB — HIV-1 RNA QUANT-NO REFLEX-BLD
HIV 1 RNA Quant: NOT DETECTED Copies/mL
HIV-1 RNA Quant, Log: NOT DETECTED Log cps/mL

## 2020-10-30 ENCOUNTER — Other Ambulatory Visit (HOSPITAL_COMMUNITY): Payer: Self-pay

## 2020-10-30 ENCOUNTER — Other Ambulatory Visit: Payer: Self-pay

## 2020-10-30 MED ORDER — SUCRALFATE 1 G PO TABS
1.0000 g | ORAL_TABLET | Freq: Three times a day (TID) | ORAL | 0 refills | Status: DC
  Filled 2020-10-30: qty 90, 23d supply, fill #0

## 2020-10-30 NOTE — Telephone Encounter (Signed)
Patient has extensive medication list, all meds were filled by another provider at a different practice.  Patient has an upcoming appt w/ Dr. Marva Panda on 11/07/20.  TC to patient , she was notified of above, an earlier appt was offered to patient to discuss med management, she declined, states she will have enough medications until her appt on 11/07/20.  States she will run out of the Carafate.  Will send refill request to blue team for consideration. SChaplin, RN,BSN

## 2020-10-30 NOTE — Telephone Encounter (Signed)
Requesting all meds to be filled @ [facility].

## 2020-10-31 ENCOUNTER — Encounter: Payer: Self-pay | Admitting: Physical Medicine & Rehabilitation

## 2020-10-31 ENCOUNTER — Other Ambulatory Visit (HOSPITAL_COMMUNITY): Payer: Self-pay

## 2020-10-31 ENCOUNTER — Other Ambulatory Visit: Payer: Self-pay

## 2020-10-31 ENCOUNTER — Encounter: Payer: Medicaid Other | Attending: Physical Medicine & Rehabilitation | Admitting: Physical Medicine & Rehabilitation

## 2020-10-31 VITALS — BP 103/75 | HR 70 | Temp 98.4°F | Ht 65.0 in | Wt 194.4 lb

## 2020-10-31 DIAGNOSIS — I6350 Cerebral infarction due to unspecified occlusion or stenosis of unspecified cerebral artery: Secondary | ICD-10-CM | POA: Diagnosis present

## 2020-10-31 NOTE — Progress Notes (Signed)
Subjective:    Patient ID: Mackenzie Gonzalez, female    DOB: 10/05/1960, 60 y.o.   MRN: [id number] 60 y.o. right-handed female with history significant for HIV 2015 hepatitis C chronic bronchitis IBS chronic tobacco polysubstance abuse anxiety/bipolar disorder.  Per chart review lives with sister.  Ambulated independently prior to admission.  Presented 09/17/2020 with acute onset of dizziness left facial droop slurred speech and left-sided weakness.  Cranial CT scan negative for acute changes.  CT angiogram head and neck no large vessel occlusion or significant stenosis.  Patient did not receive tPA.  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  Mild associated edema without mass-effect.  Admission chemistries unremarkable urine drug screen positive benzos as well as marijuana.  Most recent CD4 count June 22 noted to be 869 on most recent HIV RNA viral load to be 28 on November 2021.  Echocardiogram with ejection fraction of 55 to 60% no wall motion abnormalities.  Currently maintained on aspirin and Plavix for CVA prophylaxis x3 weeks and aspirin alone.  Plan for 30-day cardiac event monitor.  Patient was initially on Genvoya for HIV which impaired conversion of Plavix to active form thus discontinued started on Biktarvy.  Tolerating a regular diet.   Admit date: 09/21/2020 Discharge date: 10/04/2020  HPI Patient is now off Plavix she could potentially return to [facility] and OT initial evals  Doing well except tires easily, goes to bed earlier than she used to  Mod I dressing and bathing Complex ADLs - sister does cooking and shopping Pt does dusting an dvacuuming but is slower than usual  Walking tolerance is 10 minutes she climbs steps.  She is now using a cane rather than a walker.  She has a 1 level home.  No abd pain, since she was "cleaned out in hospital", discussed stopping carafate , discussed using bentyl only prn  Dr Tarri Glenn GI Dr Johnnye Sima ID PCP- Dr  Raymondo Band Pain Inventory Average Pain 10 Pain Right Now 9 My pain is sharp, burning, stabbing, tingling, and aching  LOCATION OF PAIN  wrist, hand, fingers, back, hip, toes  BOWEL Number of stools per week: 7 Oral laxative use Yes  Type of laxative Linzess Enema or suppository use No  History of colostomy No  Incontinent No   BLADDER Normal In and out cath, frequency na Able to self cath  na Bladder incontinence No  Frequent urination No  Leakage with coughing Yes  Difficulty starting stream No  Incomplete bladder emptying No    Mobility use a cane use a walker how many minutes can you walk? 10 ability to climb steps?  yes do you drive?  no  Function disabled: date disabled .  Neuro/Psych weakness numbness spasms dizziness confusion depression anxiety  Prior Studies Hospital f/u  Physicians involved in your care Hospital f/u   Family History  Problem Relation Age of Onset   Psychiatric Illness Mother    Hepatitis Father    Alcohol abuse Father    Breast cancer Paternal Aunt    Colon cancer Neg Hx    Stomach cancer Neg Hx    Social History   Socioeconomic History   Marital status: Single    Spouse name: Not on file   Number of children: Not on file   Years of education: Not on file   Highest education level: Not on file  Occupational History   Not on file  Tobacco Use   Smoking status:  Every Day    Packs/day: 0.50    Years: 48.00    Pack years: 24.00    Types: Cigarettes   Smokeless tobacco: Never  Vaping Use   Vaping Use: Never used  Substance and Sexual Activity   Alcohol use: Not Currently   Drug use: Not Currently    Types: "Crack" cocaine, IV    Comment: former   Sexual activity: Not Currently    Partners: Female, Female  Other Topics Concern   Not on file  Social History Narrative   ** Merged History Encounter **       Social Determinants of Health   Financial Resource Strain: Not on file  Food Insecurity: Not on file   Transportation Needs: Not on file  Physical Activity: Not on file  Stress: Not on file  Social Connections: Not on file   Past Surgical History:  Procedure Laterality Date   BIOPSY  06/18/2020   Procedure: BIOPSY;  Surgeon: [name], MD;  Location: [facility];  Service: Gastroenterology;;   BREAST EXCISIONAL BIOPSY Left 1987   benign   ESOPHAGOGASTRODUODENOSCOPY (EGD) WITH PROPOFOL N/A 06/18/2020   Procedure: ESOPHAGOGASTRODUODENOSCOPY (EGD) WITH PROPOFOL;  Surgeon: [name], MD;  Location: [facility];  Service: Gastroenterology;  Laterality: N/A;   I & D EXTREMITY Right 06/16/2020   Procedure: Open debridement of skin subcutaneous tissue and bone associated with open grade 2 fracture right hand;Radiographs 3 views right hand Complex wound closure degloving injury dorsal aspect of the hand greater than 10 cm. Open reduction and internal fixation right small finger metacarpal shaft ;  Surgeon: [name], MD;  Location: [place];  Service: Orthopedics;  Laterality: Right;   ORIF WRIST FRACTURE Left 06/16/2020   Procedure: Open reduction internal fixation displaced intra-articular distal radius fracture left wrist 3 more fragments; Radiographs 3 views left wrist Left wrist brachioadialis tendon release, tendon tenotomy;  Surgeon: [name], MD;  Location: [facility] [place];  Service: Orthopedics;  Laterality: Left;   SP ARTHRO WRIST*R*     WRIST SURGERY     Past Medical History:  Diagnosis Date   Anxiety    Bipolar 1 disorder ([place])    Chronic bronchitis ([place])    Hepatitis C carrier ([place])    HIV (human immunodeficiency virus infection) ([place]) 2015   HIV (human immunodeficiency virus infection) ([place])    Psychiatric disorder    reported h/o schizoaffective, bipolar, anxiety   Schizoaffective disorder ([facility])    BP 103/75   Pulse 70   Temp 98.4 F (36.9 C) (Oral)   Ht 5\' 5"  (1.651 m)   Wt 194 lb 6.4 oz (88.2 kg)   SpO2 97%   BMI 32.35 kg/m   Opioid Risk Score:   Fall  Risk Score:  `1  Depression screen PHQ 2/9  Depression screen [facility] 2/9 10/31/2020 07/11/2020 06/29/2020 01/14/2020 01/07/2020 12/15/2019 07/31/2019  Decreased Interest 3 0 1 3 3 1  0  Down, Depressed, Hopeless 2 0 1 1 2  0 2  PHQ - 2 Score 5 0 2 4 5 1 2   Altered sleeping 1 - 0 1 2 0 2  Tired, decreased energy 3 - 2 1 2 1 1   Change in appetite 1 - 1 1 2 2 1   Feeling bad or failure about yourself  2 - 0 1 2 0 1  Trouble concentrating 3 - 0 3 2 0 1  Moving slowly or fidgety/restless 1 - 1 - 2 1 1   Suicidal thoughts 0 - 0 1 1  0 2  PHQ-9 Score 16 - 6 12 18 5 11   Difficult doing work/chores Somewhat difficult - Somewhat difficult Somewhat difficult Very difficult Somewhat difficult -  Some encounter information is confidential and restricted. Go to Review Flowsheets activity to see all data.     Review of Systems  Musculoskeletal:  Positive for back pain.       Wrist, hand, fingers, left hip, toe pain spasms  Neurological:  Positive for dizziness, weakness and numbness.  Psychiatric/Behavioral:  Positive for confusion and dysphoric mood. The patient is nervous/anxious.   All other systems reviewed and are negative.     Objective:   Physical Exam Vitals and nursing note reviewed.  Constitutional:      Appearance: She is obese.  HENT:     Head: Normocephalic and atraumatic.  Eyes:     Extraocular Movements: Extraocular movements intact.     Conjunctiva/sclera: Conjunctivae normal.     Pupils: Pupils are equal, round, and reactive to light.  Cardiovascular:     Rate and Rhythm: Normal rate and regular rhythm.  Pulmonary:     Effort: Pulmonary effort is normal.     Breath sounds: Normal breath sounds.  Abdominal:     General: Abdomen is flat. Bowel sounds are normal. There is no distension.     Palpations: Abdomen is soft.  Musculoskeletal:        General: Normal range of motion.     Comments:     Skin:    General: Skin is warm and dry.  Neurological:     Mental Status: She is  alert and oriented to person, place, and time.     Cranial Nerves: Dysarthria present.     Motor: No weakness or tremor.     Coordination: Romberg sign negative.     Gait: Gait abnormal.   5/5 in BUE and BLE       Assessment & Plan:   1.  History of right pontine and right cerebellar infarcts with no significant residual other than reduced balance.  She should complete her home health PT OT. The patient is now of Plavix so she could potentially resume her Genvoya, will message ID  I think she is back to an independent functional level so I do not think she needs physical medicine rehab follow-up. 2.  History of esophageal ulceration associated with large hiatal hernia.  She is also had problems with abdominal pain which improved after receiving laxatives during her inpatient stay. Will discontinue Carafate Continue Protonix Follow-up with gastroenterology, discussed need for continued Linzess as well as Bentyl 3.  History of bipolar disorder continue high-dose Seroquel 300 mg nightly and follow-up with behavioral health  Neuro 4.  Secondary stroke prevention follow-up with neurology as well as primary care continue atorvastatin as well as aspirin.

## 2020-11-01 ENCOUNTER — Encounter: Payer: Medicaid Other | Admitting: Gastroenterology

## 2020-11-01 ENCOUNTER — Other Ambulatory Visit: Payer: Self-pay | Admitting: Physician Assistant

## 2020-11-01 ENCOUNTER — Other Ambulatory Visit (HOSPITAL_COMMUNITY): Payer: Self-pay

## 2020-11-02 ENCOUNTER — Other Ambulatory Visit (HOSPITAL_COMMUNITY): Payer: Self-pay

## 2020-11-02 ENCOUNTER — Other Ambulatory Visit: Payer: Self-pay

## 2020-11-02 ENCOUNTER — Other Ambulatory Visit (HOSPITAL_COMMUNITY): Payer: Self-pay | Admitting: Psychiatry

## 2020-11-02 MED ORDER — ALPRAZOLAM 0.5 MG PO TABS
0.5000 mg | ORAL_TABLET | Freq: Three times a day (TID) | ORAL | 0 refills | Status: DC | PRN
  Filled 2020-11-02: qty 30, 10d supply, fill #0

## 2020-11-07 ENCOUNTER — Ambulatory Visit: Payer: Medicaid Other | Admitting: Internal Medicine

## 2020-11-07 ENCOUNTER — Encounter: Payer: Self-pay | Admitting: Internal Medicine

## 2020-11-07 ENCOUNTER — Other Ambulatory Visit: Payer: Self-pay

## 2020-11-07 ENCOUNTER — Other Ambulatory Visit (HOSPITAL_COMMUNITY): Payer: Self-pay

## 2020-11-07 DIAGNOSIS — B20 Human immunodeficiency virus [HIV] disease: Secondary | ICD-10-CM

## 2020-11-07 DIAGNOSIS — S62102S Fracture of unspecified carpal bone, left wrist, sequela: Secondary | ICD-10-CM | POA: Diagnosis not present

## 2020-11-07 DIAGNOSIS — I6350 Cerebral infarction due to unspecified occlusion or stenosis of unspecified cerebral artery: Secondary | ICD-10-CM

## 2020-11-07 DIAGNOSIS — K219 Gastro-esophageal reflux disease without esophagitis: Secondary | ICD-10-CM | POA: Diagnosis not present

## 2020-11-07 DIAGNOSIS — R030 Elevated blood-pressure reading, without diagnosis of hypertension: Secondary | ICD-10-CM | POA: Insufficient documentation

## 2020-11-07 MED ORDER — BISACODYL 5 MG PO TBEC: 5.0000 mg | DELAYED_RELEASE_TABLET | Freq: Every day | ORAL | 0 refills | Status: DC | PRN

## 2020-11-07 MED ORDER — ATORVASTATIN CALCIUM 80 MG PO TABS
80.0000 mg | ORAL_TABLET | Freq: Every day | ORAL | 0 refills | Status: DC
  Filled 2020-11-07 (×2): qty 90, 90d supply, fill #0

## 2020-11-07 MED ORDER — PANTOPRAZOLE SODIUM 40 MG PO TBEC
40.0000 mg | DELAYED_RELEASE_TABLET | Freq: Every day | ORAL | 0 refills | Status: DC
  Filled 2020-11-07: qty 90, 90d supply, fill #0

## 2020-11-07 MED ORDER — PANTOPRAZOLE SODIUM 40 MG PO TBEC: 40.0000 mg | DELAYED_RELEASE_TABLET | Freq: Every day | ORAL | 0 refills | Status: DC

## 2020-11-07 MED ORDER — LINACLOTIDE 145 MCG PO CAPS
145.0000 ug | ORAL_CAPSULE | Freq: Every day | ORAL | 0 refills | Status: DC
  Filled 2020-11-07: qty 30, 30d supply, fill #0

## 2020-11-07 MED ORDER — ATORVASTATIN CALCIUM 80 MG PO TABS: 80.0000 mg | ORAL_TABLET | Freq: Every day | ORAL | 0 refills | Status: DC

## 2020-11-07 MED ORDER — LINACLOTIDE 145 MCG PO CAPS: 145.0000 ug | ORAL_CAPSULE | Freq: Every day | ORAL | 0 refills | Status: DC

## 2020-11-07 MED ORDER — BISACODYL 5 MG PO TBEC
5.0000 mg | DELAYED_RELEASE_TABLET | Freq: Every day | ORAL | 0 refills | Status: DC | PRN
Start: 2020-11-07 — End: 2020-11-10
  Filled 2020-11-07: qty 30, 30d supply, fill #0

## 2020-11-07 NOTE — Assessment & Plan Note (Signed)
>>  ASSESSMENT AND PLAN FOR GERD (GASTROESOPHAGEAL REFLUX DISEASE) WRITTEN ON 11/07/2020  5:18 PM BY SELENE HENDERSON, MD  Patient reports that she was undergoing evaluation for IBS-like symptoms and grade D esophagitis prior to her stroke.  Patient previously reported left-sided abdominal pain that had improved with defecation.  CT findings were nonspecific.  Patient was recommended for colonoscopy.  She was also recommended for endoscopy to follow-up on her esophagitis monitor for sponsor to PPI.  However, in setting of her stroke and dual antiplatelet therapy, patient has not been able to follow-up on this.  She would like to proceed with endoscopy and colonoscopy to evaluate this further.  Plan Refilled Linzess , Protonix , Dulcolax Patient to schedule appointment with GI for endoscopy and colonoscopy

## 2020-11-07 NOTE — Assessment & Plan Note (Signed)
BP Readings from Last 3 Encounters:  11/07/20 (!) 139/99  10/31/20 103/75  10/04/20 (!) 127/95   Patient noted to have elevated blood pressure reading of 139/99 on presentation today.  She does not have a history of hypertension with prior readings being normal.  In setting of recent stroke, patient would benefit from improved blood pressure control.  Patient recommended to monitor blood pressure at home and follow-up in 4 weeks.  Plan Patient advised to keep BP log at home Follow-up in 4 weeks for BP check Goal BP <120/80

## 2020-11-07 NOTE — Progress Notes (Signed)
   CC: stroke follow up, clearance for wrist surgery  HPI:  [name] is a 60 y.o. female with PMHx as stated below presenting for follow up of her recent stroke two months prior. She is also requesting clearance for wrist surgery. No acute concerns at this time. Please see problem based charting for complete assessment and plan.   Past Medical History:  Diagnosis Date   Anxiety    Bipolar 1 disorder ([place])    Chronic bronchitis ([place])    Hepatitis C carrier ([place])    HIV (human immunodeficiency virus infection) ([place]) 2015   HIV (human immunodeficiency virus infection) ([place])    Psychiatric disorder    reported h/o schizoaffective, bipolar, anxiety   Schizoaffective disorder ([place])    Review of Systems:  Negative except as stated in HPI.  Physical Exam:  Vitals:   11/07/20 0915  BP: (!) 139/99  Pulse: 77  Resp: (!) 24  Temp: 98.3 F (36.8 C)  TempSrc: Oral  SpO2: 97%  Weight: 192 lb 12.8 oz (87.5 kg)  Height: 5\' 5"  (1.651 m)   Physical Exam  Constitutional: Appears well-developed and well-nourished. No distress.  HENT: Normocephalic and atraumatic, EOMI, conjunctiva normal, moist mucous membranes Cardiovascular: Normal rate, regular rhythm, S1 and S2 present, no murmurs, rubs, gallops.  Distal pulses intact Respiratory:  Lungs are clear to auscultation bilaterally. GI: Nondistended, soft, nontender to palpation, normal bowel sounds Musculoskeletal: Normal bulk and tone.  R wrist in splint; no peripheral edema noted Neurological: Is alert and oriented x4, strength 5/5 in bilat upper and lower extremities; no CN deficits noted; gait instability for which uses cane Skin: Warm and dry.  No rash, erythema, lesions noted. Psychiatric: Normal mood and affect. Behavior is normal. Judgment and thought content normal.    Assessment & Plan:   See Encounters Tab for problem based charting.  Patient discussed with Dr. Daryll Drown

## 2020-11-07 NOTE — Assessment & Plan Note (Addendum)
Patient with history of HIV, well-controlled on Genvoya.  Recently switched to [facility] in setting of Plavix use.  Most recent HIV viral load undetectable with CD4 count 749.  Plan Follow-up with ID regarding continuing Biktarvy versus switching back to [facility] as she is no longer on Plavix

## 2020-11-07 NOTE — Assessment & Plan Note (Addendum)
Patient reports that she was undergoing evaluation for IBS-like symptoms and grade D esophagitis prior to her stroke.  Patient previously reported left-sided abdominal pain that had improved with defecation.  CT findings were nonspecific.  Patient was recommended for colonoscopy.  She was also recommended for endoscopy to follow-up on her esophagitis monitor for sponsor to PPI.  However, in setting of her stroke and dual antiplatelet therapy, patient has not been able to follow-up on this.  She would like to proceed with endoscopy and colonoscopy to evaluate this further.  Plan Refilled Linzess, Protonix, Dulcolax Patient to schedule appointment with GI for endoscopy and colonoscopy

## 2020-11-07 NOTE — Assessment & Plan Note (Addendum)
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 She was discharged with home health physical therapy. Reports her weakness is improved but continues to have ambulation issues for which she uses a cane. On exam, strength 5/5 in bilateral upper and lower extremities without any cranial nerve deficits appreciated.   Patient completed 3 weeks of dual antiplatelet therapy and is currently on aspirin 81mg  daily. She is also on atorvastatin 80mg  daily.  Plan: Continue aspirin 81mg  daily and atorvastatin 80mg  daily Lipid panel at next visit Continue physical therapy

## 2020-11-07 NOTE — Assessment & Plan Note (Signed)
>>  ASSESSMENT AND PLAN FOR HIV DISEASE WRITTEN ON 11/07/2020  5:12 PM BY Harvie Heck, MD  Patient with history of HIV, well-controlled on Genvoya.  Recently switched to [facility] in setting of Plavix use.  Most recent HIV viral load undetectable with CD4 count 749.  Plan Follow-up with ID regarding continuing Biktarvy versus switching back to [facility] as she is no longer on Plavix

## 2020-11-07 NOTE — Patient Instructions (Addendum)
Ms Mackenzie Gonzalez,  It was a pleasure seeing you in clinic. Today we discussed:   Elevated blood pressure: Please keep a log of your blood pressures at home every day. Please bring to your next appointment.   You may schedule your endoscopy and colonoscopy with the GI doctors at your earliest convenience.  I will discuss with the orthopedic surgeon regarding recommendation for surgery  If you have any questions or concerns, please call our clinic at [phone] between 9am-5pm and after hours call [phone] and ask for the internal medicine resident on call. If you feel you are having a medical emergency please call 911.   Thank you, we look forward to helping you remain healthy!
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
## 2020-11-08 NOTE — Progress Notes (Signed)
Cardiology Office Note:    Date:  11/08/2020   ID:  Mackenzie [facility], DOB Oct 16, 1960, MRN [id number]  PCP:  Dorethea Clan, DO   [place] Providers Cardiologist:  None     Referring MD: Dorethea Clan, DO   No chief complaint on file. CVA  History of Present Illness:    Mackenzie Gonzalez is a 60 y.o. female with a hx of CVA (right pontine & right cerebellar in August HIV (CD4 800s July 2022/ viral load low 28 11/2019),substance smoker, schizoaffective, referred to cardiology for work up of stroke  In August, she had acute left sided weakness. Her Echo showed EF 55-60%, RV fxn is normal, no valvular disease, agitated saline bubble study was negative. MRI brain showed acute infarcts right pons and right cerebellum. Also, mild to moderate chronic microvascular ischemic disease.  CTA head and neck showed no occlusion. Only minimal carotid plaque BL. Rhythm strips showed sinus rhythm. Cardiac event monitor was ordered, she mailed it in a few days ago.   She is doing ok since her stroke.She is still weak on the left side. She's been taking her medications. She is taking her blood pressure each 136/103 mmHg. She says she gets flutters some times. She denies syncope. She does report any other symptoms.  Past Medical History:  Diagnosis Date   Anxiety    Bipolar 1 disorder ([place])    Chronic bronchitis ([place])    Hepatitis C carrier ([facility])    HIV (human immunodeficiency virus infection) ([place]) 2015   HIV (human immunodeficiency virus infection) ([place])    Psychiatric disorder    reported h/o schizoaffective, bipolar, anxiety   Schizoaffective disorder ([facility])     Past Surgical History:  Procedure Laterality Date   BIOPSY  06/18/2020   Procedure: BIOPSY;  Surgeon: [name], MD;  Location: [facility];  Service: Gastroenterology;;   BREAST EXCISIONAL BIOPSY Left 1987   benign   ESOPHAGOGASTRODUODENOSCOPY (EGD) WITH PROPOFOL N/A 06/18/2020   Procedure: ESOPHAGOGASTRODUODENOSCOPY  (EGD) WITH PROPOFOL;  Surgeon: [name], MD;  Location: [facility];  Service: Gastroenterology;  Laterality: N/A;   I & D EXTREMITY Right 06/16/2020   Procedure: Open debridement of skin subcutaneous tissue and bone associated with open grade 2 fracture right hand;Radiographs 3 views right hand Complex wound closure degloving injury dorsal aspect of the hand greater than 10 cm. Open reduction and internal fixation right small finger metacarpal shaft ;  Surgeon: [name], MD;  Location: [facility];  Service: Orthopedics;  Laterality: Right;   ORIF WRIST FRACTURE Left 06/16/2020   Procedure: Open reduction internal fixation displaced intra-articular distal radius fracture left wrist 3 more fragments; Radiographs 3 views left wrist Left wrist brachioadialis tendon release, tendon tenotomy;  Surgeon: [name], MD;  Location: [facility];  Service: Orthopedics;  Laterality: Left;   SP ARTHRO WRIST*R*     WRIST SURGERY      Current Medications: No outpatient medications have been marked as taking for the 11/10/20 encounter (Appointment) with Janina Mayo, MD.     Allergies:   Haldol [haloperidol]   Social History   Socioeconomic History   Marital status: Single    Spouse name: Not on file   Number of children: Not on file   Years of education: Not on file   Highest education level: Not on file  Occupational History   Not on file  Tobacco Use   Smoking status: Every Day    Packs/day: 0.50    Years:  48.00    Pack years: 24.00    Types: Cigarettes   Smokeless tobacco: Never  Vaping Use   Vaping Use: Never used  Substance and Sexual Activity   Alcohol use: Not Currently   Drug use: Not Currently    Types: "Crack" cocaine, IV    Comment: former   Sexual activity: Not Currently    Partners: Female, Female  Other Topics Concern   Not on file  Social History Narrative   ** Merged History Encounter **       Social Determinants of Health   Financial Resource Strain: Not on  file  Food Insecurity: Not on file  Transportation Needs: Not on file  Physical Activity: Not on file  Stress: Not on file  Social Connections: Not on file     Family History: The patient's family history includes Alcohol abuse in her father; Breast cancer in her paternal aunt; Hepatitis in her father; Psychiatric Illness in her mother. There is no history of Colon cancer or Stomach cancer.  ROS:   Please see the history of present illness.     All other systems reviewed and are negative.  EKGs/Labs/Other Studies Reviewed:    The following studies were reviewed today:   EKG:   09/20/2020-Sinus arrhythmia  Rhythm strips May to August all sinus rhythm  Recent Labs: 09/18/2020: Magnesium 2.1 09/22/2020: ALT 14; BUN 19; Creatinine, Ser 0.88; Hemoglobin 12.5; Platelets 169; Potassium 4.0; Sodium 139  Recent Lipid Panel    Component Value Date/Time   CHOL 232 (H) 09/18/2020 0222   TRIG 161 (H) 09/18/2020 0222   HDL 36 (L) 09/18/2020 0222   CHOLHDL 6.4 09/18/2020 0222   VLDL 32 09/18/2020 0222   LDLCALC 164 (H) 09/18/2020 0222   LDLCALC 100 (H) 04/20/2019 1459     Risk Assessment/Calculations:    Physical Exam:    VS:  There were no vitals taken for this visit.    Wt Readings from Last 3 Encounters:  11/07/20 192 lb 12.8 oz (87.5 kg)  10/31/20 194 lb 6.4 oz (88.2 kg)  09/21/20 182 lb 5.1 oz (82.7 kg)     GEN:  Well nourished, well developed in no acute distress. Diffuse Vitiligo. Obese HEENT: Normal NECK: No JVD; No carotid bruits LYMPHATICS: No lymphadenopathy CARDIAC: RRR, no murmurs, rubs, gallops RESPIRATORY:  Clear to auscultation without rales, wheezing or rhonchi  ABDOMEN: Soft, non-tender, non-distended MUSCULOSKELETAL:  No edema; No deformity  SKIN: Warm and dry NEUROLOGIC:  Alert and oriented x 3 PSYCHIATRIC:  Normal affect   ASSESSMENT:    #Stroke - pending ziopatch to assess for cardioembolic cause, Will read results and discuss via EHR. Echo showed  normal LVEF , no LV thrombus. Bubble study was negative. She has stage I hypertension, non lacunar stroke. She smokes and we discussed cessation. She has all sinus rhythm from strips and recent ECGs.   #HTN - Stage I HTN. Discussed checking ambulatory checking for a month until she sees her primary doctor. We discussed increasing her physical activity and diet. She can start chlorthalidone if Bps consistently > 130/80 mmHg despite   PLAN:    In order of problems listed above:  Pending ziopatch, FU PRN depending on results   Medication Adjustments/Labs and Tests Ordered: Current medicines are reviewed at length with the patient today.  Concerns regarding medicines are outlined above.    Signed, Janina Mayo, MD  11/08/2020 7:32 PM    [place]

## 2020-11-09 ENCOUNTER — Telehealth: Payer: Self-pay | Admitting: Gastroenterology

## 2020-11-09 NOTE — Telephone Encounter (Signed)
Patient is no longer on Plavix; just aspirin 81mg  daily. I believe it should be okay to proceed with EGD/colonoscopy with Dr Tarri Glenn at this time. Please let me know if any further questions or concerns.

## 2020-11-09 NOTE — Telephone Encounter (Signed)
Inbound call from pt requesting a call back stating that she has been cleared from her neuro Dr. For her procedure. Please advise. Thank you.

## 2020-11-09 NOTE — Telephone Encounter (Signed)
Clearance has not been received. Therefore cannot proceed at this time. Reviewed pt chart. Appears pt was seen by Dr. Marva Panda whom provided clearance for wrist surgery without mention of holding Plavix. A message has been routed to Dr. Marva Panda for her to review our clearance request and to advise further. Procedure scheduling remains pending at this time until we hear back from Dr. Marva Panda. Will continue efforts to follow up with Dr. Marva Panda.

## 2020-11-09 NOTE — Telephone Encounter (Signed)
Pt called today to advise our office that she is now cleared from a neurological standpoint and may proceed with scheduling her procedures. However, Dr. Tarri Glenn has not yet received clearance. Review of pt chart indicate pt was seen by Dr. Marva Panda and clearance was provided for wrist surgery. Did not see any mention about permission to hold Plavix as well. Routing this message to Dr. Marva Panda for her to review our request and to advise further. Will await her response BEFORE scheduling procedures.

## 2020-11-10 ENCOUNTER — Ambulatory Visit (INDEPENDENT_AMBULATORY_CARE_PROVIDER_SITE_OTHER): Payer: Medicaid Other | Admitting: Internal Medicine

## 2020-11-10 ENCOUNTER — Other Ambulatory Visit: Payer: Self-pay

## 2020-11-10 ENCOUNTER — Encounter: Payer: Self-pay | Admitting: Internal Medicine

## 2020-11-10 VITALS — BP 149/102 | HR 78 | Ht 65.0 in | Wt 191.0 lb

## 2020-11-10 DIAGNOSIS — K2101 Gastro-esophageal reflux disease with esophagitis, with bleeding: Secondary | ICD-10-CM

## 2020-11-10 DIAGNOSIS — I639 Cerebral infarction, unspecified: Secondary | ICD-10-CM | POA: Diagnosis not present

## 2020-11-10 DIAGNOSIS — K5900 Constipation, unspecified: Secondary | ICD-10-CM

## 2020-11-10 DIAGNOSIS — K208 Other esophagitis without bleeding: Secondary | ICD-10-CM

## 2020-11-10 DIAGNOSIS — R1030 Lower abdominal pain, unspecified: Secondary | ICD-10-CM

## 2020-11-10 NOTE — Telephone Encounter (Signed)
Called pt to reschedule procedure. Pt has been scheduled 01/10/21 @ 1:30pm, arrival 12:30pm @ [facility]. New amb referral placed. Prep instructions sent via Myt Chart per pt request.

## 2020-11-10 NOTE — Telephone Encounter (Signed)
Refer to clearance received below. Please advise if I am rescheduling at [facility] or [facility].

## 2020-11-10 NOTE — Patient Instructions (Signed)
Medication Instructions:  CONTINUE SAME MEDICATIONS. *If you need a refill on your cardiac medications before your next appointment, please call your pharmacy*  Follow-Up: At [facility], you and your health needs are our priority.  As part of our continuing mission to provide you with exceptional heart care, we have created designated Provider Care Teams.  These Care Teams include your primary Cardiologist (physician) and Advanced Practice Providers (APPs -  Physician Assistants and Nurse Practitioners) who all work together to provide you with the care you need, when you need it.  We recommend signing up for the patient portal called "MyChart".  Sign up information is provided on this After Visit Summary.  MyChart is used to connect with patients for Virtual Visits (Telemedicine).  Patients are able to view lab/test results, encounter notes, upcoming appointments, etc.  Non-urgent messages can be sent to your provider as well.   To learn more about what you can do with MyChart, go to [email or web address].    Your next appointment:  CALL AS NEEDED    Provider:   DR. Harl Bowie

## 2020-11-10 NOTE — Progress Notes (Signed)
Internal Medicine Clinic Attending ° °Case discussed with Dr. Aslam  At the time of the visit.  We reviewed the resident’s history and exam and pertinent patient test results.  I agree with the assessment, diagnosis, and plan of care documented in the resident’s note.  °

## 2020-11-13 ENCOUNTER — Other Ambulatory Visit (HOSPITAL_COMMUNITY): Payer: Self-pay | Admitting: Internal Medicine

## 2020-11-13 ENCOUNTER — Other Ambulatory Visit (HOSPITAL_COMMUNITY): Payer: Self-pay

## 2020-11-13 DIAGNOSIS — I4891 Unspecified atrial fibrillation: Secondary | ICD-10-CM

## 2020-11-13 DIAGNOSIS — I639 Cerebral infarction, unspecified: Secondary | ICD-10-CM

## 2020-11-14 ENCOUNTER — Telehealth: Payer: Self-pay | Admitting: *Deleted

## 2020-11-14 NOTE — Telephone Encounter (Signed)
Spoke with pt, aware of monitor results. 

## 2020-11-14 NOTE — Telephone Encounter (Signed)
-----   Message from Janina Mayo, MD sent at 11/14/2020 11:28 AM EDT ----- Regarding: results zio patch Hi [name],  Thank you for covering [facility]. Please let this patient know that her zio pach did not show any concerning rhythm problems. She has no atrial fibrillation. I had trouble linking to a result note.  Thank you!

## 2020-11-15 ENCOUNTER — Encounter: Payer: Self-pay | Admitting: Adult Health

## 2020-11-15 ENCOUNTER — Other Ambulatory Visit: Payer: Self-pay

## 2020-11-15 ENCOUNTER — Ambulatory Visit: Payer: Medicaid Other | Admitting: Adult Health

## 2020-11-15 VITALS — BP 128/94 | HR 79 | Ht 65.0 in | Wt 196.2 lb

## 2020-11-15 DIAGNOSIS — E785 Hyperlipidemia, unspecified: Secondary | ICD-10-CM

## 2020-11-15 DIAGNOSIS — R269 Unspecified abnormalities of gait and mobility: Secondary | ICD-10-CM

## 2020-11-15 DIAGNOSIS — I63531 Cerebral infarction due to unspecified occlusion or stenosis of right posterior cerebral artery: Secondary | ICD-10-CM

## 2020-11-15 DIAGNOSIS — F1910 Other psychoactive substance abuse, uncomplicated: Secondary | ICD-10-CM | POA: Diagnosis not present

## 2020-11-15 DIAGNOSIS — I69398 Other sequelae of cerebral infarction: Secondary | ICD-10-CM | POA: Diagnosis not present

## 2020-11-15 NOTE — Patient Instructions (Addendum)
Would highly recommend working with therapies for further stroke recovery - please let me know if you would like to start  Continue aspirin 81 mg daily  and atorvastatin 40 mg daily for secondary stroke prevention  Continue to follow up with PCP regarding cholesterol and blood pressure management/monitoring  Maintain strict control of hypertension with blood pressure goal below 130/90 and cholesterol with LDL cholesterol (bad cholesterol) goal below 70 mg/dL.   Highly recommend complete tobacco cessation as continued use greatly increased risk of additional strokes     ,  Followup in the future with me in 6 months or call earlier if needed       Thank you for coming to see [place] at [facility]. I hope we have been able to provide you high quality care today.  You may receive a patient satisfaction survey over the next few weeks. We would appreciate your feedback and comments so that we may continue to improve ourselves and the health of our patients.

## 2020-11-15 NOTE — Progress Notes (Signed)
[facility] [address] [place]. [place] [address] [phone]       HOSPITAL FOLLOW UP NOTE  [name] Date of Birth:  October 04, 1960 Medical Record Number:  [id number]   Reason for Referral:  hospital stroke follow up    SUBJECTIVE:   CHIEF COMPLAINT:  Chief Complaint  Patient presents with   Follow-up    Rm 2 alone here for hospital follow up. Pt reports she has been doing well since d/c fatigue has been noted.     HPI:   Mackenzie Gonzalez is a 60 year old female with history of bipolar, hepatitis C, HIV, smoker, substance abuse who presented on 09/17/2020 for dizziness, vertigo, left facial droop and left-sided weakness.  CT no acute finding.  CT head and neck no LVO but small posterior circulation including VAs and BA with bilateral PCOMs.  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  EF 55 to 60%, TCD bubble study negative for PFO.  A1c 5.5, LDL 164, UDS positive for THC.  B12 146 in 05/2020, RPR negative in 06/2020.  History of HIV, on HARRT therapy, last viral load and CD4 in 06/2020 showed no viral load detected and CD4 869.  Etiology for stroke likely small/large vessel disease in setting of hypoplastic posterior circulation and uncontrolled risk factors including tobacco use, substance abuse, HLD and HTN.  No evidence of CNS infection or infectious vasculitis at that time.  However given 2 different locations Cardiologic source cannot be completely ruled out and recommended 30-day cardiac event monitor to rule out atrial fibrillation.  Recommended DAPT for 3 weeks and aspirin alone as well as starting atorvastatin 80 mg daily.  Patient on Genvoya PTA for HIV but discontinued due to interaction with Plavix and started on Biktarvy.  No prior stroke history.  Therapy eval's recommended CIR for residual dysarthria and left-sided weakness.  Today, 11/15/2020, being seen for hospital follow-up unaccompanied.  Overall doing well.  Does c/o residual  imbalance, fatigue, occasional swallowing usually with thin liquids and occasional with chewier foods and "slow in my thinking". Usually uses a cane for long distance - not usually needed for short distance. Eval by [facility] PT/OT but frustrated as they took a while to come out to see her and "only talked" and didn't do any actual therapy. She wishes to wait to do any additional therapy until she gets her wrist fixed (hx of radial fracture and R fifth metacarpal fracture with degloving injury s/p ORIF 06/16/2020 with revision surgery on hold in setting of recent stroke). Lives with sister - was living with her prior. Able to maintain ADLs and majority of IADLs. Denies new stroke/TIA symptoms.  Completed 3 weeks DAPT -remains on aspirin alone as well as atorvastatin without side effects.  Blood pressure today 128/94 not currently on medication management. Monitors at home - thinks machine is not working correctly as it has been high at home. Cardiac monitor negative for atrial fibrillation. Able to decrease tobacco use but able to decrease amount. Sister also smokes at home which has made quitting difficulty. No further concerns at this time.      PERTINENT IMAGING  MR BRAIN WO CONTRAST 09/18/2020 IMPRESSION: 1. Acute infarcts in the right pons and right cerebellum. Mild associated edema without mass effect. 2. Mild to moderate chronic microvascular ischemic disease.  CTA HEAD/NECK 09/17/2020 IMPRESSION: No large vessel occlusion, hemodynamically significant stenosis, or evidence of dissection.  TCD 09/18/2020 Summary:  No HITS at rest or during Valsalva. Negative  transcranial Doppler Bubble  study with no evidence of right to left intracardiac communication.   2D ECHO 09/18/2020 IMPRESSIONS   1. Left ventricular ejection fraction, by estimation, is 55 to 60%. The  left ventricle has normal function. The left ventricle has no regional  wall motion abnormalities. There is mild left ventricular  hypertrophy.  Left ventricular diastolic parameters  are indeterminate.   2. Right ventricular systolic function is normal. The right ventricular  size is normal. There is normal pulmonary artery systolic pressure. The  estimated right ventricular systolic pressure is 06.3 mmHg.   3. The mitral valve is normal in structure. No evidence of mitral valve  regurgitation. No evidence of mitral stenosis.   4. The aortic valve was not well visualized. Aortic valve regurgitation  is not visualized. No aortic stenosis is present.   5. The inferior vena cava is normal in size with greater than 50%  respiratory variability, suggesting right atrial pressure of 3 mmHg.   6. Agitated saline contrast bubble study was negative, with no evidence  of any interatrial shunt. Technically difficult bubble study, but no  shunting seen        ROS:   14 system review of systems performed and negative with exception of those listed in HPI  PMH:  Past Medical History:  Diagnosis Date   Anxiety    Bipolar 1 disorder ([place])    Chronic bronchitis ([place])    Hepatitis C carrier ([place])    HIV (human immunodeficiency virus infection) ([place]) 2015   HIV (human immunodeficiency virus infection) ([place])    Psychiatric disorder    reported h/o schizoaffective, bipolar, anxiety   Schizoaffective disorder ([place])     PSH:  Past Surgical History:  Procedure Laterality Date   BIOPSY  06/18/2020   Procedure: BIOPSY;  Surgeon: [name], MD;  Location: [place];  Service: Gastroenterology;;   BREAST EXCISIONAL BIOPSY Left 1987   benign   ESOPHAGOGASTRODUODENOSCOPY (EGD) WITH PROPOFOL N/A 06/18/2020   Procedure: ESOPHAGOGASTRODUODENOSCOPY (EGD) WITH PROPOFOL;  Surgeon: [name], MD;  Location: [place];  Service: Gastroenterology;  Laterality: N/A;   I & D EXTREMITY Right 06/16/2020   Procedure: Open debridement of skin subcutaneous tissue and bone associated with open grade 2 fracture right  hand;Radiographs 3 views right hand Complex wound closure degloving injury dorsal aspect of the hand greater than 10 cm. Open reduction and internal fixation right small finger metacarpal shaft ;  Surgeon: [name], MD;  Location: [facility];  Service: Orthopedics;  Laterality: Right;   ORIF WRIST FRACTURE Left 06/16/2020   Procedure: Open reduction internal fixation displaced intra-articular distal radius fracture left wrist 3 more fragments; Radiographs 3 views left wrist Left wrist brachioadialis tendon release, tendon tenotomy;  Surgeon: [name], MD;  Location: [facility];  Service: Orthopedics;  Laterality: Left;   SP ARTHRO WRIST*R*     WRIST SURGERY      Social History:  Social History   Socioeconomic History   Marital status: Single    Spouse name: Not on file   Number of children: Not on file   Years of education: Not on file   Highest education level: Not on file  Occupational History   Not on file  Tobacco Use   Smoking status: Every Day    Packs/day: 0.50    Years: 48.00    Pack years: 24.00    Types: Cigarettes   Smokeless tobacco: Never  Vaping Use   Vaping Use: Never used  Substance  and Sexual Activity   Alcohol use: Not Currently   Drug use: Not Currently    Types: "Crack" cocaine, IV    Comment: former   Sexual activity: Not Currently    Partners: Female, Female  Other Topics Concern   Not on file  Social History Narrative   ** Merged History Encounter **       Social Determinants of Health   Financial Resource Strain: Not on file  Food Insecurity: Not on file  Transportation Needs: Not on file  Physical Activity: Not on file  Stress: Not on file  Social Connections: Not on file  Intimate Partner Violence: Not on file    Family History:  Family History  Problem Relation Age of Onset   Psychiatric Illness Mother    Hepatitis Father    Alcohol abuse Father    Breast cancer Paternal Aunt    Colon cancer Neg Hx    Stomach cancer Neg Hx      Medications:   Current Outpatient Medications on File Prior to Visit  Medication Sig Dispense Refill   albuterol (VENTOLIN HFA) 108 (90 Base) MCG/ACT inhaler Inhale 2 puffs into the lungs every 6 (six) hours as needed for wheezing or shortness of breath. 8.5 g 6   ALPRAZolam (XANAX) 0.5 MG tablet Take 1 tablet (0.5 mg total) by mouth 3 (three) times daily as needed for anxiety. 30 tablet 0   aspirin 81 MG chewable tablet Chew 1 tablet (81 mg total) by mouth daily.     atorvastatin (LIPITOR) 80 MG tablet Take 1 tablet (80 mg total) by mouth daily. 90 tablet 0   bictegravir-emtricitabine-tenofovir AF (BIKTARVY) 50-200-25 MG TABS tablet Take 1 tablet by mouth daily. 90 tablet 0   citalopram (CELEXA) 20 MG tablet Take 1 tablet (20 mg total) by mouth daily. 30 tablet 3   cyanocobalamin 1000 MCG tablet Take 1 tablet (1,000 mcg total) by mouth daily. 30 tablet 0   linaclotide (LINZESS) 145 MCG CAPS capsule Take 1 capsule (145 mcg total) by mouth daily before breakfast. 30 capsule 0   pantoprazole (PROTONIX) 40 MG tablet Take 1 tablet (40 mg total) by mouth daily. 90 tablet 0   polyethylene glycol (MIRALAX / GLYCOLAX) 17 g packet Take 17 g by mouth 2 (two) times daily as needed for mild constipation. 14 each 0   QUEtiapine (SEROQUEL) 300 MG tablet Take 1 tablet (300 mg total) by mouth at bedtime. 30 tablet 3   senna-docusate (SENOKOT-S) 8.6-50 MG tablet Take 1 tablet by mouth 2 (two) times daily.     sucralfate (CARAFATE) 1 g tablet Take 1 tablet (1 g total) by mouth 4 (four) times daily -  with meals and at bedtime. 90 tablet 0   valACYclovir (VALTREX) 1000 MG tablet Take 1,000 mg by mouth See admin instructions. Take 1 tablet by mouth three times daily ONLY when having an active break out     No current facility-administered medications on file prior to visit.    Allergies:   Allergies  Allergen Reactions   Haldol [Haloperidol] Other (See Comments)    Makes jittery       OBJECTIVE:  Physical Exam  Vitals:   11/15/20 0752  BP: (!) 128/94  Pulse: 79  SpO2: 97%  Weight: 196 lb 4 oz (89 kg)  Height: 5\' 5"  (1.651 m)   Body mass index is 32.66 kg/m. No results found.  General: well developed, well nourished, very pleasant middle-age Caucasian female, seated, in no evident  distress Head: head normocephalic and atraumatic.   Neck: supple with no carotid or supraclavicular bruits Cardiovascular: regular rate and rhythm, no murmurs Musculoskeletal: no deformity Skin:  no rash/petichiae Vascular:  Normal pulses all extremities   Neurologic Exam Mental Status: Awake and fully alert.  Mild dysarthria.  No evidence of aphasia.  Oriented to place and time. Recent memory subjectively impaired and remote memory intact. Attention span, concentration and fund of knowledge appropriate during visit. Mood and affect appropriate.  Cranial Nerves: Fundoscopic exam reveals sharp disc margins. Pupils equal, briskly reactive to light. Extraocular movements full without nystagmus. Visual fields full to confrontation. Hearing intact. Facial sensation intact. Face, tongue, palate moves normally and symmetrically.  Motor: Normal bulk and tone. Normal strength in all tested extremity muscles except possible slight left upper and lower extremity weakness although giveaway weakness noted Sensory.: intact to touch , pinprick , position and vibratory sensation.  Coordination: Rapid alternating movements normal in all extremities. Finger-to-nose and heel-to-shin performed accurately bilaterally. Gait and Station: Arises from chair without difficulty. Stance is normal. Gait abnormality with slight favoring of left leg mild imbalance not currently using assistive device.  Unable to perform tandem walk and heel toe.  Reflexes: 1+ and symmetric. Toes downgoing.     NIHSS  1 Modified Rankin  2      ASSESSMENT: Mackenzie Gonzalez is a 60 y.o. year old female with right pons and  right cerebellum infarct secondary to small vessel disease source although cardioembolic cannot be completely ruled out given 2 separate locations. Vascular risk factors include HTN, HLD, polysubstance use (tobacco, THC and EtOH), obesity, hep C and HIV on HARRT therapy.      PLAN:  Right pons and cerebellar stroke:  Residual deficit: Subjective mild left-sided weakness, gait impairment with imbalance, mild dysarthria, mild cognitive impairment and subjective dysphagia.  Discussed working with outpatient therapies - she wishes to hold off until further eval of right wrist pain -she was advised to call office if interested in pursuing in the future.  Discussed swallowing tips to prevent aspiration. Cardiac monitor negative for atrial fibrillation Continue aspirin 81 mg daily  and atorvastatin 40 mg daily for secondary stroke prevention.   Discussed secondary stroke prevention measures and importance of close PCP follow up for aggressive stroke risk factor management. I have gone over the pathophysiology of stroke, warning signs and symptoms, risk factors and their management in some detail with instructions to go to the closest emergency room for symptoms of concern. HTN: BP goal <130/90.  Stable on nonpharmacological management monitored by PCP HLD: LDL goal <70. Recent LDL 164.  Continue atorvastatin 40 mg daily.  Request follow-up with PCP in the next 1 to 2 months for repeat Polysubstance abuse: Discussed importance of complete cessation as continued use greatly increases additional strokes.    Follow up in 6 months or call earlier if needed   CC:  [facility] provider: Dr. Leonie Man PCP: Dorethea Clan, DO    I spent 56 minutes of face-to-face and non-face-to-face time with patient.  This included previsit chart review including review of recent hospitalization, lab review, study review, electronic health record documentation, patient education regarding recent stroke including etiology, secondary  stroke prevention measures and importance of managing stroke risk factors, residual deficits and typical recovery time and answered all other questions to patient satisfaction  Frann Rider, [facility]  [facility] [address] [place], [place] [address]  Phone [phone] Fax [phone] Note: This document was prepared  with digital dictation and possible smart phrase technology. Any transcriptional errors that result from this process are unintentional.

## 2020-11-16 NOTE — Progress Notes (Signed)
I agree with the above plan 

## 2020-11-22 ENCOUNTER — Other Ambulatory Visit (HOSPITAL_COMMUNITY): Payer: Self-pay

## 2020-11-22 ENCOUNTER — Other Ambulatory Visit (HOSPITAL_COMMUNITY): Payer: Self-pay | Admitting: Psychiatry

## 2020-11-23 ENCOUNTER — Other Ambulatory Visit (HOSPITAL_COMMUNITY): Payer: Self-pay

## 2020-11-23 MED ORDER — ALPRAZOLAM 0.5 MG PO TABS
0.5000 mg | ORAL_TABLET | Freq: Three times a day (TID) | ORAL | 0 refills | Status: DC | PRN
Start: 2020-11-23 — End: 2020-12-16
  Filled 2020-11-23: qty 30, 10d supply, fill #0

## 2020-12-05 ENCOUNTER — Encounter: Payer: Medicaid Other | Admitting: Internal Medicine

## 2020-12-05 ENCOUNTER — Telehealth: Payer: Self-pay | Admitting: *Deleted

## 2020-12-05 NOTE — Telephone Encounter (Signed)
CALLED PATIENT LEFT VOICE MESSAGE REGARDING HER MISSED APPOINTMENT TODAY. PATIENT TO CALL THE OFFICE TO RESCHEDULE AT [phone].

## 2020-12-12 ENCOUNTER — Other Ambulatory Visit: Payer: Self-pay | Admitting: Physician Assistant

## 2020-12-12 ENCOUNTER — Other Ambulatory Visit (HOSPITAL_COMMUNITY): Payer: Self-pay

## 2020-12-12 DIAGNOSIS — J410 Simple chronic bronchitis: Secondary | ICD-10-CM

## 2020-12-14 ENCOUNTER — Other Ambulatory Visit (HOSPITAL_COMMUNITY): Payer: Self-pay

## 2020-12-15 ENCOUNTER — Other Ambulatory Visit (HOSPITAL_COMMUNITY): Payer: Self-pay

## 2020-12-16 ENCOUNTER — Other Ambulatory Visit (HOSPITAL_COMMUNITY): Payer: Self-pay | Admitting: Psychiatry

## 2020-12-18 ENCOUNTER — Other Ambulatory Visit (HOSPITAL_COMMUNITY): Payer: Self-pay

## 2020-12-18 MED ORDER — ALPRAZOLAM 0.5 MG PO TABS
0.5000 mg | ORAL_TABLET | Freq: Three times a day (TID) | ORAL | 0 refills | Status: DC | PRN
  Filled 2020-12-18: qty 30, 10d supply, fill #0

## 2021-01-10 ENCOUNTER — Encounter: Payer: Medicaid Other | Admitting: Gastroenterology

## 2021-01-11 ENCOUNTER — Encounter (HOSPITAL_COMMUNITY): Payer: Self-pay | Admitting: Psychiatry

## 2021-01-11 ENCOUNTER — Other Ambulatory Visit (HOSPITAL_COMMUNITY): Payer: Self-pay

## 2021-01-11 ENCOUNTER — Telehealth (INDEPENDENT_AMBULATORY_CARE_PROVIDER_SITE_OTHER): Payer: Medicaid Other | Admitting: Psychiatry

## 2021-01-11 ENCOUNTER — Ambulatory Visit: Payer: Medicaid Other | Admitting: Infectious Diseases

## 2021-01-11 DIAGNOSIS — F250 Schizoaffective disorder, bipolar type: Secondary | ICD-10-CM | POA: Diagnosis not present

## 2021-01-11 DIAGNOSIS — F411 Generalized anxiety disorder: Secondary | ICD-10-CM

## 2021-01-11 MED ORDER — ALPRAZOLAM 0.5 MG PO TABS
0.5000 mg | ORAL_TABLET | Freq: Three times a day (TID) | ORAL | 0 refills | Status: DC | PRN
  Filled 2021-01-11: qty 30, 10d supply, fill #0

## 2021-01-11 MED ORDER — CITALOPRAM HYDROBROMIDE 20 MG PO TABS
20.0000 mg | ORAL_TABLET | Freq: Every day | ORAL | 3 refills | Status: DC
  Filled 2021-01-11: qty 30, 30d supply, fill #0

## 2021-01-11 MED ORDER — QUETIAPINE FUMARATE 400 MG PO TABS
400.0000 mg | ORAL_TABLET | Freq: Every day | ORAL | 3 refills | Status: DC
  Filled 2021-01-11: qty 30, 30d supply, fill #0

## 2021-01-11 NOTE — Progress Notes (Signed)
BH MD/PA/NP OP Progress Note Virtual Visit via Video Note  I connected with AMBERA Gonzalez on 01/11/21 at  2:00 PM EST by a video enabled telemedicine application and verified that I am speaking with the correct person using two identifiers.  Location: Patient: Home Provider: Clinic   I discussed the limitations of evaluation and management by telemedicine and the availability of in person appointments. The patient expressed understanding and agreed to proceed.  I provided 30 minutes of non-face-to-face time during this encounter.   01/11/2021 2:13 PM Mackenzie Gonzalez  MRN:  [id number]  Chief Complaint: "I have not been feeling well, cant sleep, and having anxiety".   HPI: 60 year old female seen today for follow up psychiatric evaluation.  She has a psychiatric history of schizoaffective disorder and anxiety.   She is currently managed on Xanax 0.5 mg 3 times daily, Seroquel 300 mg nightly and Celexa 20 mg daily.   She notes her medications are somewhat effective in managing her psychiatric conditions.   Today she is well-groomed, calm, cooperative, engages in conversation, and maintains good eye contact.  She informed provider that for the last months she has not been feeling well.  She also notes that she has poor sleep noting that she sleeps 2 to 3 hours and has increased anxiety and depression.  Patient notes that she worries about everything.  She informed Probation officer that her current stressors are her health, her bills, and getting food.  She continues to live with her sister and reports that she is helping manage her health care.  Patient endorses having adequate appetite.  She does endorse visual hallucinations noting that she sees shadows at the corner of her eyes.    Patient notes that the above exacerbates her anxiety and depression.  Today provider conducted a GAD-7 and patient scored a 21, at her last visit she scored a 10.  Provider also conducted PHQ-9 and patient scored a 23, at her  last visit she scored a 22.  Patient notes that recently she has been feeling manic.  She endorses increased energy, mild irritability, racing thoughts, and distractibility.  She notes that she has been trying to stay to herself that she does not getting confrontations.    Patient notes that she continues to be in pain.  She informed Probation officer that her surgery for December 28 on her wrist was canceled.  Patient has a indention from a surgical screw sticking out of her wrist.  She notes that she takes over-the-counter Tylenol and her old prescription of gabapentin as needed.  Provider asked patient if she wanted to restart gabapentin to help manage her pain however however she notes that she was not.  She was agreeable to increasing Seroquel 300 mg to 400 mg to help manage anxiety, depression, psychosis, and sleep.  She will continue her other medication as prescribed.  Provider informed patient that Xanax will be reduced at her next visit.  She endorsed understanding and agreed to no other concerns at this time.      Visit Diagnosis:    ICD-10-CM   1. Generalized anxiety disorder  F41.1 ALPRAZolam (XANAX) 0.5 MG tablet    2. Schizoaffective disorder, bipolar type (HCC)  F25.0 citalopram (CELEXA) 20 MG tablet    QUEtiapine (SEROQUEL) 400 MG tablet       Past Psychiatric History: Schizoaffective disorder, polysubstance abuse  Past Medical History:  Past Medical History:  Diagnosis Date   Anxiety    Bipolar 1 disorder ([place])  Chronic bronchitis ([place])    Hepatitis C carrier ([place])    HIV (human immunodeficiency virus infection) ([place]) 2015   HIV (human immunodeficiency virus infection) ([place])    Psychiatric disorder    reported h/o schizoaffective, bipolar, anxiety   Schizoaffective disorder ([place])     Past Surgical History:  Procedure Laterality Date   BIOPSY  06/18/2020   Procedure: BIOPSY;  Surgeon: [name], MD;  Location: [place];  Service: Gastroenterology;;   BREAST  EXCISIONAL BIOPSY Left 1987   benign   ESOPHAGOGASTRODUODENOSCOPY (EGD) WITH PROPOFOL N/A 06/18/2020   Procedure: ESOPHAGOGASTRODUODENOSCOPY (EGD) WITH PROPOFOL;  Surgeon: [name], MD;  Location: [place];  Service: Gastroenterology;  Laterality: N/A;   I & D EXTREMITY Right 06/16/2020   Procedure: Open debridement of skin subcutaneous tissue and bone associated with open grade 2 fracture right hand;Radiographs 3 views right hand Complex wound closure degloving injury dorsal aspect of the hand greater than 10 cm. Open reduction and internal fixation right small finger metacarpal shaft ;  Surgeon: [name], MD;  Location: [facility];  Service: Orthopedics;  Laterality: Right;   ORIF WRIST FRACTURE Left 06/16/2020   Procedure: Open reduction internal fixation displaced intra-articular distal radius fracture left wrist 3 more fragments; Radiographs 3 views left wrist Left wrist brachioadialis tendon release, tendon tenotomy;  Surgeon: [name], MD;  Location: [place];  Service: Orthopedics;  Laterality: Left;   SP ARTHRO WRIST*R*     WRIST SURGERY      Family Psychiatric History: Mother- bipolar d/o, father- alcohol abuse  Family History:  Family History  Problem Relation Age of Onset   Psychiatric Illness Mother    Hepatitis Father    Alcohol abuse Father    Breast cancer Paternal Aunt    Colon cancer Neg Hx    Stomach cancer Neg Hx     Social History:  Social History   Socioeconomic History   Marital status: Single    Spouse name: Not on file   Number of children: Not on file   Years of education: Not on file   Highest education level: Not on file  Occupational History   Not on file  Tobacco Use   Smoking status: Every Day    Packs/day: 0.50    Years: 48.00    Pack years: 24.00    Types: Cigarettes   Smokeless tobacco: Never  Vaping Use   Vaping Use: Never used  Substance and Sexual Activity   Alcohol use: Not Currently   Drug use: Not Currently    Types:  "Crack" cocaine, IV    Comment: former   Sexual activity: Not Currently    Partners: Female, Female  Other Topics Concern   Not on file  Social History Narrative   ** Merged History Encounter **       Social Determinants of Health   Financial Resource Strain: Not on file  Food Insecurity: Not on file  Transportation Needs: Not on file  Physical Activity: Not on file  Stress: Not on file  Social Connections: Not on file    Allergies:  Allergies  Allergen Reactions   Haldol [Haloperidol] Other (See Comments)    Makes jittery    Metabolic Disorder Labs: Lab Results  Component Value Date   HGBA1C 5.5 09/18/2020   MPG 111.15 09/18/2020   MPG 105.41 07/26/2019   No results found for: PROLACTIN Lab Results  Component Value Date   CHOL 232 (H) 09/18/2020   TRIG 161 (H) 09/18/2020  HDL 36 (L) 09/18/2020   CHOLHDL 6.4 09/18/2020   VLDL 32 09/18/2020   LDLCALC 164 (H) 09/18/2020   LDLCALC 121 (H) 07/26/2019   Lab Results  Component Value Date   TSH 1.726 07/26/2019    Therapeutic Level Labs: No results found for: LITHIUM No results found for: VALPROATE No components found for:  CBMZ  Current Medications: Current Outpatient Medications  Medication Sig Dispense Refill   albuterol (VENTOLIN HFA) 108 (90 Base) MCG/ACT inhaler Inhale 2 puffs into the lungs every 6 (six) hours as needed for wheezing or shortness of breath. 8.5 g 6   ALPRAZolam (XANAX) 0.5 MG tablet Take 1 tablet (0.5 mg total) by mouth 3 (three) times daily as needed for anxiety. 30 tablet 0   aspirin 81 MG chewable tablet Chew 1 tablet (81 mg total) by mouth daily.     atorvastatin (LIPITOR) 80 MG tablet Take 1 tablet (80 mg total) by mouth daily. 90 tablet 0   bictegravir-emtricitabine-tenofovir AF (BIKTARVY) 50-200-25 MG TABS tablet Take 1 tablet by mouth daily. 90 tablet 0   citalopram (CELEXA) 20 MG tablet Take 1 tablet (20 mg total) by mouth daily. 30 tablet 3   cyanocobalamin 1000 MCG tablet Take  1 tablet (1,000 mcg total) by mouth daily. 30 tablet 0   linaclotide (LINZESS) 145 MCG CAPS capsule Take 1 capsule (145 mcg total) by mouth daily before breakfast. 30 capsule 0   pantoprazole (PROTONIX) 40 MG tablet Take 1 tablet (40 mg total) by mouth daily. 90 tablet 0   polyethylene glycol (MIRALAX / GLYCOLAX) 17 g packet Take 17 g by mouth 2 (two) times daily as needed for mild constipation. 14 each 0   QUEtiapine (SEROQUEL) 400 MG tablet Take 1 tablet (400 mg total) by mouth at bedtime. 30 tablet 3   senna-docusate (SENOKOT-S) 8.6-50 MG tablet Take 1 tablet by mouth 2 (two) times daily.     sucralfate (CARAFATE) 1 g tablet Take 1 tablet (1 g total) by mouth 4 (four) times daily -  with meals and at bedtime. 90 tablet 0   valACYclovir (VALTREX) 1000 MG tablet Take 1,000 mg by mouth See admin instructions. Take 1 tablet by mouth three times daily ONLY when having an active break out     No current facility-administered medications for this visit.     Musculoskeletal: Strength & Muscle Tone: decreased Gait & Station: unsteady, Patient notes that she uses a walker Patient leans: N/A  Psychiatric Specialty Exam: Review of Systems  There were no vitals taken for this visit.There is no height or weight on file to calculate BMI.  General Appearance: Well Groomed  Eye Contact:  Good  Speech:  Clear and Coherent and Normal Rate  Volume:  Normal  Mood:  Anxious and Depressed  Affect:  Appropriate and Congruent  Thought Process:  Coherent, Goal Directed, and Linear  Orientation:  Full (Time, Place, and Person)  Thought Content: Logical and Hallucinations: Visual   Suicidal Thoughts:  No  Homicidal Thoughts:  No  Memory:  Immediate;   Good Recent;   Good Remote;   Good  Judgement:  Fair  Insight:  Fair  Psychomotor Activity:  Normal  Concentration:  Concentration: Good and Attention Span: Good  Recall:  Good  Fund of Knowledge: Good  Language: Good  Akathisia:  No  Handed:  Right   AIMS (if indicated): not done  Assets:  Communication Skills Desire for Improvement Financial Resources/Insurance Housing Leisure Time [place]  ADL's:  Intact  Cognition: WNL  Sleep:  Fair   Screenings: GAD-7    Flowsheet Row Video Visit from 01/11/2021 in [facility] Office Visit from 11/07/2020 in [place] Video Visit from 10/10/2020 in [facility] Video Visit from 07/13/2020 in [facility]  Total GAD-7 Score 21 15 10 3       PHQ2-9    Flowsheet Row Video Visit from 01/11/2021 in [facility] Office Visit from 11/07/2020 in [place] Office Visit from 10/31/2020 in [facility] Video Visit from 10/10/2020 in [facility] Video Visit from 07/13/2020 in [facility]  PHQ-2 Total Score 6 4 5 6  0  PHQ-9 Total Score 23 19 16 22 2       Flowsheet Row Video Visit from 01/11/2021 in [facility] Video Visit from 10/10/2020 in [facility] Admission (Discharged) from 09/21/2020 in [place] Error: Q7 should not be populated when Q6 is No No Risk No Risk        Assessment and Plan: Patient endorses symptoms of hypomania, depression, insomnia, and pain.  Provider asked patient if she wanted to restart gabapentin to help manage pain however she notes that she does not.  She was agreeable to increasing Seroquel 300 mg to 400 mg to help manage symptoms of psychosis, mood, anxiety, sleep, and depression.  Provider informed patient that Xanax would be reduced at her next visit.  She endorsed understanding and agreed.  She will continue her other medications as prescribed.  1. Schizoaffective disorder, bipolar  type ([place])  Continue- citaloram (CELEXA) 20 MG tablet; Take 1 tablet (20 mg total) by mouth daily.  Dispense: 30 tablet; Refill: 3 Increase- QUEtiapine (SEROQUEL) 400 MG tablet; Take 1 tablet (400 mg total) by mouth at bedtime.  Dispense: 30 tablet; Refill: 3  2. Generalized anxiety disorder  Continue- ALPRAZolam (XANAX) 0.5 MG tablet; Take 1 tablet (0.5 mg total) by mouth 3 (three) times daily as needed for anxiety.  Dispense: 30 tablet; Refill: 0  Follow-up in 3 months Salley Slaughter, NP 01/11/2021, 2:13 PM

## 2021-01-12 ENCOUNTER — Other Ambulatory Visit (HOSPITAL_COMMUNITY): Payer: Self-pay

## 2021-01-16 ENCOUNTER — Other Ambulatory Visit (HOSPITAL_COMMUNITY): Payer: Self-pay

## 2021-01-23 ENCOUNTER — Other Ambulatory Visit (HOSPITAL_COMMUNITY): Payer: Self-pay

## 2021-01-31 ENCOUNTER — Other Ambulatory Visit: Payer: Self-pay | Admitting: Pharmacist

## 2021-01-31 ENCOUNTER — Other Ambulatory Visit (HOSPITAL_COMMUNITY): Payer: Self-pay

## 2021-01-31 MED ORDER — BIKTARVY 50-200-25 MG PO TABS
1.0000 | ORAL_TABLET | Freq: Every day | ORAL | 0 refills | Status: DC
  Filled 2021-01-31: qty 30, 30d supply, fill #0

## 2021-02-01 ENCOUNTER — Other Ambulatory Visit (HOSPITAL_COMMUNITY): Payer: Self-pay

## 2021-02-01 ENCOUNTER — Encounter: Payer: Self-pay | Admitting: Gastroenterology

## 2021-02-01 ENCOUNTER — Encounter (HOSPITAL_BASED_OUTPATIENT_CLINIC_OR_DEPARTMENT_OTHER): Payer: Self-pay | Admitting: Orthopedic Surgery

## 2021-02-06 ENCOUNTER — Encounter: Payer: Medicaid Other | Admitting: Gastroenterology

## 2021-02-08 ENCOUNTER — Ambulatory Visit: Payer: Medicaid Other | Admitting: Infectious Diseases
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
## 2021-02-12 ENCOUNTER — Ambulatory Visit (HOSPITAL_BASED_OUTPATIENT_CLINIC_OR_DEPARTMENT_OTHER)
Admission: RE | Admit: 2021-02-12 | Discharge: 2021-02-12 | Disposition: A | Discharge status: Home / Self Care | Payer: Medicaid Other | Source: Ambulatory Visit | Attending: Orthopedic Surgery | Admitting: Orthopedic Surgery

## 2021-02-12 ENCOUNTER — Encounter (HOSPITAL_BASED_OUTPATIENT_CLINIC_OR_DEPARTMENT_OTHER)
Admission: RE | Disposition: A | Discharge status: Home / Self Care | Payer: Self-pay | Source: Ambulatory Visit | Attending: Orthopedic Surgery

## 2021-02-12 ENCOUNTER — Ambulatory Visit (HOSPITAL_BASED_OUTPATIENT_CLINIC_OR_DEPARTMENT_OTHER): Payer: Medicaid Other | Admitting: Anesthesiology

## 2021-02-12 ENCOUNTER — Other Ambulatory Visit: Payer: Self-pay

## 2021-02-12 ENCOUNTER — Other Ambulatory Visit (HOSPITAL_COMMUNITY): Payer: Self-pay

## 2021-02-12 ENCOUNTER — Encounter (HOSPITAL_BASED_OUTPATIENT_CLINIC_OR_DEPARTMENT_OTHER): Payer: Self-pay | Admitting: Orthopedic Surgery

## 2021-02-12 ENCOUNTER — Ambulatory Visit (HOSPITAL_BASED_OUTPATIENT_CLINIC_OR_DEPARTMENT_OTHER): Payer: Medicaid Other

## 2021-02-12 DIAGNOSIS — F17210 Nicotine dependence, cigarettes, uncomplicated: Secondary | ICD-10-CM | POA: Diagnosis not present

## 2021-02-12 DIAGNOSIS — T8484XA Pain due to internal orthopedic prosthetic devices, implants and grafts, initial encounter: Secondary | ICD-10-CM | POA: Diagnosis present

## 2021-02-12 DIAGNOSIS — Y793 Surgical instruments, materials and orthopedic devices (including sutures) associated with adverse incidents: Secondary | ICD-10-CM | POA: Diagnosis not present

## 2021-02-12 DIAGNOSIS — K219 Gastro-esophageal reflux disease without esophagitis: Secondary | ICD-10-CM | POA: Diagnosis not present

## 2021-02-12 HISTORY — PX: HARDWARE REMOVAL: SHX979

## 2021-02-12 HISTORY — DX: Gastro-esophageal reflux disease without esophagitis: K21.9

## 2021-02-12 HISTORY — DX: Cerebral infarction, unspecified: I63.9

## 2021-02-12 SURGERY — REMOVAL, HARDWARE
Anesthesia: Monitor Anesthesia Care | Site: Hand | Laterality: Right

## 2021-02-12 MED ORDER — ONDANSETRON HCL 4 MG/2ML IJ SOLN
INTRAMUSCULAR | Status: AC
  Filled 2021-02-12: qty 2

## 2021-02-12 MED ORDER — FENTANYL CITRATE (PF) 100 MCG/2ML IJ SOLN
INTRAMUSCULAR | Status: DC | PRN
  Administered 2021-02-12 (×2): 50 ug via INTRAVENOUS

## 2021-02-12 MED ORDER — MIDAZOLAM HCL 2 MG/2ML IJ SOLN
INTRAMUSCULAR | Status: AC
  Filled 2021-02-12: qty 2

## 2021-02-12 MED ORDER — PROPOFOL 500 MG/50ML IV EMUL
INTRAVENOUS | Status: AC
  Filled 2021-02-12: qty 50

## 2021-02-12 MED ORDER — CEFAZOLIN SODIUM-DEXTROSE 2-4 GM/100ML-% IV SOLN
2.0000 g | INTRAVENOUS | Status: AC
  Administered 2021-02-12: 2 g via INTRAVENOUS

## 2021-02-12 MED ORDER — HYDROMORPHONE HCL 1 MG/ML IJ SOLN: 0.2500 mg | INTRAMUSCULAR | Status: DC | PRN

## 2021-02-12 MED ORDER — OXYCODONE HCL 5 MG/5ML PO SOLN: 5.0000 mg | Freq: Once | ORAL | Status: DC | PRN

## 2021-02-12 MED ORDER — LACTATED RINGERS IV SOLN: INTRAVENOUS | Status: DC

## 2021-02-12 MED ORDER — ONDANSETRON HCL 4 MG/2ML IJ SOLN
INTRAMUSCULAR | Status: DC | PRN
  Administered 2021-02-12: 4 mg via INTRAVENOUS

## 2021-02-12 MED ORDER — CEFAZOLIN SODIUM-DEXTROSE 2-4 GM/100ML-% IV SOLN
INTRAVENOUS | Status: AC
  Filled 2021-02-12: qty 100

## 2021-02-12 MED ORDER — ROPIVACAINE HCL 5 MG/ML IJ SOLN
INTRAMUSCULAR | Status: DC | PRN
  Administered 2021-02-12: 30 mL via PERINEURAL

## 2021-02-12 MED ORDER — FENTANYL CITRATE (PF) 100 MCG/2ML IJ SOLN
100.0000 ug | Freq: Once | INTRAMUSCULAR | Status: AC
  Administered 2021-02-12: 100 ug via INTRAVENOUS

## 2021-02-12 MED ORDER — MIDAZOLAM HCL 2 MG/2ML IJ SOLN
2.0000 mg | Freq: Once | INTRAMUSCULAR | Status: AC
  Administered 2021-02-12: 2 mg via INTRAVENOUS

## 2021-02-12 MED ORDER — FENTANYL CITRATE (PF) 100 MCG/2ML IJ SOLN
INTRAMUSCULAR | Status: AC
  Filled 2021-02-12: qty 2

## 2021-02-12 MED ORDER — OXYCODONE-ACETAMINOPHEN 10-325 MG PO TABS
1.0000 | ORAL_TABLET | Freq: Four times a day (QID) | ORAL | 0 refills | Status: AC | PRN
  Filled 2021-02-12: qty 28, 7d supply, fill #0

## 2021-02-12 MED ORDER — PROMETHAZINE HCL 25 MG/ML IJ SOLN: 6.2500 mg | INTRAMUSCULAR | Status: DC | PRN

## 2021-02-12 MED ORDER — OXYCODONE HCL 5 MG PO TABS: 5.0000 mg | ORAL_TABLET | Freq: Once | ORAL | Status: DC | PRN

## 2021-02-12 MED ORDER — PROPOFOL 500 MG/50ML IV EMUL
INTRAVENOUS | Status: DC | PRN
  Administered 2021-02-12: 100 ug/kg/min via INTRAVENOUS

## 2021-02-12 SURGICAL SUPPLY — 55 items
APL SKNCLS STERI-STRIP NONHPOA (GAUZE/BANDAGES/DRESSINGS)
BENZOIN TINCTURE PRP APPL 2/3 (GAUZE/BANDAGES/DRESSINGS) IMPLANT
BLADE SURG 15 STRL LF DISP TIS (BLADE) ×1 IMPLANT
BLADE SURG 15 STRL SS (BLADE) ×4
BNDG CMPR 9X4 STRL LF SNTH (GAUZE/BANDAGES/DRESSINGS) ×1
BNDG ELASTIC 3X5.8 VLCR STR LF (GAUZE/BANDAGES/DRESSINGS) ×2 IMPLANT
BNDG ELASTIC 4X5.8 VLCR STR LF (GAUZE/BANDAGES/DRESSINGS) IMPLANT
BNDG ESMARK 4X9 LF (GAUZE/BANDAGES/DRESSINGS) ×2 IMPLANT
BNDG GAUZE ELAST 4 BULKY (GAUZE/BANDAGES/DRESSINGS) ×2 IMPLANT
CORD BIPOLAR FORCEPS 12FT (ELECTRODE) ×2 IMPLANT
COVER BACK TABLE 60X90IN (DRAPES) ×2 IMPLANT
COVER MAYO STAND STRL (DRAPES) ×2 IMPLANT
CUFF TOURN SGL QUICK 18 NS (TOURNIQUET CUFF) ×1 IMPLANT
CUFF TOURN SGL QUICK 18X4 (TOURNIQUET CUFF) IMPLANT
DECANTER SPIKE VIAL GLASS SM (MISCELLANEOUS) IMPLANT
DRAPE EXTREMITY T 121X128X90 (DISPOSABLE) ×2 IMPLANT
DRAPE OEC MINIVIEW 54X84 (DRAPES) ×1 IMPLANT
DRAPE SURG 17X23 STRL (DRAPES) ×1 IMPLANT
DRAPE U-SHAPE 47X51 STRL (DRAPES) ×1 IMPLANT
DRSG EMULSION OIL 3X3 NADH (GAUZE/BANDAGES/DRESSINGS) ×2 IMPLANT
GAUZE SPONGE 4X4 12PLY STRL (GAUZE/BANDAGES/DRESSINGS) ×2 IMPLANT
GLOVE SURG ENC MOIS LTX SZ6.5 (GLOVE) ×2 IMPLANT
GLOVE SURG ORTHO LTX SZ8 (GLOVE) ×2 IMPLANT
GLOVE SURG POLYISO LF SZ7 (GLOVE) ×1 IMPLANT
GLOVE SURG UNDER POLY LF SZ6.5 (GLOVE) ×2 IMPLANT
GLOVE SURG UNDER POLY LF SZ7 (GLOVE) ×2 IMPLANT
GLOVE SURG UNDER POLY LF SZ8.5 (GLOVE) ×2 IMPLANT
GOWN STRL REUS W/ TWL LRG LVL3 (GOWN DISPOSABLE) ×1 IMPLANT
GOWN STRL REUS W/ TWL XL LVL3 (GOWN DISPOSABLE) ×1 IMPLANT
GOWN STRL REUS W/TWL LRG LVL3 (GOWN DISPOSABLE) ×2
GOWN STRL REUS W/TWL XL LVL3 (GOWN DISPOSABLE) ×4
NDL HYPO 25X1 1.5 SAFETY (NEEDLE) ×1 IMPLANT
NEEDLE HYPO 25X1 1.5 SAFETY (NEEDLE) ×2 IMPLANT
NS IRRIG 1000ML POUR BTL (IV SOLUTION) ×2 IMPLANT
PACK BASIN DAY SURGERY FS (CUSTOM PROCEDURE TRAY) ×2 IMPLANT
PAD CAST 3X4 CTTN HI CHSV (CAST SUPPLIES) ×1 IMPLANT
PADDING CAST ABS 3INX4YD NS (CAST SUPPLIES)
PADDING CAST ABS 4INX4YD NS (CAST SUPPLIES) ×1
PADDING CAST ABS COTTON 3X4 (CAST SUPPLIES) IMPLANT
PADDING CAST ABS COTTON 4X4 ST (CAST SUPPLIES) ×1 IMPLANT
PADDING CAST COTTON 3X4 STRL (CAST SUPPLIES) ×2
SLING ARM FOAM STRAP LRG (SOFTGOODS) ×1 IMPLANT
SPLINT FIBERGLASS 3X35 (CAST SUPPLIES) ×1 IMPLANT
SPLINT PLASTER CAST XFAST 3X15 (CAST SUPPLIES) IMPLANT
SPLINT PLASTER XTRA FASTSET 3X (CAST SUPPLIES)
STOCKINETTE 4X48 STRL (DRAPES) ×2 IMPLANT
STRIP CLOSURE SKIN 1/2X4 (GAUZE/BANDAGES/DRESSINGS) IMPLANT
SUT MNCRL AB 3-0 PS2 18 (SUTURE) IMPLANT
SUT MNCRL AB 4-0 PS2 18 (SUTURE) IMPLANT
SUT PROLENE 4 0 PS 2 18 (SUTURE) ×2 IMPLANT
SYR BULB EAR ULCER 3OZ GRN STR (SYRINGE) ×2 IMPLANT
SYR CONTROL 10ML LL (SYRINGE) ×2 IMPLANT
TOWEL GREEN STERILE FF (TOWEL DISPOSABLE) ×2 IMPLANT
TRAY DSU PREP LF (CUSTOM PROCEDURE TRAY) ×2 IMPLANT
UNDERPAD 30X36 HEAVY ABSORB (UNDERPADS AND DIAPERS) ×2 IMPLANT

## 2021-02-12 NOTE — Anesthesia Preprocedure Evaluation (Signed)
Anesthesia Evaluation  Patient identified by MRN, date of birth, ID band Patient awake    Reviewed: Allergy & Precautions, H&P , NPO status , Patient's Chart, lab work & pertinent test results  Airway Mallampati: II  TM Distance: >3 FB Neck ROM: Full    Dental no notable dental hx. (+) Teeth Intact, Dental Advisory Given   Pulmonary Current Smoker and Patient abstained from smoking.,    Pulmonary exam normal breath sounds clear to auscultation       Cardiovascular negative cardio ROS   Rhythm:Regular Rate:Normal     Neuro/Psych Anxiety Bipolar Disorder Schizophrenia negative neurological ROS  negative psych ROS   GI/Hepatic GERD  ,(+) Hepatitis -, C  Endo/Other  negative endocrine ROS  Renal/GU negative Renal ROS  negative genitourinary   Musculoskeletal   Abdominal (+) + obese,   Peds  Hematology  (+) HIV,   Anesthesia Other Findings   Reproductive/Obstetrics negative OB ROS                             Anesthesia Physical  Anesthesia Plan  ASA: 3  Anesthesia Plan: MAC and Regional   Post-op Pain Management:    Induction: Intravenous  PONV Risk Score and Plan: 1 and Ondansetron and Treatment may vary due to age or medical condition  Airway Management Planned: Simple Face Mask  Additional Equipment:   Intra-op Plan:   Post-operative Plan:   Informed Consent: I have reviewed the patients History and Physical, chart, labs and discussed the procedure including the risks, benefits and alternatives for the proposed anesthesia with the patient or authorized representative who has indicated his/her understanding and acceptance.     Dental advisory given  Plan Discussed with: CRNA  Anesthesia Plan Comments:         Anesthesia Quick Evaluation

## 2021-02-12 NOTE — Progress Notes (Signed)
Assisted Dr. Sabra Heck with right, supraclavicular block. Side rails up, monitors on throughout procedure. See vital signs in flow sheet. Tolerated Procedure well.

## 2021-02-12 NOTE — Discharge Instructions (Addendum)
KEEP BANDAGE CLEAN AND DRY CALL OFFICE FOR F/U APPT [phone] RX SENT TO [facility] - PERCOCET 10-325MG  1 PO Q 6HRS PRN PAIN  KEEP HAND ELEVATED ABOVE HEART OK TO APPLY ICE TO OPERATIVE AREA CONTACT OFFICE IF ANY WORSENING PAIN OR CONCERNS.    Post Anesthesia Home Care Instructions  Activity: Get plenty of rest for the remainder of the day. A responsible individual must stay with you for 24 hours following the procedure.  For the next 24 hours, DO NOT: -Drive a car -Paediatric nurse -Drink alcoholic beverages -Take any medication unless instructed by your physician -Make any legal decisions or sign important papers.  Meals: Start with liquid foods such as gelatin or soup. Progress to regular foods as tolerated. Avoid greasy, spicy, heavy foods. If nausea and/or vomiting occur, drink only clear liquids until the nausea and/or vomiting subsides. Call your physician if vomiting continues.  Special Instructions/Symptoms: Your throat may feel dry or sore from the anesthesia or the breathing tube placed in your throat during surgery. If this causes discomfort, gargle with warm salt water. The discomfort should disappear within 24 hours.  If you had a scopolamine patch placed behind your ear for the management of post- operative nausea and/or vomiting:  1. The medication in the patch is effective for 72 hours, after which it should be removed.  Wrap patch in a tissue and discard in the trash. Wash hands thoroughly with soap and water. 2. You may remove the patch earlier than 72 hours if you experience unpleasant side effects which may include dry mouth, dizziness or visual disturbances. 3. Avoid touching the patch. Wash your hands with soap and water after contact with the patch.    Regional Anesthesia Blocks  1. Numbness or the inability to move the "blocked" extremity may last from 3-48 hours after placement. The length of time depends on the medication injected and your  individual response to the medication. If the numbness is not going away after 48 hours, call your surgeon.  2. The extremity that is blocked will need to be protected until the numbness is gone and the  Strength has returned. Because you cannot feel it, you will need to take extra care to avoid injury. Because it may be weak, you may have difficulty moving it or using it. You may not know what position it is in without looking at it while the block is in effect.  3. For blocks in the legs and feet, returning to weight bearing and walking needs to be done carefully. You will need to wait until the numbness is entirely gone and the strength has returned. You should be able to move your leg and foot normally before you try and bear weight or walk. You will need someone to be with you when you first try to ensure you do not fall and possibly risk injury.  4. Bruising and tenderness at the needle site are common side effects and will resolve in a few days.  5. Persistent numbness or new problems with movement should be communicated to the surgeon or the [place] [phone] [place] [phone]).

## 2021-02-12 NOTE — Anesthesia Postprocedure Evaluation (Signed)
Anesthesia Post Note  Patient: Mackenzie Gonzalez  Procedure(s) Performed: Right hand metacarpal deep implant removal (Right: Hand)     Patient location during evaluation: PACU Anesthesia Type: Regional Level of consciousness: awake and alert Pain management: pain level controlled Vital Signs Assessment: post-procedure vital signs reviewed and stable Respiratory status: spontaneous breathing, nonlabored ventilation, respiratory function stable and patient connected to nasal cannula oxygen Cardiovascular status: stable and blood pressure returned to baseline Postop Assessment: no apparent nausea or vomiting Anesthetic complications: no   No notable events documented.  Last Vitals:  Vitals:   02/12/21 1345 02/12/21 1400  BP: 131/76 (!) 158/92  Pulse: (!) 52 (!) 56  Resp: 14 16  Temp:  (!) 36.4 C  SpO2: 98% 94%    Last Pain:  Vitals:   02/12/21 1400  PainSc: 0-No pain                 Barnet Glasgow
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
## 2021-02-12 NOTE — Anesthesia Procedure Notes (Signed)
Anesthesia Regional Block: Supraclavicular block   Pre-Anesthetic Checklist: , timeout performed,  Correct Patient, Correct Site, Correct Laterality,  Correct Procedure, Correct Position, site marked,  Risks and benefits discussed,  Surgical consent,  Pre-op evaluation,  At surgeon's request and post-op pain management  Laterality: Right  Prep: chloraprep       Needles:  Injection technique: Single-shot  Needle Type: Stimiplex     Needle Length: 9cm  Needle Gauge: 21     Additional Needles:   Procedures:,,,, ultrasound used (permanent image in chart),,    Narrative:  Start time: 02/12/2021 10:28 AM End time: 02/12/2021 10:33 AM Injection made incrementally with aspirations every 5 mL.  Performed by: Personally  Anesthesiologist: Lynda Rainwater, MD

## 2021-02-12 NOTE — Transfer of Care (Signed)
Immediate Anesthesia Transfer of Care Note  Patient: ENDA SANTO  Procedure(s) Performed: Right hand metacarpal deep implant removal (Right: Hand)  Patient Location: PACU  Anesthesia Type:MAC and Regional  Level of Consciousness: awake, alert  and oriented  Airway & Oxygen Therapy: Patient Spontanous Breathing and Patient connected to face mask oxygen  Post-op Assessment: Report given to RN and Post -op Vital signs reviewed and stable  Post vital signs: Reviewed and stable  Last Vitals:  Vitals Value Taken Time  BP    Temp    Pulse 48 02/12/21 1335  Resp    SpO2 99 % 02/12/21 1335  Vitals shown include unvalidated device data.  Last Pain:  Vitals:   02/12/21 0934  PainSc: 0-No pain         Complications: No notable events documented.

## 2021-02-13 ENCOUNTER — Encounter (HOSPITAL_BASED_OUTPATIENT_CLINIC_OR_DEPARTMENT_OTHER): Payer: Self-pay | Admitting: Orthopedic Surgery

## 2021-02-22 ENCOUNTER — Other Ambulatory Visit (HOSPITAL_COMMUNITY): Payer: Self-pay

## 2021-02-22 ENCOUNTER — Ambulatory Visit (INDEPENDENT_AMBULATORY_CARE_PROVIDER_SITE_OTHER): Payer: Medicaid Other | Admitting: Pharmacist

## 2021-02-22 ENCOUNTER — Other Ambulatory Visit: Payer: Self-pay

## 2021-02-22 DIAGNOSIS — B20 Human immunodeficiency virus [HIV] disease: Secondary | ICD-10-CM | POA: Diagnosis not present

## 2021-02-22 MED ORDER — BIKTARVY 50-200-25 MG PO TABS
1.0000 | ORAL_TABLET | Freq: Every day | ORAL | 0 refills | Status: DC
  Filled 2021-02-22: qty 90, 90d supply, fill #0
  Filled 2021-02-23: qty 30, 30d supply, fill #0
  Filled 2021-03-21: qty 30, 30d supply, fill #1
  Filled 2021-04-26: qty 30, 30d supply, fill #2

## 2021-02-22 MED ORDER — BIKTARVY 50-200-25 MG PO TABS: 1.0000 | ORAL_TABLET | Freq: Every day | ORAL | 0 refills | Status: DC

## 2021-02-22 NOTE — Progress Notes (Signed)
02/22/2021  HPI: Mackenzie Gonzalez is a 61 y.o. female who presents to the [facility] for HIV follow-up.  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   Right wrist fracture, sequela 07/11/2020   Dysphagia 06/29/2020   Left wrist fracture, sequela 06/25/2020   Tobacco dependence 06/18/2020   Anemia due to GI blood loss    Esophagitis, [place] grade D    HIV (human immunodeficiency virus infection) ([place])    Psychiatric disorder    Hand injuries, unspecified laterality, sequela 06/16/2020   Herpes zoster 03/16/2020   Mixed stress and urge urinary incontinence 03/16/2020   Suspected orbital vs preseptal cellultiis 01/14/2020   Constipation 01/07/2020   Vasomotor symptoms due to menopause 12/15/2019   Pain in joint involving multiple sites 12/15/2019   Encounter for preventive care 12/15/2019   Simple chronic bronchitis ([place]) 09/02/2019   Anxiety disorder 07/12/2019   Chronic pain 05/25/2019   Hepatitis B immune 04/20/2019   Hepatitis A immune 04/20/2019   History of hepatitis C 04/20/2019   HIV disease ([place]) 04/20/2019   Tobacco abuse 04/20/2019   Schizoaffective disorder, bipolar type ([place]) 04/20/2019   GERD (gastroesophageal reflux disease) 2015   Slurred speech 2015    Patient's Medications  New Prescriptions   No medications on file  Previous Medications   ALBUTEROL (VENTOLIN HFA) 108 (90 BASE) MCG/ACT INHALER    Inhale 2 puffs into the lungs every 6 (six) hours as needed for wheezing or shortness of breath.   ALPRAZOLAM (XANAX) 0.5 MG TABLET    Take 1 tablet (0.5 mg total) by mouth 3 (three) times daily as needed for anxiety.   ASPIRIN 81  MG CHEWABLE TABLET    Chew 1 tablet (81 mg total) by mouth daily.   ATORVASTATIN (LIPITOR) 80 MG TABLET    Take 1 tablet (80 mg total) by mouth daily.   BICTEGRAVIR-EMTRICITABINE-TENOFOVIR AF (BIKTARVY) 50-200-25 MG TABS TABLET    Take 1 tablet by mouth daily.   CITALOPRAM (CELEXA) 20 MG TABLET    Take 1 tablet (20 mg total) by mouth daily.   CYANOCOBALAMIN 1000 MCG TABLET    Take 1 tablet (1,000 mcg total) by mouth daily.   LINACLOTIDE (LINZESS) 145 MCG CAPS CAPSULE    Take 1 capsule (145 mcg total) by mouth daily before breakfast.   PANTOPRAZOLE (PROTONIX) 40 MG TABLET    Take 1 tablet (40 mg total) by mouth daily.   POLYETHYLENE GLYCOL (MIRALAX / GLYCOLAX) 17 G PACKET    Take 17 g by mouth 2 (two) times daily as needed for mild constipation.   QUETIAPINE (SEROQUEL) 400 MG TABLET    Take 1 tablet (400 mg total) by mouth at bedtime.   SENNA-DOCUSATE (SENOKOT-S) 8.6-50 MG TABLET    Take 1 tablet by mouth 2 (two) times daily.   SUCRALFATE (CARAFATE) 1 G TABLET    Take 1 tablet (1 g total) by mouth 4 (four) times daily -  with meals and at bedtime.   VALACYCLOVIR (VALTREX) 1000 MG TABLET    Take 1,000 mg by mouth See admin instructions. Take 1 tablet by mouth three times daily ONLY when having an  active break out  Modified Medications   No medications on file  Discontinued Medications   No medications on file    Allergies: Allergies  Allergen Reactions   Haldol [Haloperidol] Other (See Comments)    Makes jittery    Past Medical History: Past Medical History:  Diagnosis Date   Anxiety    Bipolar 1 disorder ([place])    Chronic bronchitis ([facility])    GERD (gastroesophageal reflux disease)    Hepatitis C carrier ([facility])    HIV (human immunodeficiency virus infection) ([place]) 2015   HIV (human immunodeficiency virus infection) ([place])    Psychiatric disorder    reported h/o schizoaffective, bipolar, anxiety   Schizoaffective disorder ([place])    Stroke ([place])     Social History: Social History    Socioeconomic History   Marital status: Single    Spouse name: Not on file   Number of children: Not on file   Years of education: Not on file   Highest education level: Not on file  Occupational History   Not on file  Tobacco Use   Smoking status: Some Days    Packs/day: 0.50    Years: 48.00    Pack years: 24.00    Types: Cigarettes   Smokeless tobacco: Never  Vaping Use   Vaping Use: Never used  Substance and Sexual Activity   Alcohol use: Not Currently   Drug use: Not Currently    Types: "Crack" cocaine, IV, Marijuana    Comment: former(2021);   Sexual activity: Not Currently    Partners: Female, Female  Other Topics Concern   Not on file  Social History Narrative   ** Merged History Encounter **       Social Determinants of Health   Financial Resource Strain: Not on file  Food Insecurity: Not on file  Transportation Needs: Not on file  Physical Activity: Not on file  Stress: Not on file  Social Connections: Not on file    Labs: Lab Results  Component Value Date   HIV1RNAQUANT Not Detected 10/25/2020   HIV1RNAQUANT NOT DETECTED 07/11/2020   HIV1RNAQUANT 28 (H) 11/30/2019   CD4TABS 749 10/25/2020   CD4TABS 869 07/11/2020   CD4TABS 733 11/30/2019    RPR and STI Lab Results  Component Value Date   LABRPR NON-REACTIVE 07/11/2020   LABRPR NON REACTIVE 06/20/2020   LABRPR NON-REACTIVE 04/20/2019    STI Results GC CT  04/20/2019 Negative Negative    Hepatitis B No results found for: HEPBSAB, HEPBSAG, HEPBCAB Hepatitis C Lab Results  Component Value Date   HCVRNAPCRQN <15 01/07/2020   Hepatitis A No results found for: HAV Lipids: Lab Results  Component Value Date   CHOL 232 (H) 09/18/2020   TRIG 161 (H) 09/18/2020   HDL 36 (L) 09/18/2020   CHOLHDL 6.4 09/18/2020   VLDL 32 09/18/2020   LDLCALC 164 (H) 09/18/2020    Current HIV Regimen: Biktarvy  Assessment: Mackenzie Gonzalez presents for HIV follow-up. Has been taking Biktarvy without issues.  Reports good adherence and no tolerance issues. Gets it mailed to her via [employer], reports no issues obtaining it. Reported food insecurity during visit, will provide a bag from food bank to patient. Will check HIV RNA and CD4 today and send refill for Biktarvy.  Plan: -- Refill Biktarvy -- Check HIV RNA, CD4 count -- F/u scheduled for 05/24/21 at 2:30pm with Dr. Juleen China -- Call with any issues or questions  Michela Pitcher) [place], PharmD Student  02/22/2021, 3:45 PM

## 2021-02-23 ENCOUNTER — Other Ambulatory Visit (HOSPITAL_COMMUNITY): Payer: Self-pay

## 2021-02-23 ENCOUNTER — Ambulatory Visit (AMBULATORY_SURGERY_CENTER): Payer: Medicaid Other | Admitting: *Deleted

## 2021-02-23 ENCOUNTER — Other Ambulatory Visit: Payer: Self-pay

## 2021-02-23 VITALS — Ht 65.0 in | Wt 200.0 lb

## 2021-02-23 DIAGNOSIS — K5900 Constipation, unspecified: Secondary | ICD-10-CM

## 2021-02-23 DIAGNOSIS — K2101 Gastro-esophageal reflux disease with esophagitis, with bleeding: Secondary | ICD-10-CM

## 2021-02-23 DIAGNOSIS — R1030 Lower abdominal pain, unspecified: Secondary | ICD-10-CM

## 2021-02-23 MED ORDER — NA SULFATE-K SULFATE-MG SULF 17.5-3.13-1.6 GM/177ML PO SOLN
2.0000 | Freq: Once | ORAL | 0 refills | Status: AC
  Filled 2021-02-23: qty 354, 1d supply, fill #0

## 2021-02-23 NOTE — Progress Notes (Signed)
No egg or soy allergy known to patient  No issues known to pt with past sedation with any surgeries or procedures Patient denies ever being told they had issues or difficulty with intubation  No FH of Malignant Hyperthermia Pt is not on diet pills Pt is not on  home 02  Pt is not on blood thinners  Pt denies issues with constipation  No A fib or A flutter  Pt is fully vaccinated  for Covid     Due to the COVID-19 pandemic we are asking patients to follow certain guidelines in PV and the [place]   Pt aware of COVID protocols and [facility] guidelines   PV completed over the phone. Pt verified name, DOB, address and insurance during PV today.   Pt encouraged to call with questions or issues.  If pt has My chart, procedure instructions sent via My Chart   Instructed pt. To take Miralax daily until procedure

## 2021-02-24 LAB — HIV-1 RNA QUANT-NO REFLEX-BLD
HIV 1 RNA Quant: NOT DETECTED Copies/mL
HIV-1 RNA Quant, Log: NOT DETECTED Log cps/mL

## 2021-02-24 LAB — T-HELPER CELLS (CD4) COUNT (NOT AT ARMC)
Absolute CD4: 993 cells/uL (ref 490–1740)
CD4 T Helper %: 47 % (ref 30–61)
Total lymphocyte count: 2102 cells/uL (ref 850–3900)

## 2021-02-26 ENCOUNTER — Other Ambulatory Visit (HOSPITAL_COMMUNITY): Payer: Self-pay | Admitting: Psychiatry

## 2021-02-26 ENCOUNTER — Other Ambulatory Visit (HOSPITAL_COMMUNITY): Payer: Self-pay

## 2021-02-26 DIAGNOSIS — F411 Generalized anxiety disorder: Secondary | ICD-10-CM

## 2021-02-26 MED ORDER — ALPRAZOLAM 0.5 MG PO TABS
0.5000 mg | ORAL_TABLET | Freq: Three times a day (TID) | ORAL | 0 refills | Status: DC | PRN
  Filled 2021-02-26: qty 30, 10d supply, fill #0

## 2021-03-05 ENCOUNTER — Encounter (HOSPITAL_COMMUNITY): Payer: Self-pay | Admitting: Psychiatry

## 2021-03-05 ENCOUNTER — Other Ambulatory Visit (HOSPITAL_COMMUNITY): Payer: Self-pay

## 2021-03-05 ENCOUNTER — Telehealth (INDEPENDENT_AMBULATORY_CARE_PROVIDER_SITE_OTHER): Payer: Medicaid Other | Admitting: Psychiatry

## 2021-03-05 DIAGNOSIS — F250 Schizoaffective disorder, bipolar type: Secondary | ICD-10-CM | POA: Diagnosis not present

## 2021-03-05 DIAGNOSIS — F411 Generalized anxiety disorder: Secondary | ICD-10-CM | POA: Diagnosis not present

## 2021-03-05 MED ORDER — ALPRAZOLAM 0.5 MG PO TABS
0.2500 mg | ORAL_TABLET | Freq: Every day | ORAL | 2 refills | Status: DC
Start: 2021-03-05 — End: 2021-06-12
  Filled 2021-03-05: qty 30, 30d supply, fill #0
  Filled 2021-03-23: qty 30, 30d supply, fill #1
  Filled 2021-04-03 – 2021-04-09 (×4): qty 30, 30d supply, fill #0
  Filled 2021-04-24 – 2021-05-07 (×3): qty 30, 30d supply, fill #1

## 2021-03-05 MED ORDER — CITALOPRAM HYDROBROMIDE 20 MG PO TABS
20.0000 mg | ORAL_TABLET | Freq: Every day | ORAL | 3 refills | Status: DC
  Filled 2021-03-05: qty 30, 30d supply, fill #0

## 2021-03-05 MED ORDER — QUETIAPINE FUMARATE 400 MG PO TABS
400.0000 mg | ORAL_TABLET | Freq: Every day | ORAL | 3 refills | Status: DC
  Filled 2021-03-05: qty 30, 30d supply, fill #0
  Filled 2021-03-30: qty 30, 30d supply, fill #1
  Filled 2021-04-03: qty 30, 30d supply, fill #0
  Filled 2021-06-12: qty 30, 30d supply, fill #1
  Filled 2021-06-16: qty 30, 30d supply, fill #2

## 2021-03-05 NOTE — Progress Notes (Signed)
BH MD/PA/NP OP Progress Note Virtual Visit via Video Note  I connected with Mackenzie Gonzalez on 03/05/21 at  3:00 PM EST by a video enabled telemedicine application and verified that I am speaking with the correct person using two identifiers.  Location: Patient: Home Provider: Clinic   I discussed the limitations of evaluation and management by telemedicine and the availability of in person appointments. The patient expressed understanding and agreed to proceed.  I provided 30 minutes of non-face-to-face time during this encounter.   03/05/2021 3:48 PM Mackenzie Gonzalez  MRN:  [id number]  Chief Complaint: "I have not been feeling well. I had surgery on my wrist".   HPI: 61 year old female seen today for follow up psychiatric evaluation.  She has a psychiatric history of schizoaffective disorder and anxiety.   She is currently managed on Xanax 0.5 mg 2 times daily, Seroquel 400 mg nightly and Celexa 20 mg daily.   She notes her medications are effective in managing her psychiatric conditions.   Today she is well-groomed, calm, cooperative, engages in conversation, and maintains good eye contact.  Patient noted to be rocking side to side.  Provider asked patient if she can control/stop her rocking motion and she notes that she cannot.  She however notes that she does not want other medications.  She informed Probation officer that her rocking is different from medication induced TD.  She notes that when she was on Haldol she had akathisia and was unable to sit still.  She notes that she is able to cope with her rocking motions and notes that she does it sometimes to calm herself down as she has been more anxious about her health and her family.  Patient notes that recently she has surgery on her wrist and is in pain most days.  She notes that she has been taking Advil to help manage the pain and notes that she has a follow-up visit with her primary care doctor soon.  Today provider conducted a GAD-7 and patient  scored 21, at her last visit she scored 21.  Provider also conducted PHQ-9 and scored 22, at her last visit she scored a 23.  Patient notes that her sleep fluctuates due to her pain intensity.  She does endorse passive SI however notes that she does not want to harm himself.  She endorses [facility] however notes that she is able to cope with them and request that medications not be adjusted.  Patient informed Probation officer that she has poor eating habits but denies weight loss.  She reports that her appetite is decreased because she is constipated.  She notes that her PCP is managing her with Dulcolax which is somewhat effective.    Patient notes that she has been taking Xanax only as needed.  For the last few months patient has only been given a quantity of 30 Xanax.  Xanax reduced from 0.5 mg twice daily to 0.25-0.5 mg daily. At this time no other medications adjusted made at patient's request.  Provider informed patient to discuss with PCP pain management to see if it helps manage anxiety and depression.  Provider discussed starting gabapentin for pain, anxiety, and mood at next visit if psychiatric symptoms does not improve.  Provider also discussed starting Cogentin, Ingrezza, or a startle to help manage rocking if symptoms does not improve.  She endorsed understanding and agreed. Patient was referred to outpatient counseling to help manage anxiety and depression.    Visit Diagnosis:    ICD-10-CM  1. Generalized anxiety disorder  F41.1 ALPRAZolam (XANAX) 0.5 MG tablet    Ambulatory referral to Social Work    2. Schizoaffective disorder, bipolar type (HCC)  F25.0 QUEtiapine (SEROQUEL) 400 MG tablet    citalopram (CELEXA) 20 MG tablet       Past Psychiatric History: Schizoaffective disorder, polysubstance abuse  Past Medical History:  Past Medical History:  Diagnosis Date   Anxiety    Bipolar 1 disorder ([place])    Cancer ([place])    Chronic bronchitis (HCC)    GERD (gastroesophageal reflux disease)     Hepatitis C carrier ([place])    HIV (human immunodeficiency virus infection) ([place]) 2015   HIV (human immunodeficiency virus infection) ([place])    Psychiatric disorder    reported h/o schizoaffective, bipolar, anxiety   Schizoaffective disorder ([place])    Stroke [facility])     Past Surgical History:  Procedure Laterality Date   BIOPSY  06/18/2020   Procedure: BIOPSY;  Surgeon: [name], MD;  Location: [place];  Service: Gastroenterology;;   BREAST EXCISIONAL BIOPSY Left 1987   benign   ESOPHAGOGASTRODUODENOSCOPY (EGD) WITH PROPOFOL N/A 06/18/2020   Procedure: ESOPHAGOGASTRODUODENOSCOPY (EGD) WITH PROPOFOL;  Surgeon: [name], MD;  Location: [place];  Service: Gastroenterology;  Laterality: N/A;   HARDWARE REMOVAL Right 02/12/2021   Procedure: Right hand metacarpal deep implant removal;  Surgeon: [name], MD;  Location: [place];  Service: Orthopedics;  Laterality: Right;  with IV sedation   I & D EXTREMITY Right 06/16/2020   Procedure: Open debridement of skin subcutaneous tissue and bone associated with open grade 2 fracture right hand;Radiographs 3 views right hand Complex wound closure degloving injury dorsal aspect of the hand greater than 10 cm. Open reduction and internal fixation right small finger metacarpal shaft ;  Surgeon: [name], MD;  Location: [place];  Service: Orthopedics;  Laterality: Right;   ORIF WRIST FRACTURE Left 06/16/2020   Procedure: Open reduction internal fixation displaced intra-articular distal radius fracture left wrist 3 more fragments; Radiographs 3 views left wrist Left wrist brachioadialis tendon release, tendon tenotomy;  Surgeon: [name], MD;  Location: [place];  Service: Orthopedics;  Laterality: Left;   SP ARTHRO WRIST*R*     WRIST SURGERY      Family Psychiatric History: Mother- bipolar d/o, father- alcohol abuse  Family History:  Family History  Problem Relation Age of Onset   Psychiatric Illness Mother     Hepatitis Father    Alcohol abuse Father    Breast cancer Paternal Aunt    Colon cancer Neg Hx    Stomach cancer Neg Hx    Colon polyps Neg Hx    Esophageal cancer Neg Hx    Rectal cancer Neg Hx     Social History:  Social History   Socioeconomic History   Marital status: Single    Spouse name: Not on file   Number of children: Not on file   Years of education: Not on file   Highest education level: Not on file  Occupational History   Not on file  Tobacco Use   Smoking status: Some Days    Packs/day: 0.50    Years: 48.00    Pack years: 24.00    Types: Cigarettes   Smokeless tobacco: Never  Vaping Use   Vaping Use: Never used  Substance and Sexual Activity   Alcohol use: Not Currently   Drug use: Not Currently    Types: "Crack" cocaine, IV, Marijuana  Comment: former(2021);   Sexual activity: Not Currently    Partners: Female, Female  Other Topics Concern   Not on file  Social History Narrative   ** Merged History Encounter **       Social Determinants of Health   Financial Resource Strain: Not on file  Food Insecurity: Not on file  Transportation Needs: Not on file  Physical Activity: Not on file  Stress: Not on file  Social Connections: Not on file    Allergies:  Allergies  Allergen Reactions   Haldol [Haloperidol] Other (See Comments)    Makes jittery    Metabolic Disorder Labs: Lab Results  Component Value Date   HGBA1C 5.5 09/18/2020   MPG 111.15 09/18/2020   MPG 105.41 07/26/2019   No results found for: PROLACTIN Lab Results  Component Value Date   CHOL 232 (H) 09/18/2020   TRIG 161 (H) 09/18/2020   HDL 36 (L) 09/18/2020   CHOLHDL 6.4 09/18/2020   VLDL 32 09/18/2020   LDLCALC 164 (H) 09/18/2020   LDLCALC 121 (H) 07/26/2019   Lab Results  Component Value Date   TSH 1.726 07/26/2019    Therapeutic Level Labs: No results found for: LITHIUM No results found for: VALPROATE No components found for:  CBMZ  Current  Medications: Current Outpatient Medications  Medication Sig Dispense Refill   albuterol (VENTOLIN HFA) 108 (90 Base) MCG/ACT inhaler Inhale 2 puffs into the lungs every 6 (six) hours as needed for wheezing or shortness of breath. 8.5 g 6   ALPRAZolam (XANAX) 0.5 MG tablet Take 1/2 to 1 tablet by mouth daily. 30 tablet 2   aspirin 81 MG chewable tablet Chew 1 tablet (81 mg total) by mouth daily.     atorvastatin (LIPITOR) 80 MG tablet Take 1 tablet (80 mg total) by mouth daily. 90 tablet 0   bictegravir-emtricitabine-tenofovir AF (BIKTARVY) 50-200-25 MG TABS tablet Take 1 tablet by mouth daily. 90 tablet 0   citalopram (CELEXA) 20 MG tablet Take 1 tablet (20 mg total) by mouth daily. 30 tablet 3   cyanocobalamin 1000 MCG tablet Take 1 tablet (1,000 mcg total) by mouth daily. (Patient not taking: Reported on 02/23/2021) 30 tablet 0   linaclotide (LINZESS) 145 MCG CAPS capsule Take 1 capsule (145 mcg total) by mouth daily before breakfast. (Patient not taking: Reported on 02/23/2021) 30 capsule 0   pantoprazole (PROTONIX) 40 MG tablet Take 1 tablet (40 mg total) by mouth daily. 90 tablet 0   polyethylene glycol (MIRALAX / GLYCOLAX) 17 g packet Take 17 g by mouth 2 (two) times daily as needed for mild constipation. 14 each 0   QUEtiapine (SEROQUEL) 400 MG tablet Take 1 tablet (400 mg total) by mouth at bedtime. 30 tablet 3   senna-docusate (SENOKOT-S) 8.6-50 MG tablet Take 1 tablet by mouth 2 (two) times daily.     sucralfate (CARAFATE) 1 g tablet Take 1 tablet (1 g total) by mouth 4 (four) times daily -  with meals and at bedtime. 90 tablet 0   valACYclovir (VALTREX) 1000 MG tablet Take 1,000 mg by mouth See admin instructions. Take 1 tablet by mouth three times daily ONLY when having an active break out     No current facility-administered medications for this visit.     Musculoskeletal: Strength & Muscle Tone: decreased Gait & Station: unsteady, Patient notes that she uses a walker Patient  leans: N/A  Psychiatric Specialty Exam: Review of Systems  There were no vitals taken for this visit.There is  no height or weight on file to calculate BMI.  General Appearance: Well Groomed  Eye Contact:  Good  Speech:  Clear and Coherent and Normal Rate  Volume:  Normal  Mood:  Anxious and Depressed  Affect:  Appropriate and Congruent  Thought Process:  Coherent, Goal Directed, and Linear  Orientation:  Full (Time, Place, and Person)  Thought Content: Logical and Hallucinations: Visual   Suicidal Thoughts:  Yes.  without intent/plan  Homicidal Thoughts:  No  Memory:  Immediate;   Good Recent;   Good Remote;   Good  Judgement:  Good  Insight:  Good  Psychomotor Activity:  Normal  Concentration:  Concentration: Good and Attention Span: Good  Recall:  Good  Fund of Knowledge: Good  Language: Good  Akathisia:  No  Handed:  Right  AIMS (if indicated):  done, 1  Assets:  Communication Skills Desire for Improvement Financial Resources/Insurance Housing Leisure Time Physical Health Social Support  ADL's:  Intact  Cognition: WNL  Sleep:  Fair   Screenings: GAD-7    Flowsheet Row Video Visit from 03/05/2021 in [facility] Video Visit from 01/11/2021 in [facility] Office Visit from 11/07/2020 in [place] Video Visit from 10/10/2020 in [facility] Video Visit from 07/13/2020 in [facility]  Total GAD-7 Score 21 21 15 10 3       PHQ2-9    Flowsheet Row Video Visit from 03/05/2021 in [facility] Video Visit from 01/11/2021 in [facility] Office Visit from 11/07/2020 in [place] Office Visit from 10/31/2020 in [facility] Video Visit from 10/10/2020 in [facility]  PHQ-2 Total Score 6 6 4 5 6    PHQ-9 Total Score 22 23 19 16 22       Flowsheet Row Video Visit from 03/05/2021 in [facility] Admission (Discharged) from 02/12/2021 in [place] Video Visit from 01/11/2021 in [place] Error: Q7 should not be populated when Q6 is No No Risk Error: Q7 should not be populated when Q6 is No        Assessment and Plan: Patient endorses symptoms of  depression, anxiety, and insomnia due to pain pain.  Patient notes that she has been taking Xanax only as needed.  For the last few months patient has only been given a quantity of 30 Xanax.  Xanax reduced from 0.5 mg twice daily to 0.25-0.5 mg daily as needed. At this time no other medications adjusted made at patient's request.  Provider informed patient to discuss with PCP pain management to see if it helps manage anxiety and depression.  Provider discussed starting gabapentin for pain, anxiety, and mood at next visit if psychiatric symptoms does not improve.  Provider also discussed starting Cogentin, Ingrezza, or a startle to help manage rocking if symptoms does not improve.  She endorsed understanding and agreed.  1. Schizoaffective disorder, bipolar type ([place])  Continue- citaloram (CELEXA) 20 MG tablet; Take 1 tablet (20 mg total) by mouth daily.  Dispense: 30 tablet; Refill: 3 Increase- QUEtiapine (SEROQUEL) 400 MG tablet; Take 1 tablet (400 mg total) by mouth at bedtime.  Dispense: 30 tablet; Refill: 3  2. Generalized anxiety disorder  Continue- ALPRAZolam (XANAX) 0.5 MG tablet; Take 1 tablet (0.5 mg total) by mouth 3 (three) times daily as needed for anxiety.  Dispense: 30 tablet; Refill: 0  Follow-up in 3 months Salley Slaughter, NP 03/05/2021, 3:48 PM

## 2021-03-09 ENCOUNTER — Encounter: Payer: Self-pay | Admitting: Gastroenterology

## 2021-03-09 ENCOUNTER — Other Ambulatory Visit: Payer: Self-pay

## 2021-03-09 ENCOUNTER — Other Ambulatory Visit (HOSPITAL_COMMUNITY): Payer: Self-pay

## 2021-03-09 ENCOUNTER — Ambulatory Visit (AMBULATORY_SURGERY_CENTER): Payer: Medicaid Other | Admitting: Gastroenterology

## 2021-03-09 VITALS — BP 134/86 | HR 88 | Temp 97.7°F | Resp 17 | Ht 65.0 in | Wt 200.0 lb

## 2021-03-09 DIAGNOSIS — K2100 Gastro-esophageal reflux disease with esophagitis, without bleeding: Secondary | ICD-10-CM

## 2021-03-09 DIAGNOSIS — K2970 Gastritis, unspecified, without bleeding: Secondary | ICD-10-CM | POA: Diagnosis not present

## 2021-03-09 DIAGNOSIS — Z1211 Encounter for screening for malignant neoplasm of colon: Secondary | ICD-10-CM

## 2021-03-09 DIAGNOSIS — D122 Benign neoplasm of ascending colon: Secondary | ICD-10-CM

## 2021-03-09 DIAGNOSIS — K219 Gastro-esophageal reflux disease without esophagitis: Secondary | ICD-10-CM | POA: Diagnosis not present

## 2021-03-09 DIAGNOSIS — R1030 Lower abdominal pain, unspecified: Secondary | ICD-10-CM

## 2021-03-09 DIAGNOSIS — D124 Benign neoplasm of descending colon: Secondary | ICD-10-CM

## 2021-03-09 DIAGNOSIS — K2101 Gastro-esophageal reflux disease with esophagitis, with bleeding: Secondary | ICD-10-CM

## 2021-03-09 HISTORY — PX: COLONOSCOPY WITH ESOPHAGOGASTRODUODENOSCOPY (EGD): SHX5779

## 2021-03-09 MED ORDER — SODIUM CHLORIDE 0.9 % IV SOLN: 500.0000 mL | Freq: Once | INTRAVENOUS | Status: DC

## 2021-03-09 MED ORDER — PANTOPRAZOLE SODIUM 40 MG PO TBEC
DELAYED_RELEASE_TABLET | ORAL | 3 refills | Status: DC
Start: 2021-03-09 — End: 2021-12-10
  Filled 2021-03-09: qty 60, 30d supply, fill #0
  Filled 2021-05-26: qty 60, 30d supply, fill #1
  Filled 2021-08-05 – 2021-08-10 (×2): qty 60, 30d supply, fill #2
  Filled 2021-10-08: qty 60, 30d supply, fill #3

## 2021-03-09 NOTE — Progress Notes (Signed)
Referring Provider: Dorethea Clan, DO Primary Care Physician:  Dorethea Clan, DO  Reason for Procedure:  Colon cancer screening   IMPRESSION:  Left-sided abdominal pain that improves with defecation    - not explained by CT scan    - ? Constipation, diverticulosis, SCAD, IBS    - maximize supportive care    - colonoscopy for further evaluation    - continue to work to obtain prior colonoscopy report LA Class D reflux esophagitis with ulceration on EGD 05/2020    - symptoms of heartburn improved on PPI therapy Tortuous colon with history of incomplete colonoscopy in [place] ? 7 years ago    - per patient report    - working to obtain prior colonoscopy report Moderate to severe left-sided diverticulosis noted on CT scan HIV with an undetectable viral load on [facility] Appropriate candidate for monitored anesthesia care  PLAN: [place] and [place]   HPI: Mackenzie Gonzalez is a 61 y.o. female presents for screening colonoscopy and EGD to follow-up on abdominal pain.    Past Medical History:  Diagnosis Date   Anxiety    Bipolar 1 disorder ([place])    Cancer ([place])    Chronic bronchitis ([place])    GERD (gastroesophageal reflux disease)    Hepatitis C carrier ([place])    HIV (human immunodeficiency virus infection) ([place]) 2015   HIV (human immunodeficiency virus infection) ([place])    Psychiatric disorder    reported h/o schizoaffective, bipolar, anxiety   Schizoaffective disorder ([place])    Stroke [facility])     Past Surgical History:  Procedure Laterality Date   BIOPSY  06/18/2020   Procedure: BIOPSY;  Surgeon: [name], MD;  Location: [place];  Service: Gastroenterology;;   BREAST EXCISIONAL BIOPSY Left 1987   benign   ESOPHAGOGASTRODUODENOSCOPY (EGD) WITH PROPOFOL N/A 06/18/2020   Procedure: ESOPHAGOGASTRODUODENOSCOPY (EGD) WITH PROPOFOL;  Surgeon: [name], MD;  Location: [place];  Service: Gastroenterology;  Laterality: N/A;   HARDWARE REMOVAL  Right 02/12/2021   Procedure: Right hand metacarpal deep implant removal;  Surgeon: [name], MD;  Location: [place];  Service: Orthopedics;  Laterality: Right;  with IV sedation   I & D EXTREMITY Right 06/16/2020   Procedure: Open debridement of skin subcutaneous tissue and bone associated with open grade 2 fracture right hand;Radiographs 3 views right hand Complex wound closure degloving injury dorsal aspect of the hand greater than 10 cm. Open reduction and internal fixation right small finger metacarpal shaft ;  Surgeon: [name], MD;  Location: [place];  Service: Orthopedics;  Laterality: Right;   ORIF WRIST FRACTURE Left 06/16/2020   Procedure: Open reduction internal fixation displaced intra-articular distal radius fracture left wrist 3 more fragments; Radiographs 3 views left wrist Left wrist brachioadialis tendon release, tendon tenotomy;  Surgeon: [name], MD;  Location: [place];  Service: Orthopedics;  Laterality: Left;   SP ARTHRO WRIST*R*     WRIST SURGERY      Current Outpatient Medications  Medication Sig Dispense Refill   ALPRAZolam (XANAX) 0.5 MG tablet Take 1/2 to 1 tablet by mouth daily. 30 tablet 2   aspirin 81 MG chewable tablet Chew 1 tablet (81 mg total) by mouth daily.     bictegravir-emtricitabine-tenofovir AF (BIKTARVY) 50-200-25 MG TABS tablet Take 1 tablet by mouth daily. 90 tablet 0   citalopram (CELEXA) 20 MG tablet Take 1 tablet (20 mg total) by mouth daily. 30 tablet 3   oxyCODONE-acetaminophen (PERCOCET)  10-325 MG tablet Take 1 tablet by mouth every 6 (six) hours as needed for pain.     pantoprazole (PROTONIX) 40 MG tablet Take 1 tablet (40 mg total) by mouth daily. 90 tablet 0   polyethylene glycol (MIRALAX / GLYCOLAX) 17 g packet Take 17 g by mouth 2 (two) times daily as needed for mild constipation. 14 each 0   QUEtiapine (SEROQUEL) 400 MG tablet Take 1 tablet (400 mg total) by mouth at bedtime. 30 tablet 3   sucralfate (CARAFATE) 1 g  tablet Take 1 tablet (1 g total) by mouth 4 (four) times daily -  with meals and at bedtime. 90 tablet 0   albuterol (VENTOLIN HFA) 108 (90 Base) MCG/ACT inhaler Inhale 2 puffs into the lungs every 6 (six) hours as needed for wheezing or shortness of breath. 8.5 g 6   atorvastatin (LIPITOR) 80 MG tablet Take 1 tablet (80 mg total) by mouth daily. 90 tablet 0   cyanocobalamin 1000 MCG tablet Take 1 tablet (1,000 mcg total) by mouth daily. (Patient not taking: Reported on 02/23/2021) 30 tablet 0   linaclotide (LINZESS) 145 MCG CAPS capsule Take 1 capsule (145 mcg total) by mouth daily before breakfast. (Patient not taking: Reported on 02/23/2021) 30 capsule 0   senna-docusate (SENOKOT-S) 8.6-50 MG tablet Take 1 tablet by mouth 2 (two) times daily.     valACYclovir (VALTREX) 1000 MG tablet Take 1,000 mg by mouth See admin instructions. Take 1 tablet by mouth three times daily ONLY when having an active break out     Current Facility-Administered Medications  Medication Dose Route Frequency Provider Last Rate Last Admin   0.9 %  sodium chloride infusion  500 mL Intravenous Once [name], MD        Allergies as of 03/09/2021 - Review Complete 03/09/2021  Allergen Reaction Noted   Haldol [haloperidol] Other (See Comments) 06/17/2020    Family History  Problem Relation Age of Onset   Psychiatric Illness Mother    Hepatitis Father    Alcohol abuse Father    Breast cancer Paternal Aunt    Colon cancer Neg Hx    Stomach cancer Neg Hx    Colon polyps Neg Hx    Esophageal cancer Neg Hx    Rectal cancer Neg Hx      Physical Exam: General:   Alert,  well-nourished, pleasant and cooperative in NAD Head:  Normocephalic and atraumatic. Eyes:  Sclera clear, no icterus.   Conjunctiva pink. Mouth:  No deformity or lesions.   Neck:  Supple; no masses or thyromegaly. Lungs:  Clear throughout to auscultation.   No wheezes. Heart:  Regular rate and rhythm; no murmurs. Abdomen:  Soft,  non-tender, nondistended, normal bowel sounds, no rebound or guarding.  Msk:  Symmetrical. No boney deformities LAD: No inguinal or umbilical LAD Extremities:  No clubbing or edema. Neurologic:  Alert and  oriented x4;  grossly nonfocal Skin:  No obvious rash or bruise. Psych:  Alert and cooperative. Normal mood and affect.     Studies/Results: No results found.    Shatana Saxton L. Tarri Glenn, MD, MPH 03/09/2021, 1:20 PM

## 2021-03-09 NOTE — Op Note (Signed)
[place] Patient Name: Mackenzie Gonzalez Procedure Date: 03/09/2021 2:10 PM MRN: [id number] Endoscopist: [name] MD, MD Age: 61 Referring MD:  Date of Birth: January 29, 1960 Gender: Female Account #: [id number] Procedure:                Colonoscopy Indications:              Screening for colorectal malignant neoplasm                           History of incomplete colonoscopy due to a tortuous                            colon in [place] many years ago                           No known family history of colon cancer or polyps Medicines:                Monitored Anesthesia Care Procedure:                Pre-Anesthesia Assessment:                           - Prior to the procedure, a History and Physical                            was performed, and patient medications and                            allergies were reviewed. The patient's tolerance of                            previous anesthesia was also reviewed. The risks                            and benefits of the procedure and the sedation                            options and risks were discussed with the patient.                            All questions were answered, and informed consent                            was obtained. Prior Anticoagulants: The patient has                            taken no previous anticoagulant or antiplatelet                            agents. ASA Grade Assessment: III - A patient with                            severe systemic disease. After reviewing the risks  and benefits, the patient was deemed in                            satisfactory condition to undergo the procedure.                           After obtaining informed consent, the colonoscope                            was passed under direct vision. Throughout the                            procedure, the patient's blood pressure, pulse, and                            oxygen saturations were  monitored continuously. The                            CF [id number] #[id number] was introduced through the anus                            and advanced to the 3 cm into the ileum. A second                            forward view of the right colon was performed. The                            colonoscopy was performed without difficulty. The                            patient tolerated the procedure well. The quality                            of the bowel preparation was good. The terminal                            ileum, ileocecal valve, appendiceal orifice, and                            rectum were photographed. Scope In: 2:30:18 PM Scope Out: 2:51:50 PM Scope Withdrawal Time: 0 hours 16 minutes 26 seconds  Total Procedure Duration: 0 hours 21 minutes 32 seconds  Findings:                 The perianal and digital rectal examinations were                            normal except for bulky hemorrhoids.                           Non-bleeding internal hemorrhoids were found.                           Multiple small and large-mouthed diverticula were  found in the sigmoid colon and descending colon.                           A 3 mm polyp was found in the descending colon. The                            polyp was sessile. The polyp was removed with a                            cold snare. Resection and retrieval were complete.                            Estimated blood loss was minimal.                           Two sessile polyps were found in the ascending                            colon. The polyps were 3-4 mm in size. These polyps                            were removed with a cold snare. Resection and                            retrieval were complete. Estimated blood loss was                            minimal.                           The exam was otherwise without abnormality on                            direct and retroflexion views. Complications:             No immediate complications. Estimated blood loss:                            Minimal. Estimated Blood Loss:     Estimated blood loss was minimal. Impression:               - Non-bleeding internal and external hemorrhoids.                           - Diverticulosis in the sigmoid colon and in the                            descending colon.                           - One 3 mm polyp in the descending colon, removed                            with a cold snare. Resected and retrieved.                           -  Two 3-4 mm polyps in the ascending colon, removed                            with a cold snare. Resected and retrieved.                           - The examination was otherwise normal on direct                            and retroflexion views. Recommendation:           - Patient has a contact number available for                            emergencies. The signs and symptoms of potential                            delayed complications were discussed with the                            patient. Return to normal activities tomorrow.                            Written discharge instructions were provided to the                            patient.                           - Resume previous diet.                           - Continue present medications.                           - Await pathology results.                           - Repeat colonoscopy date to be determined after                            pending pathology results are reviewed for                            surveillance.                           - Follow a high fiber diet. Drink at least 64                            ounces of water daily. Add a daily stool bulking                            agent such as psyllium (an exampled would be  Metamucil).                           - Emerging evidence supports eating a diet of                            fruits, vegetables, grains, calcium, and  yogurt                            while reducing red meat and alcohol may reduce the                            risk of colon cancer.                           - Thank you for allowing me to be involved in your                            colon cancer prevention. [name] MD, MD 03/09/2021 2:57:19 PM This report has been signed electronically.

## 2021-03-09 NOTE — Progress Notes (Signed)
To pacu, VSS. Report to [email or web address].tb 

## 2021-03-09 NOTE — Progress Notes (Signed)
Pt's states no medical or surgical changes since previsit or office visit. VS by CW. 

## 2021-03-09 NOTE — Progress Notes (Signed)
Called to room to assist during endoscopic procedure.  Patient ID and intended procedure confirmed with present staff. Received instructions for my participation in the procedure from the performing physician.  

## 2021-03-09 NOTE — Op Note (Signed)
[facility] Patient Name: Mackenzie Gonzalez Procedure Date: 03/09/2021 2:11 PM MRN: [id number] Endoscopist: [name] MD, MD Age: 61 Referring MD:  Date of Birth: 1960/10/31 Gender: Female Account #: [id number] Procedure:                Upper GI endoscopy Indications:              Abdominal pain Medicines:                Monitored Anesthesia Care Procedure:                Pre-Anesthesia Assessment:                           - Prior to the procedure, a History and Physical                            was performed, and patient medications and                            allergies were reviewed. The patient's tolerance of                            previous anesthesia was also reviewed. The risks                            and benefits of the procedure and the sedation                            options and risks were discussed with the patient.                            All questions were answered, and informed consent                            was obtained. Prior Anticoagulants: The patient has                            taken no previous anticoagulant or antiplatelet                            agents. ASA Grade Assessment: II - A patient with                            mild systemic disease. After reviewing the risks                            and benefits, the patient was deemed in                            satisfactory condition to undergo the procedure.                           After obtaining informed consent, the endoscope was  passed under direct vision. Throughout the                            procedure, the patient's blood pressure, pulse, and                            oxygen saturations were monitored continuously. The                            Endoscope was introduced through the mouth, and                            advanced to the third part of duodenum. The upper                            GI endoscopy was accomplished without  difficulty.                            The patient tolerated the procedure well. Scope In: Scope Out: Findings:                 LA Grade A (one or more mucosal breaks less than 5                            mm, not extending between tops of 2 mucosal folds)                            esophagitis with no bleeding was found 36 cm from                            the incisors. Biopsies were taken from the mid                            esophagus and distal esophagus with a cold forceps                            for histology. Estimated blood loss was minimal.                           Localized moderate inflammation characterized by                            erythema, friability and shallow ulcerations was                            found in the gastric fundus. Biopsies were taken                            from the antrum, body, and fundus with a cold                            forceps for histology. Estimated blood loss was  minimal.                           The examined duodenum was normal.                           The cardia and gastric fundus were normal on                            retroflexion.                           The exam was otherwise without abnormality. Complications:            No immediate complications. Estimated blood loss:                            Minimal. Estimated Blood Loss:     Estimated blood loss was minimal. Impression:               - LA Grade A reflux esophagitis with no bleeding.                            Biopsied.                           - Gastritis. Biopsied.                           - Normal examined duodenum.                           - The examination was otherwise normal. Recommendation:           - Patient has a contact number available for                            emergencies. The signs and symptoms of potential                            delayed complications were discussed with the                             patient. Return to normal activities tomorrow.                            Written discharge instructions were provided to the                            patient.                           - Resume previous diet.                           - Continue present medications.                           - Increase pantoprazole to 40  mg BID.                           - Await pathology results.                           - No aspirin, ibuprofen, naproxen, or other                            non-steroidal anti-inflammatory drugs. [name] MD, MD 03/09/2021 2:28:19 PM This report has been signed electronically.

## 2021-03-09 NOTE — Patient Instructions (Addendum)
UPPER ENDOSCOPY :   NO ASPIRIN, ASPIRIN CONTAINING PRODUCTS (BC OR GOODY POWDERS) OR NSAIDS (IBUPROFEN, ADVIL, ALEVE, AND MOTRIN) FOR ; TYLENOL IS OK TO TAKE   Increase Pantoprazole to 40 mg twice daily  Handout on gastritis given to you today  Await pathology results   COLONOSCOPY:  Handouts on polyps ,diverticulosis,& hemorrhoids given to you today  High fiber diet information given to you today  Follow high fiber diet ,drink 64 ounces water daily   Take fiber supplement such as Metamucil 9 see procedure report)  Await pathology results on polyps removed today    YOU HAD AN ENDOSCOPIC PROCEDURE TODAY AT [facility]:   Refer to the procedure report that was given to you for any specific questions about what was found during the examination.  If the procedure report does not answer your questions, please call your gastroenterologist to clarify.  If you requested that your care partner not be given the details of your procedure findings, then the procedure report has been included in a sealed envelope for you to review at your convenience later.  YOU SHOULD EXPECT: Some feelings of bloating in the abdomen. Passage of more gas than usual.  Walking can help get rid of the air that was put into your GI tract during the procedure and reduce the bloating. If you had a lower endoscopy (such as a colonoscopy or flexible sigmoidoscopy) you may notice spotting of blood in your stool or on the toilet paper. If you underwent a bowel prep for your procedure, you may not have a normal bowel movement for a few days.  Please Note:  You might notice some irritation and congestion in your nose or some drainage.  This is from the oxygen used during your procedure.  There is no need for concern and it should clear up in a day or so.  SYMPTOMS TO REPORT IMMEDIATELY:  Following lower endoscopy (colonoscopy or flexible sigmoidoscopy):  Excessive amounts of blood in the  stool  Significant tenderness or worsening of abdominal pains  Swelling of the abdomen that is new, acute  Fever of 100F or higher  Following upper endoscopy (EGD)  Vomiting of blood or coffee ground material  New chest pain or pain under the shoulder blades  Painful or persistently difficult swallowing  New shortness of breath  Fever of 100F or higher  Black, tarry-looking stools  For urgent or emergent issues, a gastroenterologist can be reached at any hour by calling [phone]. Do not use MyChart messaging for urgent concerns.    DIET:  We do recommend a small meal at first, but then you may proceed to your regular diet.  Drink plenty of fluids but you should avoid alcoholic beverages for 24 hours.  ACTIVITY:  You should plan to take it easy for the rest of today and you should NOT DRIVE or use heavy machinery until tomorrow (because of the sedation medicines used during the test).    FOLLOW UP: Our staff will call the number listed on your records 48-72 hours following your procedure to check on you and address any questions or concerns that you may have regarding the information given to you following your procedure. If we do not reach you, we will leave a message.  We will attempt to reach you two times.  During this call, we will ask if you have developed any symptoms of COVID 19. If you develop any symptoms (ie: fever, flu-like symptoms, shortness of breath, cough  etc.) before then, please call [phone].  If you test positive for Covid 19 in the 2 weeks post procedure, please call and report this information to [place].    If any biopsies were taken you will be contacted by phone or by letter within the next 1-3 weeks.  Please call us at [phone] if you have not heard about the biopsies in 3 weeks.    SIGNATURES/CONFIDENTIALITY: You and/or your care partner have signed paperwork which will be entered into your electronic medical record.  These signatures attest to the  fact that that the information above on your After Visit Summary has been reviewed and is understood.  Full responsibility of the confidentiality of this discharge information lies with you and/or your care-partner.

## 2021-03-13 ENCOUNTER — Telehealth: Payer: Self-pay

## 2021-03-13 NOTE — Telephone Encounter (Signed)
°  Follow up Call-  Call back number 03/09/2021  Post procedure Call Back phone  # [phone]  Permission to leave phone message Yes  Some recent data might be hidden     Patient questions:  Do you have a fever, pain , or abdominal swelling? No. Pain Score  0 *  Have you tolerated food without any problems? Yes.    Have you been able to return to your normal activities? Yes.    Do you have any questions about your discharge instructions: Diet   No. Medications  No. Follow up visit  No.  Do you have questions or concerns about your Care? No.  Actions: * If pain score is 4 or above: No action needed, pain <4.  Have you developed a fever since your procedure? no  2.   Have you had an respiratory symptoms (SOB or cough) since your procedure? no  3.   Have you tested positive for COVID 19 since your procedure no  4.   Have you had any family members/close contacts diagnosed with the COVID 19 since your procedure?  no   If yes to any of these questions please route to Joylene John, RN and Joella Prince, RN

## 2021-03-20 ENCOUNTER — Encounter: Payer: Self-pay | Admitting: Gastroenterology

## 2021-03-21 ENCOUNTER — Other Ambulatory Visit (HOSPITAL_COMMUNITY): Payer: Self-pay

## 2021-03-23 ENCOUNTER — Other Ambulatory Visit (HOSPITAL_COMMUNITY): Payer: Self-pay

## 2021-03-23 ENCOUNTER — Other Ambulatory Visit (HOSPITAL_COMMUNITY)
Admission: RE | Admit: 2021-03-23 | Discharge: 2021-03-23 | Disposition: A | Discharge status: Home / Self Care | Payer: Medicaid Other | Source: Ambulatory Visit | Attending: Internal Medicine | Admitting: Internal Medicine

## 2021-03-23 ENCOUNTER — Ambulatory Visit: Payer: Medicaid Other | Admitting: Internal Medicine

## 2021-03-23 VITALS — BP 130/86 | HR 81 | Temp 97.7°F | Wt 199.4 lb

## 2021-03-23 DIAGNOSIS — Z8673 Personal history of transient ischemic attack (TIA), and cerebral infarction without residual deficits: Secondary | ICD-10-CM

## 2021-03-23 DIAGNOSIS — M2550 Pain in unspecified joint: Secondary | ICD-10-CM | POA: Diagnosis not present

## 2021-03-23 DIAGNOSIS — E785 Hyperlipidemia, unspecified: Secondary | ICD-10-CM | POA: Diagnosis not present

## 2021-03-23 DIAGNOSIS — D126 Benign neoplasm of colon, unspecified: Secondary | ICD-10-CM

## 2021-03-23 DIAGNOSIS — B20 Human immunodeficiency virus [HIV] disease: Secondary | ICD-10-CM | POA: Diagnosis not present

## 2021-03-23 DIAGNOSIS — Z0000 Encounter for general adult medical examination without abnormal findings: Secondary | ICD-10-CM

## 2021-03-23 DIAGNOSIS — Z124 Encounter for screening for malignant neoplasm of cervix: Secondary | ICD-10-CM | POA: Diagnosis present

## 2021-03-23 DIAGNOSIS — R5383 Other fatigue: Secondary | ICD-10-CM | POA: Diagnosis not present

## 2021-03-23 DIAGNOSIS — N393 Stress incontinence (female) (male): Secondary | ICD-10-CM

## 2021-03-23 MED ORDER — ATORVASTATIN CALCIUM 80 MG PO TABS
80.0000 mg | ORAL_TABLET | Freq: Every day | ORAL | 1 refills | Status: DC
  Filled 2021-03-23: qty 90, 90d supply, fill #0
  Filled 2021-06-16: qty 90, 90d supply, fill #1

## 2021-03-23 MED ORDER — DULOXETINE HCL 30 MG PO CPEP
30.0000 mg | ORAL_CAPSULE | Freq: Every day | ORAL | 0 refills | Status: DC
  Filled 2021-03-23: qty 90, 90d supply, fill #0

## 2021-03-23 NOTE — Patient Instructions (Signed)
Thank you, [name] for allowing [place] to provide your care today. Today we discussed:  Pap smear: I will call you with the results of your pap smear  Fatigue/weight gain: We are checking your thyroid level today. I have also referred you to physical therapy and to an exercise program  Urine leakage: Start doing some of the kegel exercises that are attached to this paperwork. Building up strength in your pelvic floor muscles should help this  Pain: We switched your celexa (mood medicine) to duloxetine 30 mg. This medicine will also help your depression, but should help the pain you are having as well. You can also take Tylenol arthritis (up to 2 tablets twice a day).  Cholesterol: Keep taking atorvastatin 80 mg everyday. I sent this refill to your pharmacy. This will help lower your cholesterol to reduce your risk of another stroke  I have ordered the following labs for you:   Lab Orders         TSH      Tests ordered today:  none  Referrals ordered today:    Referral Orders         Amb Referral To Provider Referral Exercise Program (P.R.E.P)         Ambulatory referral to Physical Therapy      I have ordered the following medication/changed the following medications:   Stop the following medications: Medications Discontinued During This Encounter  Medication Reason   atorvastatin (LIPITOR) 80 MG tablet Reorder   citalopram (CELEXA) 20 MG tablet      Start the following medications: Meds ordered this encounter  Medications   atorvastatin (LIPITOR) 80 MG tablet    Sig: Take 1 tablet (80 mg total) by mouth daily.    Dispense:  90 tablet    Refill:  1   DULoxetine (CYMBALTA) 30 MG capsule    Sig: Take 1 capsule (30 mg total) by mouth daily.    Dispense:  90 capsule    Refill:  0     Follow up: 3 months    Remember: To STOP taking celexa and start Duloxetine/Cymbalta instead.  Should you have any questions or concerns please call the internal medicine clinic  at [phone].     Mackenzie Gonzalez, D.O. [place]

## 2021-03-23 NOTE — Assessment & Plan Note (Signed)
Patient reports that for the last few years, she has urine leakage, specifically after coughing or sneezing. This has become distressing and embarrassing to the patient over this time period. She had 1 pregnancy, delivered via C section, and no history of vaginal deliveries. She denies any history of prolapse or noticing any other structural abnormalities.   Symptoms are consistent with stress incontinence. Provided patient with instructions on kegel exercises to strengthen her pelvic floor muscles.

## 2021-03-23 NOTE — Assessment & Plan Note (Signed)
Patient complains of pain in multiple joints, including her bilateral wrists, shoulders, hips, and low back. She states this has been going on for years. She denies any recent injury and notes that she has not taken anything for her pain. She was previously given a short prescription of percocet after she had hand surgery in January, and this helped her pain, but the surgeons did not refill it for her.  Discussed starting the patient on duloxetine 30 mg daily (will discontinue celexa and replace with this) to see if this helps her pain. Also discussed referral to physical therapy and PREP/exercise program.   Plan: - Discontinue celexa - Start duloxetine 30 mg daily, can uptitrate - Referral to physical therapy - Referral to physician exercise program

## 2021-03-23 NOTE — Progress Notes (Signed)
° °  CC: pap smear  HPI:  [name] is a 61 y.o. female with HIV and previous CVA who presents to the [facility] for a pap smear and to discuss fatigue. Please see problem-based list for further details, assessments, and plans.   Past Medical History:  Diagnosis Date   Anxiety    Bipolar 1 disorder ([place])    Cancer ([place])    Chronic bronchitis ([place])    GERD (gastroesophageal reflux disease)    Hepatitis C carrier ([facility])    HIV (human immunodeficiency virus infection) ([place]) 2015   HIV (human immunodeficiency virus infection) ([place])    Psychiatric disorder    reported h/o schizoaffective, bipolar, anxiety   Schizoaffective disorder ([place])    Stroke ([place])    Review of Systems:  Review of Systems  Constitutional:  Positive for malaise/fatigue. Negative for chills and fever.  HENT:  Negative for congestion and sore throat.   Eyes: Negative.   Respiratory:  Negative for cough and shortness of breath.   Cardiovascular:  Negative for chest pain and palpitations.  Gastrointestinal:  Positive for constipation. Negative for blood in stool, diarrhea, nausea and vomiting.  Genitourinary:        Urinary incontinence   Musculoskeletal:  Positive for myalgias.  Neurological:  Negative for dizziness and headaches.  Endo/Heme/Allergies:        Cold intolerance 20 lb weight gain (in 6 months) Fatigue    Physical Exam:  Vitals:   03/23/21 1018  BP: 130/86  Pulse: 81  Temp: 97.7 F (36.5 C)  TempSrc: Oral  SpO2: 99%  Weight: 199 lb 6.4 oz (90.4 kg)   General: Pleasant female, appears older than stated age. No acute distress. CV: RRR. No murmurs. No LE edema Pulmonary: Lungs CTAB. Normal effort.  Abdominal: Soft, nontender, nondistended.  Skin: Warm and dry. No obvious rash or lesions. Neuro: A&Ox3. Moves all extremities. Normal sensation. No focal deficit. Psych: Normal mood and affect Pelvic: Normal external genitalia with no external lesions. Vagina and cervix with no lesions or  discharge.    Assessment & Plan:   See Encounters Tab for problem based charting.  Patient discussed with Dr. Jimmye Norman

## 2021-03-23 NOTE — Assessment & Plan Note (Addendum)
Last LDL 164. Patient is supposed to be on atorvastatin 80 mg daily for her hyperlipidemia. She also has a history of a previous CVA. The patient is unsure if she has this medicine at home, thus will refill it today. Stressed the importance of good cholesterol control with her history of ischemic events. Recheck lipid profile at next visit.

## 2021-03-23 NOTE — Assessment & Plan Note (Signed)
Patient presents today with worsening fatigue/tiredness, 20 lbs weight gain in 6 months, constipation, and cold intolerance. She also has a history of depression. Denies any dry/scaly skin, brittle hair/nails. She has never had a history of thyroid disorders. Will check TSH today to rule out hypothyroidism.  Plan: - TSH

## 2021-03-23 NOTE — Assessment & Plan Note (Signed)
Patient had screening colonoscopy on 2/10 and was found to have one 3 mm polyp in the descending colon, removed with a cold snare. Two 3-4 mm polyps were found in the ascending colon, removed with a cold snare.  Surgical pathology negative for malignancy, consistent with tubular adenomas. Next colonoscopy recommended to be done in February 2026.

## 2021-03-23 NOTE — Assessment & Plan Note (Signed)
Pap smear performed today, will call patient with results.   Colonoscopy performed on 03/09/21, next one due in 2026.

## 2021-03-23 NOTE — Assessment & Plan Note (Signed)
>>  ASSESSMENT AND PLAN FOR STRESS INCONTINENCE WRITTEN ON 03/23/2021 11:39 AM BY ATWAY, RAYANN N, DO  Patient reports that for the last few years, she has urine leakage, specifically after coughing or sneezing. This has become distressing and embarrassing to the patient over this time period. She had 1 pregnancy, delivered via C section, and no history of vaginal deliveries. She denies any history of prolapse or noticing any other structural abnormalities.   Symptoms are consistent with stress incontinence. Provided patient with instructions on kegel exercises to strengthen her pelvic floor muscles.

## 2021-03-24 LAB — TSH: TSH: 1.57 u[IU]/mL (ref 0.450–4.500)

## 2021-03-27 LAB — CYTOLOGY - PAP
Adequacy: ABSENT
Comment: NEGATIVE
Diagnosis: NEGATIVE
High risk HPV: NEGATIVE

## 2021-03-30 ENCOUNTER — Telehealth: Payer: Self-pay

## 2021-03-30 ENCOUNTER — Other Ambulatory Visit (HOSPITAL_COMMUNITY): Payer: Self-pay

## 2021-03-30 NOTE — Telephone Encounter (Signed)
Called to discuss PREP program, wants to attend, prefers [facility]  ?

## 2021-04-02 ENCOUNTER — Other Ambulatory Visit (HOSPITAL_COMMUNITY): Payer: Self-pay

## 2021-04-03 ENCOUNTER — Other Ambulatory Visit: Payer: Self-pay

## 2021-04-03 ENCOUNTER — Other Ambulatory Visit (HOSPITAL_COMMUNITY): Payer: Self-pay

## 2021-04-03 NOTE — Progress Notes (Signed)
Internal Medicine Clinic Attending ° °Case discussed with Dr. Atway  At the time of the visit.  We reviewed the resident’s history and exam and pertinent patient test results.  I agree with the assessment, diagnosis, and plan of care documented in the resident’s note.  °

## 2021-04-04 ENCOUNTER — Other Ambulatory Visit (HOSPITAL_COMMUNITY): Payer: Self-pay

## 2021-04-06 ENCOUNTER — Other Ambulatory Visit: Payer: Self-pay

## 2021-04-06 ENCOUNTER — Other Ambulatory Visit (HOSPITAL_COMMUNITY): Payer: Self-pay

## 2021-04-08 ENCOUNTER — Other Ambulatory Visit (HOSPITAL_COMMUNITY): Payer: Self-pay

## 2021-04-09 ENCOUNTER — Other Ambulatory Visit (HOSPITAL_COMMUNITY): Payer: Self-pay

## 2021-04-16 ENCOUNTER — Other Ambulatory Visit (HOSPITAL_COMMUNITY): Payer: Self-pay

## 2021-04-24 ENCOUNTER — Other Ambulatory Visit (HOSPITAL_COMMUNITY): Payer: Self-pay

## 2021-04-26 ENCOUNTER — Other Ambulatory Visit (HOSPITAL_COMMUNITY): Payer: Self-pay

## 2021-04-26 ENCOUNTER — Other Ambulatory Visit: Payer: Self-pay

## 2021-04-27 ENCOUNTER — Other Ambulatory Visit: Payer: Self-pay | Admitting: Internal Medicine

## 2021-04-27 ENCOUNTER — Other Ambulatory Visit (HOSPITAL_COMMUNITY): Payer: Self-pay

## 2021-04-27 DIAGNOSIS — J410 Simple chronic bronchitis: Secondary | ICD-10-CM

## 2021-04-27 MED ORDER — ALBUTEROL SULFATE HFA 108 (90 BASE) MCG/ACT IN AERS
2.0000 | INHALATION_SPRAY | Freq: Four times a day (QID) | RESPIRATORY_TRACT | 6 refills | Status: DC | PRN
  Filled 2021-04-27: qty 18, 25d supply, fill #0
  Filled 2021-06-12: qty 18, 25d supply, fill #1
  Filled 2021-08-05: qty 18, 25d supply, fill #2

## 2021-05-02 ENCOUNTER — Other Ambulatory Visit (HOSPITAL_COMMUNITY): Payer: Self-pay

## 2021-05-03 ENCOUNTER — Ambulatory Visit (HOSPITAL_COMMUNITY): Payer: Medicaid Other | Admitting: Licensed Clinical Social Worker

## 2021-05-03 ENCOUNTER — Telehealth (HOSPITAL_COMMUNITY): Payer: Self-pay | Admitting: Licensed Clinical Social Worker

## 2021-05-03 ENCOUNTER — Encounter (HOSPITAL_COMMUNITY): Payer: Self-pay

## 2021-05-03 NOTE — Telephone Encounter (Signed)
LCSW sent two links to pt phone with no response. LCSW f/u with PC and pt answer. LCSW stated name and identifying license and position at [facility] heath. Pt was pleasant and cooperative. LCSW asked about 1PM appt, pt stated "I cannot do that I am in the car with people right now". LCSW asked for how long because if it was a couple of minutes than LCSW could wait. Pt stated "Few more hours". LCSW provided pt with number to [facility] to reschedule. Moani will be marked as left without being seen for today's visit.  ?

## 2021-05-07 ENCOUNTER — Other Ambulatory Visit (HOSPITAL_COMMUNITY): Payer: Self-pay

## 2021-05-21 ENCOUNTER — Telehealth (HOSPITAL_COMMUNITY): Payer: Medicaid Other | Admitting: Psychiatry

## 2021-05-22 ENCOUNTER — Other Ambulatory Visit: Payer: Self-pay | Admitting: Pharmacist

## 2021-05-22 ENCOUNTER — Ambulatory Visit: Payer: Medicaid Other | Admitting: Adult Health

## 2021-05-22 ENCOUNTER — Encounter: Payer: Self-pay | Admitting: Adult Health

## 2021-05-22 ENCOUNTER — Other Ambulatory Visit (HOSPITAL_COMMUNITY): Payer: Self-pay

## 2021-05-22 VITALS — BP 138/88 | HR 76 | Ht 65.0 in | Wt 195.4 lb

## 2021-05-22 DIAGNOSIS — R269 Unspecified abnormalities of gait and mobility: Secondary | ICD-10-CM

## 2021-05-22 DIAGNOSIS — R2689 Other abnormalities of gait and mobility: Secondary | ICD-10-CM | POA: Diagnosis not present

## 2021-05-22 DIAGNOSIS — I69398 Other sequelae of cerebral infarction: Secondary | ICD-10-CM

## 2021-05-22 DIAGNOSIS — M2550 Pain in unspecified joint: Secondary | ICD-10-CM | POA: Diagnosis not present

## 2021-05-22 DIAGNOSIS — B20 Human immunodeficiency virus [HIV] disease: Secondary | ICD-10-CM

## 2021-05-22 DIAGNOSIS — I63531 Cerebral infarction due to unspecified occlusion or stenosis of right posterior cerebral artery: Secondary | ICD-10-CM | POA: Diagnosis not present

## 2021-05-22 MED ORDER — BIKTARVY 50-200-25 MG PO TABS
1.0000 | ORAL_TABLET | Freq: Every day | ORAL | 0 refills | Status: DC
  Filled 2021-05-24: qty 90, 90d supply, fill #0

## 2021-05-22 NOTE — Patient Instructions (Addendum)
Referral placed to [facility] to help with joint pain and balance  ? ?Continue aspirin 81 mg daily  and atorvastatin '80mg'$  daily  for secondary stroke prevention ? ?Important for complete tobacco cessation as well as avoiding second hand smoke as both greatly increase your risk of recurrent strokes  ? ?Continue to follow up with PCP regarding cholesterol and blood pressure management  ?Maintain strict control of hypertension with blood pressure goal below 130/90 and cholesterol with LDL cholesterol (bad cholesterol) goal below 70 mg/dL.  ? ?Signs of a Stroke? Follow the BEFAST method:  ?Balance Watch for a sudden loss of balance, trouble with coordination or vertigo ?Eyes Is there a sudden loss of vision in one or both eyes? Or double vision?  ?Face: Ask the person to smile. Does one side of the face droop or is it numb?  ?Arms: Ask the person to raise both arms. Does one arm drift downward? Is there weakness or numbness of a leg? ?Speech: Ask the person to repeat a simple phrase. Does the speech sound slurred/strange? Is the person confused ? ?Time: If you observe any of these signs, call 911. ? ? ? ? ? ? ?Thank you for coming to see [place] at [facility]. I hope we have been able to provide you high quality care today. ? ?You may receive a patient satisfaction survey over the next few weeks. We would appreciate your feedback and comments so that we may continue to improve ourselves and the health of our patients. ? ?

## 2021-05-22 NOTE — Progress Notes (Signed)
?[facility] ?[address] ?[place]. [place] [address] ?[phone] ? ?     STROKE FOLLOW UP NOTE ? ?[name] ?Date of Birth:  23-Apr-1960 ?Medical Record Number:  [id number]  ? ?Reason for Referral: stroke follow up ? ? ? ?SUBJECTIVE: ? ? ?CHIEF COMPLAINT:  ?Chief Complaint  ?Patient presents with  ? Follow-up  ?  Rm 2 alone here for 6 month f/u. Pt reports generalized body aches at times but over all doing well.   ? ? ?HPI:  ? ?Update 05/22/2021 JM: Patient returns for 78-monthstroke follow-up with history of right pons and right cerebellum infarct in 08/2020.  She is unaccompanied.  Stable from stroke standpoint without new stroke/TIA symptoms.  Does have some difficulty with left leg at times, will occasionally drag and needs to over lift leg to get in to the car. Does have some issues with balance, will need to use canes at times when not feeling as steady. Unsure if this is due to hip and knee pain. Balance can worsen with prolonged ambulation.  Denies any residual swallowing issues.  Does have pain in multiple joints, PCP referred to PT and exercise program, she declined PT referral [facility]. Continues to live with sister but able to maintain ADLs and IADLs independently. ? ?Compliant on aspirin, denies side effects.  Reports compliance on atorvastatin at this time but at PCP visit in February, patient unsure if taking at home, she was advised to ensure she was taking this with refill provided and plans on obtaining lipid panel at follow-up visit next month.  Blood pressure today initially elevated and on recheck 138/88, was monitoring at home initially but has not been recently monitoring as feeling okay.  Continued tobacco use approx 2 cig/day - is trying to fully quit and decrease amt gradually.  Her sister continues to smoke in the home.  No further  concerns at this time. ? ? ? ? ?History provided for reference purposes only ?Initial visit 11/15/2020 JM: ? Being seen for hospital follow-up unaccompanied.  Overall doing well.  Does c/o residual imbalance, fatigue, occasional swallowing usually with thin liquids and occasional with chewier foods and "slow in my thinking". Usually uses a cane for long distance - not usually needed for short distance. [facility] frustrated as they took a while to come out to see her and "only talked" and didn't do any actual therapy. She wishes to wait to do any additional therapy until she gets her wrist fixed (hx of radial fracture and R fifth metacarpal fracture with degloving injury s/p ORIF 06/16/2020 with revision surgery on hold in setting of recent stroke). Lives with sister - was living with her prior. Able to maintain ADLs and majority of IADLs. Denies new stroke/TIA symptoms.  Completed 3 weeks DAPT -remains on aspirin alone as well as atorvastatin without side effects.  Blood pressure today 128/94 not currently on medication management. Monitors at home - thinks machine is not working correctly as it has been high at home. Cardiac monitor negative for atrial fibrillation. Able to decrease tobacco use but able to decrease amount. Sister also smokes at home which has made quitting difficulty. No further concerns at this time.  ? ? ?Stroke admission 09/17/2020 ?Mackenzie GOTCHERis a 61year old female with history of bipolar, hepatitis C, HIV,  smoker, substance abuse who presented on 09/17/2020 for dizziness, vertigo, left facial droop and left-sided weakness.  CT no acute finding.  CT head and neck no LVO but small posterior circulation including VAs and BA with bilateral PCOMs.  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  EF 55 to 60%, TCD bubble study negative for PFO.  A1c 5.5, LDL 164, UDS positive for THC.  B12 146 in 05/2020, RPR negative in 06/2020.  History of HIV, on HARRT therapy, last viral load  and CD4 in 06/2020 showed no viral load detected and CD4 869.  Etiology for stroke likely small/large vessel disease in setting of hypoplastic posterior circulation and uncontrolled risk factors including tobacco use, substance abuse, HLD and HTN.  No evidence of CNS infection or infectious vasculitis at that time.  However given 2 different locations Cardiologic source cannot be completely ruled out and recommended 30-day cardiac event monitor to rule out atrial fibrillation.  Recommended DAPT for 3 weeks and aspirin alone as well as starting atorvastatin 80 mg daily.  Patient on Genvoya PTA for HIV but discontinued due to interaction with Plavix and started on Biktarvy.  No prior stroke history.  Therapy eval's recommended CIR for residual dysarthria and left-sided weakness. ? ? ? ?PERTINENT IMAGING ? ?MR BRAIN WO CONTRAST 09/18/2020 ?IMPRESSION: ?1. Acute infarcts in the right pons and right cerebellum. Mild ?associated edema without mass effect. ?2. Mild to moderate chronic microvascular ischemic disease. ? ?CTA HEAD/NECK 09/17/2020 ?IMPRESSION: ?No large vessel occlusion, hemodynamically significant stenosis, or ?evidence of dissection. ? ?TCD 09/18/2020 ?Summary:  ?No HITS at rest or during Valsalva. Negative transcranial Doppler Bubble  ?study with no evidence of right to left intracardiac communication.  ? ?2D ECHO 09/18/2020 ?IMPRESSIONS  ? 1. Left ventricular ejection fraction, by estimation, is 55 to 60%. The  ?left ventricle has normal function. The left ventricle has no regional  ?wall motion abnormalities. There is mild left ventricular hypertrophy.  ?Left ventricular diastolic parameters  ?are indeterminate.  ? 2. Right ventricular systolic function is normal. The right ventricular  ?size is normal. There is normal pulmonary artery systolic pressure. The  ?estimated right ventricular systolic pressure is 46.2 mmHg.  ? 3. The mitral valve is normal in structure. No evidence of mitral valve  ?regurgitation.  No evidence of mitral stenosis.  ? 4. The aortic valve was not well visualized. Aortic valve regurgitation  ?is not visualized. No aortic stenosis is present.  ? 5. The inferior vena cava is normal in size with greater than 50%  ?respiratory variability, suggesting right atrial pressure of 3 mmHg.  ? 6. Agitated saline contrast bubble study was negative, with no evidence  ?of any interatrial shunt. Technically difficult bubble study, but no  ?shunting seen  ? ? ? ? ? ? ?ROS:   ?14 system review of systems performed and negative with exception of those listed in HPI ? ?PMH:  ?Past Medical History:  ?Diagnosis Date  ? Anxiety   ? Bipolar 1 disorder ([place])   ? Cancer [facility])   ? Chronic bronchitis ([place])   ? GERD (gastroesophageal reflux disease)   ? Hepatitis C carrier ([place])   ? HIV (human immunodeficiency virus infection) ([place]) 2015  ? HIV (human immunodeficiency virus infection) ([place])   ? Psychiatric disorder   ? reported h/o schizoaffective, bipolar, anxiety  ? Schizoaffective disorder ([place])   ? Stroke [facility])   ? ? ?PSH:  ?Past Surgical History:  ?Procedure Laterality Date  ? BIOPSY  06/18/2020  ?  Procedure: BIOPSY;  Surgeon: [name], MD;  Location: [facility];  Service: Gastroenterology;;  ? BREAST EXCISIONAL BIOPSY Left 1987  ? benign  ? ESOPHAGOGASTRODUODENOSCOPY (EGD) WITH PROPOFOL N/A 06/18/2020  ? Procedure: ESOPHAGOGASTRODUODENOSCOPY (EGD) WITH PROPOFOL;  Surgeon: [name], MD;  Location: [facility];  Service: Gastroenterology;  Laterality: N/A;  ? HARDWARE REMOVAL Right 02/12/2021  ? Procedure: Right hand metacarpal deep implant removal;  Surgeon: [name], MD;  Location: [facility];  Service: Orthopedics;  Laterality: Right;  with IV sedation  ? I & D EXTREMITY Right 06/16/2020  ? Procedure: Open debridement of skin subcutaneous tissue and bone associated with open grade 2 fracture right hand;Radiographs 3 views right hand Complex wound closure degloving injury dorsal  aspect of the hand greater than 10 cm. Open reduction and internal fixation right small finger metacarpal shaft ;  Surgeon: [name], MD;  Location: [facility];  Service: Orthopedics;  Laterality: Right

## 2021-05-24 ENCOUNTER — Ambulatory Visit: Payer: Medicaid Other | Admitting: Internal Medicine

## 2021-05-24 ENCOUNTER — Other Ambulatory Visit (HOSPITAL_COMMUNITY): Payer: Self-pay

## 2021-05-26 ENCOUNTER — Other Ambulatory Visit (HOSPITAL_COMMUNITY): Payer: Self-pay

## 2021-05-29 ENCOUNTER — Encounter: Payer: Self-pay | Admitting: Internal Medicine

## 2021-05-29 ENCOUNTER — Ambulatory Visit (INDEPENDENT_AMBULATORY_CARE_PROVIDER_SITE_OTHER): Payer: Medicaid Other | Admitting: Internal Medicine

## 2021-05-29 ENCOUNTER — Other Ambulatory Visit: Payer: Self-pay

## 2021-05-29 VITALS — BP 123/86 | HR 78 | Temp 98.0°F | Ht 65.0 in | Wt 195.0 lb

## 2021-05-29 DIAGNOSIS — Z7185 Encounter for immunization safety counseling: Secondary | ICD-10-CM | POA: Diagnosis not present

## 2021-05-29 DIAGNOSIS — I6350 Cerebral infarction due to unspecified occlusion or stenosis of unspecified cerebral artery: Secondary | ICD-10-CM | POA: Diagnosis not present

## 2021-05-29 DIAGNOSIS — B20 Human immunodeficiency virus [HIV] disease: Secondary | ICD-10-CM

## 2021-05-29 DIAGNOSIS — Z8619 Personal history of other infectious and parasitic diseases: Secondary | ICD-10-CM | POA: Diagnosis not present

## 2021-05-29 NOTE — Assessment & Plan Note (Signed)
Patient here today for routine follow up.  Previously in care with Dr Johnnye Sima.  Diagnosed in 2016 and previously in care in [place]. She is now established with [facility] since March 2021.  Currently on [place] which is filled through [facility].  She saw Dr Johnnye Sima last in June 2022 and Cassie in January 2023.  Her viral load in January was <20 and she has no issues with taking, tolerating, or obtaining her Biktarvy.  Will continue for now and check labs today.  Follow up in 6 months if labs look okay.  Will also arrange for follow up in our dentistry clinic.  ?

## 2021-05-29 NOTE — Assessment & Plan Note (Signed)
She has history of right pons and right cerebellum infarct in August 2022.  She is currently on ASA and statin followed by neurology and PCP. ?

## 2021-05-29 NOTE — Assessment & Plan Note (Signed)
Recommended PCV 20 and she currently declines.  ?

## 2021-05-29 NOTE — Progress Notes (Signed)
?  ? ? ? ? ?[facility] for Infectious Disease ? ? ?CHIEF COMPLAINT   ? ?HIV follow up ? ?SUBJECTIVE:   ? ?Mackenzie Gonzalez is a 61 y.o. female with PMHx as below who presents to the clinic for HIV follow up.  ? ?Please see A&P for the details of today's visit and status of the patient's medical problems.  ? ?Patient's Medications  ?New Prescriptions  ? No medications on file  ?Previous Medications  ? ALBUTEROL (VENTOLIN HFA) 108 (90 BASE) MCG/ACT INHALER    Inhale 2 puffs into the lungs every 6 (six) hours as needed for wheezing or shortness of breath.  ? ALPRAZOLAM (XANAX) 0.5 MG TABLET    Take 1/2 to 1 tablet by mouth daily.  ? ASPIRIN 81 MG CHEWABLE TABLET    Chew 1 tablet (81 mg total) by mouth daily.  ? ATORVASTATIN (LIPITOR) 80 MG TABLET    Take 1 tablet (80 mg total) by mouth daily.  ? BICTEGRAVIR-EMTRICITABINE-TENOFOVIR AF (BIKTARVY) 50-200-25 MG TABS TABLET    Take 1 tablet by mouth daily.  ? CYANOCOBALAMIN 1000 MCG TABLET    Take 1 tablet (1,000 mcg total) by mouth daily.  ? DULOXETINE (CYMBALTA) 30 MG CAPSULE    Take 1 capsule (30 mg total) by mouth daily.  ? LINACLOTIDE (LINZESS) 145 MCG CAPS CAPSULE    Take 1 capsule (145 mcg total) by mouth daily before breakfast.  ? PANTOPRAZOLE (PROTONIX) 40 MG TABLET    Take 1 tablet by mouth twice a day.  ? POLYETHYLENE GLYCOL (MIRALAX / GLYCOLAX) 17 G PACKET    Take 17 g by mouth 2 (two) times daily as needed for mild constipation.  ? QUETIAPINE (SEROQUEL) 400 MG TABLET    Take 1 tablet (400 mg total) by mouth at bedtime.  ? SENNA-DOCUSATE (SENOKOT-S) 8.6-50 MG TABLET    Take 1 tablet by mouth 2 (two) times daily.  ? SUCRALFATE (CARAFATE) 1 G TABLET    Take 1 tablet (1 g total) by mouth 4 (four) times daily -  with meals and at bedtime.  ? VALACYCLOVIR (VALTREX) 1000 MG TABLET    Take 1,000 mg by mouth See admin instructions. Take 1 tablet by mouth three times daily ONLY when having an active break out  ?Modified Medications  ? No medications on file   ?Discontinued Medications  ? No medications on file  ?   ? ?Past Medical History:  ?Diagnosis Date  ? Anxiety   ? Bipolar 1 disorder ([place])   ? Cancer [facility])   ? Chronic bronchitis ([place])   ? GERD (gastroesophageal reflux disease)   ? Hepatitis C carrier ([place])   ? HIV (human immunodeficiency virus infection) ([place]) 2015  ? HIV (human immunodeficiency virus infection) ([place])   ? Psychiatric disorder   ? reported h/o schizoaffective, bipolar, anxiety  ? Schizoaffective disorder ([place])   ? Stroke [facility])   ? ? ?Social History  ? ?Tobacco Use  ? Smoking status: Some Days  ?  Packs/day: 0.30  ?  Years: 48.00  ?  Pack years: 14.40  ?  Types: Cigarettes  ? Smokeless tobacco: Never  ?Vaping Use  ? Vaping Use: Never used  ?Substance Use Topics  ? Alcohol use: Not Currently  ? Drug use: Not Currently  ?  Types: "Crack" cocaine, IV, Marijuana  ?  Comment: former(2021);  ? ? ?Family History  ?Problem Relation Age of Onset  ? Psychiatric Illness Mother   ? Hepatitis Father   ?  Alcohol abuse Father   ? Breast cancer Paternal Aunt   ? Colon cancer Neg Hx   ? Stomach cancer Neg Hx   ? Colon polyps Neg Hx   ? Esophageal cancer Neg Hx   ? Rectal cancer Neg Hx   ? ? ?Allergies  ?Allergen Reactions  ? Haldol [Haloperidol] Other (See Comments)  ?  Makes jittery  ? ? ?Review of Systems  ?Constitutional: Negative.   ?Respiratory: Negative.    ?Cardiovascular: Negative.   ?Gastrointestinal: Negative.   ?Psychiatric/Behavioral:  Positive for depression. Negative for suicidal ideas.   ? ? ?OBJECTIVE:   ? ?Vitals:  ? 05/29/21 1113  ?BP: 123/86  ?Pulse: 78  ?Temp: 98 ?F (36.7 ?C)  ?TempSrc: Oral  ?SpO2: 96%  ?Weight: 195 lb (88.5 kg)  ?Height: '5\' 5"'$  (1.651 m)  ?   ?Body mass index is 32.45 kg/m?. ? ?Physical Exam ?Constitutional:   ?   General: She is not in acute distress. ?   Appearance: Normal appearance.  ?HENT:  ?   Head: Normocephalic and atraumatic.  ?Eyes:  ?   Extraocular Movements: Extraocular movements intact.  ?   Conjunctiva/sclera:  Conjunctivae normal.  ?Pulmonary:  ?   Effort: Pulmonary effort is normal. No respiratory distress.  ?Abdominal:  ?   General: There is no distension.  ?   Palpations: Abdomen is soft.  ?Musculoskeletal:  ?   Cervical back: Normal range of motion and neck supple.  ?Skin: ?   General: Skin is warm and dry.  ?Neurological:  ?   General: No focal deficit present.  ?   Mental Status: She is alert and oriented to person, place, and time.  ?Psychiatric:     ?   Mood and Affect: Mood normal.     ?   Behavior: Behavior normal.  ? ? ?Labs and Microbiology: ? ?  Latest Ref Rng & Units 09/22/2020  ?  5:23 AM 09/18/2020  ?  2:22 AM 09/17/2020  ? 10:54 AM  ?CMP  ?Glucose 70 - 99 mg/dL 101   95   95    ?BUN 6 - 20 mg/dL '19   9   9    '$ ?Creatinine 0.44 - 1.00 mg/dL 0.88   0.72   0.71    ?Sodium 135 - 145 mmol/L 139   137   140    ?Potassium 3.5 - 5.1 mmol/L 4.0   4.3   4.1    ?Chloride 98 - 111 mmol/L 105   107   108    ?CO2 22 - 32 mmol/L '24   20   24    '$ ?Calcium 8.9 - 10.3 mg/dL 9.5   9.3   9.4    ?Total Protein 6.5 - 8.1 g/dL 6.3   6.8     ?Total Bilirubin 0.3 - 1.2 mg/dL 0.8   1.2     ?Alkaline Phos 38 - 126 U/L 67   68     ?AST 15 - 41 U/L 15   24     ?ALT 0 - 44 U/L 14   15     ? ? ?  Latest Ref Rng & Units 09/22/2020  ?  5:23 AM 09/18/2020  ?  2:22 AM 09/17/2020  ? 10:54 AM  ?CBC  ?WBC 4.0 - 10.5 K/uL 4.2   4.6   5.9    ?Hemoglobin 12.0 - 15.0 g/dL 12.5   14.5   13.4    ?Hematocrit 36.0 - 46.0 % 37.2  45.5   40.4    ?Platelets 150 - 400 K/uL 169   170   204    ?  ? ?Lab Results  ?Component Value Date  ? HIV1RNAQUANT Not Detected 02/22/2021  ? HIV1RNAQUANT Not Detected 10/25/2020  ? HIV1RNAQUANT NOT DETECTED 07/11/2020  ? CD4TABS 749 10/25/2020  ? CD4TABS 869 07/11/2020  ? CD4TABS 733 11/30/2019  ? ? ?RPR and STI: ?Lab Results  ?Component Value Date  ? LABRPR NON-REACTIVE 07/11/2020  ? LABRPR NON REACTIVE 06/20/2020  ? LABRPR NON-REACTIVE 04/20/2019  ? ? ?STI Results GC CT  ?04/20/2019 ? 2:51 PM Negative   Negative     ? ? ?Hepatitis B: ?No results found for: HEPBSAB, HEPBSAG, HEPBCAB ?Hepatitis C: ?Lab Results  ?Component Value Date  ? JXBJYNWGNFA <15 01/07/2020  ? ?Hepatitis A: ?No results found for: HAV ?Lipids: ?Lab Results  ?Component Value Date  ? CHOL 232 (H) 09/18/2020  ? TRIG 161 (H) 09/18/2020  ? HDL 36 (L) 09/18/2020  ? CHOLHDL 6.4 09/18/2020  ? VLDL 32 09/18/2020  ? LDLCALC 164 (H) 09/18/2020  ? ? ? ?ASSESSMENT & PLAN:   ? ?HIV (human immunodeficiency virus infection) ([place]) ?Patient here today for routine follow up.  Previously in care with Dr Johnnye Sima.  Diagnosed in 2016 and previously in care in [place]. She is now established with [facility] since March 2021.  Currently on [place] which is filled through [facility].  She saw Dr Johnnye Sima last in June 2022 and Cassie in January 2023.  Her viral load in January was <20 and she has no issues with taking, tolerating, or obtaining her Biktarvy.  Will continue for now and check labs today.  Follow up in 6 months if labs look okay.  Will also arrange for follow up in our dentistry clinic.  ? ?History of hepatitis C ?She was treated with Mavyret and obtained undetectable VL in December 2021.  Her pre-treatment Fibrosis score was F3.  Will repeat Fibrosure today to reassess fibrosis following treatment and obtain viral load as this appears to have been last checked prior to 12 weeks after completion of therapy.  She will need continued liver monitoring for [facility] given her advanced Fibrosis prior to treatment so will check liver US at follow up. ? ?Vaccine counseling ?Recommended PCV 20 and she currently declines.  ? ?Right pontine cerebrovascular accident [facility]) ?She has history of right pons and right cerebellum infarct in August 2022.  She is currently on ASA and statin followed by neurology and PCP. ? ? ?Orders Placed This Encounter  ?Procedures  ? COMPLETE METABOLIC PANEL WITH GFR  ? HIV-1 RNA quant-no reflex-bld  ? T-helper cell (CD4)- ([facility] clinic only)  ? Hepatitis C RNA  quantitative  ? Liver Fibrosis, FibroTest-ActiTest  ?  ? ?Mignon Pine ?[facility] for Infectious Disease ?[facility] ?05/29/2021, 11:36 AM ? ? ? ? ? ?

## 2021-05-29 NOTE — Assessment & Plan Note (Signed)
She was treated with Mavyret and obtained undetectable VL in December 2021.  Her pre-treatment Fibrosis score was F3.  Will repeat Fibrosure today to reassess fibrosis following treatment and obtain viral load as this appears to have been last checked prior to 12 weeks after completion of therapy.  She will need continued liver monitoring for [facility] given her advanced Fibrosis prior to treatment so will check liver US at follow up. ?

## 2021-05-29 NOTE — Patient Instructions (Signed)
Thank you for coming to see me today. It was a pleasure seeing you. ? ?To Do: ?Labs today ?Continue Biktarvy ?Get set up with dentistry ? ?If you have any questions or concerns, please do not hesitate to call the office at [phone]. ? ?Take Care,  ? ?Jule Ser ? ?

## 2021-05-30 ENCOUNTER — Other Ambulatory Visit (HOSPITAL_COMMUNITY): Payer: Self-pay

## 2021-05-30 LAB — T-HELPER CELL (CD4) - (RCID CLINIC ONLY)
CD4 % Helper T Cell: 44 % (ref 33–65)
CD4 T Cell Abs: 706 /uL (ref 400–1790)

## 2021-05-31 ENCOUNTER — Encounter (HOSPITAL_COMMUNITY): Payer: Self-pay

## 2021-05-31 ENCOUNTER — Telehealth (HOSPITAL_COMMUNITY): Payer: No Typology Code available for payment source | Admitting: Psychiatry

## 2021-06-01 ENCOUNTER — Telehealth: Payer: Self-pay

## 2021-06-01 NOTE — Telephone Encounter (Signed)
Called pt reference next PREP class starting in [place] 5/16.  ?Sts she was switched to [place] location. Message sent to coach to confirm.  ? ?

## 2021-06-01 NOTE — Telephone Encounter (Signed)
She now prefers coming to [employer] for [employer], would like to attend the next class starting May 22 12-1:15; will contact her mid-May to set up assessment visit.  ?

## 2021-06-03 LAB — COMPLETE METABOLIC PANEL WITH GFR
AG Ratio: 1.6 (calc) (ref 1.0–2.5)
ALT: 21 U/L (ref 6–29)
AST: 21 U/L (ref 10–35)
Albumin: 4.4 g/dL (ref 3.6–5.1)
Alkaline phosphatase (APISO): 107 U/L (ref 37–153)
BUN: 12 mg/dL (ref 7–25)
CO2: 28 mmol/L (ref 20–32)
Calcium: 9.7 mg/dL (ref 8.6–10.4)
Chloride: 104 mmol/L (ref 98–110)
Creat: 0.86 mg/dL (ref 0.50–1.03)
Globulin: 2.8 g/dL (calc) (ref 1.9–3.7)
Glucose, Bld: 109 mg/dL — ABNORMAL HIGH (ref 65–99)
Potassium: 4.4 mmol/L (ref 3.5–5.3)
Sodium: 140 mmol/L (ref 135–146)
Total Bilirubin: 0.5 mg/dL (ref 0.2–1.2)
Total Protein: 7.2 g/dL (ref 6.1–8.1)
eGFR: 78 mL/min/{1.73_m2} (ref >=60)

## 2021-06-03 LAB — LIVER FIBROSIS, FIBROTEST-ACTITEST
ALT: 21 U/L (ref 6–29)
Alpha-2-Macroglobulin: 424 mg/dL — ABNORMAL HIGH (ref 106–279)
Apolipoprotein A1: 150 mg/dL (ref 101–198)
Bilirubin: 0.6 mg/dL (ref 0.2–1.2)
Fibrosis Score: 0.5
GGT: 17 U/L (ref 3–70)
Haptoglobin: 134 mg/dL (ref 43–212)
Necroinflammat ACT Score: 0.11
Reference ID: 4352042

## 2021-06-03 LAB — HEPATITIS C RNA QUANTITATIVE
HCV Quantitative Log: <1.18 log IU/mL
HCV RNA, PCR, QN: <15 IU/mL

## 2021-06-03 LAB — HIV-1 RNA QUANT-NO REFLEX-BLD
HIV 1 RNA Quant: <20 copies/mL — AB
HIV-1 RNA Quant, Log: <1.3 Log copies/mL — AB

## 2021-06-04 ENCOUNTER — Telehealth: Payer: Self-pay

## 2021-06-04 NOTE — Telephone Encounter (Signed)
Patient aware of results and voiced her understanding.   Buren Havey P Georgie Eduardo, CMA  

## 2021-06-04 NOTE — Telephone Encounter (Signed)
For PREP she will need to to go [place] for transportation purposes; Will have [facility] contact her about next classes starting there ?

## 2021-06-04 NOTE — Telephone Encounter (Signed)
Call to pt reference next PREP class at [facility]. [place] from [place] coach pt needs to use transport in [place]. Confirmed she can do 5/16 class T/Th 2p-315pm ?Needs to call to confirm transport ?Intake scheduled for 5/11 at [place] for 230p-she'll let me know if transport is a problem. ?

## 2021-06-04 NOTE — Telephone Encounter (Signed)
-----   Message from Mignon Pine, DO sent at 06/04/2021  4:08 PM EDT ----- ?Please let patient know labs look good. ? ?HIV viral load is undetectable and CD4 count 706. ?Her Hep C viral load is still undetectable indicating she has been cured. Her liver fibrosis score has shown improvement as well since treating her hep C. ? ?Follow up in November as scheduled and continue with Biktarvy.  ? ?Thanks ?

## 2021-06-12 ENCOUNTER — Other Ambulatory Visit (HOSPITAL_COMMUNITY): Payer: Self-pay

## 2021-06-12 ENCOUNTER — Telehealth (INDEPENDENT_AMBULATORY_CARE_PROVIDER_SITE_OTHER): Payer: Medicaid Other | Admitting: Psychiatry

## 2021-06-12 DIAGNOSIS — F411 Generalized anxiety disorder: Secondary | ICD-10-CM | POA: Diagnosis not present

## 2021-06-12 MED ORDER — ALPRAZOLAM 0.5 MG PO TABS
0.2500 mg | ORAL_TABLET | Freq: Every day | ORAL | 0 refills | Status: DC
  Filled 2021-06-12 – 2021-06-16 (×3): qty 30, 30d supply, fill #0

## 2021-06-12 NOTE — Progress Notes (Signed)
BH MD/PA/NP OP Progress Note ? ?06/12/2021 8:05 AM ?Mackenzie Gonzalez  ?MRN:  [id number] ? ? ?Virtual Visit via Video Note ? ?I connected with Mackenzie Gonzalez on 06/12/21 at  8:00 AM EDT by a video enabled telemedicine application and verified that I am speaking with the correct person using two identifiers. ? ?Location: ?Patient: home ?Provider: offsite ?  ?I discussed the limitations of evaluation and management by telemedicine and the availability of in person appointments. The patient expressed understanding and agreed to proceed. ? ?  ?I discussed the assessment and treatment plan with the patient. The patient was provided an opportunity to ask questions and all were answered. The patient agreed with the plan and demonstrated an understanding of the instructions. ?  ?The patient was advised to call back or seek an in-person evaluation if the symptoms worsen or if the condition fails to improve as anticipated. ? ?I provided 5 minutes of non-face-to-face time during this encounter. ? ? ?Franne Grip, NP  ? ?Chief Complaint: Medication management ? ?HPI: Mackenzie Gonzalez is a 61 year old female presenting to [facility] behavioral health outpatient for follow-up psychiatric evaluation.  Patient is being seen by this provider as her attending psychiatric provider is unavailable to complete today's appointment.  She has a psychiatric history of bipolar disorder, anxiety, schizoaffective disorder bipolar type.  Her symptoms are managed with Xanax 0.25 to 0.5 mg daily, Seroquel 400 mg daily at bedtime.  Patient reports that her medications are effective with managing her symptoms and that she is medication compliant.  Patient denies adverse medication reactions or the need for dosage adjustment today.  No medication changes today. ? ? ?Visit Diagnosis: No diagnosis found. ? ?Past Psychiatric History:  ? ? ?Past Medical History:  ?Past Medical History:  ?Diagnosis Date  ? Anxiety   ? Bipolar 1 disorder ([place])   ? Cancer [facility])    ? Chronic bronchitis ([place])   ? GERD (gastroesophageal reflux disease)   ? Hepatitis C carrier ([place])   ? HIV (human immunodeficiency virus infection) ([place]) 2015  ? HIV (human immunodeficiency virus infection) ([place])   ? Psychiatric disorder   ? reported h/o schizoaffective, bipolar, anxiety  ? Schizoaffective disorder ([place])   ? Stroke [facility])   ?  ?Past Surgical History:  ?Procedure Laterality Date  ? BIOPSY  06/18/2020  ? Procedure: BIOPSY;  Surgeon: [name], MD;  Location: [place];  Service: Gastroenterology;;  ? BREAST EXCISIONAL BIOPSY Left 1987  ? benign  ? ESOPHAGOGASTRODUODENOSCOPY (EGD) WITH PROPOFOL N/A 06/18/2020  ? Procedure: ESOPHAGOGASTRODUODENOSCOPY (EGD) WITH PROPOFOL;  Surgeon: [name], MD;  Location: [place];  Service: Gastroenterology;  Laterality: N/A;  ? HARDWARE REMOVAL Right 02/12/2021  ? Procedure: Right hand metacarpal deep implant removal;  Surgeon: [name], MD;  Location: [place];  Service: Orthopedics;  Laterality: Right;  with IV sedation  ? I & D EXTREMITY Right 06/16/2020  ? Procedure: Open debridement of skin subcutaneous tissue and bone associated with open grade 2 fracture right hand;Radiographs 3 views right hand Complex wound closure degloving injury dorsal aspect of the hand greater than 10 cm. Open reduction and internal fixation right small finger metacarpal shaft ;  Surgeon: [name], MD;  Location: [place];  Service: Orthopedics;  Laterality: Right;  ? ORIF WRIST FRACTURE Left 06/16/2020  ? Procedure: Open reduction internal fixation displaced intra-articular distal radius fracture left wrist 3 more fragments; Radiographs 3 views left wrist Left wrist brachioadialis tendon release, tendon tenotomy;  Surgeon: [name], MD;  Location: [place];  Service: Orthopedics;  Laterality: Left;  ? SP ARTHRO WRIST*R*    ? WRIST SURGERY    ? ? ?Family Psychiatric History:  ? ?Family History:  ?Family History  ?Problem Relation Age of Onset   ? Psychiatric Illness Mother   ? Hepatitis Father   ? Alcohol abuse Father   ? Breast cancer Paternal Aunt   ? Colon cancer Neg Hx   ? Stomach cancer Neg Hx   ? Colon polyps Neg Hx   ? Esophageal cancer Neg Hx   ? Rectal cancer Neg Hx   ? ? ?Social History:  ?Social History  ? ?Socioeconomic History  ? Marital status: Single  ?  Spouse name: Not on file  ? Number of children: Not on file  ? Years of education: Not on file  ? Highest education level: Not on file  ?Occupational History  ? Not on file  ?Tobacco Use  ? Smoking status: Some Days  ?  Packs/day: 0.30  ?  Years: 48.00  ?  Pack years: 14.40  ?  Types: Cigarettes  ? Smokeless tobacco: Never  ?Vaping Use  ? Vaping Use: Never used  ?Substance and Sexual Activity  ? Alcohol use: Not Currently  ? Drug use: Not Currently  ?  Types: "Crack" cocaine, IV, Marijuana  ?  Comment: former(2021);  ? Sexual activity: Not Currently  ?  Partners: Female, Female  ?Other Topics Concern  ? Not on file  ?Social History Narrative  ? ** Merged History Encounter **  ?    ? ?Social Determinants of Health  ? ?Financial Resource Strain: Not on file  ?Food Insecurity: Not on file  ?Transportation Needs: Not on file  ?Physical Activity: Not on file  ?Stress: Not on file  ?Social Connections: Not on file  ? ? ?Allergies:  ?Allergies  ?Allergen Reactions  ? Haldol [Haloperidol] Other (See Comments)  ?  Makes jittery  ? ? ?Metabolic Disorder Labs: ?Lab Results  ?Component Value Date  ? HGBA1C 5.5 09/18/2020  ? MPG 111.15 09/18/2020  ? MPG 105.41 07/26/2019  ? ?No results found for: PROLACTIN ?Lab Results  ?Component Value Date  ? CHOL 232 (H) 09/18/2020  ? TRIG 161 (H) 09/18/2020  ? HDL 36 (L) 09/18/2020  ? CHOLHDL 6.4 09/18/2020  ? VLDL 32 09/18/2020  ? LDLCALC 164 (H) 09/18/2020  ? LDLCALC 121 (H) 07/26/2019  ? ?Lab Results  ?Component Value Date  ? TSH 1.570 03/23/2021  ? TSH 1.726 07/26/2019  ? ? ?Therapeutic Level Labs: ?No results found for: LITHIUM ?No results found for:  VALPROATE ?No components found for:  CBMZ ? ?Current Medications: ?Current Outpatient Medications  ?Medication Sig Dispense Refill  ? albuterol (VENTOLIN HFA) 108 (90 Base) MCG/ACT inhaler Inhale 2 puffs into the lungs every 6 (six) hours as needed for wheezing or shortness of breath. 8.5 g 6  ? ALPRAZolam (XANAX) 0.5 MG tablet Take 1/2 to 1 tablet by mouth daily. 30 tablet 2  ? aspirin 81 MG chewable tablet Chew 1 tablet (81 mg total) by mouth daily.    ? atorvastatin (LIPITOR) 80 MG tablet Take 1 tablet (80 mg total) by mouth daily. 90 tablet 1  ? bictegravir-emtricitabine-tenofovir AF (BIKTARVY) 50-200-25 MG TABS tablet Take 1 tablet by mouth daily. 90 tablet 0  ? cyanocobalamin 1000 MCG tablet Take 1 tablet (1,000 mcg total) by mouth daily. 30 tablet 0  ? DULoxetine (CYMBALTA) 30 MG capsule Take 1  capsule (30 mg total) by mouth daily. 90 capsule 0  ? linaclotide (LINZESS) 145 MCG CAPS capsule Take 1 capsule (145 mcg total) by mouth daily before breakfast. (Patient not taking: Reported on 05/29/2021) 30 capsule 0  ? pantoprazole (PROTONIX) 40 MG tablet Take 1 tablet by mouth twice a day. 60 tablet 3  ? polyethylene glycol (MIRALAX / GLYCOLAX) 17 g packet Take 17 g by mouth 2 (two) times daily as needed for mild constipation. 14 each 0  ? QUEtiapine (SEROQUEL) 400 MG tablet Take 1 tablet (400 mg total) by mouth at bedtime. 30 tablet 3  ? senna-docusate (SENOKOT-S) 8.6-50 MG tablet Take 1 tablet by mouth 2 (two) times daily.    ? sucralfate (CARAFATE) 1 g tablet Take 1 tablet (1 g total) by mouth 4 (four) times daily -  with meals and at bedtime. (Patient not taking: Reported on 05/29/2021) 90 tablet 0  ? valACYclovir (VALTREX) 1000 MG tablet Take 1,000 mg by mouth See admin instructions. Take 1 tablet by mouth three times daily ONLY when having an active break out (Patient not taking: Reported on 05/29/2021)    ? ?No current facility-administered medications for this visit.  ? ? ? ?Musculoskeletal: ?Strength & Muscle  Tone: N/A virtual visit ?Gait & Station: N/A ?Patient leans: N/A ? ?Psychiatric Specialty Exam: ?Review of Systems  ?Psychiatric/Behavioral:  Negative for hallucinations, self-injury and suicidal ideas.   ?All ot

## 2021-06-13 ENCOUNTER — Other Ambulatory Visit (HOSPITAL_COMMUNITY): Payer: Self-pay

## 2021-06-15 ENCOUNTER — Other Ambulatory Visit (HOSPITAL_COMMUNITY): Payer: Self-pay

## 2021-06-16 ENCOUNTER — Other Ambulatory Visit (HOSPITAL_COMMUNITY): Payer: Self-pay

## 2021-06-18 ENCOUNTER — Other Ambulatory Visit (HOSPITAL_COMMUNITY): Payer: Self-pay

## 2021-06-19 ENCOUNTER — Ambulatory Visit (INDEPENDENT_AMBULATORY_CARE_PROVIDER_SITE_OTHER): Payer: Medicaid Other | Admitting: Internal Medicine

## 2021-06-19 ENCOUNTER — Other Ambulatory Visit (HOSPITAL_COMMUNITY): Payer: Self-pay

## 2021-06-19 DIAGNOSIS — R32 Unspecified urinary incontinence: Secondary | ICD-10-CM

## 2021-06-19 LAB — POCT URINALYSIS DIPSTICK
Bilirubin, UA: NEGATIVE
Blood, UA: NEGATIVE
Glucose, UA: NEGATIVE
Ketones, UA: NEGATIVE
Leukocytes, UA: NEGATIVE
Nitrite, UA: NEGATIVE
Protein, UA: NEGATIVE
Spec Grav, UA: 1.01 (ref 1.010–1.025)
Urobilinogen, UA: 0.2 E.U./dL
pH, UA: 6 (ref 5.0–8.0)

## 2021-06-19 MED ORDER — ATORVASTATIN CALCIUM 80 MG PO TABS
80.0000 mg | ORAL_TABLET | Freq: Every day | ORAL | 0 refills | Status: DC
  Filled 2021-06-19 – 2021-10-08 (×3): qty 90, 90d supply, fill #0

## 2021-06-19 MED ORDER — MIRABEGRON ER 25 MG PO TB24
25.0000 mg | ORAL_TABLET | Freq: Every day | ORAL | 0 refills | Status: DC
Start: 2021-06-19 — End: 2021-07-26
  Filled 2021-06-19: qty 90, 90d supply, fill #0

## 2021-06-19 NOTE — Patient Instructions (Signed)
Thank you, Ms.Margerie VERDELLE VALTIERRA for allowing [place] to provide your care today.   I have ordered the following labs for you:  Lab Orders         Urinalysis, dipstick only         Urinalysis, Reflex Microscopic      Referrals ordered today:   Referral Orders         Ambulatory referral to Urology      I have ordered the following medication/changed the following medications:   Start the following medications: Meds ordered this encounter  Medications   atorvastatin (LIPITOR) 80 MG tablet    Sig: Take 1 tablet (80 mg total) by mouth daily.    Dispense:  90 tablet    Refill:  0   mirabegron ER (MYRBETRIQ) 25 MG TB24 tablet    Sig: Take 1 tablet (25 mg total) by mouth daily.    Dispense:  90 tablet    Refill:  0     Should you have any questions or concerns please call the internal medicine clinic at [phone].    Timothy Lasso, MD [place]

## 2021-06-19 NOTE — Progress Notes (Signed)
CC: Urinary incontinence  HPI:  [name] is a 61 y.o. female with a past medical history stated below and presents today for CC listed above. Please see problem based assessment and plan for additional details.  Past Medical History:  Diagnosis Date   Anxiety    Bipolar 1 disorder ([place])    Cancer ([place])    Chronic bronchitis ([place])    GERD (gastroesophageal reflux disease)    Hepatitis C carrier ([facility])    HIV (human immunodeficiency virus infection) ([place]) 2015   HIV (human immunodeficiency virus infection) ([place])    Psychiatric disorder    reported h/o schizoaffective, bipolar, anxiety   Schizoaffective disorder ([place])    Stroke ([place])     Current Outpatient Medications on File Prior to Visit  Medication Sig Dispense Refill   albuterol (VENTOLIN HFA) 108 (90 Base) MCG/ACT inhaler Inhale 2 puffs into the lungs every 6 (six) hours as needed for wheezing or shortness of breath. 8.5 g 6   ALPRAZolam (XANAX) 0.5 MG tablet Take 1/2 to 1 tablet by mouth daily. 30 tablet 0   aspirin 81 MG chewable tablet Chew 1 tablet (81 mg total) by mouth daily.     bictegravir-emtricitabine-tenofovir AF (BIKTARVY) 50-200-25 MG TABS tablet Take 1 tablet by mouth daily. 90 tablet 0   cyanocobalamin 1000 MCG tablet Take 1 tablet (1,000 mcg total) by mouth daily. 30 tablet 0   DULoxetine (CYMBALTA) 30 MG capsule Take 1 capsule (30 mg total) by mouth daily. 90 capsule 0   linaclotide (LINZESS) 145 MCG CAPS capsule Take 1 capsule (145 mcg total) by mouth daily before breakfast. (Patient not taking: Reported on 05/29/2021) 30 capsule 0   pantoprazole (PROTONIX) 40 MG tablet Take 1 tablet by mouth twice a day. 60 tablet 3   polyethylene glycol (MIRALAX / GLYCOLAX) 17 g packet Take 17 g by mouth 2 (two) times daily as needed for mild constipation. 14 each 0   QUEtiapine (SEROQUEL) 400 MG tablet Take 1 tablet (400 mg total) by mouth at bedtime. 30 tablet 3   senna-docusate (SENOKOT-S) 8.6-50 MG tablet Take 1  tablet by mouth 2 (two) times daily.     sucralfate (CARAFATE) 1 g tablet Take 1 tablet (1 g total) by mouth 4 (four) times daily -  with meals and at bedtime. (Patient not taking: Reported on 05/29/2021) 90 tablet 0   valACYclovir (VALTREX) 1000 MG tablet Take 1,000 mg by mouth See admin instructions. Take 1 tablet by mouth three times daily ONLY when having an active break out (Patient not taking: Reported on 05/29/2021)     No current facility-administered medications on file prior to visit.    Family History  Problem Relation Age of Onset   Psychiatric Illness Mother    Hepatitis Father    Alcohol abuse Father    Breast cancer Paternal Aunt    Colon cancer Neg Hx    Stomach cancer Neg Hx    Colon polyps Neg Hx    Esophageal cancer Neg Hx    Rectal cancer Neg Hx     Social History   Socioeconomic History   Marital status: Single    Spouse name: Not on file   Number of children: Not on file   Years of education: Not on file   Highest education level: Not on file  Occupational History   Not on file  Tobacco Use   Smoking status: Some Days    Packs/day: 0.30    Years: 48.00  Pack years: 14.40    Types: Cigarettes   Smokeless tobacco: Never  Vaping Use   Vaping Use: Never used  Substance and Sexual Activity   Alcohol use: Not Currently   Drug use: Not Currently    Types: "Crack" cocaine, IV, Marijuana    Comment: former(2021);   Sexual activity: Not Currently    Partners: Female, Female  Other Topics Concern   Not on file  Social History Narrative   ** Merged History Encounter **       Social Determinants of Health   Financial Resource Strain: Not on file  Food Insecurity: Not on file  Transportation Needs: Not on file  Physical Activity: Not on file  Stress: Not on file  Social Connections: Not on file  Intimate Partner Violence: Not on file    Review of Systems: ROS negative except for what is noted on the assessment and plan.  Vitals:   06/19/21 0828  06/19/21 0852  BP: (!) 156/87 (!) 155/90  Pulse: 66 65  Temp: 98.4 F (36.9 C)   TempSrc: Oral   SpO2: 100%   Weight: 197 lb (89.4 kg)      Physical Exam: Constitutional: Normal-appearing woman sitting in the chair, in no acute distress HENT: normocephalic atraumatic, mucous membranes moist Eyes: conjunctiva non-erythematous Neck: supple Cardiovascular: regular rate and rhythm, no m/r/g Pulmonary/Chest: normal work of breathing on room air, lungs clear to auscultation bilaterally Abdominal: soft, non-tender, non-distended MSK: normal bulk and tone Neurological: alert & oriented x 3, 5/5 strength in bilateral upper and lower extremities, abnormal gait with cane Skin: warm and dry Psych: Normal mood   Assessment & Plan:   See Encounters Tab for problem based charting.  Patient discussed with Dr. Pecolia Ades, M.D. [facility], PGY-1 Pager: [phone], Phone: [phone] Date 06/19/2021 Time 11:15 AM

## 2021-06-19 NOTE — Assessment & Plan Note (Addendum)
For the past several years, intermittent urinary incontinence. However, the past year it has gotten worse, unable to keep dry clothes. Due to ambulating with cane unable to get to the bathroom quick enough. Cough,sneeze and laughter cause incontinence. When she has to go, feels a sudden urge. No burning or discomfort. No signs of infection. No fever or chills.  She does however endorse urgency.  Attempted toilet training-tries go to the bathroom frequently to avoid incontinence.  GU history of 1 cesarean section; no vaginal delivery.  Post void residual scan obtained in office showed 12 mL. 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  She would likely benefit from pharmacologic treatment and a urology referral.  Urine dipstick in office negative for leukocytes esterase or nitrates.  P: -Myrbetriq 25 mg daily -Referral to urology -DME order for adult diapers -Follow-up UA

## 2021-06-20 LAB — URINALYSIS, ROUTINE W REFLEX MICROSCOPIC
Bilirubin, UA: NEGATIVE
Glucose, UA: NEGATIVE
Ketones, UA: NEGATIVE
Leukocytes,UA: NEGATIVE
Nitrite, UA: NEGATIVE
Protein,UA: NEGATIVE
RBC, UA: NEGATIVE
Specific Gravity, UA: 1.01 (ref 1.005–1.030)
Urobilinogen, Ur: 0.2 mg/dL (ref 0.2–1.0)
pH, UA: 6 (ref 5.0–7.5)

## 2021-06-21 ENCOUNTER — Telehealth: Payer: Self-pay

## 2021-06-21 NOTE — Progress Notes (Signed)
Internal Medicine Clinic Attending ? ?Case discussed with Dr. Ariwodo  At the time of the visit.  We reviewed the resident?s history and exam and pertinent patient test results.  I agree with the assessment, diagnosis, and plan of care documented in the resident?s note.  ?

## 2021-06-21 NOTE — Telephone Encounter (Signed)
Pa for pt ( MYRBETRIQ 25 MG TAB )  came through via fax from pharmacy was submitted with office notes  to [place] [facility]....       [place] [employer] will send the approval or denial to the patients  home address with in a few days to a week  and notify the Pharmacy also

## 2021-06-22 ENCOUNTER — Other Ambulatory Visit (HOSPITAL_COMMUNITY): Payer: Self-pay

## 2021-06-26 ENCOUNTER — Other Ambulatory Visit (HOSPITAL_COMMUNITY): Payer: Self-pay

## 2021-07-02 ENCOUNTER — Other Ambulatory Visit (HOSPITAL_COMMUNITY): Payer: Self-pay

## 2021-07-02 IMAGING — MG DIGITAL SCREENING BILAT W/ TOMO W/ CAD
8 series · 8 of 24 positions shown · non-contrast
Comparison: None.

CLINICAL DATA: Screening.

EXAM:
DIGITAL SCREENING BILATERAL MAMMOGRAM WITH TOMO AND CAD

[L CC synth-2D]
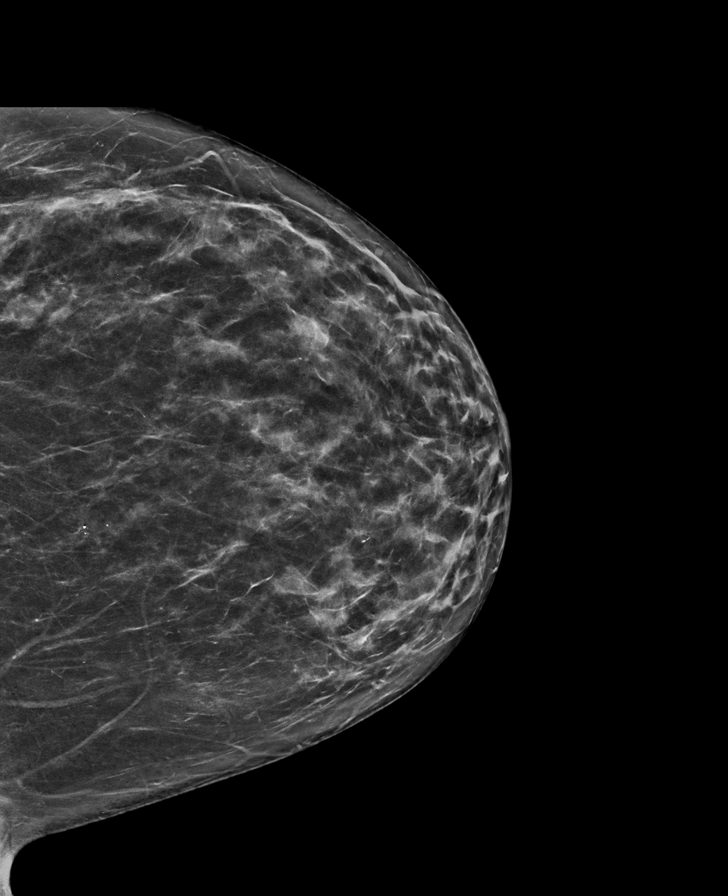

[L MLO synth-2D]
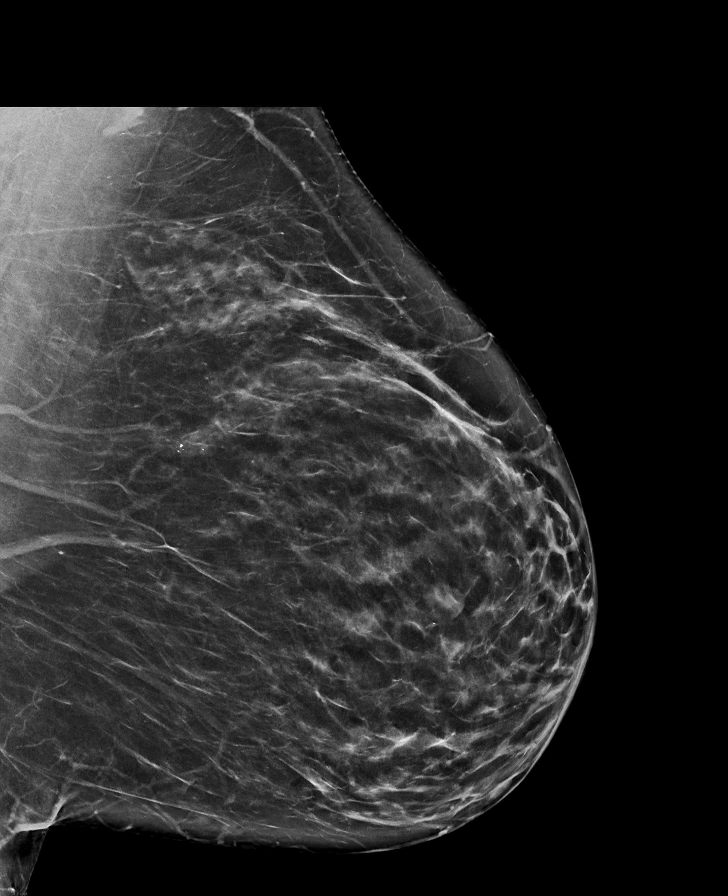

[R MLO synth-2D]
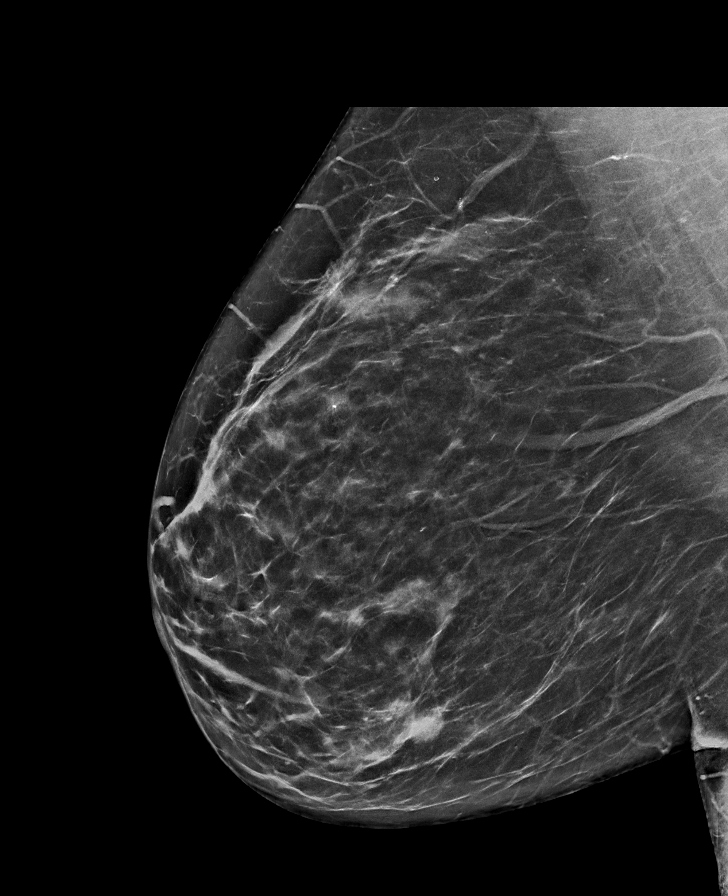

[R CC synth-2D]
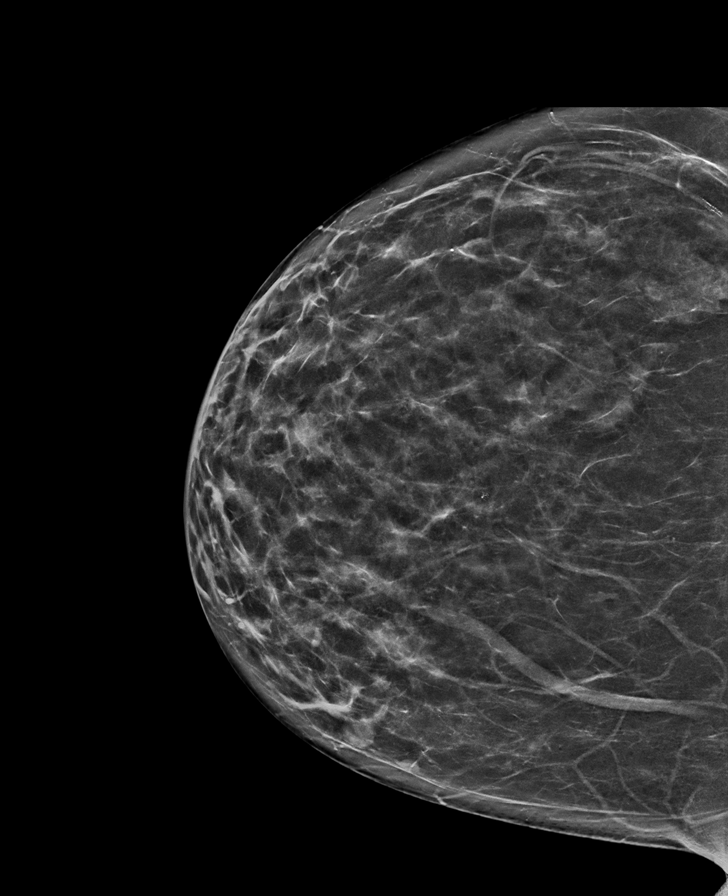

[R MLO tomo · tomo slice 41/82.0]
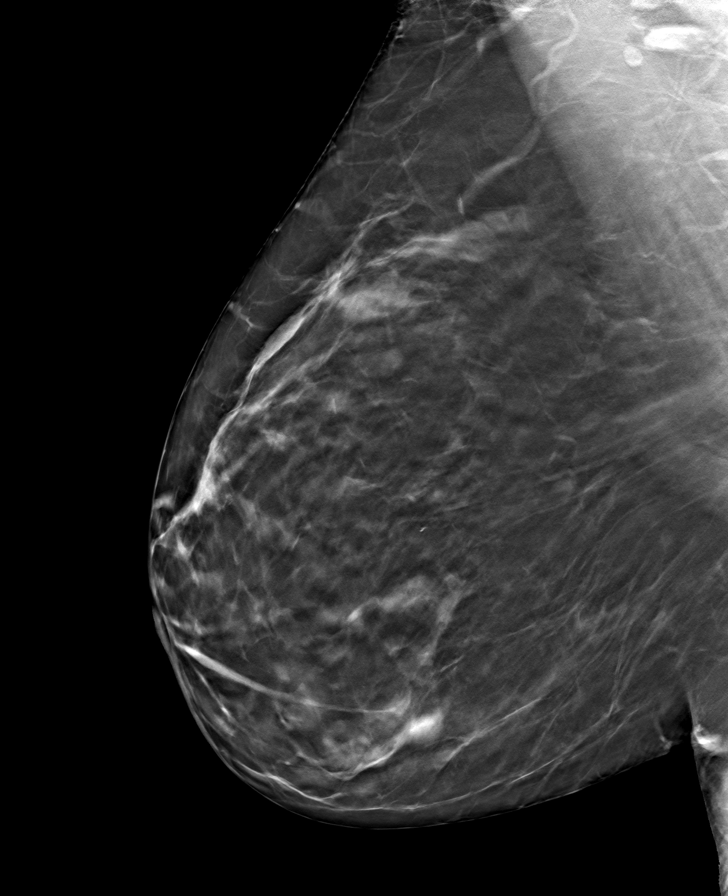

[R CC tomo · tomo slice 37/72.0]
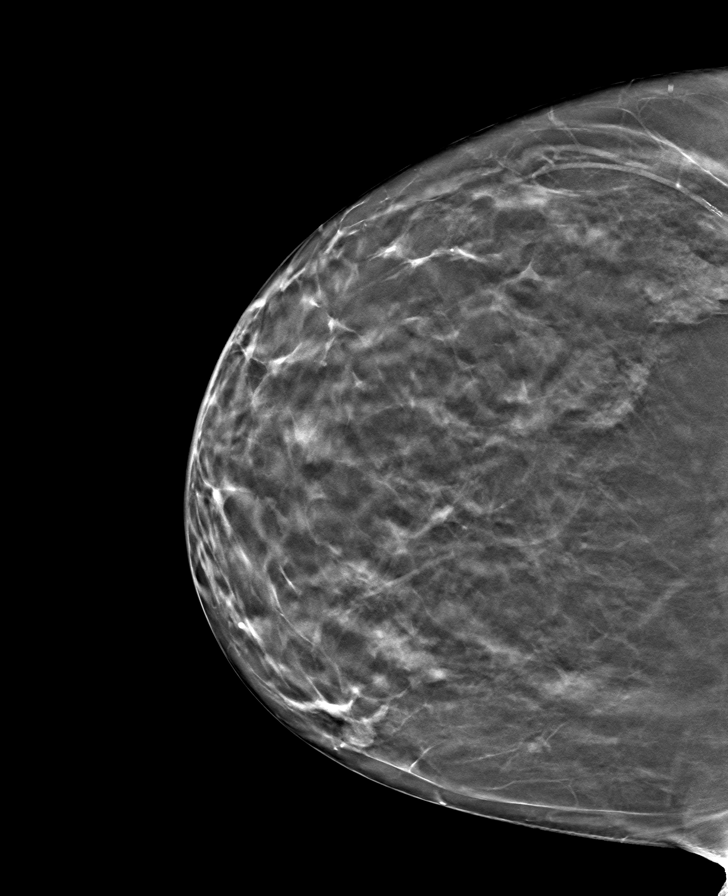

[L CC tomo · tomo slice 36/71.0]
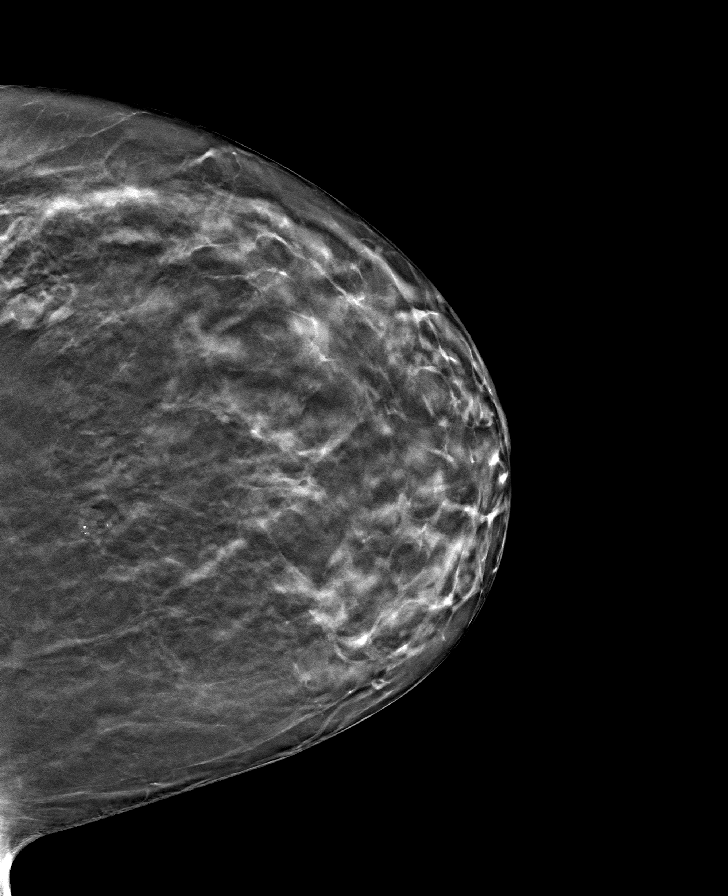

[L MLO tomo · tomo slice 41/81.0]
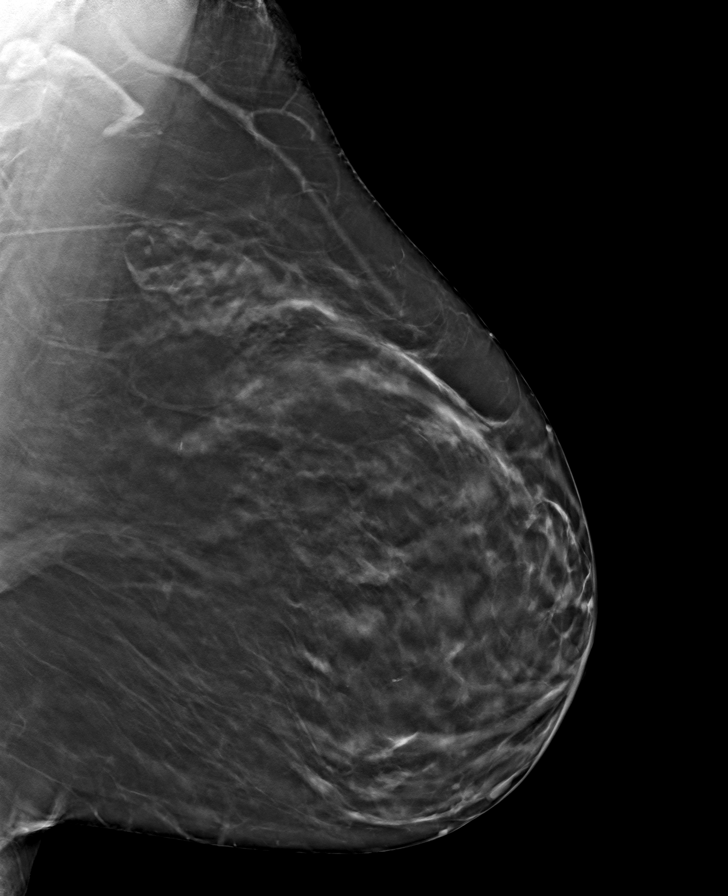

[8 of 24 positions shown; findings below may reference images not displayed]

ACR Breast Density Category b: There are scattered areas of
fibroglandular density.
FINDINGS: In the right breast 3 focal asymmetries requires further evaluation.

In the left breast a focal asymmetry requires further evaluation.

Images were processed with CAD.
IMPRESSION: Further evaluation is suggested for possible focal asymmetries in
the right breast.

Further evaluation is suggested for possible focal asymmetry in the
left breast.

RECOMMENDATION:
Diagnostic mammogram and possibly ultrasound of both breasts.
(Code:TB-[id number])

The patient will be contacted regarding the findings, and additional
imaging will be scheduled.

BI-RADS CATEGORY  0: Incomplete. Need additional imaging evaluation
and/or prior mammograms for comparison.

## 2021-07-05 ENCOUNTER — Ambulatory Visit: Payer: Medicaid Other | Admitting: Urology

## 2021-07-06 ENCOUNTER — Other Ambulatory Visit (HOSPITAL_COMMUNITY): Payer: Self-pay

## 2021-07-09 ENCOUNTER — Other Ambulatory Visit: Payer: Self-pay | Admitting: Pharmacist

## 2021-07-09 ENCOUNTER — Other Ambulatory Visit (HOSPITAL_COMMUNITY): Payer: Self-pay

## 2021-07-09 ENCOUNTER — Encounter: Payer: Self-pay | Admitting: Infectious Diseases

## 2021-07-09 DIAGNOSIS — B20 Human immunodeficiency virus [HIV] disease: Secondary | ICD-10-CM

## 2021-07-09 MED ORDER — BIKTARVY 50-200-25 MG PO TABS
1.0000 | ORAL_TABLET | Freq: Every day | ORAL | 3 refills | Status: DC
  Filled 2021-07-09 – 2021-08-16 (×3): qty 90, 90d supply, fill #0
  Filled 2021-10-08 – 2021-10-19 (×4): qty 90, 90d supply, fill #1

## 2021-07-16 ENCOUNTER — Other Ambulatory Visit (HOSPITAL_COMMUNITY): Payer: Self-pay

## 2021-07-25 ENCOUNTER — Other Ambulatory Visit (HOSPITAL_COMMUNITY): Payer: Self-pay

## 2021-07-26 ENCOUNTER — Ambulatory Visit (INDEPENDENT_AMBULATORY_CARE_PROVIDER_SITE_OTHER): Payer: Medicaid Other | Admitting: Urology

## 2021-07-26 ENCOUNTER — Other Ambulatory Visit (HOSPITAL_COMMUNITY): Payer: Self-pay

## 2021-07-26 ENCOUNTER — Encounter: Payer: Self-pay | Admitting: Urology

## 2021-07-26 VITALS — BP 136/94 | HR 83

## 2021-07-26 DIAGNOSIS — N952 Postmenopausal atrophic vaginitis: Secondary | ICD-10-CM

## 2021-07-26 DIAGNOSIS — N3946 Mixed incontinence: Secondary | ICD-10-CM | POA: Diagnosis not present

## 2021-07-26 LAB — BLADDER SCAN AMB NON-IMAGING: Scan Result: 5

## 2021-07-26 LAB — URINALYSIS, ROUTINE W REFLEX MICROSCOPIC
Bilirubin, UA: NEGATIVE
Glucose, UA: NEGATIVE
Ketones, UA: NEGATIVE
Leukocytes,UA: NEGATIVE
Nitrite, UA: NEGATIVE
Protein,UA: NEGATIVE
RBC, UA: NEGATIVE
Specific Gravity, UA: 1.025 (ref 1.005–1.030)
Urobilinogen, Ur: 0.2 mg/dL (ref 0.2–1.0)
pH, UA: 5.5 (ref 5.0–7.5)

## 2021-07-26 MED ORDER — GEMTESA 75 MG PO TABS: 75.0000 mg | ORAL_TABLET | Freq: Every day | ORAL | 0 refills | Status: DC

## 2021-07-26 NOTE — Progress Notes (Signed)
Subjective: 1. Mixed stress and urge urinary incontinence   2. Vaginal atrophy      Consult requested by Timothy Lasso MD  Mackenzie Gonzalez is a 61 yo female who sent for a several year history of progressive incontinence.  She leaks with coughing, sneezing and laughing.  She also has urgency with UUI.  She has not had any treatment.  She is wearing up to  7 heavy pads daily.  She has some enuresis.  She is emptying well with a PVR of 71m.   She has had rare UTI's remotely.  She has had a C-section but has an intact uterus.  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  She has Myrbetriq listed on her med list but she didn't get.   ROS:  Review of Systems  Constitutional:  Positive for malaise/fatigue.  Eyes:  Positive for blurred vision.  Respiratory:  Positive for shortness of breath.   Gastrointestinal:  Positive for constipation.  Musculoskeletal:  Positive for back pain and joint pain.  Skin:  Positive for itching.  Neurological:  Positive for focal weakness.  Endo/Heme/Allergies:  Positive for polydipsia.  Psychiatric/Behavioral:  Positive for depression and memory loss. The patient is nervous/anxious.   All other systems reviewed and are negative.   Allergies  Allergen Reactions   Haldol [Haloperidol] Other (See Comments)    Makes jittery    Past Medical History:  Diagnosis Date   Anxiety    Bipolar 1 disorder ([facility]    [place]cer ([facility]    Chronic bronchitis (H[facility])    GERD (gastroesophageal reflux disease)    Hepatitis C carrier ([facility]    HIV (human immunodeficiency virus infection) ([facility] 2015   HIV (human immunodeficiency virus infection) ([facility]    Psychiatric disorder    reported h/o schizoaffective, bipolar, anxiety   Sch[facility]ive disorder ([facility]    [facility]     Past Surgical History:  Procedure Laterality Date   BIOPSY  06/18/2020   Procedure: BIOPSY;  Surgeon: BThornton Park MD;  [facility]  Service: Gastroenterology;;    BREAST EXCISIONAL BIOPSY Left 1987   benign   ESOPHAGOGASTRODUODENOSCOPY (EGD) WITH PROPOFOL N/A 06/18/2020   Procedure: ESOPHAGOGASTRODUODENOSCOPY (EGD) WITH PROPOFOL;  Surgeon: BThornton Park MD;  [facility]  Service: Gastroenterology;  Laterality: N/A;   HARDWARE REMOVAL Right 02/12/2021   Procedure: Right hand metacarpal deep implant removal;  Surgeon: OIran Planas MD;  [place] [facility]  Service: Orthopedics;  Laterality: Right;  with IV sedation   I & D EXTREMITY Right 06/16/2020   Procedure: Open debridement of skin subcutaneous tissue and bone associated with open grade 2 fracture right hand;Radiographs 3 views right hand Complex wound closure degloving injury dorsal aspect of the hand greater than 10 cm. Open reduction and internal fixation right small finger metacarpal shaft ;  Surgeon: OIran Planas MD;  [place] [facility]  Service: Orthopedics;  Laterality: Right;   ORIF WRIST FRACTURE Left 06/16/2020   Procedure: Open reduction internal fixation displaced intra-articular distal radius fracture left wrist 3 more fragments; Radiographs 3 views left wrist Left wrist brachioadialis tendon release, tendon tenotomy;  Surgeon: OIran Planas MD;  [place]  Service: Orthopedics;  Laterality: Left;   SP ARTHRO WRIST*R*     WRIST SURGERY      Social History   Socioeconomic History   Marital status: Single    Spouse name: Not on file   Number of children: Not on file  Years of education: Not on file   Highest education level: Not on file  Occupational History   Not on file  Tobacco Use   Smoking status: Some Days    Packs/day: 0.30    Years: 48.00    Total pack years: 14.40    Types: Cigarettes   Smokeless tobacco: Never  Vaping Use   Vaping Use: Never used  Substance and Sexual Activity   Alcohol use: Not Currently   Drug use: Not Currently    Types: "Crack" cocaine, IV, Marijuana    Comment: former(2021);   Sexual activity: Not  Currently    Partners: Female, Female  Other Topics Concern   Not on file  Social History Narrative   ** Merged History Encounter **       Social Determinants of Health   Financial Resource Strain: Not on file  Food Insecurity: Not on file  Transportation Needs: Not on file  Physical Activity: Not on file  Stress: Not on file  Social Connections: Not on file  Intimate Partner Violence: Not on file    Family History  Problem Relation Age of Onset   Psychiatric Illness Mother    Hepatitis Father    Alcohol abuse Father    Breast cancer Paternal Aunt    Colon cancer Neg Hx    Stomach cancer Neg Hx    Colon polyps Neg Hx    Esophageal cancer Neg Hx    Rectal cancer Neg Hx     Anti-infectives: Anti-infectives (From admission, onward)    None       Current Outpatient Medications  Medication Sig Dispense Refill   albuterol (VENTOLIN HFA) 108 (90 Base) MCG/ACT inhaler Inhale 2 puffs into the lungs every 6 (six) hours as needed for wheezing or shortness of breath. 8.5 g 6   ALPRAZolam (XANAX) 0.5 MG tablet Take 1/2 to 1 tablet by mouth daily. 30 tablet 0   aspirin 81 MG chewable tablet Chew 1 tablet (81 mg total) by mouth daily.     atorvastatin (LIPITOR) 80 MG tablet Take 1 tablet (80 mg total) by mouth daily. 90 tablet 0   bictegravir-emtricitabine-tenofovir AF (BIKTARVY) 50-200-25 MG TABS tablet Take 1 tablet by mouth daily. 90 tablet 3   linaclotide (LINZESS) 145 MCG CAPS capsule Take 1 capsule (145 mcg total) by mouth daily before breakfast. 30 capsule 0   pantoprazole (PROTONIX) 40 MG tablet Take 1 tablet by mouth twice a day. 60 tablet 3   polyethylene glycol (MIRALAX / GLYCOLAX) 17 g packet Take 17 g by mouth 2 (two) times daily as needed for mild constipation. 14 each 0   QUEtiapine (SEROQUEL) 400 MG tablet Take 1 tablet (400 mg total) by mouth at bedtime. 30 tablet 3   sucralfate (CARAFATE) 1 g tablet Take 1 tablet (1 g total) by mouth 4 (four) times daily -  with  meals and at bedtime. 90 tablet 0   valACYclovir (VALTREX) 1000 MG tablet Take 1,000 mg by mouth See admin instructions. Take 1 tablet by mouth three times daily ONLY when having an active break out     Vibegron (GEMTESA) 75 MG TABS Take 75 mg by mouth daily. 42 tablet 0   No current facility-administered medications for this visit.     Objective: Vital signs in last 24 hours: BP (!) 136/94   Pulse 83   Intake/Output from previous day: No intake/output data recorded. Intake/Output this shift: _0 @   Physical Exam Vitals reviewed.  Constitutional:  Appearance: Normal appearance.  HENT:     Head: Normocephalic and atraumatic.  Cardiovascular:     Rate and Rhythm: Normal rate and regular rhythm.     Heart sounds: Normal heart sounds.  Pulmonary:     Effort: Pulmonary effort is normal. No respiratory distress.     Breath sounds: Normal breath sounds.  Abdominal:     General: Abdomen is flat. There is no distension.     Palpations: Abdomen is soft. There is no mass.     Hernia: No hernia is present.  Genitourinary:    Comments: Nl external genitialia Mild introital stenosis. Normal meatus. Urethral hypermobility with leakage with straining suggestive of ISD. Bladder normal. Mod-severe vaginal atrophy. No cystocele or rectocele. Cervix unremarkable.  Musculoskeletal:        General: No swelling or tenderness. Normal range of motion.  Skin:    General: Skin is warm and dry.     Comments: vitiligo  Neurological:     Mental Status: She is alert and oriented to person, place, and time.     Motor: Weakness (Mild left hemiparesis) present.  Psychiatric:        Mood and Affect: Mood normal.        Behavior: Behavior normal.     Lab Results:  Results for orders placed or performed in visit on 07/26/21 (from the past 24 hour(s))  Urinalysis, Routine w reflex microscopic     Status: None   Collection Time: 07/26/21  8:54 AM  Result Value Ref Range    Specific Gravity, UA 1.025 1.005 - 1.030   pH, UA 5.5 5.0 - 7.5   Color, UA Yellow Yellow   Appearance Ur Clear Clear   Leukocytes,UA Negative Negative   Protein,UA Negative Negative/Trace   Glucose, UA Negative Negative   Ketones, UA Negative Negative   RBC, UA Negative Negative   Bilirubin, UA Negative Negative   Urobilinogen, Ur 0.2 0.2 - 1.0 mg/dL   Nitrite, UA Negative Negative   Microscopic Examination Comment    Narrative   Performed at:  [facility] [address], [place], [place]  [id number] Lab Director: Mina Marble MT, Phone:  [phone]    Recent Results (from the past 2160 hour(s))  T-helper cell (CD4)- ([facility] clinic only)     Status: None   Collection Time: 05/29/21 11:17 AM  Result Value Ref Range   CD4 T Cell Abs 706 400 - 1,790 /uL   CD4 % Helper T Cell 44 33 - 65 %    Comment: Performed at [facility], [place] [address], [place], [place] [address]  COMPLETE METABOLIC PANEL WITH GFR     Status: Abnormal   Collection Time: 05/29/21 11:39 AM  Result Value Ref Range   Glucose, Bld 109 (H) 65 - 99 mg/dL    Comment: .            Fasting reference interval . For someone without known diabetes, a glucose value between 100 and 125 mg/dL is consistent with prediabetes and should be confirmed with a follow-up test. .    BUN 12 7 - 25 mg/dL   Creat 0.86 0.50 - 1.03 mg/dL   eGFR 78 > OR = 60 mL/min/1.68m    Comment: The eGFR is based on the CKD-EPI 2021 equation. To calculate  the new eGFR from a previous Creatinine or Cystatin C result, go to https://www.kidney.org/professionals/ kdoqi/gfr%5Fcalculator    BUN/Creatinine Ratio NOT APPLICABLE 6 - 22 (calc)  Sodium 140 135 - 146 mmol/L   Potassium 4.4 3.5 - 5.3 mmol/L   Chloride 104 98 - 110 mmol/L   CO2 28 20 - 32 mmol/L   Calcium 9.7 8.6 - 10.4 mg/dL   Total Protein 7.2 6.1 - 8.1 g/dL   Albumin 4.4 3.6 - 5.1 g/dL   Globulin 2.8 1.9 - 3.7 g/dL (calc)   AG Ratio 1.6 1.0 -  2.5 (calc)   Total Bilirubin 0.5 0.2 - 1.2 mg/dL   Alkaline phosphatase (APISO) 107 37 - 153 U/L   AST 21 10 - 35 U/L   ALT 21 6 - 29 U/L  HIV-1 RNA quant-no reflex-bld     Status: Abnormal   Collection Time: 05/29/21 11:39 AM  Result Value Ref Range   HIV 1 RNA Quant <20 DETECTED (A) NOT DETECTED copies/mL   HIV-1 RNA Quant, Log <1.30 DETECTED (A) NOT DETECTED Log copies/mL    Comment: . HIV-1 RNA was detected below 20 copies/mL. Viral nucleic acid detected below this level cannot be quantified by the assay.  . . This test was performed using Real-Time Polymerase Chain Reaction. . Reportable Range: 20 copies/mL to 10,000,000 copies/mL (1.30 log copies/mL to 7.00 log copies/mL).   Hepatitis C RNA quantitative     Status: None   Collection Time: 05/29/21 11:39 AM  Result Value Ref Range   HCV RNA, PCR, QN <15 IU/mL    Comment: HCV RNA Not Detected   HCV Quantitative Log <1.18 log IU/mL    Comment: HCV RNA Not Detected . Reference Range:                          Not Detected       IU/mL                          Not Detected   Log IU/mL . This test was performed using Real-Time Polymerase Chain Reaction. . Reportable range is 15 IU/mL to 100,000,000 IU/mL (1.18 Log IU/mL to 8.00 Log IU/mL). . For additional information please refer to http://education.questdiagnostics.com/faq/FAQ22v1 (This link is being provided for informational/ educational purposes only.) . The analytical performance characteristics of this assay have been determined by [facility], [place], [place].  The modifications have not been cleared or approved by the FDA.  This assay has been validated pursuant to the CLIA regulations and is used for clinical purposes. .   Liver Fibrosis, FibroTest-ActiTest     Status: Abnormal   Collection Time: 05/29/21 11:39 AM  Result Value Ref Range   Fibrosis Score 0.50    Fibrosis Stage F2    Fibrosis Interpretation SEE NOTE     Comment:  . moderate fibrosis . Fibro Test Score (f)  Metavir Score   f>=0 and f<=0.21 : F0 (no fibrosis) f>0.21 and f<=0.27 : F0-F1 (no fibrosis) f>0.27 and f<=0.31 : F1 (minimal fibrosis) f>0.31 and f<=0.48 : F1-F2 (minimal fibrosis) f>0.48 and f<=0.58 : F2 (moderate fibrosis) f>0.58 and f<=0.72 : F3 (advanced fibrosis) f>0.72 and f<=0.74 : F3-F4 (advanced fibrosis) f>0.74 and f<=1.00 : F4 (severe fibrosis)    Necroinflammat ACT Score 0.11    Necroinflammat Act Grade A0    Necroinflammat Interp SEE NOTE     Comment: . no activity . ActiTest Score (a)    Metavir Score   a>=0 and a<=0.17 : A0 (no activity) a>0.17 and a<=0.29 : A0-A1 (no activity) a>0.29 and a<=0.36 : A1 (  minimal activity) a>0.36 and a<=0.52 : A1-A2 (minimal activity) a>0.52 and a<=0.60 : A2 (significant activity) a>0.60 and a<=0.62 : A2-A3 (significant activity) a>0.62 and a<=1.00 : A3 (severe activity)    Alpha-2-Macroglobulin 424 (H) 106 - 279 mg/dL   Haptoglobin 134 43 - 212 mg/dL   Apolipoprotein A1 150 101 - 198 mg/dL   Bilirubin 0.6 0.2 - 1.2 mg/dL   GGT 17 3 - 70 U/L   ALT 21 6 - 29 U/L   Reference ID [id number]    Footnote SEE NOTE     Comment: . The reliability of results is dependent on compliance with the preanalytical and analytical conditions recommended by [employer]. The tests have to be deferred for: acute hemolysis, acute hepatitis, acute inflammation, extra hepatic cholestasis. The advice of a specialist should be sought for interpretation in chronic hemolysis and Gilbert's syndrome. The test interpretation is not validated in liver transplant patients. Isolated extreme values of one of the components should lead to caution in interpreting the results. In case of discordance between a biopsy result and a test, it is recommended to seek the advice of a specialist. The causes of these discordances could be due to a flaw of the test or to a flaw in the biopsy: i.e. a liver biopsy has a 33%  variability rate for one fibrosis stage. FibroTest is interpretable for chronic hepatitis B and C, alcoholic and non alcoholic steatosis. ActiTest is interpretable for chronic hepatitis B and C. . The performanc e characteristics have been determined by [facility], [place]. It has not been cleared or approved by the U.S. Food and Drug Administration. Performance characteristics refer to the analytical performance of the test. . [employer], [employer], the associated logo, [employer] and all associated [employer] marks are the registered trademarks of [employer]. All third party marks - (R) and (TM) - are the property of their respective owners. (C) 2000-2014 [employer]. All rights reserved. .   Urinalysis, Reflex Microscopic     Status: None   Collection Time: 06/19/21  9:07 AM  Result Value Ref Range   Specific Gravity, UA 1.010 1.005 - 1.030   pH, UA 6.0 5.0 - 7.5   Color, UA Yellow Yellow   Appearance Ur Clear Clear   Leukocytes,UA Negative Negative   Protein,UA Negative Negative/Trace   Glucose, UA Negative Negative   Ketones, UA Negative Negative   RBC, UA Negative Negative   Bilirubin, UA Negative Negative   Urobilinogen, Ur 0.2 0.2 - 1.0 mg/dL   Nitrite, UA Negative Negative   Microscopic Examination Comment     Comment: Microscopic not indicated and not performed.  POCT Urinalysis Dipstick (62263)     Status: None   Collection Time: 06/19/21 11:18 AM  Result Value Ref Range   Color, UA Yellow    Clarity, UA Clear    Glucose, UA Negative Negative   Bilirubin, UA Negative    Ketones, UA Negative    Spec Grav, UA 1.010 1.010 - 1.025   Blood, UA Negative    pH, UA 6.0 5.0 - 8.0   Protein, UA Negative Negative   Urobilinogen, UA 0.2 0.2 or 1.0 E.U./dL   Nitrite, UA Negative    Leukocytes, UA Negative Negative  BLADDER SCAN AMB NON-IMAGING     Status: None   Collection Time: 07/26/21   8:50 AM  Result Value Ref Range   Scan Result 5   Urinalysis, Routine w reflex microscopic  Status: None   Collection Time: 07/26/21  8:54 AM  Result Value Ref Range   Specific Gravity, UA 1.025 1.005 - 1.030   pH, UA 5.5 5.0 - 7.5   Color, UA Yellow Yellow   Appearance Ur Clear Clear   Leukocytes,UA Negative Negative   Protein,UA Negative Negative/Trace   Glucose, UA Negative Negative   Ketones, UA Negative Negative   RBC, UA Negative Negative   Bilirubin, UA Negative Negative   Urobilinogen, Ur 0.2 0.2 - 1.0 mg/dL   Nitrite, UA Negative Negative   Microscopic Examination Comment     Comment: Microscopic follows if indicated.    BMET No results for input(s): "NA", "K", "CL", "CO2", "GLUCOSE", "BUN", "CREATININE", "CALCIUM" in the last 72 hours. PT/INR No results for input(s): "LABPROT", "INR" in the last 72 hours. ABG No results for input(s): "PHART", "HCO3" in the last 72 hours.  Invalid input(s): "PCO2", "PO2"  Studies/Results: No results found. CT report from 7/22 reviewed.  No GU findings noted.   Assessment/Plan: Mixed incontinence with urgency.   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  I will start her on Gemtesa samples and reviewed the side effects.  She has HTN so Myrbetriq is not a good choice.   I will refer her to [facility] to one of our incontinence providers for further evaluation.    Atrophic vaginitis.   She may need premarin cream but has an intact uterus so I didn't start that today.   Meds ordered this encounter  Medications   Vibegron (GEMTESA) 75 MG TABS    Sig: Take 75 mg by mouth daily.    Dispense:  42 tablet    Refill:  0     Orders Placed This Encounter  Procedures   Urinalysis, Routine w reflex microscopic   Ambulatory referral to Urology    Referral Priority:   Routine    Referral Type:   Consultation    Referral Reason:   Specialty Services Required    Referred to Provider:   Robley Fries,  MD    Requested Specialty:   Urology    Number of Visits Requested:   1   BLADDER SCAN AMB NON-IMAGING     Return for Next available referral to Dr. Claudia Desanctis or Dr. Matilde Sprang in [place] for incontinence management. .    CC: Timothy Lasso MD     Mackenzie Gonzalez 07/27/2021 [phone]

## 2021-08-02 ENCOUNTER — Ambulatory Visit: Payer: Medicaid Other | Admitting: Registered Nurse

## 2021-08-06 ENCOUNTER — Other Ambulatory Visit (HOSPITAL_COMMUNITY): Payer: Self-pay

## 2021-08-09 ENCOUNTER — Other Ambulatory Visit (HOSPITAL_COMMUNITY): Payer: Self-pay

## 2021-08-10 ENCOUNTER — Other Ambulatory Visit (HOSPITAL_COMMUNITY): Payer: Self-pay

## 2021-08-13 ENCOUNTER — Other Ambulatory Visit (HOSPITAL_COMMUNITY): Payer: Self-pay

## 2021-08-16 ENCOUNTER — Other Ambulatory Visit (HOSPITAL_COMMUNITY): Payer: Self-pay

## 2021-08-22 ENCOUNTER — Other Ambulatory Visit (HOSPITAL_COMMUNITY): Payer: Self-pay

## 2021-09-12 ENCOUNTER — Encounter (HOSPITAL_COMMUNITY): Payer: Self-pay | Admitting: Psychiatry

## 2021-09-12 ENCOUNTER — Telehealth (INDEPENDENT_AMBULATORY_CARE_PROVIDER_SITE_OTHER): Payer: Medicaid Other | Admitting: Psychiatry

## 2021-09-12 DIAGNOSIS — F250 Schizoaffective disorder, bipolar type: Secondary | ICD-10-CM

## 2021-09-12 DIAGNOSIS — F411 Generalized anxiety disorder: Secondary | ICD-10-CM

## 2021-09-12 MED ORDER — QUETIAPINE FUMARATE 400 MG PO TABS: 400.0000 mg | ORAL_TABLET | Freq: Every day | ORAL | 3 refills | Status: DC

## 2021-09-12 MED ORDER — PROPRANOLOL HCL 10 MG PO TABS: 10.0000 mg | ORAL_TABLET | Freq: Three times a day (TID) | ORAL | 3 refills | Status: DC
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
## 2021-10-08 ENCOUNTER — Other Ambulatory Visit (HOSPITAL_COMMUNITY): Payer: Self-pay

## 2021-10-08 ENCOUNTER — Other Ambulatory Visit: Payer: Self-pay | Admitting: Urology

## 2021-10-09 ENCOUNTER — Other Ambulatory Visit (HOSPITAL_COMMUNITY): Payer: Self-pay

## 2021-10-09 ENCOUNTER — Encounter (HOSPITAL_BASED_OUTPATIENT_CLINIC_OR_DEPARTMENT_OTHER): Payer: Self-pay | Admitting: Urology

## 2021-10-09 NOTE — Progress Notes (Addendum)
Addendum:  Received call from Dr Claudia Desanctis office, pt to continue asa does not need to stop prior to surgery per Dr Claudia Desanctis.  Called and left message for pt told to continue her asa do not stop but do not take morning of surgery, requested to call back to confirm she received message.   Spoke w/ via phone for pre-op interview--- pt Lab needs dos---- no              Lab results------ no COVID test -----patient states asymptomatic no test needed Arrive at ------- 0745 on 10-16-2021 NPO after MN NO Solid Food.  Clear liquids from MN until--- 0645 Med rec completed Medications to take morning of surgery ----- lipitor, biktarvy, protonix, gemtesa, and propranolol if she has started it after getting from her pharmacy Diabetic medication ----- n/a Patient instructed no nail polish to be worn day of surgery Patient instructed to bring photo id and insurance card day of surgery Patient aware to have Driver (ride ) / caregiver for 24 hours after surgery --- step-mother, linda Patient Special Instructions ----- n/a Pre-Op special Istructions ----- pt stated was given instruction from Dr Claudia Desanctis to stop ASA 5 days prior to surgery, stated last dose will be 10-10-2021 Pt has residual mild left hemiparesis post stroke, uses cane Patient verbalized understanding of instructions that were given at this phone interview. Patient denies shortness of breath, chest pain, fever, cough at this phone interview.

## 2021-10-10 ENCOUNTER — Other Ambulatory Visit (HOSPITAL_COMMUNITY): Payer: Self-pay

## 2021-10-10 NOTE — H&P (Signed)
CC/HPI: cc: Mixed urinary incontinence   08/15/2021: 61 year old woman initially seen by Dr. Jeffie Pollock in [place] for mixed urinary incontinence. She was started on Gemtesa which has improved the urgency. She is still wearing 4 diapers per 24 period. She endorses leaking with coughing and sneezing. She has a history of a C-section. She does not get frequent UTIs and denies any gross hematuria.   Overactive bladder validated question screener: 5, 5, 5, 3, 3, 5, 5, 5 = 36/40   10/05/21: Here for cystoscopy/pelvic exam as part of SUI work-up.     ALLERGIES: Haldol    MEDICATIONS: Gemtesa 75 mg tablet  Aspirin Ec 81 mg tablet, delayed release  Atorvastatin Calcium 80 mg tablet  Biktarvy 50 mg-200 mg-25 mg tablet  Pantoprazole Sodium 40 mg tablet, delayed release  Venlafaxine Hcl     GU PSH: No GU PSH      PSH Notes: Wrist surgery Right and left.   NON-GU PSH: Breast Biopsy Cesarean Delivery     GU PMH: Mixed incontinence - 08/15/2021      PMH Notes: Hepatitis.  Stomach ulcers.   NON-GU PMH: Anxiety Arthritis Depression GERD HIV Hypercholesterolemia Hypertension Stroke/TIA    FAMILY HISTORY: 1 son - Runs in Family Diabetes - Mother High Blood Pressure - Sister, Brother   SOCIAL HISTORY: Marital Status: Single Preferred Language: English; Ethnicity: Not Hispanic Or Latino; Race: White Current Smoking Status: Patient smokes.   Tobacco Use Assessment Completed: Used Tobacco in last 30 days? Has never drank.  Drinks 2 caffeinated drinks per day.    REVIEW OF SYSTEMS:    GU Review Female:   Patient denies frequent urination, hard to postpone urination, burning /pain with urination, get up at night to urinate, leakage of urine, stream starts and stops, trouble starting your stream, have to strain to urinate, and being pregnant.  Gastrointestinal (Upper):   Patient denies nausea, vomiting, and indigestion/ heartburn.  Gastrointestinal (Lower):   Patient denies diarrhea and  constipation.  Constitutional:   Patient denies fever, night sweats, weight loss, and fatigue.  Skin:   Patient denies skin rash/ lesion and itching.  Eyes:   Patient denies blurred vision and double vision.  Ears/ Nose/ Throat:   Patient denies sore throat and sinus problems.  Hematologic/Lymphatic:   Patient denies swollen glands and easy bruising.  Cardiovascular:   Patient denies leg swelling and chest pains.  Respiratory:   Patient denies cough and shortness of breath.  Endocrine:   Patient denies excessive thirst.  Musculoskeletal:   Patient denies back pain and joint pain.  Neurological:   Patient denies headaches and dizziness.  Psychologic:   Patient denies depression and anxiety.   VITAL SIGNS: None   GU PHYSICAL EXAMINATION:    External Genitalia: No hirsutism, no rash, no scarring, no cyst, no erythematous lesion, no papular lesion, no blanched lesion, no warty lesion. No edema.  Urethral Meatus: Normal size. Normal position. No discharge.  Bladder: Normal to palpation, no tenderness, no mass, normal size.  Vagina: Moderate vaginal atrophy. No stenosis. No rectocele. No cystocele. No enterocele.    MULTI-SYSTEM PHYSICAL EXAMINATION:    Constitutional: Well-nourished. No physical deformities. Normally developed. Good grooming.  Neck: Neck symmetrical, not swollen. Normal tracheal position.  Respiratory: No labored breathing, no use of accessory muscles.   Skin: No paleness, no jaundice, no cyanosis. No lesion, no ulcer, no rash.  Neurologic / Psychiatric: Oriented to time, oriented to place, oriented to person. No depression, no anxiety, no agitation.  Eyes: Normal conjunctivae. Normal eyelids.  Ears, Nose, Mouth, and Throat: Left ear no scars, no lesions, no masses. Right ear no scars, no lesions, no masses. Nose no scars, no lesions, no masses. Normal hearing. Normal lips.  Musculoskeletal: Normal gait and station of head and neck.     Complexity of Data:  Records  Review:   Previous Patient Records, POC Tool  Urine Test Review:   Urinalysis   PROCEDURES:         Flexible Cystoscopy - 52000  Risks, benefits, and some of the potential complications of the procedure were discussed at length with the patient including infection, bleeding, voiding discomfort, urinary retention, fever, chills, sepsis, and others. All questions were answered. Informed consent was obtained. Antibiotic prophylaxis was given. Sterile technique and intraurethral analgesia were used.  Meatus:  Normal size. Normal location. Normal condition.  Urethra:  Q - tip test positive. +hypermobility. +SUI with cough in dosral lithotomy  Ureteral Orifices:  Normal location. Normal size. Normal shape. Effluxed clear urine.  Bladder:  No trabeculation. No tumors. Normal mucosa. No stones.      The lower urinary tract was carefully examined. The procedure was well-tolerated and without complications. Antibiotic instructions were given. Instructions were given to call the office immediately for bloody urine, difficulty urinating, urinary retention, painful or frequent urination, fever, chills, nausea, vomiting or other illness. The patient stated that she understood these instructions and would comply with them.         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 Urgency:  -gemtesa samples give and prescription sent to pharmacy   2. SUI:  -discussed results of  cystoscopy which confirmed SUI  -We had previously discussed Bulkamid versus mid urethral sling. Patient would like to proceed with Bulkamid as first line. This is reasonable as she does have moderate atrophic vaginitis as well. Risks and benefits of Bulkamid discussed with the patient including bleeding, infection, urinary retention and failure of procedure to work. We discussed that it has a 70% success rate. We will schedule next available date in the surgery center under sedation.

## 2021-10-11 ENCOUNTER — Telehealth: Payer: Self-pay

## 2021-10-11 ENCOUNTER — Ambulatory Visit: Payer: Medicaid Other | Attending: Physician Assistant | Admitting: Physician Assistant

## 2021-10-11 DIAGNOSIS — Z01810 Encounter for preprocedural cardiovascular examination: Secondary | ICD-10-CM | POA: Insufficient documentation

## 2021-10-11 NOTE — Telephone Encounter (Signed)
   Pre-operative Risk Assessment    Patient Name: Mackenzie Gonzalez  DOB: [birth year] MRN: [id number]      Request for Surgical Clearance    Procedure:   Bulkamid Procedure (cystoscopy)  Date of Surgery:  Clearance 10/16/21                                 Surgeon:  Jacalyn Lefevre MD Surgeon's Group or Practice Name:  [facility] Phone number:  [phone] Fax number:  [phone]   Type of Clearance Requested:   - Medical /Pharmacy   Type of Anesthesia:   Sedation   Additional requests/questions:  Please advise surgeon/provider what medications should be held. Please fax a copy of clearance to the surgeon's office.  Signed, Taeja Debellis B Jaelyn Cloninger   10/11/2021, 9:10 AM

## 2021-10-11 NOTE — Telephone Encounter (Signed)
  Patient Consent for Virtual Visit        Mackenzie Gonzalez has provided verbal consent on 10/11/2021 for a virtual visit (video or telephone).   CONSENT FOR VIRTUAL VISIT FOR:  Mackenzie Gonzalez  By participating in this virtual visit I agree to the following:  I hereby voluntarily request, consent and authorize [facility] and its employed or contracted physicians, physician assistants, nurse practitioners or other licensed health care professionals (the Practitioner), to provide me with telemedicine health care services (the "Services") as deemed necessary by the treating Practitioner. I acknowledge and consent to receive the Services by the Practitioner via telemedicine. I understand that the telemedicine visit will involve communicating with the Practitioner through live audiovisual communication technology and the disclosure of certain medical information by electronic transmission. I acknowledge that I have been given the opportunity to request an in-person assessment or other available alternative prior to the telemedicine visit and am voluntarily participating in the telemedicine visit.  I understand that I have the right to withhold or withdraw my consent to the use of telemedicine in the course of my care at any time, without affecting my right to future care or treatment, and that the Practitioner or I may terminate the telemedicine visit at any time. I understand that I have the right to inspect all information obtained and/or recorded in the course of the telemedicine visit and may receive copies of available information for a reasonable fee.  I understand that some of the potential risks of receiving the Services via telemedicine include:  Delay or interruption in medical evaluation due to technological equipment failure or disruption; Information transmitted may not be sufficient (e.g. poor resolution of images) to allow for appropriate medical decision making by the Practitioner;  and/or  In rare instances, security protocols could fail, causing a breach of personal health information.  Furthermore, I acknowledge that it is my responsibility to provide information about my medical history, conditions and care that is complete and accurate to the best of my ability. I acknowledge that Practitioner's advice, recommendations, and/or decision may be based on factors not within their control, such as incomplete or inaccurate data provided by me or distortions of diagnostic images or specimens that may result from electronic transmissions. I understand that the practice of medicine is not an exact science and that Practitioner makes no warranties or guarantees regarding treatment outcomes. I acknowledge that a copy of this consent can be made available to me via my patient portal ([facility]), or I can request a printed copy by calling the office of [facility].    I understand that my insurance will be billed for this visit.   I have read or had this consent read to me. I understand the contents of this consent, which adequately explains the benefits and risks of the Services being provided via telemedicine.  I have been provided ample opportunity to ask questions regarding this consent and the Services and have had my questions answered to my satisfaction. I give my informed consent for the services to be provided through the use of telemedicine in my medical care

## 2021-10-11 NOTE — Telephone Encounter (Signed)
Pt is scheduled for Telephone Visit on 10/11/21 @ 4/20pm.

## 2021-10-11 NOTE — Telephone Encounter (Signed)
   Name: Mackenzie Gonzalez  DOB: 06-Sep-1960  MRN: [id number]  Primary Cardiologist: Harl Bowie   Preoperative team, please contact this patient and set up a phone call appointment for further preoperative risk assessment. Please obtain consent and complete medication review. Thank you for your help. Will notify callback team to add on today.  From cardiac standpoint no specific contraindication to holding ASA but patient has h/o of stroke so would recommend surgical team discuss with PCP if needing to hold.   Charlie Pitter, PA-C 10/11/2021, 1:34 PM [place]
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
## 2021-10-12 ENCOUNTER — Telehealth: Payer: Self-pay | Admitting: Pharmacist

## 2021-10-12 ENCOUNTER — Other Ambulatory Visit (HOSPITAL_COMMUNITY): Payer: Self-pay

## 2021-10-12 NOTE — Telephone Encounter (Signed)
Patient called Mackenzie Gonzalez stating she can't find her [place]. Dispense report shows she received 90-day supply on 7/25. She denies losing her medicine and says she "just can't find it". She has an older bottle that has 8 tablets of Biktarvy left in it. Mackenzie Gonzalez recommended she come and pick up 28 days of samples.   Patient asked if she could just take Genvoya in place for Biktarvy at this time; reviewed that she has multiple new drugs that interact with Genvoya. Looks like she has stopped taking Plavix but is taking atorvastatin now. Reviewed she must come to clinic for samples until we can renew her prescription at the end of October.  She wants to switch pharmacies to [facility] [place] since she claims this current pharmacy is only giving her 30-day supplies when they say they are giving 90-day supplies.  Alfonse Spruce, PharmD, CPP, [place] Clinical Pharmacist Practitioner [facility]

## 2021-10-16 ENCOUNTER — Ambulatory Visit (HOSPITAL_BASED_OUTPATIENT_CLINIC_OR_DEPARTMENT_OTHER): Payer: Medicaid Other | Admitting: Anesthesiology

## 2021-10-16 ENCOUNTER — Encounter (HOSPITAL_BASED_OUTPATIENT_CLINIC_OR_DEPARTMENT_OTHER)
Admission: RE | Disposition: A | Discharge status: Home / Self Care | Payer: Self-pay | Source: Home / Self Care | Attending: Urology

## 2021-10-16 ENCOUNTER — Encounter (HOSPITAL_BASED_OUTPATIENT_CLINIC_OR_DEPARTMENT_OTHER): Payer: Self-pay | Admitting: Urology

## 2021-10-16 ENCOUNTER — Other Ambulatory Visit: Payer: Self-pay

## 2021-10-16 ENCOUNTER — Ambulatory Visit (HOSPITAL_BASED_OUTPATIENT_CLINIC_OR_DEPARTMENT_OTHER)
Admission: RE | Admit: 2021-10-16 | Discharge: 2021-10-16 | Disposition: A | Discharge status: Home / Self Care | Payer: Medicaid Other | Attending: Urology | Admitting: Urology

## 2021-10-16 DIAGNOSIS — K219 Gastro-esophageal reflux disease without esophagitis: Secondary | ICD-10-CM | POA: Insufficient documentation

## 2021-10-16 DIAGNOSIS — F209 Schizophrenia, unspecified: Secondary | ICD-10-CM | POA: Diagnosis not present

## 2021-10-16 DIAGNOSIS — N3641 Hypermobility of urethra: Secondary | ICD-10-CM | POA: Diagnosis not present

## 2021-10-16 DIAGNOSIS — Z6832 Body mass index (BMI) 32.0-32.9, adult: Secondary | ICD-10-CM | POA: Diagnosis not present

## 2021-10-16 DIAGNOSIS — I69354 Hemiplegia and hemiparesis following cerebral infarction affecting left non-dominant side: Secondary | ICD-10-CM | POA: Diagnosis not present

## 2021-10-16 DIAGNOSIS — I10 Essential (primary) hypertension: Secondary | ICD-10-CM | POA: Insufficient documentation

## 2021-10-16 DIAGNOSIS — E785 Hyperlipidemia, unspecified: Secondary | ICD-10-CM | POA: Diagnosis not present

## 2021-10-16 DIAGNOSIS — F319 Bipolar disorder, unspecified: Secondary | ICD-10-CM | POA: Insufficient documentation

## 2021-10-16 DIAGNOSIS — N952 Postmenopausal atrophic vaginitis: Secondary | ICD-10-CM | POA: Insufficient documentation

## 2021-10-16 DIAGNOSIS — E669 Obesity, unspecified: Secondary | ICD-10-CM | POA: Insufficient documentation

## 2021-10-16 DIAGNOSIS — N393 Stress incontinence (female) (male): Secondary | ICD-10-CM

## 2021-10-16 DIAGNOSIS — F17210 Nicotine dependence, cigarettes, uncomplicated: Secondary | ICD-10-CM | POA: Diagnosis not present

## 2021-10-16 DIAGNOSIS — F17200 Nicotine dependence, unspecified, uncomplicated: Secondary | ICD-10-CM | POA: Diagnosis not present

## 2021-10-16 DIAGNOSIS — Z79899 Other long term (current) drug therapy: Secondary | ICD-10-CM | POA: Insufficient documentation

## 2021-10-16 DIAGNOSIS — N3946 Mixed incontinence: Secondary | ICD-10-CM | POA: Insufficient documentation

## 2021-10-16 DIAGNOSIS — Z01818 Encounter for other preprocedural examination: Secondary | ICD-10-CM

## 2021-10-16 HISTORY — DX: Other psychoactive substance abuse, in remission: F19.11

## 2021-10-16 HISTORY — DX: Personal history of colonic polyps: Z86.010

## 2021-10-16 HISTORY — DX: Generalized anxiety disorder: F41.1

## 2021-10-16 HISTORY — DX: Other sequelae of cerebral infarction: I69.398

## 2021-10-16 HISTORY — DX: Personal history of adenomatous and serrated colon polyps: Z86.0101

## 2021-10-16 HISTORY — DX: Personal history of other diseases of the respiratory system: Z87.09

## 2021-10-16 HISTORY — DX: Mixed hyperlipidemia: E78.2

## 2021-10-16 HISTORY — PX: CYSTOSCOPY WITH INJECTION: SHX1424

## 2021-10-16 HISTORY — DX: Mixed incontinence: N39.46

## 2021-10-16 HISTORY — DX: Dyspnea, unspecified: R06.00

## 2021-10-16 HISTORY — DX: Personal history of other infectious and parasitic diseases: Z86.19

## 2021-10-16 HISTORY — DX: Chronic pain syndrome: G89.4

## 2021-10-16 HISTORY — DX: Diverticulosis of large intestine without perforation or abscess without bleeding: K57.30

## 2021-10-16 HISTORY — DX: Hemiplegia and hemiparesis following cerebral infarction affecting left non-dominant side: I69.354

## 2021-10-16 SURGERY — CYSTOSCOPY, WITH INJECTION OF BLADDER NECK OR BLADDER WALL
Anesthesia: General | Site: Urethra

## 2021-10-16 MED ORDER — PROPOFOL 10 MG/ML IV BOLUS
INTRAVENOUS | Status: DC | PRN
  Administered 2021-10-16: 20 mg via INTRAVENOUS
  Administered 2021-10-16: 200 mg via INTRAVENOUS

## 2021-10-16 MED ORDER — FENTANYL CITRATE (PF) 100 MCG/2ML IJ SOLN: 25.0000 ug | INTRAMUSCULAR | Status: DC | PRN

## 2021-10-16 MED ORDER — DEXAMETHASONE SODIUM PHOSPHATE 10 MG/ML IJ SOLN
INTRAMUSCULAR | Status: DC | PRN
  Administered 2021-10-16: 5 mg via INTRAVENOUS

## 2021-10-16 MED ORDER — LACTATED RINGERS IV SOLN: INTRAVENOUS | Status: DC

## 2021-10-16 MED ORDER — ONDANSETRON HCL 4 MG/2ML IJ SOLN: 4.0000 mg | Freq: Once | INTRAMUSCULAR | Status: DC | PRN

## 2021-10-16 MED ORDER — CEFAZOLIN SODIUM-DEXTROSE 2-4 GM/100ML-% IV SOLN
2.0000 g | INTRAVENOUS | Status: AC
  Administered 2021-10-16: 2 g via INTRAVENOUS

## 2021-10-16 MED ORDER — OXYCODONE HCL 5 MG/5ML PO SOLN: 5.0000 mg | Freq: Once | ORAL | Status: DC | PRN

## 2021-10-16 MED ORDER — FENTANYL CITRATE (PF) 250 MCG/5ML IJ SOLN
INTRAMUSCULAR | Status: DC | PRN
  Administered 2021-10-16: 50 ug via INTRAVENOUS

## 2021-10-16 MED ORDER — PROPOFOL 10 MG/ML IV BOLUS
INTRAVENOUS | Status: AC
  Filled 2021-10-16: qty 20

## 2021-10-16 MED ORDER — MIDAZOLAM HCL 2 MG/2ML IJ SOLN
INTRAMUSCULAR | Status: AC
  Filled 2021-10-16: qty 2

## 2021-10-16 MED ORDER — CEFAZOLIN SODIUM-DEXTROSE 2-4 GM/100ML-% IV SOLN
INTRAVENOUS | Status: AC
  Filled 2021-10-16: qty 100

## 2021-10-16 MED ORDER — SODIUM CHLORIDE 0.9 % IR SOLN
Status: DC | PRN
  Administered 2021-10-16: 1500 mL

## 2021-10-16 MED ORDER — MIDAZOLAM HCL 2 MG/2ML IJ SOLN
INTRAMUSCULAR | Status: DC | PRN
  Administered 2021-10-16: 1 mg via INTRAVENOUS

## 2021-10-16 MED ORDER — FENTANYL CITRATE (PF) 100 MCG/2ML IJ SOLN
INTRAMUSCULAR | Status: AC
  Filled 2021-10-16: qty 2

## 2021-10-16 MED ORDER — ONDANSETRON HCL 4 MG/2ML IJ SOLN
INTRAMUSCULAR | Status: DC | PRN
  Administered 2021-10-16: 4 mg via INTRAVENOUS

## 2021-10-16 MED ORDER — OXYCODONE HCL 5 MG PO TABS: 5.0000 mg | ORAL_TABLET | Freq: Once | ORAL | Status: DC | PRN

## 2021-10-16 MED ORDER — LIDOCAINE 2% (20 MG/ML) 5 ML SYRINGE
INTRAMUSCULAR | Status: DC | PRN
  Administered 2021-10-16: 80 mg via INTRAVENOUS

## 2021-10-16 SURGICAL SUPPLY — 20 items
BAG DRAIN URO-CYSTO SKYTR STRL (DRAIN) ×1 IMPLANT
BAG DRN UROCATH (DRAIN) ×1
CLOTH BEACON ORANGE TIMEOUT ST (SAFETY) ×1 IMPLANT
ELECT REM PT RETURN 9FT ADLT (ELECTROSURGICAL)
ELECTRODE REM PT RTRN 9FT ADLT (ELECTROSURGICAL) ×1 IMPLANT
GLOVE BIO SURGEON STRL SZ 6.5 (GLOVE) ×1 IMPLANT
GOWN STRL REUS W/TWL LRG LVL3 (GOWN DISPOSABLE) ×1 IMPLANT
IV NS IRRIG 3000ML ARTHROMATIC (IV SOLUTION) IMPLANT
KIT TURNOVER CYSTO (KITS) ×1 IMPLANT
MANIFOLD NEPTUNE II (INSTRUMENTS) ×1 IMPLANT
NDL ASPIRATION 22 (NEEDLE) ×1 IMPLANT
NDL SAFETY ECLIP 18X1.5 (MISCELLANEOUS) ×1 IMPLANT
NEEDLE ASPIRATION 22 (NEEDLE) ×1 IMPLANT
PACK CYSTO (CUSTOM PROCEDURE TRAY) ×1 IMPLANT
SYR 20ML LL LF (SYRINGE) ×1 IMPLANT
SYR CONTROL 10ML LL (SYRINGE) ×1 IMPLANT
SYSTEM URETHRAL BULK BULKAMID (Female Continence) IMPLANT
TUBE CONNECTING 12X1/4 (SUCTIONS) ×1 IMPLANT
TUBING UROLOGY SET (TUBING) ×1 IMPLANT
WATER STERILE IRR 3000ML UROMA (IV SOLUTION) ×1 IMPLANT

## 2021-10-16 NOTE — Anesthesia Procedure Notes (Signed)
Procedure Name: LMA Insertion Date/Time: 10/16/2021 10:38 AM  Performed by: Clearnce Sorrel, CRNAPre-anesthesia Checklist: Patient identified, Emergency Drugs available, Suction available and Patient being monitored Patient Re-evaluated:Patient Re-evaluated prior to induction Oxygen Delivery Method: Circle System Utilized Preoxygenation: Pre-oxygenation with 100% oxygen Induction Type: IV induction Ventilation: Mask ventilation without difficulty LMA: LMA inserted LMA Size: 4.0 Number of attempts: 1 Airway Equipment and Method: Bite block Placement Confirmation: positive ETCO2 Tube secured with: Tape Dental Injury: Teeth and Oropharynx as per pre-operative assessment

## 2021-10-16 NOTE — Anesthesia Preprocedure Evaluation (Addendum)
Anesthesia Evaluation  Patient identified by MRN, date of birth, ID band Patient awake    Reviewed: Allergy & Precautions, NPO status , Patient's Chart, lab work & pertinent test results, reviewed documented beta blocker date and time   Airway Mallampati: II  TM Distance: >3 FB Neck ROM: Full    Dental  (+) Dental Advisory Given, Missing,    Pulmonary shortness of breath and with exertion, Current Smoker and Patient abstained from smoking.,    Pulmonary exam normal breath sounds clear to auscultation       Cardiovascular hypertension, Pt. on medications Normal cardiovascular exam Rhythm:Regular Rate:Normal     Neuro/Psych PSYCHIATRIC DISORDERS Anxiety Bipolar Disorder Schizophrenia CVA 08/2020 left hemiparesis mostly resolved, still has some mild weakness. Has residual balance problems and uses a cane for ambulation CVA, Residual Symptoms    GI/Hepatic GERD  Medicated and Controlled,(+)     substance abuse  cocaine use, Hepatitis -, CRemote hx/o IVDA Cocaine and opioids- not used in years Hx/o GI bleed   Endo/Other  Obesity Hyperlipidemia  Renal/GU negative Renal ROS Bladder dysfunction  SUI    Musculoskeletal  (+) narcotic dependentChronic pain syndrome   Abdominal (+) + obese,   Peds  Hematology  (+) Blood dyscrasia, anemia ,   Anesthesia Other Findings   Reproductive/Obstetrics                            Anesthesia Physical Anesthesia Plan  ASA: 3  Anesthesia Plan: General   Post-op Pain Management: Minimal or no pain anticipated   Induction:   PONV Risk Score and Plan: 4 or greater and Treatment may vary due to age or medical condition, Ondansetron, Dexamethasone and Midazolam  Airway Management Planned: LMA  Additional Equipment: None  Intra-op Plan:   Post-operative Plan: Extubation in OR  Informed Consent: I have reviewed the patients History and Physical, chart,  labs and discussed the procedure including the risks, benefits and alternatives for the proposed anesthesia with the patient or authorized representative who has indicated his/her understanding and acceptance.     Dental advisory given  Plan Discussed with: CRNA and Anesthesiologist  Anesthesia Plan Comments:         Anesthesia Quick Evaluation

## 2021-10-16 NOTE — Anesthesia Postprocedure Evaluation (Signed)
Anesthesia Post Note  Patient: Mackenzie Gonzalez  Procedure(s) Performed: CYSTOSCOPY WITH BULKAMID (Urethra)     Patient location during evaluation: PACU Anesthesia Type: General Level of consciousness: awake and alert and oriented Pain management: pain level controlled Vital Signs Assessment: post-procedure vital signs reviewed and stable Respiratory status: spontaneous breathing, nonlabored ventilation and respiratory function stable Cardiovascular status: blood pressure returned to baseline and stable Postop Assessment: no apparent nausea or vomiting Anesthetic complications: no   No notable events documented.  Last Vitals:  Vitals:   10/16/21 1102 10/16/21 1115  BP: 107/66 123/78  Pulse: 76 63  Resp: 14 15  Temp: (!) 36.4 C   SpO2: 97% 96%    Last Pain:  Vitals:   10/16/21 0738  TempSrc: Oral  PainSc: 0-No pain                 Dreydon Cardenas A.

## 2021-10-16 NOTE — Op Note (Signed)
Operative Note  Preoperative diagnosis:  1.  Stress urinary incontinence  Postoperative diagnosis: 1.  Stress urinary incontinence  Procedure(s): 1.  Cystoscopy with Bulkamid  Surgeon: Jacalyn Lefevre, MD  Assistants:  None  Anesthesia:  General  Complications:  None  EBL: None  Specimens: 1.  None  Drains/Catheters: 1.  None  Intraoperative findings:   Normal urethral meatus Hypermobility of urethra Bilateral orthotopic ureteral orifices effluxing clear urine Bladder mucosa  Indication:  Mackenzie Gonzalez is a 61 y.o. female with stress predominant mixed urinary incontinence here for cystoscopy with Bulkamid.  Description of procedure:  After risks and benefits of the procedure discussed with the patient, informed consent was obtained.  The patient was taken to the operating placed in the supine position.  Anesthesia was induced and antibiotics were administered.  The patient was then repositioned in the dorsolithotomy position.  She was prepped and draped in usual sterile fashion a timeout performed with the attending present.  Urethral sounds were used dilate the meatus from 22-28Fr. The cystoscope was assembled with the Bulkamid system.  It was then placed in the urethral meatus and advanced into the bladder under direct visualization.  Prior cystoscopy had been done which noted normal anatomic landmarks.  These were again verified during cystoscopy today.  The cystoscope was brought back to the bladder neck and the needle was advanced through the needle guide at the 1 o'clock position.  Once it was visualized and advanced it was rotated to the 5 o'clock position.  Bulkamid was then injected until blood was seen.  This was then repeated at the 1 o'clock position in the 7 o'clock position until coaptation was noted.   This concluded the case.  The patient's bladder was left with approximately 200 cc of sterile saline.  The patient emerged from anesthesia and was  transferred the PACU in stable condition.   Plan:  Plan for patient to void in PACU prior to discharge.

## 2021-10-16 NOTE — Interval H&P Note (Signed)
History and Physical Interval Note:  10/16/2021 10:09 AM  Mackenzie Gonzalez  has presented today for surgery, with the diagnosis of STRESS URINARY INCONTINENCE.  The various methods of treatment have been discussed with the patient and family. After consideration of risks, benefits and other options for treatment, the patient has consented to  Procedure(s) with comments: [place] (N/A) - 71 MINS as a surgical intervention.  The patient's history has been reviewed, patient examined, no change in status, stable for surgery.  I have reviewed the patient's chart and labs.  Questions were answered to the patient's satisfaction.     Lorenso Quirino D Yolinda Duerr

## 2021-10-16 NOTE — Discharge Instructions (Addendum)
Cystoscopy with Bulkamid patient instructions  Following a cystoscopy, a catheter (a flexible rubber tube) is sometimes left in place to empty the bladder. This may cause some discomfort or a feeling that you need to urinate. Your doctor determines the period of time that the catheter will be left in place. You may have bloody urine for two to three days (Call your doctor if the amount of bleeding increases or does not subside).  You may pass blood clots in your urine, especially if you had a biopsy. It is not unusual to pass small blood clots and have some bloody urine a couple of weeks after your cystoscopy. Again, call your doctor if the bleeding does not subside. You may have: Dysuria (painful urination) Frequency (urinating often) Urgency (strong desire to urinate)  These symptoms are common especially if medicine is instilled into the bladder or a ureteral stent is placed. Avoiding alcohol and caffeine, such as coffee, tea, and chocolate, may help relieve these symptoms. Drink plenty of water, unless otherwise instructed. Your doctor may also prescribe an antibiotic or other medicine to reduce these symptoms.  Cystoscopy results are available soon after the procedure; biopsy results usually take two to four days. Your doctor will discuss the results of your exam with you. Before you go home, you will be given specific instructions for follow-up care. Special Instructions:   If you are going home with a catheter in place do not take a tub bath until removed by your doctor.   You may resume your normal activities.   Do not drive or operate machinery if you are taking narcotic pain medicine.   Be sure to keep all follow-up appointments with your doctor.   Call Your Doctor If: The catheter is not draining You have severe pain You are unable to urinate You have a fever over 101 You have severe bleeding   Post Anesthesia Home Care Instructions  Activity: Get plenty of rest for the  remainder of the day. A responsible individual must stay with you for 24 hours following the procedure.  For the next 24 hours, DO NOT: -Drive a car -Operate machinery -Drink alcoholic beverages -Take any medication unless instructed by your physician -Make any legal decisions or sign important papers.  Meals: Start with liquid foods such as gelatin or soup. Progress to regular foods as tolerated. Avoid greasy, spicy, heavy foods. If nausea and/or vomiting occur, drink only clear liquids until the nausea and/or vomiting subsides. Call your physician if vomiting continues.  Special Instructions/Symptoms: Your throat may feel dry or sore from the anesthesia or the breathing tube placed in your throat during surgery. If this causes discomfort, gargle with warm salt water. The discomfort should disappear within 24 hours.             

## 2021-10-16 NOTE — Transfer of Care (Signed)
Immediate Anesthesia Transfer of Care Note  Patient: Mackenzie Gonzalez  Procedure(s) Performed: CYSTOSCOPY WITH BULKAMID (Urethra)  Patient Location: PACU  Anesthesia Type:General  Level of Consciousness: drowsy  Airway & Oxygen Therapy: Patient Spontanous Breathing  Post-op Assessment: Report given to RN and Post -op Vital signs reviewed and stable  Post vital signs: Reviewed and stable  Last Vitals:  Vitals Value Taken Time  BP    Temp    Pulse 72 10/16/21 1104  Resp 16 10/16/21 1104  SpO2 96 % 10/16/21 1104  Vitals shown include unvalidated device data.  Last Pain:  Vitals:   10/16/21 0738  TempSrc: Oral  PainSc: 0-No pain      Patients Stated Pain Goal: 4 (16/10/96 0454)  Complications: No notable events documented.

## 2021-10-17 ENCOUNTER — Encounter (HOSPITAL_BASED_OUTPATIENT_CLINIC_OR_DEPARTMENT_OTHER): Payer: Self-pay | Admitting: Urology

## 2021-10-19 ENCOUNTER — Other Ambulatory Visit (HOSPITAL_COMMUNITY): Payer: Self-pay

## 2021-10-24 ENCOUNTER — Other Ambulatory Visit (HOSPITAL_COMMUNITY): Payer: Self-pay

## 2021-12-04 ENCOUNTER — Ambulatory Visit: Payer: Medicaid Other | Admitting: Internal Medicine

## 2021-12-06 ENCOUNTER — Encounter (HOSPITAL_COMMUNITY): Payer: Self-pay | Admitting: Psychiatry

## 2021-12-06 ENCOUNTER — Telehealth (INDEPENDENT_AMBULATORY_CARE_PROVIDER_SITE_OTHER): Payer: Medicaid Other | Admitting: Psychiatry

## 2021-12-06 DIAGNOSIS — F411 Generalized anxiety disorder: Secondary | ICD-10-CM | POA: Diagnosis not present

## 2021-12-06 DIAGNOSIS — F250 Schizoaffective disorder, bipolar type: Secondary | ICD-10-CM

## 2021-12-06 MED ORDER — QUETIAPINE FUMARATE 400 MG PO TABS: 400.0000 mg | ORAL_TABLET | Freq: Every day | ORAL | 3 refills | Status: DC

## 2021-12-06 MED ORDER — PROPRANOLOL HCL 10 MG PO TABS: 10.0000 mg | ORAL_TABLET | Freq: Three times a day (TID) | ORAL | 3 refills | Status: DC

## 2021-12-06 MED ORDER — GABAPENTIN 100 MG PO CAPS: 100.0000 mg | ORAL_CAPSULE | Freq: Three times a day (TID) | ORAL | 3 refills | Status: DC
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
## 2021-12-10 ENCOUNTER — Ambulatory Visit: Payer: Medicaid Other | Admitting: Student

## 2021-12-10 ENCOUNTER — Encounter: Payer: Self-pay | Admitting: Student

## 2021-12-10 VITALS — BP 136/88 | HR 69 | Temp 97.7°F | Ht 65.0 in | Wt 196.7 lb

## 2021-12-10 DIAGNOSIS — N644 Mastodynia: Secondary | ICD-10-CM | POA: Diagnosis not present

## 2021-12-10 DIAGNOSIS — K2100 Gastro-esophageal reflux disease with esophagitis, without bleeding: Secondary | ICD-10-CM

## 2021-12-10 DIAGNOSIS — F411 Generalized anxiety disorder: Secondary | ICD-10-CM | POA: Diagnosis not present

## 2021-12-10 DIAGNOSIS — F250 Schizoaffective disorder, bipolar type: Secondary | ICD-10-CM

## 2021-12-10 DIAGNOSIS — J410 Simple chronic bronchitis: Secondary | ICD-10-CM | POA: Diagnosis not present

## 2021-12-10 DIAGNOSIS — B20 Human immunodeficiency virus [HIV] disease: Secondary | ICD-10-CM | POA: Diagnosis not present

## 2021-12-10 DIAGNOSIS — L84 Corns and callosities: Secondary | ICD-10-CM | POA: Diagnosis not present

## 2021-12-10 DIAGNOSIS — K208 Other esophagitis without bleeding: Secondary | ICD-10-CM

## 2021-12-10 DIAGNOSIS — E785 Hyperlipidemia, unspecified: Secondary | ICD-10-CM | POA: Diagnosis not present

## 2021-12-10 DIAGNOSIS — Z0000 Encounter for general adult medical examination without abnormal findings: Secondary | ICD-10-CM

## 2021-12-10 DIAGNOSIS — Z1231 Encounter for screening mammogram for malignant neoplasm of breast: Secondary | ICD-10-CM

## 2021-12-10 DIAGNOSIS — K648 Other hemorrhoids: Secondary | ICD-10-CM | POA: Insufficient documentation

## 2021-12-10 DIAGNOSIS — I6350 Cerebral infarction due to unspecified occlusion or stenosis of unspecified cerebral artery: Secondary | ICD-10-CM

## 2021-12-10 DIAGNOSIS — K219 Gastro-esophageal reflux disease without esophagitis: Secondary | ICD-10-CM

## 2021-12-10 MED ORDER — BIKTARVY 50-200-25 MG PO TABS: 1.0000 | ORAL_TABLET | Freq: Every day | ORAL | 3 refills | Status: DC

## 2021-12-10 MED ORDER — PANTOPRAZOLE SODIUM 40 MG PO TBEC: DELAYED_RELEASE_TABLET | ORAL | 3 refills | Status: DC

## 2021-12-10 MED ORDER — QUETIAPINE FUMARATE 400 MG PO TABS: 400.0000 mg | ORAL_TABLET | Freq: Every day | ORAL | 3 refills | Status: DC

## 2021-12-10 MED ORDER — ALBUTEROL SULFATE HFA 108 (90 BASE) MCG/ACT IN AERS: 2.0000 | INHALATION_SPRAY | Freq: Four times a day (QID) | RESPIRATORY_TRACT | 6 refills | Status: DC | PRN

## 2021-12-10 MED ORDER — ATORVASTATIN CALCIUM 80 MG PO TABS: 80.0000 mg | ORAL_TABLET | Freq: Every day | ORAL | 0 refills | Status: DC

## 2021-12-10 MED ORDER — GABAPENTIN 100 MG PO CAPS: 100.0000 mg | ORAL_CAPSULE | Freq: Three times a day (TID) | ORAL | 3 refills | Status: DC

## 2021-12-10 MED ORDER — PROPRANOLOL HCL 10 MG PO TABS: 10.0000 mg | ORAL_TABLET | Freq: Three times a day (TID) | ORAL | 3 refills | Status: DC

## 2021-12-10 NOTE — Assessment & Plan Note (Signed)
Patient states that she had a hard bulge on the medial aspect of her R big toe, unclear when she first noticed it. She thinks it may have been from prior overused foot wear that was tight around her feet. About 2-3 weeks ago, she used a blade to scrape off the dry, rough skin around the area and has since noted persistence of dry skin in the area. She has not noted any significant bleeding, worsening wounds, or discharge from the area.   On exam, the medial aspect of her right big toe does have rough texture with dry, mildly cracked skin. Her ROM is intact and she does not have any bleeding, open wounds, or discharge.   We discussed that she most likely had a callus on big toe. I advised her to use skin moisturizer to allow for dry, cracked skin to heal. She has since obtained new shoes which are more comfortable for her and are more loose fitting with good support.

## 2021-12-10 NOTE — Assessment & Plan Note (Signed)
She has remained adherent with aspirin but has not taken lipitor in the past several weeks (see dyslipidemia problem). I have refilled her lipitor and advised for her maintain strict adherence to minimize risk for recurrence.  Plan: -continue ASA -refilled lipitor
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
## 2021-12-10 NOTE — Assessment & Plan Note (Addendum)
States that there was some confusion around her biktarvy and she did not obtain it from [facility]. She would like for all of her medications to be transferred to a new pharmacy for convenience so I have refilled them to her preferred pharmacy, including biktarvy. Recent labs from visit with [facility] with undetectable HIV RNA quant and CD4 count of 706.

## 2021-12-10 NOTE — Progress Notes (Signed)
Internal Medicine Clinic Attending  Case discussed with Dr. Jinwala  At the time of the visit.  We reviewed the resident's history and exam and pertinent patient test results.  I agree with the assessment, diagnosis, and plan of care documented in the resident's note.  

## 2021-12-10 NOTE — Assessment & Plan Note (Signed)
Patient complaining of burning sensation on L breast that began last week and remained for 2-3 days. Burning sensation was in the left upper outer quadrant and about 4/10 in severity. She denies any acute overlying skin changes, swelling, masses/nodules, nipple inversion or discharge. Denies any overlying rash or lesions. Burning sensation was reproducible with palpation in the area. She made an appointment to be seen when experiencing this burning sensation, but it spontaneously resolved about 2-3 days after onset and she is no longer experiencing this sensation. Breast exam conducted today with chaperone present was without any notable findings. Did not palpate any masses or note any swelling, peau d'orange skin changes, nipple inversion or discharge, or TTP. No surrounding lymphadenopathy noted.  She is due for a screening mammogram, which she agreed to get to make sure no underlying pathology. Have advised her to conduct regular self-breast exams and to check for any recurrence of symptoms.   Plan: -screening mammogram

## 2021-12-10 NOTE — Assessment & Plan Note (Signed)
She notes that she has not taken her lipitor in the past several weeks as she was unsure if she needed to take it. We discussed that this will likely be a life-long medicine after having experienced her stroke to ensure that we minimize any risk for recurrence. Refilled lipitor today.  -continue lipitor -check lipid panel at next visit

## 2021-12-10 NOTE — Assessment & Plan Note (Signed)
Patient refused flu shot today.

## 2021-12-10 NOTE — Assessment & Plan Note (Signed)
Patient was found to have bulky, non-bleeding internal hemorrhoids on recent colonoscopy (02/2021). At that time, GI recommended preparation H and suppositories along with increased fiber diet to help with symptoms. She states that she has been adherent with ointment and suppositories but about a month ago she began experiencing more discomfort from her hemorrhoids. She has noted more itching and bleeding with wiping. She has also noted several occasions where hemorrhoids are bulging outwards. She has not been able to perform sitz baths due to physical limitations after her stroke and has tried to maintain a high fiber diet, although not always. She was told by GI that she may be a candidate for endoscopic band ligation should her symptoms become more problematic and she wishes to obtain a repeat evaluation by GI for this reason.   Will place referral to [place] (performed patient's EGD and colonoscopy in 02/2021) for further discussions in regards to management of her internal hemorrhoids.  Plan: -referral to GI -continue preparation H, suppositories, and high fiber diet in the meantime

## 2021-12-10 NOTE — Addendum Note (Signed)
Addended by: Jodean Lima on: 12/10/2021 03:08 PM   Modules accepted: Level of Service

## 2021-12-10 NOTE — Patient Instructions (Signed)
Mackenzie Gonzalez,  It was a pleasure seeing you in the clinic today.   For your breast discomfort, please keep an eye for further breast discomfort. I have ordered a mammogram to screen for breast cancer as we discussed. They will give you a call to schedule this appointment. I have placed a referral to GI (stomach doctors) to help with your hemorrhoids. As we discussed, please use moisturizer for your skin on your toe. Please do not use any sharp objects to scrape the skin.  [facility] [place] as [facility]. Please come back to see [place] in 3 months.  Please call our clinic at [phone] if you have any questions or concerns. The best time to call is Monday-Friday from 9am-4pm, but there is someone available 24/7 at the same number. If you need medication refills, please notify your pharmacy one week in advance and they will send [place] a request.   Thank you for letting us take part in your care. We look forward to seeing you next time!

## 2021-12-11 ENCOUNTER — Encounter: Payer: Self-pay | Admitting: Infectious Disease

## 2021-12-11 ENCOUNTER — Ambulatory Visit (INDEPENDENT_AMBULATORY_CARE_PROVIDER_SITE_OTHER): Payer: Medicaid Other | Admitting: Infectious Disease

## 2021-12-11 ENCOUNTER — Other Ambulatory Visit (HOSPITAL_COMMUNITY): Payer: Self-pay

## 2021-12-11 ENCOUNTER — Ambulatory Visit: Payer: Medicaid Other | Admitting: Internal Medicine

## 2021-12-11 ENCOUNTER — Other Ambulatory Visit: Payer: Self-pay

## 2021-12-11 VITALS — BP 126/85 | HR 60 | Temp 98.1°F | Ht 65.0 in | Wt 198.0 lb

## 2021-12-11 DIAGNOSIS — E785 Hyperlipidemia, unspecified: Secondary | ICD-10-CM | POA: Diagnosis not present

## 2021-12-11 DIAGNOSIS — I69354 Hemiplegia and hemiparesis following cerebral infarction affecting left non-dominant side: Secondary | ICD-10-CM | POA: Diagnosis not present

## 2021-12-11 DIAGNOSIS — B20 Human immunodeficiency virus [HIV] disease: Secondary | ICD-10-CM | POA: Diagnosis present

## 2021-12-11 DIAGNOSIS — F17200 Nicotine dependence, unspecified, uncomplicated: Secondary | ICD-10-CM

## 2021-12-11 HISTORY — DX: Hyperlipidemia, unspecified: E78.5

## 2021-12-11 MED ORDER — BIKTARVY 50-200-25 MG PO TABS
1.0000 | ORAL_TABLET | Freq: Every day | ORAL | 3 refills | Status: DC
  Filled 2021-12-11: qty 90, 90d supply, fill #0

## 2021-12-11 MED ORDER — BIKTARVY 50-200-25 MG PO TABS: 1.0000 | ORAL_TABLET | Freq: Every day | ORAL | 3 refills | Status: DC

## 2021-12-11 MED ORDER — ATORVASTATIN CALCIUM 80 MG PO TABS
80.0000 mg | ORAL_TABLET | Freq: Every day | ORAL | 0 refills | Status: DC
  Filled 2021-12-11: qty 90, 90d supply, fill #0

## 2021-12-11 MED ORDER — ATORVASTATIN CALCIUM 80 MG PO TABS: 80.0000 mg | ORAL_TABLET | Freq: Every day | ORAL | 0 refills | Status: DC
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
## 2021-12-12 LAB — T-HELPER CELLS (CD4) COUNT (NOT AT ARMC)
CD4 % Helper T Cell: 42 % (ref 33–65)
CD4 T Cell Abs: 815 /uL (ref 400–1790)

## 2021-12-13 ENCOUNTER — Other Ambulatory Visit (HOSPITAL_COMMUNITY): Payer: Self-pay

## 2021-12-14 LAB — COMPLETE METABOLIC PANEL WITH GFR
AG Ratio: 1.6 (calc) (ref 1.0–2.5)
ALT: 13 U/L (ref 6–29)
AST: 13 U/L (ref 10–35)
Albumin: 4.3 g/dL (ref 3.6–5.1)
Alkaline phosphatase (APISO): 80 U/L (ref 37–153)
BUN: 14 mg/dL (ref 7–25)
CO2: 29 mmol/L (ref 20–32)
Calcium: 9.5 mg/dL (ref 8.6–10.4)
Chloride: 108 mmol/L (ref 98–110)
Creat: 0.88 mg/dL (ref 0.50–1.05)
Globulin: 2.7 g/dL (calc) (ref 1.9–3.7)
Glucose, Bld: 92 mg/dL (ref 65–99)
Potassium: 5.1 mmol/L (ref 3.5–5.3)
Sodium: 141 mmol/L (ref 135–146)
Total Bilirubin: 0.4 mg/dL (ref 0.2–1.2)
Total Protein: 7 g/dL (ref 6.1–8.1)
eGFR: 75 mL/min/{1.73_m2} (ref >=60)

## 2021-12-14 LAB — HIV-1 RNA QUANT-NO REFLEX-BLD
HIV 1 RNA Quant: 128 Copies/mL — ABNORMAL HIGH
HIV-1 RNA Quant, Log: 2.11 Log cps/mL — ABNORMAL HIGH

## 2021-12-14 LAB — LIPID PANEL
Cholesterol: 236 mg/dL — ABNORMAL HIGH (ref <200)
HDL: 43 mg/dL — ABNORMAL LOW (ref >=50)
LDL Cholesterol (Calc): 161 mg/dL (calc) — ABNORMAL HIGH
Non-HDL Cholesterol (Calc): 193 mg/dL (calc) — ABNORMAL HIGH (ref <130)
Total CHOL/HDL Ratio: 5.5 (calc) — ABNORMAL HIGH (ref <5.0)
Triglycerides: 170 mg/dL — ABNORMAL HIGH (ref <150)

## 2021-12-14 LAB — CBC WITH DIFFERENTIAL/PLATELET
Absolute Monocytes: 495 cells/uL (ref 200–950)
Basophils Absolute: 31 cells/uL (ref 0–200)
Basophils Relative: 0.6 %
Eosinophils Absolute: 122 cells/uL (ref 15–500)
Eosinophils Relative: 2.4 %
HCT: 39 % (ref 35.0–45.0)
Hemoglobin: 13 g/dL (ref 11.7–15.5)
Lymphs Abs: 2203 cells/uL (ref 850–3900)
MCH: 32.3 pg (ref 27.0–33.0)
MCHC: 33.3 g/dL (ref 32.0–36.0)
MCV: 97 fL (ref 80.0–100.0)
MPV: 11.7 fL (ref 7.5–12.5)
Monocytes Relative: 9.7 %
Neutro Abs: 2249 cells/uL (ref 1500–7800)
Neutrophils Relative %: 44.1 %
Platelets: 231 10*3/uL (ref 140–400)
RBC: 4.02 10*6/uL (ref 3.80–5.10)
RDW: 12.7 % (ref 11.0–15.0)
Total Lymphocyte: 43.2 %
WBC: 5.1 10*3/uL (ref 3.8–10.8)

## 2021-12-14 LAB — RPR: RPR Ser Ql: NONREACTIVE

## 2022-01-01 ENCOUNTER — Ambulatory Visit: Payer: Medicaid Other | Admitting: Nurse Practitioner

## 2022-01-01 ENCOUNTER — Encounter: Payer: Self-pay | Admitting: Nurse Practitioner

## 2022-01-01 VITALS — BP 146/92 | HR 53 | Ht 65.0 in | Wt 195.0 lb

## 2022-01-01 DIAGNOSIS — K625 Hemorrhage of anus and rectum: Secondary | ICD-10-CM | POA: Diagnosis not present

## 2022-01-01 DIAGNOSIS — K648 Other hemorrhoids: Secondary | ICD-10-CM | POA: Diagnosis not present

## 2022-01-01 DIAGNOSIS — K644 Residual hemorrhoidal skin tags: Secondary | ICD-10-CM | POA: Diagnosis not present

## 2022-01-01 NOTE — Patient Instructions (Addendum)
Continue Metamucil daily as tolerated   Take Miralax 1 capful mixed in 8 ounces of water at bed time for constipation as tolerated  Apply a small amount of Desitin inside the anal opening and to the external anal area tid as needed for anal or hemorrhoidal irritation/bleeding  Sitz bath, soak bottom in warm water for 10 minutes twice daily as needed   Contact our office if your constipation worsens. Plan on restarting [place] if constipation worsens.   Contact our office if your hemorrhoidal pain and bleeding worsens. Consider internal hemorrhoid banding if internal hemorrhoids problematic. Consider referral to colorectal surgery if external hemorrhoids symptoms worsen.  Thank you for trusting me with your gastrointestinal care!   Carl Best, CRNP
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
## 2022-01-25 ENCOUNTER — Ambulatory Visit
Admission: RE | Admit: 2022-01-25 | Discharge: 2022-01-25 | Disposition: A | Discharge status: Home / Self Care | Payer: Medicaid Other | Source: Ambulatory Visit | Attending: Internal Medicine | Admitting: Internal Medicine

## 2022-01-25 DIAGNOSIS — Z1231 Encounter for screening mammogram for malignant neoplasm of breast: Secondary | ICD-10-CM

## 2022-01-31 NOTE — Progress Notes (Signed)
Reviewed and agree with management plans. Would encourage consultation with surgery to make informed decisions about hemorrhoid treatment options.   Tanasha Menees L. Tarri Glenn, MD, MPH

## 2022-03-07 ENCOUNTER — Encounter (HOSPITAL_COMMUNITY): Payer: Self-pay | Admitting: Student in an Organized Health Care Education/Training Program

## 2022-03-07 ENCOUNTER — Telehealth (INDEPENDENT_AMBULATORY_CARE_PROVIDER_SITE_OTHER)
Payer: No Typology Code available for payment source | Admitting: Student in an Organized Health Care Education/Training Program

## 2022-03-07 DIAGNOSIS — F250 Schizoaffective disorder, bipolar type: Secondary | ICD-10-CM | POA: Diagnosis not present

## 2022-03-07 DIAGNOSIS — F411 Generalized anxiety disorder: Secondary | ICD-10-CM

## 2022-03-07 MED ORDER — QUETIAPINE FUMARATE 400 MG PO TABS: 400.0000 mg | ORAL_TABLET | Freq: Every day | ORAL | 3 refills | Status: DC

## 2022-03-07 MED ORDER — GABAPENTIN 100 MG PO CAPS: 100.0000 mg | ORAL_CAPSULE | Freq: Three times a day (TID) | ORAL | 3 refills | Status: DC

## 2022-03-07 MED ORDER — DULOXETINE HCL 30 MG PO CPEP: 30.0000 mg | ORAL_CAPSULE | Freq: Every day | ORAL | 2 refills | Status: DC
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
## 2022-04-30 ENCOUNTER — Encounter: Payer: Self-pay | Admitting: Internal Medicine

## 2022-04-30 ENCOUNTER — Ambulatory Visit: Payer: Medicaid Other | Admitting: Internal Medicine

## 2022-04-30 VITALS — BP 139/89 | HR 85 | Temp 98.2°F | Wt 193.7 lb

## 2022-04-30 DIAGNOSIS — R1032 Left lower quadrant pain: Secondary | ICD-10-CM

## 2022-04-30 MED ORDER — ATORVASTATIN CALCIUM 80 MG PO TABS: 80.0000 mg | ORAL_TABLET | Freq: Every day | ORAL | 3 refills | Status: DC

## 2022-04-30 MED ORDER — PANTOPRAZOLE SODIUM 40 MG PO TBEC: DELAYED_RELEASE_TABLET | ORAL | 3 refills | Status: DC

## 2022-04-30 NOTE — Assessment & Plan Note (Addendum)
The patient presents to the [facility] to discuss left lower quadrant abdominal pain that has been intermittent over the last 6 months.  She states that she feels a twisting and cramping sensation in her left abdomen that is quite painful.  The patient is ultimately unsure when this first started what first triggered it, but notes that these episodes happen daily now.   In addition to her abdominal pain, she also describes having some abdominal bloating, and poor appetite.  She states that she feels nauseated after she eats food and has early satiety, but denies any vomiting.  She also denies any fevers or chills.  Of note, she has a history of grade A esophagitis seen on EGD in February 2023, and also has a history of 3 tubular adenomatous polyps that were removed from the colon at this time as well. She has no history of kidney stones.   On exam, the patient's abdomen is soft and nondistended.  She has mild tenderness to palpation in the left lower quadrant, but no masses or hernias are appreciated.  Her bowel sounds are also normal.  A/P: Ultimately I am unsure if her pain is GI in nature, or possibly related to her reproductive system (ovarian torsion vs cyst?).  Will obtain CT abdomen/pelvis to evaluate this further.  Will also get CMP and CBC, although I doubt diverticulitis is the culprit, given how long this has been ongoing.  Additionally, I advised the patient to follow-up with her gastroenterologist.

## 2022-04-30 NOTE — Patient Instructions (Signed)
Thank you, [name] for allowing [place] to provide your care today. Today we discussed:  Abdominal pain: We are doing some blood work today We are also getting a CT scan of your abdomen/pelvis to see if we can figure out what is causing your abdominal pain I would recommend you call your gastroenterologist to be seen, as well  I have ordered the following labs for you:   Lab Orders         CBC with Diff         CMP14 + Anion Gap      Tests ordered today:  CT abdomen/pelvis  Referrals ordered today:   Referral Orders  No referral(s) requested today     I have ordered the following medication/changed the following medications:   Stop the following medications: Medications Discontinued During This Encounter  Medication Reason   pantoprazole (PROTONIX) 40 MG tablet Reorder   atorvastatin (LIPITOR) 80 MG tablet Reorder     Start the following medications: Meds ordered this encounter  Medications   atorvastatin (LIPITOR) 80 MG tablet    Sig: Take 1 tablet by mouth daily.    Dispense:  90 tablet    Refill:  3   pantoprazole (PROTONIX) 40 MG tablet    Sig: Take 1 tablet by mouth twice a day.    Dispense:  60 tablet    Refill:  3     Follow up:  if symptoms worsen or fail to improve     Should you have any questions or concerns please call the internal medicine clinic at [phone].     Buddy Duty, D.O. [place]

## 2022-05-01 LAB — CMP14 + ANION GAP
ALT: 24 IU/L (ref 0–32)
AST: 11 IU/L (ref 0–40)
Albumin/Globulin Ratio: 1.8 (ref 1.2–2.2)
Albumin: 4.4 g/dL (ref 3.8–4.9)
Alkaline Phosphatase: 95 IU/L (ref 44–121)
Anion Gap: 16 mmol/L (ref 10.0–18.0)
BUN/Creatinine Ratio: 16 (ref 12–28)
BUN: 14 mg/dL (ref 8–27)
Bilirubin Total: 0.4 mg/dL (ref 0.0–1.2)
CO2: 20 mmol/L (ref 20–29)
Calcium: 9.5 mg/dL (ref 8.7–10.3)
Chloride: 107 mmol/L — ABNORMAL HIGH (ref 96–106)
Creatinine, Ser: 0.9 mg/dL (ref 0.57–1.00)
Globulin, Total: 2.5 g/dL (ref 1.5–4.5)
Glucose: 98 mg/dL (ref 70–99)
Potassium: 4 mmol/L (ref 3.5–5.2)
Sodium: 143 mmol/L (ref 134–144)
Total Protein: 6.9 g/dL (ref 6.0–8.5)
eGFR: 73 mL/min/{1.73_m2} (ref >59)

## 2022-05-01 LAB — CBC WITH DIFFERENTIAL/PLATELET
Basophils Absolute: 0 10*3/uL (ref 0.0–0.2)
Basos: 1 %
EOS (ABSOLUTE): 0.1 10*3/uL (ref 0.0–0.4)
Eos: 2 %
Hematocrit: 38.8 % (ref 34.0–46.6)
Hemoglobin: 12.9 g/dL (ref 11.1–15.9)
Immature Grans (Abs): 0 10*3/uL (ref 0.0–0.1)
Immature Granulocytes: 0 %
Lymphocytes Absolute: 2.1 10*3/uL (ref 0.7–3.1)
Lymphs: 35 %
MCH: 31.5 pg (ref 26.6–33.0)
MCHC: 33.2 g/dL (ref 31.5–35.7)
MCV: 95 fL (ref 79–97)
Monocytes Absolute: 0.5 10*3/uL (ref 0.1–0.9)
Monocytes: 8 %
Neutrophils Absolute: 3.2 10*3/uL (ref 1.4–7.0)
Neutrophils: 54 %
Platelets: 220 10*3/uL (ref 150–450)
RBC: 4.09 x10E6/uL (ref 3.77–5.28)
RDW: 13 % (ref 11.7–15.4)
WBC: 5.9 10*3/uL (ref 3.4–10.8)

## 2022-05-01 NOTE — Progress Notes (Signed)
Internal Medicine Clinic Attending  Case discussed with Dr. Raymondo Band  At the time of the visit.  We reviewed the resident's history and exam and pertinent patient test results.  I agree with the assessment, diagnosis, and plan of care documented in the resident's note. If CT is unrevealing, we could consider gyn [place] for further evaluation. Recommend f/u with GI for potential repeat EGD given symptoms of early satiety and nausea.

## 2022-05-01 NOTE — Progress Notes (Signed)
CMP CBC unremarkable

## 2022-05-07 ENCOUNTER — Ambulatory Visit (INDEPENDENT_AMBULATORY_CARE_PROVIDER_SITE_OTHER): Payer: Medicaid Other | Admitting: Student

## 2022-05-07 DIAGNOSIS — N3946 Mixed incontinence: Secondary | ICD-10-CM | POA: Diagnosis not present
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
## 2022-05-07 NOTE — Assessment & Plan Note (Addendum)
Visit conducted via telephone call. Ms. Hermans states that she has been dealing with urinary incontinence for several years. She has a history of 1 pregnancy delivered via C-section, no history of vaginal deliveries. States that she tried kegel exercises in the past but was still noting continued incontinence. Her incontinence mainly stems from coughing, laughing, sneezing which is consistent with stress incontinence but also has symptoms of urge incontinence as she feels abrupt urges to need to go urinate and is often unable to make it to the bathroom in time. She has tried timed voiding (going to the bathroom every couple of hours) but was still experiencing some episodes of incontinence with this. She has been seen by [facility] in the past but unable to access charting and thus unable to see what workup was done for her by them. She was also tried on myrbetriq but she was unable to tolerate this due to adverse effects. She has found good success with urinary incontinence supplies, primarily in the form of adult diapers. States that it helps her perform her daily activities and it is comfortable for her to know that she will not become incontinent on to her clothing.   Will renew her incontinence supplies given that they are allowing her to be more functional in regards to ADLs and iADLs. She uses [facility] for her supplies.

## 2022-05-10 NOTE — Progress Notes (Signed)
Internal Medicine Clinic Attending  Case discussed with Dr. Jinwala  At the time of the visit.  We reviewed the resident's history and exam and pertinent patient test results.  I agree with the assessment, diagnosis, and plan of care documented in the resident's note.  

## 2022-05-17 ENCOUNTER — Ambulatory Visit (HOSPITAL_COMMUNITY)
Admission: RE | Admit: 2022-05-17 | Discharge: 2022-05-17 | Disposition: A | Discharge status: Home / Self Care | Payer: Medicaid Other | Source: Ambulatory Visit | Attending: Internal Medicine | Admitting: Internal Medicine

## 2022-05-17 DIAGNOSIS — R1032 Left lower quadrant pain: Secondary | ICD-10-CM

## 2022-05-17 MED ORDER — IOHEXOL 350 MG/ML SOLN
75.0000 mL | Freq: Once | INTRAVENOUS | Status: AC | PRN
  Administered 2022-05-17: 75 mL via INTRAVENOUS

## 2022-05-24 ENCOUNTER — Telehealth (INDEPENDENT_AMBULATORY_CARE_PROVIDER_SITE_OTHER)
Payer: No Typology Code available for payment source | Admitting: Student in an Organized Health Care Education/Training Program

## 2022-05-24 ENCOUNTER — Encounter (HOSPITAL_COMMUNITY): Payer: Self-pay | Admitting: Student in an Organized Health Care Education/Training Program

## 2022-05-24 DIAGNOSIS — F411 Generalized anxiety disorder: Secondary | ICD-10-CM

## 2022-05-24 DIAGNOSIS — F250 Schizoaffective disorder, bipolar type: Secondary | ICD-10-CM

## 2022-05-24 MED ORDER — DULOXETINE HCL 30 MG PO CPEP: 30.0000 mg | ORAL_CAPSULE | Freq: Every day | ORAL | 2 refills | Status: DC

## 2022-05-24 MED ORDER — QUETIAPINE FUMARATE 400 MG PO TABS: 400.0000 mg | ORAL_TABLET | Freq: Every day | ORAL | 3 refills | Status: DC

## 2022-05-24 MED ORDER — GABAPENTIN 100 MG PO CAPS: 100.0000 mg | ORAL_CAPSULE | Freq: Three times a day (TID) | ORAL | 3 refills | Status: DC
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
## 2022-07-19 ENCOUNTER — Telehealth (INDEPENDENT_AMBULATORY_CARE_PROVIDER_SITE_OTHER)
Payer: No Typology Code available for payment source | Admitting: Student in an Organized Health Care Education/Training Program

## 2022-07-19 ENCOUNTER — Encounter (HOSPITAL_COMMUNITY): Payer: Self-pay | Admitting: Student in an Organized Health Care Education/Training Program

## 2022-07-19 DIAGNOSIS — F411 Generalized anxiety disorder: Secondary | ICD-10-CM | POA: Diagnosis not present

## 2022-07-19 DIAGNOSIS — F250 Schizoaffective disorder, bipolar type: Secondary | ICD-10-CM | POA: Diagnosis not present

## 2022-07-19 DIAGNOSIS — F4310 Post-traumatic stress disorder, unspecified: Secondary | ICD-10-CM | POA: Diagnosis not present

## 2022-07-19 MED ORDER — QUETIAPINE FUMARATE 400 MG PO TABS: 400.0000 mg | ORAL_TABLET | Freq: Every day | ORAL | 3 refills | Status: DC

## 2022-07-19 MED ORDER — DULOXETINE HCL 60 MG PO CPEP: 60.0000 mg | ORAL_CAPSULE | Freq: Every day | ORAL | 2 refills | Status: DC

## 2022-07-19 MED ORDER — GABAPENTIN 300 MG PO CAPS: 300.0000 mg | ORAL_CAPSULE | Freq: Every day | ORAL | 3 refills | Status: DC
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
## 2022-08-13 ENCOUNTER — Telehealth: Payer: Self-pay

## 2022-08-13 NOTE — Telephone Encounter (Signed)
Prior Authorization for patient (Albuterol Sulfate HFA) came through on cover my meds was submitted with last office notes awaiting approval or denial.  QIO:[id number]

## 2022-08-14 NOTE — Telephone Encounter (Signed)
Decision:Approved Doyne Keel (Key: [id number]) PA Case ID #: [id number] Rx #: [id number] Need Help? Call us at [phone] Outcome Approved today Your request has been approved Authorization Expiration Date: 08/13/2023 Drug Albuterol Sulfate HFA 108 (90 Base)MCG/ACT aerosol ePA cloud logo Form [employer] ePA Form (2017 NCPDP) Original Claim Info 75 Prior Authorization Required

## 2022-08-28 ENCOUNTER — Ambulatory Visit: Payer: Medicaid Other | Admitting: Internal Medicine

## 2022-08-28 ENCOUNTER — Ambulatory Visit: Payer: MEDICAID | Admitting: Infectious Diseases

## 2022-08-29 ENCOUNTER — Telehealth (HOSPITAL_COMMUNITY): Payer: Self-pay | Admitting: *Deleted

## 2022-08-29 ENCOUNTER — Ambulatory Visit (HOSPITAL_COMMUNITY): Payer: No Typology Code available for payment source | Admitting: Psychiatry

## 2022-08-29 NOTE — Telephone Encounter (Signed)
Prior authorization for Quetiapine submitted online with cover my meds. Approved with #[id number]. Notified pharmacy.

## 2022-09-16 ENCOUNTER — Encounter: Payer: Self-pay | Admitting: Infectious Diseases

## 2022-09-16 ENCOUNTER — Other Ambulatory Visit: Payer: Self-pay

## 2022-09-16 ENCOUNTER — Ambulatory Visit: Payer: MEDICAID | Admitting: Infectious Diseases

## 2022-09-16 VITALS — BP 135/87 | HR 89 | Temp 97.6°F | Resp 16 | Wt 184.0 lb

## 2022-09-16 DIAGNOSIS — B001 Herpesviral vesicular dermatitis: Secondary | ICD-10-CM | POA: Diagnosis not present

## 2022-09-16 DIAGNOSIS — B20 Human immunodeficiency virus [HIV] disease: Secondary | ICD-10-CM

## 2022-09-16 DIAGNOSIS — Z8619 Personal history of other infectious and parasitic diseases: Secondary | ICD-10-CM

## 2022-09-16 DIAGNOSIS — Z79899 Other long term (current) drug therapy: Secondary | ICD-10-CM

## 2022-09-16 DIAGNOSIS — K7400 Hepatic fibrosis, unspecified: Secondary | ICD-10-CM | POA: Diagnosis not present

## 2022-09-16 MED ORDER — VALACYCLOVIR HCL 1 G PO TABS
1000.0000 mg | ORAL_TABLET | Freq: Two times a day (BID) | ORAL | 0 refills | Status: DC
Start: 2022-09-16 — End: 2023-06-24
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
## 2022-09-16 NOTE — Assessment & Plan Note (Signed)
Cured s/p DAAs. Last Fibrotest in 2023 F2. Given severe fibrosis pre-treatment will start [facility] surveillance.  Abdominal ultrasound ordered.

## 2022-09-16 NOTE — Assessment & Plan Note (Signed)
On demand valtrex rx provided and discussed. Sore on her right lower lip appears fresh.

## 2022-09-16 NOTE — Patient Instructions (Addendum)
If you'd like to Dr. Ninetta Lights is over at the internal medicine clinic  Will have [facility] reach out to you to help with food access for you.   Please continue the biktarvy once a day  I sent in refills for the valtrex for you to have at home - take 1 tablet twice a day for 5 days to help it go away faster. Also treats genital sores too. If you can catch it early sometimes you may only need 2 or 3 days of treatment.   Return in about 9 months (around 06/16/2023).

## 2022-09-16 NOTE — Assessment & Plan Note (Signed)
Very well controlled on once daily biktarvy - low level VL 128 copies in November but has been doing great with adherence - may be a blip. Will recheck VL and CD4 today. No concerns with access or adherence to medication. They are tolerating the medication well without side effects. No drug interactions identified. Pertinent lab tests ordered today.  Offered if she wold like to see Dr. Ninetta Lights back in IM clinic she is certainly able to.  No dental needs today.  No concern over anxious/depressed mood.  Sexual health and family planning discussed - politely declined as she is not sexually active. Pap smear due in 2026.  On statin for secondary prevention s/p CVA.   Return in about 9 months (around 06/16/2023).

## 2022-09-17 ENCOUNTER — Ambulatory Visit (HOSPITAL_BASED_OUTPATIENT_CLINIC_OR_DEPARTMENT_OTHER): Admission: RE | Admit: 2022-09-17 | Payer: MEDICAID | Source: Ambulatory Visit

## 2022-09-17 LAB — T-HELPER CELLS (CD4) COUNT (NOT AT ARMC)
CD4 % Helper T Cell: 44 % (ref 33–65)
CD4 T Cell Abs: 909 /uL (ref 400–1790)

## 2022-09-18 LAB — HIV-1 RNA QUANT-NO REFLEX-BLD
HIV 1 RNA Quant: 28 {copies}/mL — ABNORMAL HIGH
HIV-1 RNA Quant, Log: 1.45 {Log_copies}/mL — ABNORMAL HIGH

## 2022-09-18 LAB — LIPID PANEL
Cholesterol: 127 mg/dL (ref <200)
HDL: 43 mg/dL — ABNORMAL LOW (ref >=50)
LDL Cholesterol (Calc): 62 mg/dL
Non-HDL Cholesterol (Calc): 84 mg/dL (ref <130)
Total CHOL/HDL Ratio: 3 (calc) (ref <5.0)
Triglycerides: 138 mg/dL (ref <150)

## 2022-09-19 ENCOUNTER — Ambulatory Visit (HOSPITAL_COMMUNITY): Payer: MEDICAID | Admitting: Psychiatry

## 2022-09-19 ENCOUNTER — Encounter (HOSPITAL_COMMUNITY): Payer: Self-pay | Admitting: Psychiatry

## 2022-09-19 DIAGNOSIS — F323 Major depressive disorder, single episode, severe with psychotic features: Secondary | ICD-10-CM

## 2022-09-19 DIAGNOSIS — F603 Borderline personality disorder: Secondary | ICD-10-CM | POA: Diagnosis not present

## 2022-09-19 DIAGNOSIS — F1211 Cannabis abuse, in remission: Secondary | ICD-10-CM | POA: Diagnosis not present

## 2022-09-19 DIAGNOSIS — F4000 Agoraphobia, unspecified: Secondary | ICD-10-CM

## 2022-09-19 DIAGNOSIS — F5104 Psychophysiologic insomnia: Secondary | ICD-10-CM

## 2022-09-19 DIAGNOSIS — F4310 Post-traumatic stress disorder, unspecified: Secondary | ICD-10-CM

## 2022-09-19 DIAGNOSIS — Z720 Tobacco use: Secondary | ICD-10-CM

## 2022-09-19 DIAGNOSIS — F411 Generalized anxiety disorder: Secondary | ICD-10-CM | POA: Diagnosis not present

## 2022-09-19 DIAGNOSIS — G894 Chronic pain syndrome: Secondary | ICD-10-CM | POA: Insufficient documentation

## 2022-09-19 DIAGNOSIS — Z9151 Personal history of suicidal behavior: Secondary | ICD-10-CM | POA: Diagnosis not present

## 2022-09-19 DIAGNOSIS — F410 Panic disorder [episodic paroxysmal anxiety] without agoraphobia: Secondary | ICD-10-CM

## 2022-09-19 DIAGNOSIS — Z9152 Personal history of nonsuicidal self-harm: Secondary | ICD-10-CM

## 2022-09-19 DIAGNOSIS — Z79899 Other long term (current) drug therapy: Secondary | ICD-10-CM

## 2022-09-19 MED ORDER — QUETIAPINE FUMARATE 400 MG PO TABS
400.0000 mg | ORAL_TABLET | Freq: Every day | ORAL | 2 refills | Status: DC
Start: 2022-09-19 — End: 2022-11-19

## 2022-09-19 MED ORDER — GABAPENTIN 300 MG PO CAPS
300.0000 mg | ORAL_CAPSULE | Freq: Every day | ORAL | 2 refills | Status: DC
Start: 2022-09-19 — End: 2022-11-19

## 2022-09-19 MED ORDER — DULOXETINE HCL 60 MG PO CPEP
60.0000 mg | ORAL_CAPSULE | Freq: Every day | ORAL | 2 refills | Status: DC
Start: 2022-09-19 — End: 2022-10-08

## 2022-09-19 NOTE — Progress Notes (Signed)
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 office   I discussed the limitations of evaluation and management by telemedicine and the availability of in person appointments. The patient expressed understanding and agreed to proceed.  I discussed the assessment and treatment plan with the patient. The patient was provided an opportunity to ask questions and all were answered. The patient agreed with the plan and demonstrated an understanding  of the instructions.   The patient was advised to call back or seek an in-person evaluation if the symptoms worsen or if the condition fails to improve as anticipated.  I provided  60 minutes dedicated to the care of this patient via video on the date of this encounter to include chart review, face-to-face time with the patient, medication management/counseling, coordination of care with primary care provider.  Elsie Lincoln, MD 8/22/202410:44 AM

## 2022-09-19 NOTE — Patient Instructions (Addendum)
We did not make any medication changes today.  If the suicidal ideation should worsen call 911 or [phone] which is a mental health specific crisis line available 24/7.  You could also reach out to [facility] at [phone].  I will coordinate with your PCP for some blood work and other tests.

## 2022-09-20 ENCOUNTER — Ambulatory Visit (HOSPITAL_BASED_OUTPATIENT_CLINIC_OR_DEPARTMENT_OTHER)
Admission: RE | Admit: 2022-09-20 | Discharge: 2022-09-20 | Disposition: A | Discharge status: Home / Self Care | Payer: MEDICAID | Source: Ambulatory Visit | Attending: Infectious Diseases | Admitting: Infectious Diseases

## 2022-09-20 DIAGNOSIS — K7400 Hepatic fibrosis, unspecified: Secondary | ICD-10-CM | POA: Diagnosis present

## 2022-09-20 DIAGNOSIS — Z8619 Personal history of other infectious and parasitic diseases: Secondary | ICD-10-CM | POA: Diagnosis present

## 2022-09-25 ENCOUNTER — Other Ambulatory Visit (HOSPITAL_COMMUNITY): Payer: Self-pay

## 2022-10-03 ENCOUNTER — Other Ambulatory Visit: Payer: Self-pay | Admitting: Internal Medicine

## 2022-10-08 ENCOUNTER — Telehealth (HOSPITAL_COMMUNITY): Payer: Self-pay

## 2022-10-08 DIAGNOSIS — F323 Major depressive disorder, single episode, severe with psychotic features: Secondary | ICD-10-CM

## 2022-10-08 DIAGNOSIS — F4000 Agoraphobia, unspecified: Secondary | ICD-10-CM

## 2022-10-08 DIAGNOSIS — G894 Chronic pain syndrome: Secondary | ICD-10-CM

## 2022-10-08 DIAGNOSIS — F4310 Post-traumatic stress disorder, unspecified: Secondary | ICD-10-CM

## 2022-10-08 DIAGNOSIS — F410 Panic disorder [episodic paroxysmal anxiety] without agoraphobia: Secondary | ICD-10-CM

## 2022-10-08 MED ORDER — DULOXETINE HCL 60 MG PO CPEP
60.0000 mg | ORAL_CAPSULE | Freq: Every day | ORAL | 2 refills | Status: DC
Start: 2022-10-08 — End: 2022-11-19

## 2022-10-08 NOTE — Telephone Encounter (Addendum)
[facility] in [place] faxed over a 90 day prescription request for pt's DULoxetine (CYMBALTA) 60 MG capsule. Pt is scheduled 11/19/22, last filled 09/19/22 with 2 refills. Please advise.   Cannot provide 90 pills at one time due to suicidal ideation. 30 day supply with 2 refills called in.

## 2022-10-11 ENCOUNTER — Ambulatory Visit: Payer: MEDICAID | Admitting: Student

## 2022-10-11 ENCOUNTER — Telehealth: Payer: Self-pay | Admitting: *Deleted

## 2022-10-11 ENCOUNTER — Encounter: Payer: Self-pay | Admitting: Student

## 2022-10-11 VITALS — BP 127/79 | HR 56 | Wt 187.0 lb

## 2022-10-11 DIAGNOSIS — F17210 Nicotine dependence, cigarettes, uncomplicated: Secondary | ICD-10-CM | POA: Diagnosis not present

## 2022-10-11 DIAGNOSIS — N3946 Mixed incontinence: Secondary | ICD-10-CM

## 2022-10-11 DIAGNOSIS — R42 Dizziness and giddiness: Secondary | ICD-10-CM | POA: Insufficient documentation

## 2022-10-11 DIAGNOSIS — J329 Chronic sinusitis, unspecified: Secondary | ICD-10-CM | POA: Insufficient documentation

## 2022-10-11 DIAGNOSIS — J0110 Acute frontal sinusitis, unspecified: Secondary | ICD-10-CM

## 2022-10-11 DIAGNOSIS — Z720 Tobacco use: Secondary | ICD-10-CM

## 2022-10-11 MED ORDER — AMOXICILLIN-POT CLAVULANATE 875-125 MG PO TABS: 1.0000 | ORAL_TABLET | Freq: Two times a day (BID) | ORAL | 0 refills | Status: AC

## 2022-10-11 NOTE — Assessment & Plan Note (Addendum)
Patient reports a long smoking history.  She says she is currently smoking around 4 cigarettes/day, but is also using a vape frequently.  She reports new worsening shortness of breath, including dyspnea when climbing 1 flight of stairs, requiring her to stop.  She says she is no longer able to exert herself as she used to.  She is motivated to quit smoking, but declines any medication or nicotine gum assistance at this time.  She has albuterol and has been using it more frequently than usual.  It looks like she has not had recent PFTs, so we will place in a referral.  Plan -Ambulatory referral for PFTs -Counseled patient on importance of quitting smoking

## 2022-10-11 NOTE — Assessment & Plan Note (Addendum)
Patient reports 2 weeks of congestion, some coughing with brownish mucus, and sinus pressure. She denies any recent sick contacts, fevers, chills. She said it started around 2 weeks ago and has not improved.  She says she has tried [employer] and allergy pills with minimal improvement.  On exam, some irritation is noted on the right side of her throat and there is some tenderness in her lymph nodes on that side.  No firm lymph nodes were noted.  Her ears were unremarkable and nares appeared clear. I think it is possible this was a viral sinus infection that developed into a bacterial infection, so we will try a course of antibiotics.   Plan -Augmentin 825-125 mg for 7 days -If symptoms do not improve, instructed patient to call us

## 2022-10-11 NOTE — Assessment & Plan Note (Addendum)
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  She denies any worsening or improvement of the symptoms at this time.  She denies any dysuria or hematuria.  I have filled out the paperwork for her incontinence supplies and put in an order for DME.  Her notes from urology are in the media tab, and may be useful for further chart review at a later visit to further characterize her symptoms.

## 2022-10-11 NOTE — Assessment & Plan Note (Addendum)
Patient reports that she has been feeling more dizzy than usual recently.  She notices especially when she gets up or bends over.  She reports that yesterday she felt so dizzy she was unable to get out of bed, and also felt nauseous with some vomiting.  She denies any recent falls.  She feels like her dizziness is improved today.  We checked orthostatics, which were negative. She is euvolemic on exam. Etiology of this dizziness is unclear, but I think given its acute onset in the setting of a sinus infection it is most likely a vestibular neuritis secondary to her infection.  We are treating her infection with a course of antibiotics, and I expect improvement of the dizziness as well.  If the dizziness does not improve, I instructed the patient to call our clinic and let us know.  I also gave her strict precautions to go to the emergency department if her dizziness is persistent, accompanied by fainting, or severe nausea/vomiting.

## 2022-10-11 NOTE — Patient Instructions (Signed)
Thank you, [name] for allowing [place] to provide your care today. Today we discussed her sinus infection and dizziness.  For your sinus infection, we are prescribing you a 7-week course of an antibiotic called Augmentin.  Please take this medication twice a day with a meal.  If you do not see any signs of improvement of your infection, please give [place] a call back after this 7-day course.  We also talked about your dizziness, which we think is likely worsened in the setting of your acute sinus infection.  Sometimes sinus infections can affect your balance.  If you continue to experience dizziness and this does not improve following resolution of your sinus infection, please let us know at your next visit.  If you experience prolonged dizziness and nausea/vomiting with no improvement, I recommend going to the emergency department for full evaluation.  I have put in a referral for pulmonary function test to evaluate you for COPD.  They should be giving you a call shortly to schedule an appointment.  Tests ordered today:  Pulmonary function tests  Referrals ordered today:   Referral Orders         Ambulatory Referral for DME      I have ordered the following medication/changed the following medications:   Stop the following medications: There are no discontinued medications.   Start the following medications: Meds ordered this encounter  Medications   amoxicillin-clavulanate (AUGMENTIN) 875-125 MG tablet    Sig: Take 1 tablet by mouth 2 (two) times daily for 7 days.    Dispense:  14 tablet    Refill:  0     Follow up: 2-3 months   We look forward to seeing you next time. Please call our clinic at [phone] if you have any questions or concerns. The best time to call is Monday-Friday from 9am-4pm, but there is someone available 24/7. If after hours or the weekend, call the main hospital number and ask for the Internal Medicine Resident On-Call. If you need medication refills,  please notify your pharmacy one week in advance and they will send [place] a request.   Thank you for trusting me with your care. Wishing you the best!   Annett Fabian, MD [facility]
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
## 2022-10-11 NOTE — Telephone Encounter (Signed)
Appointment Information  Name: Ledell, Shoji MRN: [id number]  Date: 10/23/2022 Status: Sch  Time: 11:00 AM Length: 60  Visit Type: B/A FULL PFT [1040] Copay: $0.00  Provider: Jerilynn Mages Department: [facility] THERAPY  Referring Provider: Tyson Alias CSN: [id number]  Notes: 9/13-Scheduled with office- they will notify pt of date/time/location  Made On: Change Notes: 10/11/2022 12:07 PM 10/11/2022 12:07 PM By: By: Juliet Rude, SHERRY F    Pt informed of appt-states she will see it on My Chart No inhaler use 4 hrs prior No caffeine use 4 hrs prior No smoking day of

## 2022-10-18 NOTE — Progress Notes (Signed)
Internal Medicine Clinic Attending  I was physically present during the key portions of the resident provided service and participated in the medical decision making of patient's management care. I reviewed pertinent patient test results.  The assessment, diagnosis, and plan were formulated together and I agree with the documentation in the resident's note.  Gust Rung, DO

## 2022-10-18 NOTE — Addendum Note (Signed)
Addended by: Carlynn Purl C on: 10/18/2022 01:11 PM   Modules accepted: Level of Service

## 2022-10-23 ENCOUNTER — Ambulatory Visit (HOSPITAL_COMMUNITY)
Admission: RE | Admit: 2022-10-23 | Discharge: 2022-10-23 | Disposition: A | Discharge status: Home / Self Care | Payer: MEDICAID | Source: Ambulatory Visit | Attending: Student in an Organized Health Care Education/Training Program | Admitting: Student in an Organized Health Care Education/Training Program

## 2022-10-23 DIAGNOSIS — Z720 Tobacco use: Secondary | ICD-10-CM | POA: Insufficient documentation

## 2022-10-23 LAB — PULMONARY FUNCTION TEST
DL/VA % pred: 89 %
DL/VA: 3.75 ml/min/mmHg/L
DLCO unc % pred: 81 %
DLCO unc: 16.97 ml/min/mmHg
FEF 25-75 Post: 2.51 L/sec
FEF 25-75 Pre: 2.36 L/sec
FEF2575-%Change-Post: 6 %
FEF2575-%Pred-Post: 106 %
FEF2575-%Pred-Pre: 99 %
FEV1-%Change-Post: 1 %
FEV1-%Pred-Post: 92 %
FEV1-%Pred-Pre: 90 %
FEV1-Post: 2.42 L
FEV1-Pre: 2.38 L
FEV1FVC-%Change-Post: 3 %
FEV1FVC-%Pred-Pre: 103 %
FEV6-%Change-Post: -2 %
FEV6-%Pred-Post: 87 %
FEV6-%Pred-Pre: 89 %
FEV6-Post: 2.88 L
FEV6-Pre: 2.95 L
FEV6FVC-%Pred-Post: 103 %
FEV6FVC-%Pred-Pre: 103 %
FVC-%Change-Post: -2 %
FVC-%Pred-Post: 84 %
FVC-%Pred-Pre: 86 %
FVC-Post: 2.88 L
FVC-Pre: 2.95 L
Post FEV1/FVC ratio: 84 %
Post FEV6/FVC ratio: 100 %
Pre FEV1/FVC ratio: 81 %
Pre FEV6/FVC Ratio: 100 %
RV % pred: 98 %
RV: 2.03 L
TLC % pred: 97 %
TLC: 5.08 L

## 2022-10-23 MED ORDER — ALBUTEROL SULFATE (2.5 MG/3ML) 0.083% IN NEBU
2.5000 mg | INHALATION_SOLUTION | Freq: Once | RESPIRATORY_TRACT | Status: AC
Start: 2022-10-23 — End: 2022-10-23
  Administered 2022-10-23: 2.5 mg via RESPIRATORY_TRACT

## 2022-11-19 ENCOUNTER — Telehealth (INDEPENDENT_AMBULATORY_CARE_PROVIDER_SITE_OTHER): Payer: MEDICAID | Admitting: Psychiatry

## 2022-11-19 ENCOUNTER — Encounter (HOSPITAL_COMMUNITY): Payer: Self-pay | Admitting: Psychiatry

## 2022-11-19 DIAGNOSIS — F333 Major depressive disorder, recurrent, severe with psychotic symptoms: Secondary | ICD-10-CM

## 2022-11-19 DIAGNOSIS — F411 Generalized anxiety disorder: Secondary | ICD-10-CM | POA: Diagnosis not present

## 2022-11-19 DIAGNOSIS — F5104 Psychophysiologic insomnia: Secondary | ICD-10-CM

## 2022-11-19 DIAGNOSIS — Z720 Tobacco use: Secondary | ICD-10-CM

## 2022-11-19 DIAGNOSIS — G894 Chronic pain syndrome: Secondary | ICD-10-CM

## 2022-11-19 DIAGNOSIS — F410 Panic disorder [episodic paroxysmal anxiety] without agoraphobia: Secondary | ICD-10-CM

## 2022-11-19 DIAGNOSIS — F603 Borderline personality disorder: Secondary | ICD-10-CM | POA: Diagnosis not present

## 2022-11-19 DIAGNOSIS — Z79899 Other long term (current) drug therapy: Secondary | ICD-10-CM

## 2022-11-19 DIAGNOSIS — F4000 Agoraphobia, unspecified: Secondary | ICD-10-CM | POA: Diagnosis not present

## 2022-11-19 DIAGNOSIS — Z9151 Personal history of suicidal behavior: Secondary | ICD-10-CM

## 2022-11-19 DIAGNOSIS — R45851 Suicidal ideations: Secondary | ICD-10-CM

## 2022-11-19 DIAGNOSIS — F323 Major depressive disorder, single episode, severe with psychotic features: Secondary | ICD-10-CM

## 2022-11-19 DIAGNOSIS — F4310 Post-traumatic stress disorder, unspecified: Secondary | ICD-10-CM

## 2022-11-19 MED ORDER — DULOXETINE HCL 60 MG PO CPEP
60.0000 mg | ORAL_CAPSULE | Freq: Every day | ORAL | 2 refills | Status: DC
Start: 2022-11-19 — End: 2022-12-17

## 2022-11-19 MED ORDER — QUETIAPINE FUMARATE 400 MG PO TABS
400.0000 mg | ORAL_TABLET | Freq: Every day | ORAL | 2 refills | Status: DC
Start: 2022-11-19 — End: 2022-12-17

## 2022-11-19 MED ORDER — GABAPENTIN 300 MG PO CAPS
300.0000 mg | ORAL_CAPSULE | Freq: Every day | ORAL | 2 refills | Status: DC
Start: 2022-11-19 — End: 2022-12-17
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
## 2022-11-19 NOTE — Patient Instructions (Addendum)
As we discussed, please have a conversation with your sister about coming into the hospital.  The best location to go to would be the behavioral health urgent care in [place] located that [address] [place], [place] [address] with phone number of [phone].  Please call my clinic and let me know how this conversation goes at [phone] and if you call after hours you can leave a message.  We will call in the morning if we have not heard from you. Other emergency numbers that you can utilize are: 911, [phone], [facility] [phone]), and to call my clinic during normal business hours. In the meantime, I will place a referral to the partial hospitalization program so please be on the lookout for a phone call from them.  I will also message your PCP to see if they can check your liver to see if it is safe to make other medication changes.  I will also see if they can recheck an EKG and A1c while you are on the Seroquel.

## 2022-11-20 ENCOUNTER — Telehealth (HOSPITAL_COMMUNITY): Payer: Self-pay | Admitting: Professional

## 2022-11-20 ENCOUNTER — Telehealth (HOSPITAL_COMMUNITY): Payer: Self-pay | Admitting: Licensed Clinical Social Worker

## 2022-11-21 ENCOUNTER — Telehealth (HOSPITAL_COMMUNITY): Payer: Self-pay | Admitting: Psychiatry
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
## 2022-11-26 ENCOUNTER — Telehealth (HOSPITAL_COMMUNITY): Payer: Self-pay | Admitting: Licensed Clinical Social Worker

## 2022-11-26 NOTE — Telephone Encounter (Signed)
Encounter opened in error

## 2022-11-27 ENCOUNTER — Other Ambulatory Visit: Payer: Self-pay

## 2022-11-27 ENCOUNTER — Other Ambulatory Visit: Payer: MEDICAID

## 2022-11-27 DIAGNOSIS — Z79899 Other long term (current) drug therapy: Secondary | ICD-10-CM

## 2022-11-27 DIAGNOSIS — B20 Human immunodeficiency virus [HIV] disease: Secondary | ICD-10-CM

## 2022-11-27 NOTE — Addendum Note (Signed)
Addended by: Harley Alto on: 11/27/2022 10:35 AM   Modules accepted: Orders

## 2022-11-28 LAB — CBC WITH DIFFERENTIAL/PLATELET
Absolute Lymphocytes: 1652 {cells}/uL (ref 850–3900)
Absolute Monocytes: 460 {cells}/uL (ref 200–950)
Basophils Absolute: 30 {cells}/uL (ref 0–200)
Basophils Relative: 0.5 %
Eosinophils Absolute: 59 {cells}/uL (ref 15–500)
Eosinophils Relative: 1 %
HCT: 43 % (ref 35.0–45.0)
Hemoglobin: 14.4 g/dL (ref 11.7–15.5)
MCH: 32.9 pg (ref 27.0–33.0)
MCHC: 33.5 g/dL (ref 32.0–36.0)
MCV: 98.2 fL (ref 80.0–100.0)
MPV: 11.3 fL (ref 7.5–12.5)
Monocytes Relative: 7.8 %
Neutro Abs: 3699 {cells}/uL (ref 1500–7800)
Neutrophils Relative %: 62.7 %
Platelets: 215 10*3/uL (ref 140–400)
RBC: 4.38 10*6/uL (ref 3.80–5.10)
RDW: 12.5 % (ref 11.0–15.0)
Total Lymphocyte: 28 %
WBC: 5.9 10*3/uL (ref 3.8–10.8)

## 2022-11-28 LAB — COMPLETE METABOLIC PANEL WITH GFR
AG Ratio: 1.4 (calc) (ref 1.0–2.5)
ALT: 20 U/L (ref 6–29)
AST: 20 U/L (ref 10–35)
Albumin: 4.7 g/dL (ref 3.6–5.1)
Alkaline phosphatase (APISO): 81 U/L (ref 37–153)
BUN: 12 mg/dL (ref 7–25)
CO2: 28 mmol/L (ref 20–32)
Calcium: 9.8 mg/dL (ref 8.6–10.4)
Chloride: 105 mmol/L (ref 98–110)
Creat: 0.8 mg/dL (ref 0.50–1.05)
Globulin: 3.3 g/dL (ref 1.9–3.7)
Glucose, Bld: 106 mg/dL — ABNORMAL HIGH (ref 65–99)
Potassium: 4.1 mmol/L (ref 3.5–5.3)
Sodium: 142 mmol/L (ref 135–146)
Total Bilirubin: 0.7 mg/dL (ref 0.2–1.2)
Total Protein: 8 g/dL (ref 6.1–8.1)
eGFR: 84 mL/min/{1.73_m2} (ref >=60)

## 2022-11-28 LAB — B12 AND FOLATE PANEL
Folate: 11.4 ng/mL
Vitamin B-12: 388 pg/mL (ref 200–1100)

## 2022-11-28 LAB — VITAMIN D 25 HYDROXY (VIT D DEFICIENCY, FRACTURES): Vit D, 25-Hydroxy: 21 ng/mL — ABNORMAL LOW (ref 30–100)

## 2022-11-28 LAB — IRON,TIBC AND FERRITIN PANEL
%SAT: 30 % (ref 16–45)
Ferritin: 43 ng/mL (ref 16–288)
Iron: 107 ug/dL (ref 45–160)
TIBC: 355 ug/dL (ref 250–450)

## 2022-12-17 ENCOUNTER — Encounter (HOSPITAL_COMMUNITY): Payer: Self-pay | Admitting: Psychiatry

## 2022-12-17 ENCOUNTER — Telehealth (INDEPENDENT_AMBULATORY_CARE_PROVIDER_SITE_OTHER): Payer: MEDICAID | Admitting: Psychiatry

## 2022-12-17 DIAGNOSIS — Z79899 Other long term (current) drug therapy: Secondary | ICD-10-CM

## 2022-12-17 DIAGNOSIS — F4001 Agoraphobia with panic disorder: Secondary | ICD-10-CM

## 2022-12-17 DIAGNOSIS — F17210 Nicotine dependence, cigarettes, uncomplicated: Secondary | ICD-10-CM

## 2022-12-17 DIAGNOSIS — F323 Major depressive disorder, single episode, severe with psychotic features: Secondary | ICD-10-CM

## 2022-12-17 DIAGNOSIS — F411 Generalized anxiety disorder: Secondary | ICD-10-CM

## 2022-12-17 DIAGNOSIS — G894 Chronic pain syndrome: Secondary | ICD-10-CM

## 2022-12-17 DIAGNOSIS — F603 Borderline personality disorder: Secondary | ICD-10-CM

## 2022-12-17 DIAGNOSIS — F17200 Nicotine dependence, unspecified, uncomplicated: Secondary | ICD-10-CM

## 2022-12-17 DIAGNOSIS — F410 Panic disorder [episodic paroxysmal anxiety] without agoraphobia: Secondary | ICD-10-CM

## 2022-12-17 DIAGNOSIS — F332 Major depressive disorder, recurrent severe without psychotic features: Secondary | ICD-10-CM

## 2022-12-17 DIAGNOSIS — R45851 Suicidal ideations: Secondary | ICD-10-CM

## 2022-12-17 DIAGNOSIS — F4310 Post-traumatic stress disorder, unspecified: Secondary | ICD-10-CM

## 2022-12-17 DIAGNOSIS — F5104 Psychophysiologic insomnia: Secondary | ICD-10-CM

## 2022-12-17 DIAGNOSIS — F4000 Agoraphobia, unspecified: Secondary | ICD-10-CM

## 2022-12-17 MED ORDER — GABAPENTIN 300 MG PO CAPS
300.0000 mg | ORAL_CAPSULE | Freq: Every day | ORAL | 0 refills | Status: AC
Start: 2022-12-17 — End: ?

## 2022-12-17 MED ORDER — DULOXETINE HCL 60 MG PO CPEP
60.0000 mg | ORAL_CAPSULE | Freq: Every day | ORAL | 0 refills | Status: DC
Start: 2022-12-17 — End: 2023-02-11

## 2022-12-17 MED ORDER — QUETIAPINE FUMARATE 400 MG PO TABS
400.0000 mg | ORAL_TABLET | Freq: Every day | ORAL | 0 refills | Status: DC
Start: 2022-12-17 — End: 2023-02-11

## 2022-12-17 NOTE — Patient Instructions (Signed)
We did not make any changes today.  I would encourage you to follow up with her PCP for any referral you may need for a new clinic as you are being dismissed from our clinic.
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
## 2023-01-02 ENCOUNTER — Other Ambulatory Visit: Payer: Self-pay

## 2023-01-02 ENCOUNTER — Other Ambulatory Visit: Payer: Self-pay | Admitting: Infectious Disease

## 2023-01-02 DIAGNOSIS — B20 Human immunodeficiency virus [HIV] disease: Secondary | ICD-10-CM

## 2023-01-02 DIAGNOSIS — J410 Simple chronic bronchitis: Secondary | ICD-10-CM

## 2023-01-02 MED ORDER — ALBUTEROL SULFATE HFA 108 (90 BASE) MCG/ACT IN AERS: 2.0000 | INHALATION_SPRAY | Freq: Four times a day (QID) | RESPIRATORY_TRACT | 6 refills | Status: DC | PRN

## 2023-01-20 ENCOUNTER — Encounter: Payer: MEDICAID | Admitting: Internal Medicine
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
## 2023-02-11 ENCOUNTER — Encounter: Payer: Self-pay | Admitting: Student

## 2023-02-11 ENCOUNTER — Ambulatory Visit: Payer: MEDICAID | Admitting: Student

## 2023-02-11 ENCOUNTER — Other Ambulatory Visit: Payer: Self-pay

## 2023-02-11 VITALS — BP 131/83 | HR 81 | Temp 98.1°F | Ht 65.0 in | Wt 184.7 lb

## 2023-02-11 DIAGNOSIS — M542 Cervicalgia: Secondary | ICD-10-CM | POA: Insufficient documentation

## 2023-02-11 DIAGNOSIS — F410 Panic disorder [episodic paroxysmal anxiety] without agoraphobia: Secondary | ICD-10-CM

## 2023-02-11 DIAGNOSIS — F411 Generalized anxiety disorder: Secondary | ICD-10-CM

## 2023-02-11 DIAGNOSIS — Z122 Encounter for screening for malignant neoplasm of respiratory organs: Secondary | ICD-10-CM | POA: Insufficient documentation

## 2023-02-11 DIAGNOSIS — G894 Chronic pain syndrome: Secondary | ICD-10-CM

## 2023-02-11 DIAGNOSIS — F5104 Psychophysiologic insomnia: Secondary | ICD-10-CM

## 2023-02-11 DIAGNOSIS — F323 Major depressive disorder, single episode, severe with psychotic features: Secondary | ICD-10-CM

## 2023-02-11 DIAGNOSIS — Z23 Encounter for immunization: Secondary | ICD-10-CM | POA: Diagnosis not present

## 2023-02-11 DIAGNOSIS — F4000 Agoraphobia, unspecified: Secondary | ICD-10-CM | POA: Diagnosis not present

## 2023-02-11 DIAGNOSIS — F4310 Post-traumatic stress disorder, unspecified: Secondary | ICD-10-CM

## 2023-02-11 DIAGNOSIS — F603 Borderline personality disorder: Secondary | ICD-10-CM

## 2023-02-11 MED ORDER — QUETIAPINE FUMARATE 400 MG PO TABS: 400.0000 mg | ORAL_TABLET | Freq: Every day | ORAL | 0 refills | Status: AC

## 2023-02-11 MED ORDER — DICLOFENAC SODIUM 1 % EX GEL: 4.0000 g | Freq: Four times a day (QID) | CUTANEOUS | 1 refills | Status: AC

## 2023-02-11 MED ORDER — DULOXETINE HCL 60 MG PO CPEP
60.0000 mg | ORAL_CAPSULE | Freq: Every day | ORAL | 0 refills | Status: DC
Start: 2023-02-11 — End: 2023-08-18

## 2023-02-11 NOTE — Progress Notes (Signed)
 Internal Medicine Clinic Attending  Case discussed with the resident at the time of the visit.  We reviewed the resident's history and exam and pertinent patient test results.  I agree with the assessment, diagnosis, and plan of care documented in the resident's note.

## 2023-02-11 NOTE — Assessment & Plan Note (Signed)
-   Will order low dose CT scan for lung cancer screening

## 2023-02-11 NOTE — Assessment & Plan Note (Signed)
 Patient has a history of anxiety and depression.  Patient recently stated that she is not comfortable going to her psychiatrist and her therapist.  She reports that she would like someone new.  Patient referred to [facility].  Plan: -Patient referred to integrative behavioral health

## 2023-02-11 NOTE — Assessment & Plan Note (Signed)
Pneumonia vaccine administered today.

## 2023-02-11 NOTE — Assessment & Plan Note (Signed)
 Patient endorses 2-week history of neck pain.  She reports it to be achy in nature.  She denies any incident of trauma or any inciting event.  She states that it hurts when she moves her neck in any way.  She denies any relief of the pain.  She has not tried anything for the pain.  On my exam, patient has no cervical spinal tenderness.  No cervical paraspinal muscle tenderness.  Negative Spurling's test.  This is likely just inflammation of her paraspinal muscles.  Do not think this is a fracture or any disc herniation.  No red flag symptoms.  Will treat conservatively.  Plan: -PT -Voltaren  gel
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
## 2023-02-11 NOTE — Patient Instructions (Addendum)
 Mackenzie Gonzalez,Thank you for allowing me to take part in your care today.  Here are your instructions.  1.  Regarding your neck pain, I am referring you for physical therapy and given the Voltaren  gel.  Please have your sister apply Voltaren  gel to your neck.  This will help with your inflammation.  2.  I am referring you for a lung CT to screen for lung cancer screening   3.  We have given you your pneumonia vaccine today.  You may develop a fever.  Please take Tylenol  for this.  4.  Please continue to follow-up with your infectious disease doctors.  Please follow-up with us  in 6 months.  Thank you, Dr. Tobie  If you have any other questions please contact the internal medicine clinic at [phone] If it is after hours, please call the [facility] at [phone] and then ask the person who picks up for the resident on call.

## 2023-02-26 ENCOUNTER — Ambulatory Visit: Payer: MEDICAID | Admitting: Licensed Clinical Social Worker

## 2023-02-26 ENCOUNTER — Ambulatory Visit: Payer: MEDICAID | Attending: Internal Medicine | Admitting: Physical Therapy

## 2023-02-26 ENCOUNTER — Other Ambulatory Visit: Payer: Self-pay

## 2023-02-26 ENCOUNTER — Encounter: Payer: Self-pay | Admitting: Physical Therapy

## 2023-02-26 DIAGNOSIS — F323 Major depressive disorder, single episode, severe with psychotic features: Secondary | ICD-10-CM

## 2023-02-26 DIAGNOSIS — M542 Cervicalgia: Secondary | ICD-10-CM | POA: Diagnosis present

## 2023-02-26 DIAGNOSIS — M6281 Muscle weakness (generalized): Secondary | ICD-10-CM | POA: Diagnosis present
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
## 2023-02-26 NOTE — BH Specialist Note (Signed)
Patient no-showed today's appointment; appointment was for Telephone visit at 2:30Pm  Behavioral Health Clinician [facility] from here on out)  attempted patient twice via Telephone number on file.  [facility] contacted patient from telephone number [phone]. [facility] unable to leave  VM.   Patient will need to reschedule appointment by calling Internal medicine center [phone].  Christen Butter, MSW, LCSW-A She/Her Behavioral Health Clinician [facility]  Internal Medicine Center Direct Dial:[phone]  Fax [phone] Main Office Phone: [phone] [address] [place] [place], [place] [address] Website: [facility] Internal Medicine [facility]  [place], [place]  [place]

## 2023-03-05 ENCOUNTER — Ambulatory Visit: Payer: MEDICAID | Attending: Internal Medicine

## 2023-03-05 DIAGNOSIS — M6281 Muscle weakness (generalized): Secondary | ICD-10-CM | POA: Diagnosis present

## 2023-03-05 DIAGNOSIS — M542 Cervicalgia: Secondary | ICD-10-CM | POA: Diagnosis present
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
## 2023-03-12 ENCOUNTER — Ambulatory Visit: Payer: MEDICAID

## 2023-03-12 DIAGNOSIS — M542 Cervicalgia: Secondary | ICD-10-CM | POA: Diagnosis not present

## 2023-03-12 DIAGNOSIS — M6281 Muscle weakness (generalized): Secondary | ICD-10-CM
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
## 2023-03-17 ENCOUNTER — Ambulatory Visit (HOSPITAL_COMMUNITY)
Admission: RE | Admit: 2023-03-17 | Discharge: 2023-03-17 | Disposition: A | Discharge status: Home / Self Care | Payer: MEDICAID | Source: Ambulatory Visit | Attending: Internal Medicine | Admitting: Internal Medicine

## 2023-03-17 DIAGNOSIS — Z122 Encounter for screening for malignant neoplasm of respiratory organs: Secondary | ICD-10-CM | POA: Insufficient documentation

## 2023-03-19 ENCOUNTER — Ambulatory Visit: Payer: MEDICAID

## 2023-03-26 ENCOUNTER — Ambulatory Visit: Payer: MEDICAID

## 2023-03-26 DIAGNOSIS — M6281 Muscle weakness (generalized): Secondary | ICD-10-CM

## 2023-03-26 DIAGNOSIS — M542 Cervicalgia: Secondary | ICD-10-CM
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
## 2023-03-31 ENCOUNTER — Encounter: Payer: MEDICAID | Admitting: Internal Medicine
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
## 2023-04-01 ENCOUNTER — Other Ambulatory Visit: Payer: Self-pay | Admitting: Internal Medicine

## 2023-04-01 NOTE — Telephone Encounter (Signed)
 Medication sent to pharmacy

## 2023-04-02 ENCOUNTER — Ambulatory Visit: Payer: MEDICAID | Attending: Internal Medicine

## 2023-04-02 DIAGNOSIS — M542 Cervicalgia: Secondary | ICD-10-CM | POA: Insufficient documentation

## 2023-04-02 DIAGNOSIS — M6281 Muscle weakness (generalized): Secondary | ICD-10-CM | POA: Diagnosis present
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
## 2023-04-04 ENCOUNTER — Encounter: Payer: Self-pay | Admitting: Internal Medicine

## 2023-04-04 ENCOUNTER — Encounter: Payer: Self-pay | Admitting: Student

## 2023-04-04 DIAGNOSIS — I700 Atherosclerosis of aorta: Secondary | ICD-10-CM | POA: Insufficient documentation

## 2023-04-04 DIAGNOSIS — J439 Emphysema, unspecified: Secondary | ICD-10-CM | POA: Insufficient documentation

## 2023-04-07 ENCOUNTER — Ambulatory Visit: Payer: MEDICAID | Admitting: Student

## 2023-04-07 ENCOUNTER — Encounter: Payer: Self-pay | Admitting: Student

## 2023-04-07 VITALS — BP 135/82 | HR 73 | Temp 97.9°F | Ht 65.0 in | Wt 181.9 lb

## 2023-04-07 DIAGNOSIS — I6350 Cerebral infarction due to unspecified occlusion or stenosis of unspecified cerebral artery: Secondary | ICD-10-CM | POA: Diagnosis not present

## 2023-04-07 DIAGNOSIS — G894 Chronic pain syndrome: Secondary | ICD-10-CM | POA: Diagnosis not present

## 2023-04-07 DIAGNOSIS — R109 Unspecified abdominal pain: Secondary | ICD-10-CM

## 2023-04-07 DIAGNOSIS — B20 Human immunodeficiency virus [HIV] disease: Secondary | ICD-10-CM

## 2023-04-07 DIAGNOSIS — G8929 Other chronic pain: Secondary | ICD-10-CM | POA: Insufficient documentation

## 2023-04-07 MED ORDER — DICYCLOMINE HCL 20 MG PO TABS
20.0000 mg | ORAL_TABLET | Freq: Three times a day (TID) | ORAL | 5 refills | Status: AC
Start: 2023-04-07 — End: ?
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
## 2023-04-07 NOTE — Assessment & Plan Note (Signed)
 R ischemic pontine stroke in 2022. Residual L weakness and facial droop are mild. Continues to take aspirin. No changes.

## 2023-04-07 NOTE — Patient Instructions (Signed)
 Ms Teall,  It was a pleasure seeing you today. Please expect a phone-call to see an orthopedist about your neck.   Please contact your GI doctor at [facility]. You might need another scope of your esophagus and stomach.  In the meantime, keep taking fiber.  - Dr Ninfa Meeker

## 2023-04-07 NOTE — Assessment & Plan Note (Signed)
 She continues to see ID. On Biktarvy, reports good adherence. Last checkup reported great control.

## 2023-04-07 NOTE — Assessment & Plan Note (Addendum)
 She is complaining of left middle and upper quadrant abdominal pain that is "twisting" in nature.  Ongoing over 1 year but getting worse. The pain occurs after meals and lasts for up to an hour.  The pain also occurs with certain movements or with no clear inciting factor. The pain goes away on its own. Bowel habits are varied, she experiences constipation, diarrhea, and normal stools, and symptoms are occasionally relieved after. She has dark stools on rare occasion. She takes fiber. She saw GI last in 2023 when she had endoscopy that revealed severe esophagitis and she remains on protonix that relieves heartburn and in the past having hematemesis due to this. She does not have belching or severe recent weight loss. She had CT in 2024 that revealed diverticulosis. Her Carnett sign is positive today. She does not have distension or mass, but she is mildly tender across the L abdomen.   I suspect her pain is multifactorial. Given her exam and history, I suspect there a musculoskeletal component that might be relieved by trigger injection. I doubt gallbladder disease. Given her esophagitis, I think another EGD might benefit her and help rule out ulcers. Would also consider IBS, I see she was on bentyl in the past and will restart that. Lower suspicion for chronic pancreatitis/mesenteric ischemia at this point, but could consider in future. - she will reconnect with [facility] - continue fiber, pantoprazole 40 BID - plan return appt for trigger injection when Dr. Mikey Bussing is in - Restart Bentyl 20mg  QID with meals and at night

## 2023-04-08 ENCOUNTER — Telehealth: Payer: Self-pay | Admitting: *Deleted

## 2023-04-08 NOTE — Progress Notes (Signed)
 Internal Medicine Clinic Attending  Case discussed with the resident at the time of the visit.  We reviewed the resident's history and exam and pertinent patient test results.  I agree with the assessment, diagnosis, and plan of care documented in the resident's note.

## 2023-04-08 NOTE — Telephone Encounter (Signed)
 Copied from CRM [id number]. Topic: Appointments - Scheduling Inquiry for Clinic >> Apr 08, 2023  3:15 PM Maree Krabbe H wrote: Reason for CRM: I made this patient an appointment but he stated he wanted an injection in his back for the pain. Could you check and make sure its made correctly and if its something I need to change I will or something you all have to do.

## 2023-04-08 NOTE — Telephone Encounter (Signed)
 After talking to Dr Ninfa Meeker, I explained to pt her appt is not for neck/back injection but trigger finger injection. This was explained to the pt. Call transferred to front office to schedule pt an appt when Dr Mikey Bussing is attending. Appt schedule w/Dr Nooruddin 3/19.

## 2023-04-10 ENCOUNTER — Encounter: Payer: Self-pay | Admitting: Physical Therapy

## 2023-04-10 ENCOUNTER — Ambulatory Visit: Payer: MEDICAID | Admitting: Physical Therapy

## 2023-04-10 DIAGNOSIS — M6281 Muscle weakness (generalized): Secondary | ICD-10-CM

## 2023-04-10 DIAGNOSIS — M542 Cervicalgia: Secondary | ICD-10-CM
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
## 2023-04-16 ENCOUNTER — Ambulatory Visit: Payer: MEDICAID | Admitting: Student

## 2023-04-16 VITALS — BP 154/89 | HR 69 | Temp 98.2°F | Ht 65.0 in | Wt 184.5 lb

## 2023-04-16 DIAGNOSIS — G894 Chronic pain syndrome: Secondary | ICD-10-CM

## 2023-04-16 DIAGNOSIS — G8929 Other chronic pain: Secondary | ICD-10-CM | POA: Diagnosis not present

## 2023-04-16 DIAGNOSIS — M7552 Bursitis of left shoulder: Secondary | ICD-10-CM | POA: Diagnosis not present

## 2023-04-16 MED ORDER — TRIAMCINOLONE ACETONIDE 40 MG/ML IJ SUSP
40.0000 mg | Freq: Once | INTRAMUSCULAR | Status: AC
Start: 2023-04-16 — End: 2023-04-16
  Administered 2023-04-16: 40 mg via INTRAMUSCULAR

## 2023-04-16 MED ORDER — LIDOCAINE HCL (PF) 1 % IJ SOLN
2.0000 mL | Freq: Once | INTRAMUSCULAR | Status: DC
Start: 2023-04-16 — End: 2023-04-16

## 2023-04-16 NOTE — Patient Instructions (Signed)
 Thank you so much for coming to the clinic today!   Today, we did a shoulder injection. I will let you know on the status of the orthopedic referral, right now it's pending insurance approval.   If you have any questions please feel free to the call the clinic at anytime at [phone]. It was a pleasure seeing you!  Best, Dr. Thomasene Ripple
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
## 2023-04-16 NOTE — Assessment & Plan Note (Signed)
 Pt presents for follow up from last visit.  Initial plan was to perform an abdominal injection given her chronic abdominal pain, however she was given Bentyl and this seems to have helped her pain significantly.  She still having musculoskeletal/radicular pain throughout her body, most notably in the left shoulder.  On physical exam, she still has good range of motion of both arms, with pain elicited on palpation of the lateral and posterior shoulder joint.  Patient consented to a shoulder injection, and subsequently was performed without complications.  She is to follow-up with orthopedics for potential back injections for chronic back pain.  If that does not work, may need to consider stronger medications for her chronic pain.  Plan: - Shoulder injection left

## 2023-04-17 ENCOUNTER — Encounter: Payer: MEDICAID | Admitting: Physical Therapy

## 2023-04-24 ENCOUNTER — Ambulatory Visit: Payer: MEDICAID

## 2023-04-24 ENCOUNTER — Encounter: Payer: MEDICAID | Admitting: Physical Therapy

## 2023-04-24 ENCOUNTER — Other Ambulatory Visit: Payer: Self-pay

## 2023-04-30 NOTE — Progress Notes (Signed)
 Internal Medicine Clinic Attending  I was physically present during the key portions of the resident provided service and participated in the medical decision making of patient's management care. I reviewed pertinent patient test results.  The assessment, diagnosis, and plan were formulated together and I agree with the documentation in the resident's note. I was present for the entire procedure. To correct the note however only 40mg  (1ml) of kenalog was injected. Gust Rung, DO

## 2023-05-01 ENCOUNTER — Other Ambulatory Visit (HOSPITAL_COMMUNITY): Payer: Self-pay

## 2023-05-01 ENCOUNTER — Encounter: Payer: MEDICAID | Admitting: Physical Therapy

## 2023-05-02 ENCOUNTER — Ambulatory Visit: Payer: MEDICAID | Admitting: Physician Assistant

## 2023-05-05 ENCOUNTER — Encounter: Payer: Self-pay | Admitting: Physical Medicine and Rehabilitation

## 2023-05-05 ENCOUNTER — Ambulatory Visit (INDEPENDENT_AMBULATORY_CARE_PROVIDER_SITE_OTHER): Payer: MEDICAID | Admitting: Physical Medicine and Rehabilitation

## 2023-05-05 ENCOUNTER — Other Ambulatory Visit (INDEPENDENT_AMBULATORY_CARE_PROVIDER_SITE_OTHER): Payer: MEDICAID

## 2023-05-05 VITALS — BP 170/84 | HR 66

## 2023-05-05 DIAGNOSIS — M5442 Lumbago with sciatica, left side: Secondary | ICD-10-CM

## 2023-05-05 DIAGNOSIS — M5416 Radiculopathy, lumbar region: Secondary | ICD-10-CM | POA: Diagnosis not present

## 2023-05-05 DIAGNOSIS — M5441 Lumbago with sciatica, right side: Secondary | ICD-10-CM

## 2023-05-05 DIAGNOSIS — G894 Chronic pain syndrome: Secondary | ICD-10-CM | POA: Diagnosis not present

## 2023-05-05 DIAGNOSIS — G8929 Other chronic pain: Secondary | ICD-10-CM | POA: Diagnosis not present

## 2023-05-05 NOTE — Progress Notes (Unsigned)
 Pain Scale   Average Pain 8   Patient presents today for neck and lower back pain.Her neck has been hurting for a month. She had a shot for her neck a couple weeks ago and that helped a little. She occasionally has pain that will radiate to the left shoulder. Some numbness and tingling in her left arm. Lying on her back helps her neck pain. Lower back pain for years. Pain travels down right leg to the level of her knee. No numbess or tingling in her legs. She takes Tylenol and has Gabapentin, but only takes occasionally.       +Driver, -BT, -Dye Allergies.
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
## 2023-05-08 ENCOUNTER — Encounter: Payer: MEDICAID | Admitting: Physical Therapy

## 2023-05-14 ENCOUNTER — Encounter: Payer: Self-pay | Admitting: Physical Medicine and Rehabilitation

## 2023-05-15 ENCOUNTER — Ambulatory Visit
Admission: RE | Admit: 2023-05-15 | Discharge: 2023-05-15 | Disposition: A | Discharge status: Home / Self Care | Payer: MEDICAID | Source: Ambulatory Visit | Attending: Physical Medicine and Rehabilitation | Admitting: Physical Medicine and Rehabilitation

## 2023-05-15 ENCOUNTER — Encounter: Payer: MEDICAID | Admitting: Physical Therapy

## 2023-05-15 DIAGNOSIS — M5416 Radiculopathy, lumbar region: Secondary | ICD-10-CM

## 2023-05-15 DIAGNOSIS — G8929 Other chronic pain: Secondary | ICD-10-CM

## 2023-05-22 ENCOUNTER — Encounter: Payer: MEDICAID | Admitting: Physical Therapy

## 2023-05-22 ENCOUNTER — Ambulatory Visit: Payer: Self-pay | Admitting: Internal Medicine

## 2023-06-05 ENCOUNTER — Ambulatory Visit: Payer: MEDICAID | Admitting: Physical Medicine and Rehabilitation

## 2023-06-05 DIAGNOSIS — M5441 Lumbago with sciatica, right side: Secondary | ICD-10-CM | POA: Diagnosis not present

## 2023-06-05 DIAGNOSIS — G894 Chronic pain syndrome: Secondary | ICD-10-CM

## 2023-06-05 DIAGNOSIS — G8929 Other chronic pain: Secondary | ICD-10-CM

## 2023-06-05 DIAGNOSIS — M5442 Lumbago with sciatica, left side: Secondary | ICD-10-CM | POA: Diagnosis not present

## 2023-06-05 DIAGNOSIS — M5416 Radiculopathy, lumbar region: Secondary | ICD-10-CM
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
## 2023-06-05 NOTE — Progress Notes (Signed)
 Pain Scale   Average Pain 8 Patient advising her pain is constant in her Lower back. Patient here for MRI review        +Driver, -BT, -Dye Allergies.

## 2023-06-06 ENCOUNTER — Other Ambulatory Visit: Payer: Self-pay | Admitting: Physical Medicine and Rehabilitation

## 2023-06-06 ENCOUNTER — Encounter: Payer: Self-pay | Admitting: Physical Medicine and Rehabilitation

## 2023-06-06 MED ORDER — DIAZEPAM 5 MG PO TABS
ORAL_TABLET | ORAL | 0 refills | Status: DC
Start: 2023-06-06 — End: 2023-09-02

## 2023-06-18 ENCOUNTER — Other Ambulatory Visit: Payer: Self-pay

## 2023-06-18 ENCOUNTER — Ambulatory Visit: Payer: MEDICAID | Admitting: Physical Medicine and Rehabilitation

## 2023-06-18 VITALS — BP 144/83 | HR 105

## 2023-06-18 DIAGNOSIS — M5416 Radiculopathy, lumbar region: Secondary | ICD-10-CM

## 2023-06-18 MED ORDER — METHYLPREDNISOLONE ACETATE 40 MG/ML IJ SUSP
40.0000 mg | Freq: Once | INTRAMUSCULAR | Status: AC
  Administered 2023-06-18: 40 mg

## 2023-06-18 NOTE — Progress Notes (Unsigned)
 Patient is having bilateral back pain with left side worse than right. She says that she does have pain, numbness, and tingling in the legs.  No blood thinners. No allergy to dye.

## 2023-06-18 NOTE — Patient Instructions (Signed)

## 2023-06-24 ENCOUNTER — Encounter: Payer: Self-pay | Admitting: Infectious Diseases

## 2023-06-24 ENCOUNTER — Ambulatory Visit (INDEPENDENT_AMBULATORY_CARE_PROVIDER_SITE_OTHER): Payer: MEDICAID | Admitting: Infectious Diseases

## 2023-06-24 ENCOUNTER — Other Ambulatory Visit: Payer: Self-pay

## 2023-06-24 VITALS — BP 138/83 | HR 70 | Temp 98.1°F | Ht 65.0 in | Wt 187.0 lb

## 2023-06-24 DIAGNOSIS — K7400 Hepatic fibrosis, unspecified: Secondary | ICD-10-CM | POA: Diagnosis not present

## 2023-06-24 DIAGNOSIS — B20 Human immunodeficiency virus [HIV] disease: Secondary | ICD-10-CM

## 2023-06-24 DIAGNOSIS — B001 Herpesviral vesicular dermatitis: Secondary | ICD-10-CM

## 2023-06-24 DIAGNOSIS — I639 Cerebral infarction, unspecified: Secondary | ICD-10-CM | POA: Diagnosis not present

## 2023-06-24 MED ORDER — ATORVASTATIN CALCIUM 80 MG PO TABS: 80.0000 mg | ORAL_TABLET | Freq: Every day | ORAL | 3 refills | Status: DC

## 2023-06-24 MED ORDER — VALACYCLOVIR HCL 1 G PO TABS
1000.0000 mg | ORAL_TABLET | Freq: Two times a day (BID) | ORAL | 3 refills | Status: DC
Start: 2023-06-24 — End: 2023-12-16

## 2023-06-24 MED ORDER — BIKTARVY 50-200-25 MG PO TABS: 1.0000 | ORAL_TABLET | Freq: Every day | ORAL | 3 refills | Status: DC

## 2023-06-24 NOTE — Patient Instructions (Addendum)
 I sent in refills for your Biktarvy , Lipitor  and your Valtrex .   [facility] - Same building as us  for primary care - call  [phone] to schedule a new patient appointment if you prefer to have the same doctor to work with.   Please go to the pharmacy in this building to get your first dose of Shingles vaccine - if we give it to you here you may get a bill.
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
## 2023-06-25 NOTE — Procedures (Signed)
 Lumbar Epidural Steroid Injection - Interlaminar Approach with Fluoroscopic Guidance  Patient: Mackenzie Gonzalez      Date of Birth: 1960/06/21 MRN: [id number] PCP: Vicente Graham, DO      Visit Date: 06/18/2023   Universal Protocol:     Consent Given By: the patient  Position: PRONE  Additional Comments: Vital signs were monitored before and after the procedure. Patient was prepped and draped in the usual sterile fashion. The correct patient, procedure, and site was verified.   Injection Procedure Details:   Procedure diagnoses: Lumbar radiculopathy [M54.16]   Meds Administered:  Meds ordered this encounter  Medications   methylPREDNISolone acetate (DEPO-MEDROL) injection 40 mg     Laterality: Right  Location/Site:  L4-5  Needle: 3.5 in., 20 ga. Tuohy  Needle Placement: Paramedian epidural  Findings:   -Comments: Excellent flow of contrast into the epidural space.  Procedure Details: Using a paramedian approach from the side mentioned above, the region overlying the inferior lamina was localized under fluoroscopic visualization and the soft tissues overlying this structure were infiltrated with 4 ml. of 1% Lidocaine  without Epinephrine. The Tuohy needle was inserted into the epidural space using a paramedian approach.   The epidural space was localized using loss of resistance along with counter oblique bi-planar fluoroscopic views.  After negative aspirate for air, blood, and CSF, a 2 ml. volume of Isovue-250 was injected into the epidural space and the flow of contrast was observed. Radiographs were obtained for documentation purposes.    The injectate was administered into the level noted above.   Additional Comments:  The patient tolerated the procedure well Dressing: 2 x 2 sterile gauze and Band-Aid    Post-procedure details: Patient was observed during the procedure. Post-procedure instructions were reviewed.  Patient left the clinic in stable condition.

## 2023-06-25 NOTE — Progress Notes (Signed)
 Mackenzie Gonzalez - 63 y.o. female MRN [id number]  Date of birth: March 01, 1960  Office Visit Note: Visit Date: 06/18/2023 PCP: Atway, Rayann N, DO Referred by: Atway, Rayann N, DO  Subjective: No chief complaint on file.  HPI:  Mackenzie Gonzalez is a 63 y.o. female who comes in today at the request of Elvan Hamel, FNP for planned Right L4-5 Lumbar Interlaminar epidural steroid injection with fluoroscopic guidance.  The patient has failed conservative care including home exercise, medications, time and activity modification.  This injection will be diagnostic and hopefully therapeutic.  Please see requesting physician notes for further details and justification.   ROS Otherwise per HPI.  Assessment & Plan: Visit Diagnoses:    ICD-10-CM   1. Lumbar radiculopathy  M54.16 XR C-ARM NO REPORT    Epidural Steroid injection    methylPREDNISolone  acetate (DEPO-MEDROL ) injection 40 mg      Plan: No additional findings.   Meds & Orders:  Meds ordered this encounter  Medications   methylPREDNISolone  acetate (DEPO-MEDROL ) injection 40 mg    Orders Placed This Encounter  Procedures   XR C-ARM NO REPORT   Epidural Steroid injection    Follow-up: Return for visit to requesting provider as needed.   Procedures: No procedures performed  Lumbar Epidural Steroid Injection - Interlaminar Approach with Fluoroscopic Guidance  Patient: Mackenzie Gonzalez      Date of Birth: 1960/10/08 MRN: [id number] PCP: Vicente Graham, DO      Visit Date: 06/18/2023   Universal Protocol:     Consent Given By: the patient  Position: PRONE  Additional Comments: Vital signs were monitored before and after the procedure. Patient was prepped and draped in the usual sterile fashion. The correct patient, procedure, and site was verified.   Injection Procedure Details:   Procedure diagnoses: Lumbar radiculopathy [M54.16]   Meds Administered:  Meds ordered this encounter  Medications   methylPREDNISolone   acetate (DEPO-MEDROL ) injection 40 mg     Laterality: Right  Location/Site:  L4-5  Needle: 3.5 in., 20 ga. Tuohy  Needle Placement: Paramedian epidural  Findings:   -Comments: Excellent flow of contrast into the epidural space.  Procedure Details: Using a paramedian approach from the side mentioned above, the region overlying the inferior lamina was localized under fluoroscopic visualization and the soft tissues overlying this structure were infiltrated with 4 ml. of 1% Lidocaine  without Epinephrine. The Tuohy needle was inserted into the epidural space using a paramedian approach.   The epidural space was localized using loss of resistance along with counter oblique bi-planar fluoroscopic views.  After negative aspirate for air, blood, and CSF, a 2 ml. volume of Isovue-250 was injected into the epidural space and the flow of contrast was observed. Radiographs were obtained for documentation purposes.    The injectate was administered into the level noted above.   Additional Comments:  The patient tolerated the procedure well Dressing: 2 x 2 sterile gauze and Band-Aid    Post-procedure details: Patient was observed during the procedure. Post-procedure instructions were reviewed.  Patient left the clinic in stable condition.   Clinical History: CLINICAL DATA:  Low back pain, symptoms persist with > 6 wks treatment.   EXAM: MRI LUMBAR SPINE WITHOUT CONTRAST   TECHNIQUE: Multiplanar, multisequence MR imaging of the lumbar spine was performed. No intravenous contrast was administered.   COMPARISON:  Lumbar spine radiographs 05/05/2023   FINDINGS: Segmentation:  Standard.   Alignment:  Trace anterolisthesis of L5 on S1.   Vertebrae:  No acute fracture or suspicious marrow lesion. Minimal chronic anterior wedging of the T12 vertebral body and mild chronic L4 vertebral body height loss. Marrow edema in the opposing L4 and L5 spinous processes with small cysts suggestive  of Baastrup disease.   Conus medullaris and cauda equina: Conus extends to the L1-2 level. Conus and cauda equina appear normal.   Paraspinal and other soft tissues: Unremarkable.   Disc levels:   Disc desiccation throughout the lumbar spine. Mild disc space narrowing from T11-12 through L1-2 and at L5-S1. 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   T11-12: Only imaged sagittally. A left paracentral to left foraminal disc protrusion with annular fissure results in mild spinal stenosis and moderate left neural foraminal stenosis.   T12-L1: A small to moderate-sized left paracentral disc extrusion with mild caudal migration results in minimal to mild left-sided spinal stenosis. Patent neural foramina.   L1-2: Minimal disc bulging and mild facet arthrosis without stenosis.   L2-3: Mild disc bulging and mild facet arthrosis result in mild bilateral lateral recess stenosis and minimal to mild spinal stenosis without neural foraminal stenosis.   L3-4: Mild circumferential disc bulging eccentric to the left and moderate facet arthrosis result in mild bilateral lateral recess stenosis and mild left neural foraminal stenosis without spinal stenosis.   L4-5: Disc bulging, a left foraminal to extraforaminal disc protrusion with annular fissure, and moderate facet and ligamentum flavum hypertrophy result in mild spinal stenosis, mild right and moderate left lateral recess stenosis, and mild right and moderate left neural foraminal stenosis. The left L4 and L5 nerve roots could be affected.   L5-S1: Trace anterolisthesis with mild bulging of uncovered disc and severe facet and asymmetric left-sided ligamentum flavum hypertrophy result in mild bilateral lateral recess stenosis and mild left neural foraminal stenosis without significant spinal stenosis.   IMPRESSION: 1. Multilevel lumbar disc and facet degeneration. 2. 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 3. Mild spinal stenosis and moderate left neural foraminal stenosis at T11-12.     Electronically Signed   By: Aundra Lee M.D.   On: 06/03/2023 16:30     Objective:  VS:  HT:    WT:   BMI:     BP:(!) 144/83  HR:(!) 105bpm  TEMP: ( )  RESP:  Physical Exam Vitals and nursing note reviewed.  Constitutional:      General: She is not in acute distress.    Appearance: Normal appearance. She is not ill-appearing.  HENT:     Head: Normocephalic and atraumatic.     Right Ear: External ear normal.     Left Ear: External ear normal.  Eyes:     Extraocular Movements: Extraocular movements intact.  Cardiovascular:     Rate and Rhythm: Normal rate.     Pulses: Normal pulses.  Pulmonary:     Effort: Pulmonary effort is normal. No respiratory distress.  Abdominal:     General: There is no distension.     Palpations: Abdomen is soft.  Musculoskeletal:        General: Tenderness present.     Cervical back: Neck supple.     Right lower leg: No edema.     Left lower leg: No edema.     Comments: Patient has good distal strength with no pain over the greater trochanters.  No clonus or focal weakness.  Skin:  Findings: No erythema, lesion or rash.  Neurological:     General: No focal deficit present.     Mental Status: She is alert and oriented to person, place, and time.     Sensory: No sensory deficit.     Motor: No weakness or abnormal muscle tone.     Coordination: Coordination normal.  Psychiatric:        Mood and Affect: Mood normal.        Behavior: Behavior normal.      Imaging: No results found.

## 2023-06-27 NOTE — Progress Notes (Signed)
 The ASCVD Risk score (Arnett DK, et al., 2019) failed to calculate for the following reasons:   Risk score cannot be calculated because patient has a medical history suggesting prior/existing ASCVD  Arlon Bergamo, BSN, RN

## 2023-06-29 ENCOUNTER — Other Ambulatory Visit: Payer: Self-pay | Admitting: Internal Medicine

## 2023-06-30 NOTE — Telephone Encounter (Signed)
 Medication sent to pharmacy

## 2023-07-02 ENCOUNTER — Ambulatory Visit: Payer: Self-pay | Admitting: Infectious Diseases

## 2023-07-02 DIAGNOSIS — K7400 Hepatic fibrosis, unspecified: Secondary | ICD-10-CM

## 2023-07-02 DIAGNOSIS — Z8619 Personal history of other infectious and parasitic diseases: Secondary | ICD-10-CM

## 2023-07-04 LAB — LIVER FIBROSIS, FIBROTEST-ACTITEST
ALT: 18 U/L (ref 6–29)
Alpha-2-Macroglobulin: 405 mg/dL — ABNORMAL HIGH (ref 106–279)
Apolipoprotein A1: 174 mg/dL (ref 101–198)
Bilirubin: 0.5 mg/dL (ref 0.2–1.2)
Fibrosis Score: 0.35
GGT: 16 U/L (ref 3–65)
Haptoglobin: 167 mg/dL (ref 43–212)
Necroinflammat ACT Score: 0.07
Reference ID: 5516256

## 2023-07-04 LAB — CBC WITH DIFFERENTIAL/PLATELET
Absolute Lymphocytes: 2275 {cells}/uL (ref 850–3900)
Absolute Monocytes: 508 {cells}/uL (ref 200–950)
Basophils Absolute: 31 {cells}/uL (ref 0–200)
Basophils Relative: 0.5 %
Eosinophils Absolute: 99 {cells}/uL (ref 15–500)
Eosinophils Relative: 1.6 %
HCT: 42.6 % (ref 35.0–45.0)
Hemoglobin: 14 g/dL (ref 11.7–15.5)
MCH: 33 pg (ref 27.0–33.0)
MCHC: 32.9 g/dL (ref 32.0–36.0)
MCV: 100.5 fL — ABNORMAL HIGH (ref 80.0–100.0)
MPV: 11.6 fL (ref 7.5–12.5)
Monocytes Relative: 8.2 %
Neutro Abs: 3286 {cells}/uL (ref 1500–7800)
Neutrophils Relative %: 53 %
Platelets: 214 10*3/uL (ref 140–400)
RBC: 4.24 10*6/uL (ref 3.80–5.10)
RDW: 13.6 % (ref 11.0–15.0)
Total Lymphocyte: 36.7 %
WBC: 6.2 10*3/uL (ref 3.8–10.8)

## 2023-07-04 LAB — LIPID PANEL
Cholesterol: 165 mg/dL (ref <200)
HDL: 64 mg/dL (ref >=50)
LDL Cholesterol (Calc): 78 mg/dL
Non-HDL Cholesterol (Calc): 101 mg/dL (ref <130)
Total CHOL/HDL Ratio: 2.6 (calc) (ref <5.0)
Triglycerides: 130 mg/dL (ref <150)

## 2023-07-04 LAB — COMPLETE METABOLIC PANEL WITHOUT GFR
AG Ratio: 1.5 (calc) (ref 1.0–2.5)
ALT: 19 U/L (ref 6–29)
AST: 15 U/L (ref 10–35)
Albumin: 4.5 g/dL (ref 3.6–5.1)
Alkaline phosphatase (APISO): 70 U/L (ref 37–153)
BUN: 17 mg/dL (ref 7–25)
CO2: 27 mmol/L (ref 20–32)
Calcium: 9.5 mg/dL (ref 8.6–10.4)
Chloride: 104 mmol/L (ref 98–110)
Creat: 0.85 mg/dL (ref 0.50–1.05)
Globulin: 3 g/dL (ref 1.9–3.7)
Glucose, Bld: 87 mg/dL (ref 65–99)
Potassium: 4.3 mmol/L (ref 3.5–5.3)
Sodium: 139 mmol/L (ref 135–146)
Total Bilirubin: 0.5 mg/dL (ref 0.2–1.2)
Total Protein: 7.5 g/dL (ref 6.1–8.1)

## 2023-07-04 LAB — T-HELPER CELLS (CD4) COUNT (NOT AT ARMC)
Absolute CD4: 1008 {cells}/uL (ref 490–1740)
CD4 T Helper %: 44 % (ref 30–61)
Total lymphocyte count: 2304 {cells}/uL (ref 850–3900)

## 2023-07-04 LAB — HIV-1 RNA QUANT-NO REFLEX-BLD
HIV 1 RNA Quant: NOT DETECTED {copies}/mL
HIV-1 RNA Quant, Log: NOT DETECTED {Log_copies}/mL

## 2023-07-04 LAB — RPR: RPR Ser Ql: NONREACTIVE

## 2023-07-15 ENCOUNTER — Other Ambulatory Visit (HOSPITAL_BASED_OUTPATIENT_CLINIC_OR_DEPARTMENT_OTHER): Payer: MEDICAID

## 2023-07-16 ENCOUNTER — Ambulatory Visit (HOSPITAL_BASED_OUTPATIENT_CLINIC_OR_DEPARTMENT_OTHER)
Admission: RE | Admit: 2023-07-16 | Discharge: 2023-07-16 | Disposition: A | Discharge status: Home / Self Care | Payer: MEDICAID | Source: Ambulatory Visit | Attending: Infectious Diseases | Admitting: Infectious Diseases

## 2023-07-16 DIAGNOSIS — Z8619 Personal history of other infectious and parasitic diseases: Secondary | ICD-10-CM | POA: Diagnosis present

## 2023-07-16 DIAGNOSIS — K7400 Hepatic fibrosis, unspecified: Secondary | ICD-10-CM | POA: Diagnosis present

## 2023-07-21 ENCOUNTER — Ambulatory Visit: Payer: Self-pay | Admitting: Infectious Diseases

## 2023-07-30 ENCOUNTER — Telehealth: Payer: Self-pay | Admitting: Physical Medicine and Rehabilitation

## 2023-07-30 DIAGNOSIS — M5416 Radiculopathy, lumbar region: Secondary | ICD-10-CM

## 2023-07-30 NOTE — Telephone Encounter (Signed)
 Pt called requesting a call to set an appt for injection with [place]. Pease call pt at [phone]

## 2023-07-30 NOTE — Telephone Encounter (Signed)
 Please let her know that want to try different style injection at same area to see if it will last longer, I will talk to her at that time about the difference. I did put referral in for Left L4 tf esi

## 2023-08-03 ENCOUNTER — Encounter: Payer: Self-pay | Admitting: *Deleted
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
## 2023-08-18 ENCOUNTER — Ambulatory Visit: Payer: MEDICAID

## 2023-08-18 ENCOUNTER — Other Ambulatory Visit: Payer: Self-pay

## 2023-08-18 VITALS — BP 165/103 | HR 71 | Temp 97.8°F | Ht 65.0 in | Wt 186.6 lb

## 2023-08-18 DIAGNOSIS — G8929 Other chronic pain: Secondary | ICD-10-CM

## 2023-08-18 DIAGNOSIS — F4310 Post-traumatic stress disorder, unspecified: Secondary | ICD-10-CM

## 2023-08-18 DIAGNOSIS — F323 Major depressive disorder, single episode, severe with psychotic features: Secondary | ICD-10-CM

## 2023-08-18 DIAGNOSIS — D7589 Other specified diseases of blood and blood-forming organs: Secondary | ICD-10-CM | POA: Diagnosis not present

## 2023-08-18 DIAGNOSIS — M542 Cervicalgia: Secondary | ICD-10-CM | POA: Diagnosis not present

## 2023-08-18 DIAGNOSIS — F410 Panic disorder [episodic paroxysmal anxiety] without agoraphobia: Secondary | ICD-10-CM

## 2023-08-18 DIAGNOSIS — F4000 Agoraphobia, unspecified: Secondary | ICD-10-CM

## 2023-08-18 DIAGNOSIS — G894 Chronic pain syndrome: Secondary | ICD-10-CM

## 2023-08-18 MED ORDER — DULOXETINE HCL 60 MG PO CPEP: 60.0000 mg | ORAL_CAPSULE | Freq: Every day | ORAL | 2 refills | Status: AC

## 2023-08-18 MED ORDER — TIZANIDINE HCL 4 MG PO TABS: 4.0000 mg | ORAL_TABLET | Freq: Once | ORAL | 0 refills | Status: AC

## 2023-08-18 MED ORDER — ATORVASTATIN CALCIUM 80 MG PO TABS: 80.0000 mg | ORAL_TABLET | Freq: Every day | ORAL | 3 refills | Status: AC

## 2023-08-18 NOTE — Patient Instructions (Addendum)
  Dear Ms. Erminio MORTON Michelle,  Thank you for allowing us  to provide your care today. During our consultation, we discussed your neck pain, and we're sorry you're experiencing this discomfort. Upon examination, we noted muscle stiffness, so we have referred you to physical therapy for further treatment. Additionally, we've sent a referral to an orthopedic specialist for further evaluation as well as neck MRI.  We've also started you on a new muscle relaxant, which may cause drowsiness, so please take it at bedtime.  It was a pleasure seeing you today. If you have any questions or need further assistance, please don't hesitate to reach out.  Wishing you relief and recovery soon.  I have ordered the following labs for you:  Lab Orders         CBC no Diff         Vitamin B12         Referrals ordered today:   Referral Orders         Ambulatory referral to Physical Therapy         Ambulatory referral to Orthopedic Surgery       I have ordered the following medication/changed the following medications:   Stop the following medications: Medications Discontinued During This Encounter  Medication Reason   DULoxetine  (CYMBALTA ) 60 MG capsule Reorder   atorvastatin  (LIPITOR ) 80 MG tablet Reorder     Start the following medications: Meds ordered this encounter  Medications   tiZANidine  (ZANAFLEX ) 4 MG tablet    Sig: Take 1 tablet (4 mg total) by mouth once for 1 dose.    Dispense:  1 tablet    Refill:  0   DULoxetine  (CYMBALTA ) 60 MG capsule    Sig: Take 1 capsule (60 mg total) by mouth daily.    Dispense:  60 capsule    Refill:  2    Cannot supply 90 pills at one time due to suicidal ideation.   atorvastatin  (LIPITOR ) 80 MG tablet    Sig: Take 1 tablet by mouth daily.    Dispense:  90 tablet    Refill:  3     Follow up: 3 months   Remember:  Should you have any questions or concerns please call the internal medicine clinic at [phone].    Armando Rossetti, M.D [facility]

## 2023-08-18 NOTE — Assessment & Plan Note (Addendum)
  The patient's last CBC showed an MCV of 100.4. She has  history of vitamin B12 deficiency and is currently taking supplementation. Plan is to recheck CBC and vitamin B12 levels to assess response to treatment.

## 2023-08-18 NOTE — Assessment & Plan Note (Signed)
 The patient presents with chronic neck pain that has progressively worsened over the past year. She describes diffuse pain throughout the neck, especially at the lower cervical region and into the shoulder. She has a known history of left shoulder bursitis and previously received a steroid injection. She also reports right upper extremity numbness and occasional dropping of objects with her right hand. These symptoms are not new and began following a prior stroke. She has previously tried muscle relaxants and physical therapy with only minimal relief.  On exam, there is paraspinal muscle tenderness, stiffness, and limited cervical range of motion due to pain. Spurling's test is negative.  Muscle strength is mildly decreased bilaterally, more pronounced on the right side. Light touch sensation is also decreased bilaterally, especially on the right (positive Hx of CTS). These findings are not new and have been present since the patient's prior cerebrovascular accident (CVA).  Assessment includes cervical musculoskeletal spasm and possible underlying cervical spine pathology such as disc herniation or spinal stenosis.   Plan: initiate physical therapy with consideration of dry needling,  start tizanidine  4 mg at bedtime with counseling on sedation effects (patient agrees to proceed) order a cervical spine MRI without contrast refer to orthopedic surgery for further evaluation.

## 2023-08-19 ENCOUNTER — Telehealth: Payer: Self-pay | Admitting: *Deleted

## 2023-08-19 LAB — CBC
Hematocrit: 41.8 % (ref 34.0–46.6)
Hemoglobin: 13.6 g/dL (ref 11.1–15.9)
MCH: 34 pg — ABNORMAL HIGH (ref 26.6–33.0)
MCHC: 32.5 g/dL (ref 31.5–35.7)
MCV: 105 fL — ABNORMAL HIGH (ref 79–97)
Platelets: 200 x10E3/uL (ref 150–450)
RBC: 4 x10E6/uL (ref 3.77–5.28)
RDW: 13.4 % (ref 11.7–15.4)
WBC: 5.6 x10E3/uL (ref 3.4–10.8)

## 2023-08-19 LAB — VITAMIN B12: Vitamin B-12: 313 pg/mL (ref 232–1245)

## 2023-08-19 NOTE — Telephone Encounter (Signed)
 Copied from CRM [id number]. Topic: Clinical - Prescription Issue >> Aug 19, 2023 10:09 AM Mace SQUIBB wrote: Reason for CRM: Patient was supposed to get a 30 day day supply of muscle relaxer, and pharmacy only had one pill and said that it was not covered please call patient [phone]. She was not aware of the name of the medication.

## 2023-08-19 NOTE — Telephone Encounter (Signed)
 Pt saw Dr Bernadine yesterday 08/18/23.

## 2023-08-20 ENCOUNTER — Telehealth: Payer: Self-pay | Admitting: *Deleted

## 2023-08-20 NOTE — Telephone Encounter (Signed)
 Copied from CRM [id number]. Topic: Clinical - Prescription Issue >> Aug 19, 2023 10:09 AM Mace SQUIBB wrote: Reason for CRM: Patient was supposed to get a 30 day day supply of muscle relaxer, and pharmacy only had one pill and said that it was not covered please call patient [phone]. She was not aware of the name of the medication. >> Aug 20, 2023  8:46 AM Miquel SAILOR wrote: Patient was seen 07/21 and still needs call back on update for muscle relaxer prescriptions. [phone]

## 2023-08-20 NOTE — Progress Notes (Signed)
 Internal Medicine Clinic Attending  I was physically present during the key portions of the resident provided service and participated in the medical decision making of patient's management care. I reviewed pertinent patient test results.  The assessment, diagnosis, and plan were formulated together and I agree with the documentation in the resident's note.  Dickie La, MD

## 2023-08-20 NOTE — Telephone Encounter (Signed)
 Pt stated the doctor she saw (7/21) was going to prescribe a low dose, 30 day supply of a muscle relaxant. Pt stated it was not at the pharmacy. Thanks

## 2023-08-20 NOTE — Telephone Encounter (Signed)
 Will forward to Dr, Azadegan. Copied from CRM [id number]. Topic: Clinical - Prescription Issue >> Aug 19, 2023 10:09 AM Mace SQUIBB wrote: Reason for CRM: Patient was supposed to get a 30 day day supply of muscle relaxer, and pharmacy only had one pill and said that it was not covered please call patient [phone]. She was not aware of the name of the medication. >> Aug 20, 2023  8:46 AM Miquel SAILOR wrote: Patient was seen 07/21 and still needs call back on update for muscle relaxer prescriptions. [phone]

## 2023-08-21 ENCOUNTER — Other Ambulatory Visit: Payer: Self-pay

## 2023-08-21 MED ORDER — TIZANIDINE HCL 4 MG PO TABS: 4.0000 mg | ORAL_TABLET | Freq: Every day | ORAL | 0 refills | Status: DC

## 2023-08-21 NOTE — Telephone Encounter (Signed)
 Per last OV, the plan start tizanidine  4 mg at bedtime with counseling on sedation effects (patient agrees to proceed) . Thanks

## 2023-08-21 NOTE — Telephone Encounter (Signed)
 Called pt of Tizanidine  rx - no answer, left message on pt's vm.

## 2023-08-22 ENCOUNTER — Ambulatory Visit: Payer: Self-pay

## 2023-08-22 DIAGNOSIS — D7589 Other specified diseases of blood and blood-forming organs: Secondary | ICD-10-CM

## 2023-08-26 ENCOUNTER — Other Ambulatory Visit: Payer: Self-pay

## 2023-08-26 ENCOUNTER — Ambulatory Visit: Payer: MEDICAID | Admitting: Physical Medicine and Rehabilitation

## 2023-08-26 VITALS — BP 101/65 | HR 89

## 2023-08-26 DIAGNOSIS — M5416 Radiculopathy, lumbar region: Secondary | ICD-10-CM | POA: Diagnosis not present

## 2023-08-26 MED ORDER — METHYLPREDNISOLONE ACETATE 40 MG/ML IJ SUSP: 40.0000 mg | Freq: Once | INTRAMUSCULAR | Status: AC

## 2023-08-26 NOTE — Progress Notes (Unsigned)
 Pain Scale   Average Pain 9 Patient advising she has lower back pain radiating to both legs and pain is constant.        +Driver, -BT, -Dye Allergies.

## 2023-08-26 NOTE — Patient Instructions (Signed)

## 2023-08-27 ENCOUNTER — Ambulatory Visit: Payer: MEDICAID

## 2023-08-27 NOTE — Procedures (Signed)
 Lumbosacral Transforaminal Epidural Steroid Injection - Sub-Pedicular Approach with Fluoroscopic Guidance  Patient: Mackenzie Gonzalez      Date of Birth: 08-Jan-1961 MRN: [id number] PCP: Kandis Perkins, DO      Visit Date: 08/26/2023   Universal Protocol:    Date/Time: 08/26/2023  Consent Given By: the patient  Position: PRONE  Additional Comments: Vital signs were monitored before and after the procedure. Patient was prepped and draped in the usual sterile fashion. The correct patient, procedure, and site was verified.   Injection Procedure Details:   Procedure diagnoses: Lumbar radiculopathy [M54.16]    Meds Administered:  Meds ordered this encounter  Medications   methylPREDNISolone  acetate (DEPO-MEDROL ) injection 40 mg    Laterality: Left  Location/Site: L4  Needle:5.0 in., 22 ga.  Short bevel or Quincke spinal needle  Needle Placement: Transforaminal  Findings:    -Comments: Excellent flow of contrast along the nerve, nerve root and into the epidural space.  Procedure Details: After squaring off the end-plates to get a true AP view, the C-arm was positioned so that an oblique view of the foramen as noted above was visualized. The target area is just inferior to the nose of the scotty dog or sub pedicular. The soft tissues overlying this structure were infiltrated with 2-3 ml. of 1% Lidocaine  without Epinephrine.  The spinal needle was inserted toward the target using a trajectory view along the fluoroscope beam.  Under AP and lateral visualization, the needle was advanced so it did not puncture dura and was located close the 6 O'Clock position of the pedical in AP tracterory. Biplanar projections were used to confirm position. Aspiration was confirmed to be negative for CSF and/or blood. A 1-2 ml. volume of Isovue-250 was injected and flow of contrast was noted at each level. Radiographs were obtained for documentation purposes.   After attaining the desired flow of  contrast documented above, a 0.5 to 1.0 ml test dose of 0.25% Marcaine was injected into each respective transforaminal space.  The patient was observed for 90 seconds post injection.  After no sensory deficits were reported, and normal lower extremity motor function was noted,   the above injectate was administered so that equal amounts of the injectate were placed at each foramen (level) into the transforaminal epidural space.   Additional Comments:  The patient tolerated the procedure well Dressing: 2 x 2 sterile gauze and Band-Aid    Post-procedure details: Patient was observed during the procedure. Post-procedure instructions were reviewed.  Patient left the clinic in stable condition.

## 2023-08-27 NOTE — Progress Notes (Signed)
 Mackenzie Gonzalez - 63 y.o. female MRN [id number]  Date of birth: [birth year]  Office Visit Note: Visit Date: 08/26/2023 PCP: Kandis Perkins, DO Referred by: Kandis Perkins, DO  Subjective: Chief Complaint  Patient presents with   Lower Back - Pain   HPI:  Mackenzie Gonzalez is a 63 y.o. female who comes in today at the request of Duwaine Pouch, FNP for planned Left L4-5 Lumbar Transforaminal epidural steroid injection with fluoroscopic guidance.  The patient has failed conservative care including home exercise, medications, time and activity modification.  This injection will be diagnostic and hopefully therapeutic.  Please see requesting physician notes for further details and justification.   ROS Otherwise per HPI.  Assessment & Plan: Visit Diagnoses:    ICD-10-CM   1. Lumbar radiculopathy  M54.16 XR C-ARM NO REPORT    Epidural Steroid injection    methylPREDNISolone  acetate (DEPO-MEDROL ) injection 40 mg      Plan: No additional findings.   Meds & Orders:  Meds ordered this encounter  Medications   methylPREDNISolone  acetate (DEPO-MEDROL ) injection 40 mg    Orders Placed This Encounter  Procedures   XR C-ARM NO REPORT   Epidural Steroid injection    Follow-up: Return for visit to requesting provider as needed.   Procedures: No procedures performed  Lumbosacral Transforaminal Epidural Steroid Injection - Sub-Pedicular Approach with Fluoroscopic Guidance  Patient: Mackenzie Gonzalez      Date of Birth: 1960-06-05 MRN: [id number] PCP: Kandis Perkins, DO      Visit Date: 08/26/2023   Universal Protocol:    Date/Time: 08/26/2023  Consent Given By: the patient  Position: PRONE  Additional Comments: Vital signs were monitored before and after the procedure. Patient was prepped and draped in the usual sterile fashion. The correct patient, procedure, and site was verified.   Injection Procedure Details:   Procedure diagnoses: Lumbar radiculopathy [M54.16]    Meds  Administered:  Meds ordered this encounter  Medications   methylPREDNISolone  acetate (DEPO-MEDROL ) injection 40 mg    Laterality: Left  Location/Site: L4  Needle:5.0 in., 22 ga.  Short bevel or Quincke spinal needle  Needle Placement: Transforaminal  Findings:    -Comments: Excellent flow of contrast along the nerve, nerve root and into the epidural space.  Procedure Details: After squaring off the end-plates to get a true AP view, the C-arm was positioned so that an oblique view of the foramen as noted above was visualized. The target area is just inferior to the nose of the scotty dog or sub pedicular. The soft tissues overlying this structure were infiltrated with 2-3 ml. of 1% Lidocaine  without Epinephrine.  The spinal needle was inserted toward the target using a trajectory view along the fluoroscope beam.  Under AP and lateral visualization, the needle was advanced so it did not puncture dura and was located close the 6 O'Clock position of the pedical in AP tracterory. Biplanar projections were used to confirm position. Aspiration was confirmed to be negative for CSF and/or blood. A 1-2 ml. volume of Isovue-250 was injected and flow of contrast was noted at each level. Radiographs were obtained for documentation purposes.   After attaining the desired flow of contrast documented above, a 0.5 to 1.0 ml test dose of 0.25% Marcaine was injected into each respective transforaminal space.  The patient was observed for 90 seconds post injection.  After no sensory deficits were reported, and normal lower extremity motor function was noted,   the above injectate was administered so  that equal amounts of the injectate were placed at each foramen (level) into the transforaminal epidural space.   Additional Comments:  The patient tolerated the procedure well Dressing: 2 x 2 sterile gauze and Band-Aid    Post-procedure details: Patient was observed during the procedure. Post-procedure  instructions were reviewed.  Patient left the clinic in stable condition.    Clinical History: CLINICAL DATA:  Low back pain, symptoms persist with > 6 wks treatment.   EXAM: MRI LUMBAR SPINE WITHOUT CONTRAST   TECHNIQUE: Multiplanar, multisequence MR imaging of the lumbar spine was performed. No intravenous contrast was administered.   COMPARISON:  Lumbar spine radiographs 05/05/2023   FINDINGS: Segmentation:  Standard.   Alignment:  Trace anterolisthesis of L5 on S1.   Vertebrae: No acute fracture or suspicious marrow lesion. Minimal chronic anterior wedging of the T12 vertebral body and mild chronic L4 vertebral body height loss. Marrow edema in the opposing L4 and L5 spinous processes with small cysts suggestive of Baastrup disease.   Conus medullaris and cauda equina: Conus extends to the L1-2 level. Conus and cauda equina appear normal.   Paraspinal and other soft tissues: Unremarkable.   Disc levels:   Disc desiccation throughout the lumbar spine. Mild disc space narrowing from T11-12 through L1-2 and at L5-S1. 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   T11-12: Only imaged sagittally. A left paracentral to left foraminal disc protrusion with annular fissure results in mild spinal stenosis and moderate left neural foraminal stenosis.   T12-L1: A small to moderate-sized left paracentral disc extrusion with mild caudal migration results in minimal to mild left-sided spinal stenosis. Patent neural foramina.   L1-2: Minimal disc bulging and mild facet arthrosis without stenosis.   L2-3: Mild disc bulging and mild facet arthrosis result in mild bilateral lateral recess stenosis and minimal to mild spinal stenosis without neural foraminal stenosis.   L3-4: Mild circumferential disc bulging eccentric to the left and moderate facet arthrosis result in mild bilateral lateral recess stenosis and mild left neural foraminal  stenosis without spinal stenosis.   L4-5: Disc bulging, a left foraminal to extraforaminal disc protrusion with annular fissure, and moderate facet and ligamentum flavum hypertrophy result in mild spinal stenosis, mild right and moderate left lateral recess stenosis, and mild right and moderate left neural foraminal stenosis. The left L4 and L5 nerve roots could be affected.   L5-S1: Trace anterolisthesis with mild bulging of uncovered disc and severe facet and asymmetric left-sided ligamentum flavum hypertrophy result in mild bilateral lateral recess stenosis and mild left neural foraminal stenosis without significant spinal stenosis.   IMPRESSION: 1. Multilevel lumbar disc and facet degeneration. 2. 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 3. Mild spinal stenosis and moderate left neural foraminal stenosis at T11-12.     Electronically Signed   By: Dasie Hamburg M.D.   On: 06/03/2023 16:30     Objective:  VS:  HT:    WT:   BMI:     BP:101/65  HR:89bpm  TEMP: ( )  RESP:  Physical Exam Vitals and nursing note reviewed.  Constitutional:      General: She is not in acute distress.    Appearance: Normal appearance. She is not ill-appearing.  HENT:     Head: Normocephalic and atraumatic.     Right Ear: External ear normal.     Left Ear: External ear  normal.  Eyes:     Extraocular Movements: Extraocular movements intact.  Cardiovascular:     Rate and Rhythm: Normal rate.     Pulses: Normal pulses.  Pulmonary:     Effort: Pulmonary effort is normal. No respiratory distress.  Abdominal:     General: There is no distension.     Palpations: Abdomen is soft.  Musculoskeletal:        General: Tenderness present.     Cervical back: Neck supple.     Right lower leg: No edema.     Left lower leg: No edema.     Comments: Patient has good distal strength with no pain over the  greater trochanters.  No clonus or focal weakness.  Skin:    Findings: No erythema, lesion or rash.  Neurological:     General: No focal deficit present.     Mental Status: She is alert and oriented to person, place, and time.     Sensory: No sensory deficit.     Motor: No weakness or abnormal muscle tone.     Coordination: Coordination normal.  Psychiatric:        Mood and Affect: Mood normal.        Behavior: Behavior normal.      Imaging: No results found.

## 2023-09-02 ENCOUNTER — Ambulatory Visit: Payer: MEDICAID | Admitting: Student

## 2023-09-02 VITALS — BP 163/90 | HR 65 | Temp 97.6°F | Ht 65.0 in | Wt 184.8 lb

## 2023-09-02 DIAGNOSIS — N3946 Mixed incontinence: Secondary | ICD-10-CM | POA: Diagnosis not present

## 2023-09-02 DIAGNOSIS — I10 Essential (primary) hypertension: Secondary | ICD-10-CM

## 2023-09-02 MED ORDER — AMLODIPINE BESYLATE 10 MG PO TABS: 10.0000 mg | ORAL_TABLET | Freq: Every day | ORAL | 11 refills | Status: AC

## 2023-09-02 NOTE — Assessment & Plan Note (Signed)
 Chronic, stable. Uses incontinence supplies long term. I didn't do vaginal exam today but I note history of atrophic vaginitis. She may benefit from a vaginal estrogen. Will complete faxed order for her incontinence supplies and talk about vaginal estrogen at follow-up.

## 2023-09-02 NOTE — Progress Notes (Signed)
  Patient name: Mackenzie Gonzalez Date of birth: [birth year] Date of visit: 09/02/23  Subjective   Chief concern: peeing on myself  Years of mixed, stress predominant, urinary incontinence.  Status post cystoscopy with Bulkamid in 2023 without much effect on her symptoms.  She's here today to talk about incontinence supplies.   Review of Systems  Genitourinary:  Positive for frequency and urgency. Negative for dysuria and hematuria.       Urinary leakage with coughing and sneezing    Current Outpatient Medications  Medication Instructions   albuterol  (VENTOLIN  HFA) 108 (90 Base) MCG/ACT inhaler 2 puffs, Inhalation, Every 6 hours PRN   amLODipine  (NORVASC ) 10 mg, Oral, Daily   aspirin  81 mg, Oral, Daily   atorvastatin  (LIPITOR ) 80 MG tablet Take 1 tablet by mouth daily.   bictegravir-emtricitabine -tenofovir  AF (BIKTARVY ) 50-200-25 MG TABS tablet 1 tablet, Oral, Daily   dicyclomine  (BENTYL ) 20 mg, Oral, 3 times daily before meals & bedtime   DULoxetine  (CYMBALTA ) 60 mg, Oral, Daily   gabapentin  (NEURONTIN ) 300 mg, Oral, Daily at bedtime   pantoprazole  (PROTONIX ) 40 mg, Oral, 2 times daily   QUEtiapine  (SEROQUEL ) 400 mg, Oral, Daily at bedtime   tiZANidine  (ZANAFLEX ) 4 mg, Oral, Daily at bedtime   valACYclovir  (VALTREX ) 1,000 mg, Oral, 2 times daily     Objective  Today's Vitals   09/02/23 0823 09/02/23 0858  BP: (!) 165/98 (!) 163/90  Pulse: 73 65  Temp: 97.6 F (36.4 C)   TempSrc: Oral   SpO2: 97%   Weight: 184 lb 12.8 oz (83.8 kg)   Height: 5' 5 (1.651 m)   Body mass index is 30.75 kg/m.   Physical Exam Constitutional:      Appearance: Normal appearance.  Cardiovascular:     Rate and Rhythm: Normal rate and regular rhythm.     Heart sounds: No murmur heard. Pulmonary:     Effort: Pulmonary effort is normal. No respiratory distress.  Musculoskeletal:     Right lower leg: No edema.     Left lower leg: No edema.  Skin:    General: Skin is warm and dry.   Neurological:     Mental Status: She is alert.     Cranial Nerves: No facial asymmetry.  Psychiatric:        Mood and Affect: Affect normal.        Speech: Speech normal.        Behavior: Behavior normal.      Assessment & Plan   Mixed stress and urge urinary incontinence Assessment & Plan: Chronic, stable. Uses incontinence supplies long term. I didn't do vaginal exam today but I note history of atrophic vaginitis. She may benefit from a vaginal estrogen. Will complete faxed order for her incontinence supplies and talk about vaginal estrogen at follow-up.   Primary hypertension Assessment & Plan: BP Readings from Last 3 Encounters:  09/02/23 (!) 163/90  08/26/23 101/65  08/18/23 (!) 165/103   Newly diagnosed. Hypertension with ASCVD and history of stroke on daily aspirin  and high intensity statin. Last serum chemistry was routine. No CKD or diabetes. Start amlodipine  10 mg daily.  Orders: -     amLODIPine  Besylate; Take 1 tablet (10 mg total) by mouth daily.  Dispense: 30 tablet; Refill: 11    Return in about 2 weeks (around 09/16/2023) for blood pressure.  Ozell Kung MD 09/02/2023, 3:39 PM

## 2023-09-02 NOTE — Assessment & Plan Note (Addendum)
 BP Readings from Last 3 Encounters:  09/02/23 (!) 163/90  08/26/23 101/65  08/18/23 (!) 165/103   Newly diagnosed. Hypertension with ASCVD and history of stroke on daily aspirin  and high intensity statin. Last serum chemistry was routine. No CKD or diabetes. Start amlodipine  10 mg daily.

## 2023-09-02 NOTE — Patient Instructions (Addendum)
 Return in about 2 weeks (around 09/16/2023) for blood pressure.  Track your blood pressure at home and come back in two weeks so we can go over results.  Start taking amlodipine  10 mg daily.  Remember to bring all of the medications that you take (including over the counter medications and supplements) with you to every clinic visit.  This after visit summary is an important review of tests, referrals, and medication changes that were discussed during your visit. If you have questions or concerns, call [phone]. Outside of clinic business hours, call the main hospital at [phone] and ask the operator for the on-call internal medicine resident.   Ozell Kung MD 09/02/2023, 8:45 AM

## 2023-09-03 ENCOUNTER — Ambulatory Visit: Payer: MEDICAID | Admitting: Physical Medicine and Rehabilitation

## 2023-09-03 ENCOUNTER — Encounter: Payer: Self-pay | Admitting: Physical Medicine and Rehabilitation

## 2023-09-03 DIAGNOSIS — R202 Paresthesia of skin: Secondary | ICD-10-CM | POA: Diagnosis not present

## 2023-09-03 DIAGNOSIS — M7918 Myalgia, other site: Secondary | ICD-10-CM

## 2023-09-03 DIAGNOSIS — M5412 Radiculopathy, cervical region: Secondary | ICD-10-CM | POA: Diagnosis not present

## 2023-09-03 DIAGNOSIS — M4802 Spinal stenosis, cervical region: Secondary | ICD-10-CM

## 2023-09-03 NOTE — Progress Notes (Signed)
 Internal Medicine Clinic Attending  Case discussed with the resident at the time of the visit.  We reviewed the resident's history and exam and pertinent patient test results.  I agree with the assessment, diagnosis, and plan of care documented in the resident's note.
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
## 2023-09-03 NOTE — Progress Notes (Signed)
 Pain Scale   Average Pain 9 Patient advising she has chronic neck pain radiating to bilateral shoulders with any relief.        +Driver, -BT, -Dye Allergies.

## 2023-09-12 ENCOUNTER — Telehealth: Payer: MEDICAID | Admitting: *Deleted

## 2023-09-12 NOTE — Telephone Encounter (Signed)
 Copied from CRM [id number]. Topic: Clinical - Medication Prior Auth >> Sep 12, 2023  8:51 AM Alfonso ORN wrote: Reason for CRM: sueanne called from   [facility]ervice center  patient is schedule on 09/16/23 for MRI , the issue patient  need a prior authorization and is insurance on file  call back# [phone] ,[place] [phone]

## 2023-09-15 ENCOUNTER — Telehealth: Payer: Self-pay | Admitting: *Deleted

## 2023-09-15 NOTE — Telephone Encounter (Unsigned)
 Copied from CRM [id number]. Topic: Clinical - Medication Prior Auth >> Sep 15, 2023 10:10 AM Cherylann RAMAN wrote: Reason for CRM: Lolita called from [facility]  Patient is schedule on 09/16/23 for MRI ,  Patient's insurance requires a prior authorization   Patient's insurance is [facility] and PA will go through [employer]   Please contact Lolita at call back# [phone] ,[place]enision is [phone]

## 2023-09-16 ENCOUNTER — Ambulatory Visit: Admission: RE | Admit: 2023-09-16 | Payer: MEDICAID | Source: Ambulatory Visit

## 2023-09-17 ENCOUNTER — Encounter: Payer: MEDICAID | Admitting: Student

## 2023-09-24 ENCOUNTER — Other Ambulatory Visit: Payer: Self-pay | Admitting: *Deleted

## 2023-09-26 MED ORDER — TIZANIDINE HCL 4 MG PO TABS: 4.0000 mg | ORAL_TABLET | Freq: Every day | ORAL | 0 refills | Status: AC

## 2023-09-30 NOTE — Telephone Encounter (Signed)
 Call to patient to make  her aware that she will need an appt prior to any additional refills on the tizanidine . Pt states she doesn't need it because her insurance isn't paying for it anyway. No further action needed, phone call complete.Malinda Mayden Cassady9/2/20251:01 PM

## 2023-10-07 ENCOUNTER — Encounter: Payer: Self-pay | Admitting: Student

## 2023-10-07 ENCOUNTER — Ambulatory Visit: Payer: MEDICAID | Admitting: Student

## 2023-10-07 ENCOUNTER — Other Ambulatory Visit: Payer: Self-pay

## 2023-10-07 VITALS — BP 131/84 | HR 69 | Temp 98.2°F | Ht 65.0 in | Wt 187.2 lb

## 2023-10-07 DIAGNOSIS — I10 Essential (primary) hypertension: Secondary | ICD-10-CM | POA: Diagnosis not present

## 2023-10-07 DIAGNOSIS — J410 Simple chronic bronchitis: Secondary | ICD-10-CM

## 2023-10-07 DIAGNOSIS — J439 Emphysema, unspecified: Secondary | ICD-10-CM | POA: Diagnosis not present

## 2023-10-07 DIAGNOSIS — F17210 Nicotine dependence, cigarettes, uncomplicated: Secondary | ICD-10-CM | POA: Diagnosis not present

## 2023-10-07 DIAGNOSIS — F319 Bipolar disorder, unspecified: Secondary | ICD-10-CM

## 2023-10-07 DIAGNOSIS — F17200 Nicotine dependence, unspecified, uncomplicated: Secondary | ICD-10-CM

## 2023-10-07 DIAGNOSIS — M542 Cervicalgia: Secondary | ICD-10-CM

## 2023-10-07 DIAGNOSIS — Z720 Tobacco use: Secondary | ICD-10-CM

## 2023-10-07 DIAGNOSIS — R062 Wheezing: Secondary | ICD-10-CM

## 2023-10-07 MED ORDER — PANTOPRAZOLE SODIUM 40 MG PO TBEC: 40.0000 mg | DELAYED_RELEASE_TABLET | Freq: Two times a day (BID) | ORAL | 3 refills | Status: AC

## 2023-10-07 MED ORDER — FLUTICASONE PROPIONATE 50 MCG/ACT NA SUSP
1.0000 | Freq: Every day | NASAL | 0 refills | Status: AC
Start: 2023-10-07 — End: ?

## 2023-10-07 MED ORDER — ALBUTEROL SULFATE HFA 108 (90 BASE) MCG/ACT IN AERS: 2.0000 | INHALATION_SPRAY | Freq: Four times a day (QID) | RESPIRATORY_TRACT | 0 refills | Status: DC | PRN

## 2023-10-07 MED ORDER — VARENICLINE TARTRATE 0.5 MG PO TABS: ORAL_TABLET | ORAL | 0 refills | Status: AC

## 2023-10-07 NOTE — Progress Notes (Unsigned)
 Established Patient Office Visit  Subjective   Patient ID: Mackenzie Gonzalez, female    DOB: 1961/01/28  Age: 63 y.o. MRN: [id number]  Chief Complaint  Patient presents with   Follow-up   Medication Refill   Mackenzie Gonzalez is a 63 y.o. who presents to the clinic for follow-up of neck pain, hypertension, wheezing. Please see problem based assessment and plan for additional details.   Patient Active Problem List   Diagnosis Date Noted   Wheezing 10/08/2023   Primary hypertension 09/02/2023   Macrocytosis 08/18/2023   Chronic abdominal pain 04/07/2023   Aortic atherosclerosis (HCC) 04/04/2023   Emphysema of lung (HCC) 04/04/2023   Neck pain 02/11/2023   Borderline personality disorder (HCC) 09/19/2022   History of suicide attempts 09/19/2022   Agoraphobia 09/19/2022   Major depressive disorder, single episode, severe with psychotic features rule out bipolar 2 disorder 09/19/2022   Chronic back/knee/hip/feet/wrists pain 09/19/2022   Psychophysiologic insomnia with snoring 09/19/2022   Liver fibrosis 09/16/2022   Hyperlipidemia 12/11/2021   Tubular adenoma of colon 03/23/2021   Hypoalbuminemia due to protein-calorie malnutrition (HCC)    Bipolar disorder (HCC)    Diverticulosis of colon with hemorrhage    Hemiparesis affecting left side as late effect of stroke (HCC)    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   Generalized anxiety disorder with panic attacks    Mixed stress and urge urinary incontinence 03/16/2020   Pain in joint involving multiple sites 12/15/2019   Encounter for preventive care 12/15/2019   PTSD (post-traumatic stress disorder) 07/12/2019   Chronic pain 05/25/2019   History of hepatitis C 04/20/2019   Tobacco abuse 04/20/2019   Schizoaffective disorder, bipolar type (HCC) 04/20/2019   HIV (human immunodeficiency virus infection) (HCC) 04/20/2019   Esophagitis, [place] grade D 2015       Objective:     BP 131/84 (BP Location:  Right Arm, Patient Position: Sitting, Cuff Size: Large)   Pulse 69   Temp 98.2 F (36.8 C) (Oral)   Ht 5' 5 (1.651 m)   Wt 187 lb 3.2 oz (84.9 kg)   SpO2 100%   BMI 31.15 kg/m  BP Readings from Last 3 Encounters:  10/07/23 131/84  09/02/23 (!) 163/90  08/26/23 101/65   Wt Readings from Last 3 Encounters:  10/07/23 187 lb 3.2 oz (84.9 kg)  09/02/23 184 lb 12.8 oz (83.8 kg)  08/18/23 186 lb 9.6 oz (84.6 kg)      Physical Exam Vitals reviewed.  Constitutional:      General: She is not in acute distress.    Appearance: She is not ill-appearing, toxic-appearing or diaphoretic.  Cardiovascular:     Rate and Rhythm: Normal rate and regular rhythm.     Heart sounds: No murmur heard. Pulmonary:     Effort: Pulmonary effort is normal. No respiratory distress.     Breath sounds: Normal breath sounds. No wheezing or rales.  Musculoskeletal:     Comments: Bilateral trapezius muscle tenderness and tightness to palpation Inferior cervical to the superior thoracic midline tenderness to palpation  Skin:    General: Skin is warm and dry.  Neurological:     Mental Status: She is alert.     Cranial Nerves: No dysarthria or facial asymmetry.     Motor: Weakness (RLE 3/5 weakness ( chronic)) present.     Last metabolic panel Lab Results  Component Value Date   GLUCOSE 80 10/07/2023   NA 143 10/07/2023   K 4.3  10/07/2023   CL 107 (H) 10/07/2023   CO2 18 (L) 10/07/2023   BUN 17 10/07/2023   CREATININE 0.83 10/07/2023   EGFR 80 10/07/2023   CALCIUM  9.6 10/07/2023   PROT 7.5 06/24/2023   ALBUMIN  4.4 04/30/2022   LABGLOB 2.5 04/30/2022   AGRATIO 1.8 04/30/2022   BILITOT 0.5 06/24/2023   ALKPHOS 95 04/30/2022   AST 15 06/24/2023   ALT 18 06/24/2023   ALT 19 06/24/2023   ANIONGAP 10 09/22/2020      The ASCVD Risk score (Arnett DK, et al., 2019) failed to calculate for the following reasons:   Risk score cannot be calculated because patient has a medical history suggesting  prior/existing ASCVD    Assessment & Plan:   Problem List Items Addressed This Visit       Cardiovascular and Mediastinum   Primary hypertension - Primary (Chronic)   Patient presents with a history of hypertension with a blood pressure today of 131/84. Their hypertension is controlled on a regimen of amlodipine  10mg .  Prior BMP in October 2024, Scr was 0.80.   Plan: -Continue current regimen of amlodipine  10 mg  -BMP today       Relevant Orders   Basic metabolic panel with GFR (Completed)     Respiratory   Emphysema of lung (HCC) (Chronic)   Patient has a history of emphysema on imaging.  PFTs obtained in 2024 do not show obstructive lung disease.      Relevant Medications   fluticasone  (FLONASE ) 50 MCG/ACT nasal spray   varenicline  (CHANTIX ) 0.5 MG tablet     Other   Tobacco abuse   Patient reports interest in smoking cessation.  Patient was provided instructions and prescription for Chantix .      Bipolar disorder [facility])   Patient follows with psychiatry.  Her PHQ-9 score was 17.  Her next appointment with Dr. Karolynn is in November.  She denied SI/HI.  Patient is not acutely manic or showing other indications for hospitalization at this point.      Neck pain   Patient continues to have midline neck pain since she was last evaluated in July.  She reports that she has not gone to physical therapy yet.  She has gone to Ortho care and they recommended her to go through with the MRI, patient has not scheduled the MRI due to receiving a letter that it was denied.  Per patient, she was able to pick up the Zanaflex  yesterday and has helped her some.  She reported that her neck pain did temporarily improve while she was exercising in her friend's pool.  She denies worsening of her neck pain over the past 6 weeks.  She denies urinary incontinence, fecal incontinence, saddle anesthesia, or changes in her chronic weakness (chronic right leg weakness).  On exam, she does have tenderness to  palpation of the midline cervical and upper thoracic vertebrae.  There is some tightness and pain present on the bilateral trapezius muscles.  Plan: - Physical therapy referral sent, patient needs to complete 6 weeks of treatment for insurance company to pay for MRI  - Will continue Zanaflex  for the time being -Patient was told to follow back up in 6 weeks if the pain continues and we can order the MRI      Relevant Orders   Ambulatory referral to Physical Therapy   Wheezing   Patient reports wheezing only present at night when she is lying down.  She denies any shortness of breath.  She does have an albuterol  inhaler that she was provided after an episode of bronchitis.  She reports that she uses her albuterol  inhaler almost daily for the wheezing that happens at night.  On exam, her lungs are clear to auscultate bilaterally and her oxygen saturation is 100%.  Patient underwent PFTs in 2024 which did not show obstructive lung disease at that time. Patient has heartburn which she takes Protonix  40 mg twice daily.  With the wheezing being present at night while she is laying flat, I do wonder if the GERD is the cause of the wheezing. Plan: - Will discontinue albuterol  inhaler at this point, as the risk outweigh the benefits - Will continue Protonix  - Will try Flonase  to see if that helps with the wheezing due to the possibility of postnasal drip.       Relevant Medications   pantoprazole  (PROTONIX ) 40 MG tablet   fluticasone  (FLONASE ) 50 MCG/ACT nasal spray   Other Visit Diagnoses       Smoker       Relevant Medications   varenicline  (CHANTIX ) 0.5 MG tablet       Return in about 3 months (around 01/06/2024) for Or sooner if neck pain persists .    Damien Lease, DO

## 2023-10-07 NOTE — Patient Instructions (Signed)
 Thank you, [name] for allowing us  to provide your care today. Today we discussed neck pain, smoking, wheezing, and blood pressure.    For the neck pain, please start physical therpay and after 6 weeks we can order a MRI of the neck  For the smoking cessation: Please select a quit date for about one week after starting Chantix . For Days 1-3, please take one tablet (0.5 mg) once a day   For Days 4-7, please take one tablet (0.5 mg) twice a day (one in the morning and one in the evening)   From Day 8 and on, please take 2 tablets (1 mg) twice a day one in the morning and one in the evening)   You will continue this medication for about a total of 11- 12 weeks.     Use flonase  (nose spray) and continue the Protonix  for wheezing, you do not have COPD so please stop using the albuterol  inhaler.  I have ordered the following labs for you:   Lab Orders         Basic metabolic panel with GFR      Referrals ordered today:    Referral Orders         Ambulatory referral to Physical Therapy      I have ordered the following medication/changed the following medications:   Stop the following medications: Medications Discontinued During This Encounter  Medication Reason   albuterol  (VENTOLIN  HFA) 108 (90 Base) MCG/ACT inhaler Reorder   pantoprazole  (PROTONIX ) 40 MG tablet Reorder   albuterol  (VENTOLIN  HFA) 108 (90 Base) MCG/ACT inhaler      Start the following medications: Meds ordered this encounter  Medications   pantoprazole  (PROTONIX ) 40 MG tablet    Sig: Take 1 tablet (40 mg total) by mouth 2 (two) times daily.    Dispense:  180 tablet    Refill:  3   DISCONTD: albuterol  (VENTOLIN  HFA) 108 (90 Base) MCG/ACT inhaler    Sig: Inhale 2 puffs into the lungs every 6 (six) hours as needed for wheezing or shortness of breath.    Dispense:  8.5 g    Refill:  0   fluticasone  (FLONASE ) 50 MCG/ACT nasal spray    Sig: Place 1 spray into both nostrils daily.    Dispense:  18.2 mL     Refill:  0   varenicline  (CHANTIX ) 0.5 MG tablet    Sig: Take 1 tablet (0.5 mg total) by mouth daily for 3 days, THEN 1 tablet (0.5 mg total) 2 (two) times daily for 4 days, THEN 2 tablets (1 mg total) 2 (two) times daily.    Dispense:  291 tablet    Refill:  0     Follow up: 3 months     Should you have any questions or concerns please call the internal medicine clinic at [phone].     Please note that our late policy has changed.  If you are more than 15 minutes late to your appointment, you may be asked to reschedule your appointment.  Dr. Kandis, D.O. [facility]

## 2023-10-08 ENCOUNTER — Ambulatory Visit: Payer: Self-pay | Admitting: Student

## 2023-10-08 DIAGNOSIS — R062 Wheezing: Secondary | ICD-10-CM | POA: Insufficient documentation

## 2023-10-08 LAB — BASIC METABOLIC PANEL WITH GFR
BUN/Creatinine Ratio: 20 (ref 12–28)
BUN: 17 mg/dL (ref 8–27)
CO2: 18 mmol/L — ABNORMAL LOW (ref 20–29)
Calcium: 9.6 mg/dL (ref 8.7–10.3)
Chloride: 107 mmol/L — ABNORMAL HIGH (ref 96–106)
Creatinine, Ser: 0.83 mg/dL (ref 0.57–1.00)
Glucose: 80 mg/dL (ref 70–99)
Potassium: 4.3 mmol/L (ref 3.5–5.2)
Sodium: 143 mmol/L (ref 134–144)
eGFR: 80 mL/min/1.73 (ref >59)

## 2023-10-08 NOTE — Assessment & Plan Note (Signed)
 Patient has a history of emphysema on imaging.  PFTs obtained in 2024 do not show obstructive lung disease.

## 2023-10-08 NOTE — Assessment & Plan Note (Signed)
 Patient reports wheezing only present at night when she is lying down.  She denies any shortness of breath.  She does have an albuterol  inhaler that she was provided after an episode of bronchitis.  She reports that she uses her albuterol  inhaler almost daily for the wheezing that happens at night.  On exam, her lungs are clear to auscultate bilaterally and her oxygen saturation is 100%.  Patient underwent PFTs in 2024 which did not show obstructive lung disease at that time. Patient has heartburn which she takes Protonix  40 mg twice daily.  With the wheezing being present at night while she is laying flat, I do wonder if the GERD is the cause of the wheezing. Plan: - Will discontinue albuterol  inhaler at this point, as the risk outweigh the benefits - Will continue Protonix  - Will try Flonase  to see if that helps with the wheezing due to the possibility of postnasal drip.

## 2023-10-08 NOTE — Assessment & Plan Note (Signed)
 Patient presents with a history of hypertension with a blood pressure today of 131/84. Their hypertension is controlled on a regimen of amlodipine  10mg .  Prior BMP in October 2024, Scr was 0.80.   Plan: -Continue current regimen of amlodipine  10 mg  -BMP today

## 2023-10-08 NOTE — Assessment & Plan Note (Addendum)
 Patient follows with psychiatry.  Her PHQ-9 score was 17.  Her next appointment with Dr. Karolynn is in November.  She denied SI/HI.  Patient is not acutely manic or showing other indications for hospitalization at this point.

## 2023-10-08 NOTE — Assessment & Plan Note (Signed)
 Patient reports interest in smoking cessation.  Patient was provided instructions and prescription for Chantix .

## 2023-10-08 NOTE — Assessment & Plan Note (Addendum)
 Patient continues to have midline neck pain since she was last evaluated in July.  She reports that she has not gone to physical therapy yet.  She has gone to Ortho care and they recommended her to go through with the MRI, patient has not scheduled the MRI due to receiving a letter that it was denied.  Per patient, she was able to pick up the Zanaflex  yesterday and has helped her some.  She reported that her neck pain did temporarily improve while she was exercising in her friend's pool.  She denies worsening of her neck pain over the past 6 weeks.  She denies urinary incontinence, fecal incontinence, saddle anesthesia, or changes in her chronic weakness (chronic right leg weakness).  On exam, she does have tenderness to palpation of the midline cervical and upper thoracic vertebrae.  There is some tightness and pain present on the bilateral trapezius muscles.  Plan: - Physical therapy referral sent, patient needs to complete 6 weeks of treatment for insurance company to pay for MRI  - Will continue Zanaflex  for the time being -Patient was told to follow back up in 6 weeks if the pain continues and we can order the MRI

## 2023-10-13 NOTE — Progress Notes (Signed)
 Internal Medicine Clinic Attending  Case discussed with the resident at the time of the visit.  We reviewed the resident's history and exam and pertinent patient test results.  I agree with the assessment, diagnosis, and plan of care documented in the resident's note.

## 2023-12-01 ENCOUNTER — Encounter: Payer: Self-pay | Admitting: Radiology

## 2023-12-16 ENCOUNTER — Other Ambulatory Visit: Payer: Self-pay

## 2023-12-16 ENCOUNTER — Encounter: Payer: Self-pay | Admitting: Infectious Diseases

## 2023-12-16 ENCOUNTER — Ambulatory Visit: Payer: MEDICAID | Admitting: Infectious Diseases

## 2023-12-16 DIAGNOSIS — B20 Human immunodeficiency virus [HIV] disease: Secondary | ICD-10-CM

## 2023-12-16 DIAGNOSIS — Z79899 Other long term (current) drug therapy: Secondary | ICD-10-CM

## 2023-12-16 DIAGNOSIS — E785 Hyperlipidemia, unspecified: Secondary | ICD-10-CM

## 2023-12-16 DIAGNOSIS — B001 Herpesviral vesicular dermatitis: Secondary | ICD-10-CM

## 2023-12-16 DIAGNOSIS — B009 Herpesviral infection, unspecified: Secondary | ICD-10-CM | POA: Diagnosis not present

## 2023-12-16 MED ORDER — BIKTARVY 50-200-25 MG PO TABS: 1.0000 | ORAL_TABLET | Freq: Every day | ORAL | 3 refills | Status: AC

## 2023-12-16 MED ORDER — VALACYCLOVIR HCL 1 G PO TABS: 1000.0000 mg | ORAL_TABLET | Freq: Two times a day (BID) | ORAL | 3 refills | Status: AC
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
## 2023-12-16 NOTE — Patient Instructions (Signed)
 VISIT SUMMARY:  Today, you came in for a routine follow-up visit. We discussed your current medications and overall health status. You are doing well with your HIV treatment, and your other conditions are stable. We also reviewed your history of stroke, broken arms, and diverticulitis, and addressed your concerns about Chantix .  YOUR PLAN:  -HUMAN IMMUNODEFICIENCY VIRUS (HIV) DISEASE:  Your condition is well-managed with Biktarvy , which you should continue to take once daily. We have ordered blood work to monitor your HIV status and scheduled a follow-up in 9 months.  -HERPES SIMPLEX VIRUS INFECTION: Herpes simplex virus causes flare-ups that can be managed with medication. You should continue to take Valtrex  as needed during flare-ups. Your prescription has been updated to reflect this.  -HYPERLIPIDEMIA: Hyperlipidemia means you have high cholesterol levels. You should continue taking atorvastatin  (Lipitor ) to manage your cholesterol. No new issues were reported with this medication.  INSTRUCTIONS:  Please continue taking your medications as prescribed. We have ordered blood work to monitor your HIV status, and you should come back for a follow-up visit in 9 months.*Will need to update your cervical cancer screening with pap smear at this appointment

## 2023-12-17 LAB — T-HELPER CELLS (CD4) COUNT (NOT AT ARMC)
CD4 % Helper T Cell: 42 % (ref 33–65)
CD4 T Cell Abs: 635 /uL (ref 400–1790)

## 2023-12-18 LAB — HIV-1 RNA QUANT-NO REFLEX-BLD
HIV 1 RNA Quant: 35 {copies}/mL — ABNORMAL HIGH
HIV-1 RNA Quant, Log: 1.54 {Log_copies}/mL — ABNORMAL HIGH

## 2023-12-19 ENCOUNTER — Ambulatory Visit: Payer: Self-pay | Admitting: Infectious Diseases

## 2024-02-25 ENCOUNTER — Ambulatory Visit: Payer: Self-pay

## 2024-02-25 ENCOUNTER — Encounter: Payer: Self-pay | Admitting: Student

## 2024-02-25 NOTE — Telephone Encounter (Signed)
 FYI Only or Action Required?: FYI only for provider: appointment scheduled on 02/27/2024.  Patient was last seen in primary care on 10/07/2023 by Kandis Perkins, DO.  Called Nurse Triage reporting Back Pain.  Symptoms began several weeks ago.  Interventions attempted: OTC medications: tylenol .  Symptoms are: gradually improving.  Triage Disposition: See PCP When Office is Open (Within 3 Days)  Patient/caregiver understands and will follow disposition?: Yes  Message from Alfonso ORN sent at 02/25/2024  8:59 AM EST  Summary: severe pain in lower back   Reason for Triage: patient stated her lower back pain severe 2 weeks , the pain was coming and going now its steady pain Hard time walking         Reason for Disposition  [1] Pain radiates into the thigh or further down the leg AND [2] one leg  Answer Assessment - Initial Assessment Questions 1. ONSET: When did the pain begin? (e.g., minutes, hours, days)     Two weeks ago 2. LOCATION: Where does it hurt? (upper, mid or lower back)     Left low back 3. SEVERITY: How bad is the pain?  (e.g., Scale 1-10; mild, moderate, or severe)     8/10 4. PATTERN: Is the pain constant? (e.g., yes, no; constant, intermittent)      constant 5. RADIATION: Does the pain shoot into your legs or somewhere else?     Radiates into buttocks and down to leg.  Reports that she already has weakness due to past stroke 6. CAUSE:  What do you think is causing the back pain?      Recurrent chronic back pain 7. BACK OVERUSE:  Any recent lifting of heavy objects, strenuous work or exercise?     No activities out of the ordinary 8. MEDICINES: What have you taken so far for the pain? (e.g., nothing, acetaminophen , NSAIDS)     Tylenol , no relief 9. NEUROLOGIC SYMPTOMS: Do you have any weakness, numbness, or problems with bowel/bladder control?     No new issues  Protocols used: Back Pain-A-AH

## 2024-02-25 NOTE — Telephone Encounter (Signed)
 Pt has an appt Friday 1/30 with Dr Kandis.

## 2024-02-27 ENCOUNTER — Ambulatory Visit: Payer: Self-pay | Admitting: Student

## 2024-02-27 NOTE — Progress Notes (Unsigned)
" ° °  Established Patient Office Visit  Subjective   Patient ID: Mackenzie Gonzalez, female    DOB: Jun 19, 1960  Age: 64 y.o. MRN: [id number]  No chief complaint on file.   Mackenzie Gonzalez is a 64 y.o. who presents to the clinic for ***. Please see problem based assessment and plan for additional details.   Acute visit for reoccurring back pain Chronic lower back pain s/p epidural injection in July  Plan: -go back to that provider -PT -Muscle relaxers?  -Pain clinic referral.   Patient Active Problem List   Diagnosis Date Noted   Wheezing 10/08/2023   Primary hypertension 09/02/2023   Macrocytosis 08/18/2023   Chronic abdominal pain 04/07/2023   Aortic atherosclerosis 04/04/2023   Emphysema of lung (HCC) 04/04/2023   Neck pain 02/11/2023   Borderline personality disorder (HCC) 09/19/2022   History of suicide attempts 09/19/2022   Agoraphobia 09/19/2022   Major depressive disorder, single episode, severe with psychotic features rule out bipolar 2 disorder 09/19/2022   Chronic back/knee/hip/feet/wrists pain 09/19/2022   Psychophysiologic insomnia with snoring 09/19/2022   Liver fibrosis 09/16/2022   Hyperlipidemia 12/11/2021   Tubular adenoma of colon 03/23/2021   Hypoalbuminemia due to protein-calorie malnutrition    Bipolar disorder (HCC)    Diverticulosis of colon with hemorrhage    Hemiparesis affecting left side as late effect of stroke (HCC)    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   Generalized anxiety disorder with panic attacks    Mixed stress and urge urinary incontinence 03/16/2020   Pain in joint involving multiple sites 12/15/2019   Encounter for preventive care 12/15/2019   PTSD (post-traumatic stress disorder) 07/12/2019   Chronic pain 05/25/2019   History of hepatitis C 04/20/2019   Tobacco abuse 04/20/2019   Schizoaffective disorder, bipolar type (HCC) 04/20/2019   HIV (human immunodeficiency virus infection) (HCC) 04/20/2019   Esophagitis,  [place] grade D 2015      ROS    Objective:     There were no vitals taken for this visit. BP Readings from Last 3 Encounters:  12/16/23 (!) 145/96  10/07/23 131/84  09/02/23 (!) 163/90   Wt Readings from Last 3 Encounters:  12/16/23 179 lb (81.2 kg)  10/07/23 187 lb 3.2 oz (84.9 kg)  09/02/23 184 lb 12.8 oz (83.8 kg)      Physical Exam   No results found for any visits on 02/27/24.  Last metabolic panel Lab Results  Component Value Date   GLUCOSE 80 10/07/2023   NA 143 10/07/2023   K 4.3 10/07/2023   CL 107 (H) 10/07/2023   CO2 18 (L) 10/07/2023   BUN 17 10/07/2023   CREATININE 0.83 10/07/2023   EGFR 80 10/07/2023   CALCIUM  9.6 10/07/2023   PROT 7.5 06/24/2023   ALBUMIN  4.4 04/30/2022   LABGLOB 2.5 04/30/2022   AGRATIO 1.8 04/30/2022   BILITOT 0.5 06/24/2023   ALKPHOS 95 04/30/2022   AST 15 06/24/2023   ALT 18 06/24/2023   ALT 19 06/24/2023   ANIONGAP 10 09/22/2020      The ASCVD Risk score (Arnett DK, et al., 2019) failed to calculate for the following reasons:   Risk score cannot be calculated because patient has a medical history suggesting prior/existing ASCVD   * - Cholesterol units were assumed    Assessment & Plan:   Problem List Items Addressed This Visit   None   No follow-ups on file.    Damien Lease, DO  "

## 2024-09-14 ENCOUNTER — Ambulatory Visit: Payer: MEDICAID | Admitting: Infectious Diseases
